# Patient Record
Sex: Female | Born: 1945 | Race: Black or African American | Hispanic: No | Marital: Single | Smoking: Never smoker
Health system: Southern US, Community
[De-identification: ages and names within clinical notes are randomized; demographics above are authoritative.]

## PROBLEM LIST (undated history)

## (undated) DIAGNOSIS — M1990 Unspecified osteoarthritis, unspecified site: Secondary | ICD-10-CM

## (undated) DIAGNOSIS — Z803 Family history of malignant neoplasm of breast: Secondary | ICD-10-CM

## (undated) DIAGNOSIS — D649 Anemia, unspecified: Secondary | ICD-10-CM

## (undated) DIAGNOSIS — E785 Hyperlipidemia, unspecified: Secondary | ICD-10-CM

## (undated) DIAGNOSIS — C801 Malignant (primary) neoplasm, unspecified: Secondary | ICD-10-CM

## (undated) DIAGNOSIS — Z853 Personal history of malignant neoplasm of breast: Secondary | ICD-10-CM

## (undated) DIAGNOSIS — C50919 Malignant neoplasm of unspecified site of unspecified female breast: Secondary | ICD-10-CM

## (undated) DIAGNOSIS — I10 Essential (primary) hypertension: Secondary | ICD-10-CM

## (undated) HISTORY — DX: Malignant (primary) neoplasm, unspecified: C80.1

## (undated) HISTORY — DX: Personal history of malignant neoplasm of breast: Z85.3

## (undated) HISTORY — DX: Hyperlipidemia, unspecified: E78.5

## (undated) HISTORY — PX: MASTECTOMY: SHX3

## (undated) HISTORY — DX: Essential (primary) hypertension: I10

## (undated) HISTORY — DX: Anemia, unspecified: D64.9

## (undated) HISTORY — PX: CYSTECTOMY: SUR359

## (undated) HISTORY — DX: Family history of malignant neoplasm of breast: Z80.3

## (undated) HISTORY — DX: Unspecified osteoarthritis, unspecified site: M19.90

---

## 1997-01-17 DIAGNOSIS — C801 Malignant (primary) neoplasm, unspecified: Secondary | ICD-10-CM

## 1997-01-17 HISTORY — DX: Malignant (primary) neoplasm, unspecified: C80.1

## 1997-01-17 HISTORY — PX: BREAST SURGERY: SHX581

## 2016-01-04 ENCOUNTER — Other Ambulatory Visit: Payer: Self-pay | Admitting: Physician Assistant

## 2016-03-29 ENCOUNTER — Ambulatory Visit: Payer: Medicare HMO | Attending: Orthopedic Surgery | Admitting: Physical Therapy

## 2016-03-29 DIAGNOSIS — M25632 Stiffness of left wrist, not elsewhere classified: Secondary | ICD-10-CM | POA: Insufficient documentation

## 2016-03-29 DIAGNOSIS — M25532 Pain in left wrist: Secondary | ICD-10-CM | POA: Diagnosis not present

## 2016-03-29 NOTE — Therapy (Signed)
[facility] [place], [place], [address] Phone: [phone]   Fax:  [phone]  Physical Therapy Evaluation  Patient Details  Name: Jennifer Cowan MRN: [id number] Date of Birth: 1945/12/15 Referring Provider: Gildardo Cranker MD.  Encounter Date: 03/29/2016      PT End of Session - 03/29/16 1243    Visit Number 1   Number of Visits 16   Date for PT Re-Evaluation 05/28/16   PT Start Time 2505   PT Stop Time 1123   PT Time Calculation (min) 47 min   Activity Tolerance Patient tolerated treatment well   Behavior During Therapy [facility] for tasks assessed/performed      No past medical history on file.  No past surgical history on file.  There were no vitals filed for this visit.       Subjective Assessment - 03/29/16 1231    Patient Stated Goals Use my left wrist without pain.            [facility] PT Assessment - 03/29/16 0001      Assessment   Medical Diagnosis Left wrist fracture.   Referring Provider Gildardo Cranker MD.   Onset Date/Surgical Date --  December 2017.     Precautions   Precautions None     Restrictions   Weight Bearing Restrictions No     Balance Screen   Has the patient fallen in the past 6 months Yes   How many times? --  1.   Has the patient had a decrease in activity level because of a fear of falling?  No   Is the patient reluctant to leave their home because of a fear of falling?  No     Home Environment   Living Environment Private residence     Prior Function   Level of Independence Independent     Observation/Other Assessments-Edema    Edema --  Circumferential palm measurement 1 cm > on rt than lt.     ROM / Strength   AROM / PROM / Strength AROM;Strength     AROM   Overall AROM Comments Left active wrist flexion and extension= 5 degrees and left forearm supination= 60 degrees.     Strength   Overall Strength Comments left grip strength= 2.5# and right= 40#.      Palpation   Palpation comment Tender to palpation left distal wrist dorsal and volar surface.     Ambulation/Gait   Gait Comments WNL.                   [facility] Adult PT Treatment/Exercise - 03/29/16 0001      Modalities   Modalities Electrical Stimulation     Electrical Stimulation   Electrical Stimulation Location lft distal wrist volar/dorsal surface.   Electrical Stimulation Action IFC at 80-150 Hz x 15 minutes.   Electrical Stimulation Goals Pain                  PT Short Term Goals - 03/29/16 1247      PT SHORT TERM GOAL #1   Title STG's=LTG's.           PT Long Term Goals - 03/29/16 1247      PT LONG TERM GOAL #1   Title Independent with a HEP.   Time 8   Period Weeks   Status New     PT LONG TERM GOAL #2   Title Active left wrist flexion and extension= 45 degrees.  Time 8   Period Weeks   Status New     PT LONG TERM GOAL #3   Title Left forearm supination actively to 70 degrees.   Time 8   Period Weeks   Status New     PT LONG TERM GOAL #4   Title Patient carry 5# x 500 feet with her left hand with pain not > 3/10.   Time 8   Period Weeks   Status New     PT LONG TERM GOAL #5   Title Patient perform ADL's with pain not > 3/10.   Time 8   Period Weeks   Status New               Plan - 03/29/16 1241    Clinical Impression Statement The patient presents with a high left wrist pain-level.  She demonstrates significant losses of left wrist ROM and strength.  Limitations impair functional use of her left UE.  Patient will benefit from skilled physical therapy.   Rehab Potential Good   PT Frequency 2x / week   PT Duration 8 weeks      Patient will benefit from skilled therapeutic intervention in order to improve the following deficits and impairments:  Pain, Decreased activity tolerance, Decreased range of motion, Decreased strength  Visit Diagnosis: Pain in left wrist - Plan: PT plan of care  cert/re-cert  Stiffness of left wrist, not elsewhere classified - Plan: PT plan of care cert/re-cert      G-Codes - 07/12/92 1246    Functional Assessment Tool Used (Outpatient Only) FOTO...49% limitation.   Functional Limitation Carrying, moving and handling objects   Carrying, Moving and Handling Objects Current Status [phone]) At least 40 percent but less than 60 percent impaired, limited or restricted   Carrying, Moving and Handling Objects Goal Status ([id number]) At least 20 percent but less than 40 percent impaired, limited or restricted       Problem List There are no active problems to display for this patient.   Saoirse Legere, [place] MPT 03/29/2016, 12:51 PM  [facility] [address] [place], [place], [address] Phone: [phone]   Fax:  [phone]  Name: Jennifer Cowan MRN: [id number] Date of Birth: 1945-03-09

## 2016-03-31 ENCOUNTER — Ambulatory Visit: Payer: Medicare HMO | Admitting: Physical Therapy

## 2016-03-31 DIAGNOSIS — M25532 Pain in left wrist: Secondary | ICD-10-CM | POA: Diagnosis not present

## 2016-03-31 DIAGNOSIS — M25632 Stiffness of left wrist, not elsewhere classified: Secondary | ICD-10-CM

## 2016-03-31 NOTE — Therapy (Signed)
[facility] [place], [place], [address] Phone: [phone]   Fax:  [phone]  Physical Therapy Treatment  Patient Details  Name: Jennifer Cowan MRN: [id number] Date of Birth: [birth year] Referring Provider: Gildardo Cranker MD.  Encounter Date: 03/31/2016      PT End of Session - 03/31/16 1117    Visit Number 2   Number of Visits 16   Date for PT Re-Evaluation 05/28/16   PT Start Time 7096   PT Stop Time 1200   PT Time Calculation (min) 44 min   Activity Tolerance Patient tolerated treatment well   Behavior During Therapy [facility] for tasks assessed/performed      No past medical history on file.  No past surgical history on file.  There were no vitals filed for this visit.      Subjective Assessment - 03/31/16 1113    Subjective Reports stiffness especially in the morning from wrist into palm region of hand.   Patient Stated Goals Use my left wrist without pain.   Currently in Pain? Yes   Pain Score 8    Pain Location Wrist   Pain Orientation Left   Pain Type Acute pain   Pain Onset More than a month ago            [facility] PT Assessment - 03/31/16 0001      Assessment   Medical Diagnosis Left wrist fracture.   Next MD Visit 04/2016     Precautions   Precautions None     Restrictions   Weight Bearing Restrictions No     Observation/Other Assessments-Edema    Edema Circumferential     Circumferential Edema   Circumferential - Right 17 cm   Circumferential - Left  21 cm                     [facility] Adult PT Treatment/Exercise - 03/31/16 0001      Exercises   Exercises Hand;Wrist     Hand Exercises   Thumb Opposition x30 reps   Digit Abduction/Adduction x30 reps   Other Hand Exercises L red web grip x20 reps, L clothespin pinch x30 reps, gray ball L hand squeeze x30 reps   Other Hand Exercises Yellow digitizer x30 reps, L claw grip x20 reps, towel pick up x30 reps, towel pinch x20  reps each     Wrist Exercises   Forearm Supination AROM;Left;20 reps;Seated;Bar weights/barbell   Forearm Pronation AROM;Left;20 reps;Seated;Bar weights/barbell   Wrist Flexion AROM;Left;20 reps;Seated   Wrist Extension AROM;Left;20 reps;Seated   Wrist Radial Deviation AROM;Left;20 reps;Seated   Wrist Ulnar Deviation AROM;Left;20 reps;Seated   Other wrist exercises L wrist extensors, flexors stretch in sitting 3x30 sec each     Modalities   Modalities Cryotherapy;Electrical Stimulation     Cryotherapy   Number Minutes Cryotherapy 15 Minutes   Cryotherapy Location Hand;Wrist   Type of Cryotherapy Ice pack     Electrical Stimulation   Electrical Stimulation Location L dorsal wrist and hand surface   Electrical Stimulation Action Pre-Mod   Electrical Stimulation Parameters 80-150 hz x15 min   Electrical Stimulation Goals Pain;Edema                  PT Short Term Goals - 03/29/16 1247      PT SHORT TERM GOAL #1   Title STG's=LTG's.           PT Long Term Goals - 03/29/16 1247  PT LONG TERM GOAL #1   Title Independent with a HEP.   Time 8   Period Weeks   Status New     PT LONG TERM GOAL #2   Title Active left wrist flexion and extension= 45 degrees.   Time 8   Period Weeks   Status New     PT LONG TERM GOAL #3   Title Left forearm supination actively to 70 degrees.   Time 8   Period Weeks   Status New     PT LONG TERM GOAL #4   Title Patient carry 5# x 500 feet with her left hand with pain not > 3/10.   Time 8   Period Weeks   Status New     PT LONG TERM GOAL #5   Title Patient perform ADL's with pain not > 3/10.   Time 8   Period Weeks   Status New               Plan - 03/31/16 1147    Clinical Impression Statement Patient presented in clinic with continued L wrist and hand discomfort that was reported as stiffness that is worse in the morning. Patient experienced difficulty with L wrist extension, pronation, demonstrated great  grip deficits as well as inability to fully flex all phalange joints during grip. Patient unable to pickup or carry objects for a long period secondary to increased pain. Very visible LUE edema observed as well as edema present from L palm region as well as into proximal forearm region. Patient completed exercises although she intermittantly reported L wrist pain along posteriodistal region of ulna where she states fracture was. Patient able to recite exercises that were mentioned by PT during evaluation and states that she was given theraputty for griipping exercises. 4 cm difference in circumferential edema of the B wrist with L being greater than R. Normal modalities response noted following removal of the modalities for pain/edema. No goals were met in today's treatment as this was only patient's second therapy visit. Encouraged patient to use ice pack at home for 10-15 minutes 2-3 times per day to reduce inflammation and to bring in brace or sleeve that she was given.   Rehab Potential Good   PT Frequency 2x / week   PT Duration 8 weeks   Consulted and Agree with Plan of Care Patient      Patient will benefit from skilled therapeutic intervention in order to improve the following deficits and impairments:  Pain, Decreased activity tolerance, Decreased range of motion, Decreased strength  Visit Diagnosis: Pain in left wrist  Stiffness of left wrist, not elsewhere classified     Problem List There are no active problems to display for this patient.   Wynelle Fanny, PTA 03/31/2016, 12:04 PM  [facility] [address] [place], [place], [address] Phone: [phone]   Fax:  [phone]  Name: Jennifer Cowan MRN: [id number] Date of Birth: December 10, 1945

## 2016-04-05 ENCOUNTER — Ambulatory Visit: Payer: Medicare HMO | Admitting: Physical Therapy

## 2016-04-05 ENCOUNTER — Encounter: Payer: Self-pay | Admitting: Physical Therapy

## 2016-04-05 DIAGNOSIS — M25532 Pain in left wrist: Secondary | ICD-10-CM

## 2016-04-05 DIAGNOSIS — M25632 Stiffness of left wrist, not elsewhere classified: Secondary | ICD-10-CM

## 2016-04-05 NOTE — Therapy (Signed)
[facility] [place], [place], [address] Phone: [phone]   Fax:  [phone]  Physical Therapy Treatment  Patient Details  Name: Jennifer Cowan MRN: [id number] Date of Birth: 10/19/1945 Referring Provider: Gildardo Cranker MD.  Encounter Date: 04/05/2016      PT End of Session - 04/05/16 1251    Visit Number 3   Number of Visits 16   Date for PT Re-Evaluation 05/28/16   PT Start Time 1225   PT Stop Time 1310   PT Time Calculation (min) 45 min   Activity Tolerance Patient tolerated treatment well   Behavior During Therapy [facility] for tasks assessed/performed      Past Medical History:  Diagnosis Date  . Arthritis   . Cancer ([place])   . Hypertension     Past Surgical History:  Procedure Laterality Date  . BREAST SURGERY  1999   Masectomy- left    There were no vitals filed for this visit.      Subjective Assessment - 04/05/16 1244    Subjective Patient arriving to therapy complaining of 8/10 pain in left wrist in the palm and lateral wrist.    Limitations House hold activities   Patient Stated Goals Use my left wrist without pain.   Currently in Pain? Yes   Pain Score 8    Pain Location Wrist   Pain Orientation Left   Pain Descriptors / Indicators Aching;Throbbing   Pain Type Acute pain   Pain Onset More than a month ago   Pain Frequency Constant   Aggravating Factors  moving the wrist   Pain Relieving Factors resting, ice pack            [facility] PT Assessment - 04/05/16 0001      Assessment   Medical Diagnosis Left wrist fracture.   Next MD Visit 04/2016     Precautions   Precautions None     Restrictions   Weight Bearing Restrictions No     Circumferential Edema   Circumferential - Left  23 cm                     [facility] Adult PT Treatment/Exercise - 04/05/16 0001      Exercises   Exercises Hand;Wrist     Hand Exercises   Other Hand Exercises using yellow theraputty to  make "biscuits" to elicit all wrist AROM.    Other Hand Exercises velcro rollers, pronation/supination with hammer     Wrist Exercises   Forearm Supination AROM;Left;20 reps;Seated;Bar weights/barbell   Forearm Pronation AROM;Left;20 reps;Seated;Bar weights/barbell   Wrist Flexion AROM;Left;20 reps;Seated   Wrist Extension AROM;Left;20 reps;Seated   Wrist Radial Deviation AROM;Left;20 reps;Seated   Wrist Ulnar Deviation AROM;Left;20 reps;Seated     Modalities   Modalities Cryotherapy;Electrical Stimulation     Cryotherapy   Number Minutes Cryotherapy 15 Minutes   Cryotherapy Location Hand;Wrist   Type of Cryotherapy Ice pack     Electrical Stimulation   Electrical Stimulation Location L dorsal wrist and hand surface   Electrical Stimulation Action IFC   Electrical Stimulation Parameters 80-150 Hz x 15 minutes   Electrical Stimulation Goals Edema;Pain     Manual Therapy   Manual Therapy Soft tissue mobilization   Manual therapy comments Retrograde massage for edema                 PT Education - 04/05/16 1250    Education provided Yes   Education Details using putty to simulate  making biscuits/cookies for exercise   Person(s) Educated Patient   Methods Explanation;Demonstration;Verbal cues   Comprehension Verbalized understanding;Returned demonstration          PT Short Term Goals - 03/29/16 1247      PT SHORT TERM GOAL #1   Title STG's=LTG's.           PT Long Term Goals - 04/05/16 1252      PT LONG TERM GOAL #1   Title Independent with a HEP.   Time 8   Period Weeks   Status New     PT LONG TERM GOAL #2   Title Active left wrist flexion and extension= 45 degrees.   Time 8   Period Weeks   Status New     PT LONG TERM GOAL #3   Title Left forearm supination actively to 70 degrees.   Period Weeks   Status New     PT LONG TERM GOAL #4   Time 8   Status New     PT LONG TERM GOAL #5   Title Patient perform ADL's with pain not > 3/10.    Status New               Plan - 04/05/16 1424    Clinical Impression Statement Patient arriving to therapy complaining of 8/10 left wrist pain with increased swelling from left elbow to fingers. Pt arriving with her left arm brace. Pt with limited lateral grip in 4th and 5th digits. Pt tolerating therapy well, Retrograde massage for edema performed. IFC tolerated well.  Medium Edema Glove ordered for pt. Pt with circumferential edema at 23 cm today in left wrist. At end of session pt reporting 6/10 pain in left wrist. Continue skilled PT to progress pt's functinoal mobility.    Rehab Potential Good   PT Frequency 2x / week   PT Duration 8 weeks   PT Treatment/Interventions ADLs/Self Care Home Management;Cryotherapy;Electrical Stimulation;Moist Heat;Ultrasound;Patient/family education;Therapeutic exercise;Therapeutic activities;Manual techniques;Passive range of motion   PT Next Visit Plan Left wrist PROM and passive forearm supination; gripping; AAROM; AROM.   Progress to strengthening.  Modalites as needed.   PT Home Exercise Plan Instructd pt to use her yellow thera-putty to minic making biscuits for wrist and finger AROM   Consulted and Agree with Plan of Care Patient      Patient will benefit from skilled therapeutic intervention in order to improve the following deficits and impairments:  Pain, Decreased activity tolerance, Decreased range of motion, Decreased strength  Visit Diagnosis: Pain in left wrist  Stiffness of left wrist, not elsewhere classified     Problem List There are no active problems to display for this patient.   Oretha Caprice, MPT 04/05/2016, 2:30 PM  [facility] [place], [place], [address] Phone: [phone]   Fax:  [phone]  Name: Jennifer Cowan MRN: [id number] Date of Birth: 25-Mar-1945

## 2016-04-11 ENCOUNTER — Encounter: Payer: Commercial Managed Care - HMO | Admitting: Physical Therapy

## 2016-04-12 ENCOUNTER — Ambulatory Visit: Payer: Medicare HMO | Admitting: *Deleted

## 2016-04-12 DIAGNOSIS — M25632 Stiffness of left wrist, not elsewhere classified: Secondary | ICD-10-CM

## 2016-04-12 DIAGNOSIS — M25532 Pain in left wrist: Secondary | ICD-10-CM

## 2016-04-12 NOTE — Therapy (Signed)
[facility] [place], [place], [address] Phone: [phone]   Fax:  [phone]  Physical Therapy Treatment  Patient Details  Name: Jennifer Cowan MRN: [id number] Date of Birth: 1945-04-06 Referring Provider: Gildardo Cranker MD.  Encounter Date: 04/12/2016      PT End of Session - 04/12/16 1544    Visit Number 4   Number of Visits 16   Date for PT Re-Evaluation 05/28/16   PT Start Time 3016   PT Stop Time 1435   PT Time Calculation (min) 50 min   Activity Tolerance Patient tolerated treatment well   Behavior During Therapy [facility] for tasks assessed/performed      Past Medical History:  Diagnosis Date  . Arthritis   . Cancer ([place])   . Hypertension     Past Surgical History:  Procedure Laterality Date  . BREAST SURGERY  1999   Masectomy- left    There were no vitals filed for this visit.      Subjective Assessment - 04/12/16 1351    Subjective Patient arriving to therapy complaining of 6/10 pain in left wrist in the palm and lateral wrist.    Limitations House hold activities   Patient Stated Goals Use my left wrist without pain.   Currently in Pain? Yes   Pain Score 6    Pain Location Wrist   Pain Orientation Left   Pain Descriptors / Indicators Aching   Pain Onset More than a month ago   Pain Frequency Constant                         [facility] Adult PT Treatment/Exercise - 04/12/16 0001      Exercises   Exercises Hand;Wrist     Hand Exercises   Thumb Opposition x10 reps   clothes pin   Digit Abduction/Adduction x30 reps   Other Hand Exercises using yellow theraputty to make "biscuits" to elicit all wrist AROM.    Other Hand Exercises --     Wrist Exercises   Forearm Supination AROM;Left;20 reps;Seated;Bar weights/barbell  red bar   Forearm Pronation AROM;Left;20 reps;Seated;Bar weights/barbell  red bar x 20   Wrist Flexion AROM;Left;20 reps;Seated  20 red bar   Wrist Extension  AROM;Left;20 reps;Seated  red bar    Wrist Radial Deviation AROM;Left;20 reps;Seated   Wrist Ulnar Deviation AROM;Left;20 reps;Seated     Modalities   Modalities Cryotherapy;Electrical Stimulation     Cryotherapy   Number Minutes Cryotherapy 15 Minutes   Cryotherapy Location Hand;Wrist   Type of Cryotherapy Ice pack     Electrical Stimulation   Electrical Stimulation Location L dorsal wrist and hand surface IFC 80-'150hz'$  x 15 mins   Electrical Stimulation Goals Edema;Pain     Manual Therapy   Manual Therapy Soft tissue mobilization;Passive ROM   Manual therapy comments Retrograde massage for edema    Passive ROM AAROM for wrist flexion and extension                  PT Short Term Goals - 03/29/16 1247      PT SHORT TERM GOAL #1   Title STG's=LTG's.           PT Long Term Goals - 04/05/16 1252      PT LONG TERM GOAL #1   Title Independent with a HEP.   Time 8   Period Weeks   Status New     PT LONG TERM GOAL #2  Title Active left wrist flexion and extension= 45 degrees.   Time 8   Period Weeks   Status New     PT LONG TERM GOAL #3   Title Left forearm supination actively to 70 degrees.   Period Weeks   Status New     PT LONG TERM GOAL #4   Time 8   Status New     PT LONG TERM GOAL #5   Title Patient perform ADL's with pain not > 3/10.   Status New               Plan - 04/12/16 1548    Clinical Impression Statement Pt arrived to clinic with LT wrist still painful and swollen. She continues to perform her HEP, but wrist remains stiff.  She was able to tolerate therex fairly well again, but ROM is still fairly limited especially Wrist flexion and extensioin. Normal modality response   PT Treatment/Interventions ADLs/Self Care Home Management;Cryotherapy;Electrical Stimulation;Moist Heat;Ultrasound;Patient/family education;Therapeutic exercise;Therapeutic activities;Manual techniques;Passive range of motion   PT Next Visit Plan Left wrist  PROM and passive forearm supination; gripping; AAROM; AROM.   Progress to strengthening.  Modalites as needed.   PT Home Exercise Plan Instructd pt to use her yellow thera-putty to minic making biscuits for wrist and finger AROM   Consulted and Agree with Plan of Care Patient      Patient will benefit from skilled therapeutic intervention in order to improve the following deficits and impairments:  Pain, Decreased activity tolerance, Decreased range of motion, Decreased strength  Visit Diagnosis: Pain in left wrist  Stiffness of left wrist, not elsewhere classified     Problem List There are no active problems to display for this patient.   Ethelreda Sukhu,CHRIS, PTA 04/12/2016, 3:57 PM  [facility] [place], [place], [address] Phone: [phone]   Fax:  [phone]  Name: Jennifer Cowan MRN: [id number] Date of Birth: 03-17-1945

## 2016-04-21 ENCOUNTER — Ambulatory Visit: Payer: Medicare HMO | Attending: Orthopedic Surgery | Admitting: Physical Therapy

## 2016-04-21 ENCOUNTER — Encounter: Payer: Self-pay | Admitting: Physical Therapy

## 2016-04-21 DIAGNOSIS — M25632 Stiffness of left wrist, not elsewhere classified: Secondary | ICD-10-CM | POA: Diagnosis present

## 2016-04-21 DIAGNOSIS — M25532 Pain in left wrist: Secondary | ICD-10-CM | POA: Diagnosis not present

## 2016-04-21 DIAGNOSIS — R600 Localized edema: Secondary | ICD-10-CM | POA: Diagnosis present

## 2016-04-21 NOTE — Therapy (Signed)
[facility] [place], [place], [address] Phone: [phone]   Fax:  [phone]  Physical Therapy Treatment  Patient Details  Name: Jennifer Cowan MRN: [id number] Date of Birth: 06-01-1945 Referring Provider: Gildardo Cranker MD.  Encounter Date: 04/21/2016      PT End of Session - 04/21/16 1133    Visit Number 5   Number of Visits 16   Date for PT Re-Evaluation 05/28/16   PT Start Time 1115   PT Stop Time 1200   PT Time Calculation (min) 45 min   Activity Tolerance Patient tolerated treatment well   Behavior During Therapy [facility] for tasks assessed/performed      Past Medical History:  Diagnosis Date  . Arthritis   . Cancer ([employer])   . Hypertension     Past Surgical History:  Procedure Laterality Date  . BREAST SURGERY  1999   Masectomy- left    There were no vitals filed for this visit.      Subjective Assessment - 04/21/16 1131    Subjective Patient arriving to therapy complaining of 6/10 pain in left wrist and hand.    Limitations House hold activities   Patient Stated Goals Use my left wrist without pain.   Currently in Pain? Yes   Pain Score 6    Pain Location Hand   Pain Orientation Left   Pain Descriptors / Indicators Aching   Pain Type Acute pain   Pain Onset More than a month ago   Pain Frequency Constant   Aggravating Factors  moving with hand/wrist   Pain Relieving Factors resting, ice pack            [facility] PT Assessment - 04/21/16 0001      Assessment   Medical Diagnosis Left wrist fracture.   Next MD Visit 05/03/16     Precautions   Precautions None     Restrictions   Weight Bearing Restrictions No     Circumferential Edema   Circumferential - Left  24 cm  22cm after elevation/ice, 29cm 8 inches above wrist joint.                     [facility] Adult PT Treatment/Exercise - 04/21/16 0001      Exercises   Exercises Hand;Wrist     Hand Exercises   Thumb Opposition  x10 reps   clothes pin   Digit Abduction/Adduction x30 reps   Other Hand Exercises using yellow theraputty to make "biscuits" to elicit all wrist AROM.    Other Hand Exercises velcro rollers used for pronation and supination     Wrist Exercises   Forearm Supination AROM;Left;20 reps;Seated;Bar weights/barbell  red bar   Forearm Pronation AROM;Left;20 reps;Seated;Bar weights/barbell  red bar x 20   Wrist Flexion AROM;Left;20 reps;Seated  20 red bar   Wrist Extension AROM;Left;20 reps;Seated  red bar    Wrist Radial Deviation AROM;Left;20 reps;Seated   Wrist Ulnar Deviation AROM;Left;20 reps;Seated     Modalities   Modalities Cryotherapy;Electrical Stimulation     Cryotherapy   Number Minutes Cryotherapy 15 Minutes   Cryotherapy Location Hand;Wrist   Type of Cryotherapy Ice pack     Electrical Stimulation   Electrical Stimulation Location L dorsal wrist and hand surface IFC 80-'150hz'  x 15 mins  supine with left hand above the heart    Electrical Stimulation Goals Edema;Pain     Manual Therapy   Manual Therapy Soft tissue mobilization;Passive ROM   Manual therapy comments  Retrograde massage for edema   using Biofreeze   Passive ROM AAROM for wrist flexion and extension                PT Education - 04/21/16 1132    Education Details reviewed HEP with putty   Person(s) Educated Patient   Methods Explanation;Demonstration   Comprehension Verbalized understanding;Returned demonstration          PT Short Term Goals - 03/29/16 1247      PT SHORT TERM GOAL #1   Title STG's=LTG's.           PT Long Term Goals - 04/21/16 1138      PT LONG TERM GOAL #1   Title Independent with a HEP.   Period Weeks   Status On-going     PT LONG TERM GOAL #2   Title Active left wrist flexion and extension= 45 degrees.   Time 8   Period Weeks   Status New     PT LONG TERM GOAL #3   Title Left forearm supination actively to 70 degrees.     PT LONG TERM GOAL #4    Title Patient carry 5# x 500 feet with her left hand with pain not > 3/10.   Time 8   Period Weeks   Status New     PT LONG TERM GOAL #5   Title Patient perform ADL's with pain not > 3/10.   Baseline Patient currently reporting 5-6/10 pain (04/21/16)               Plan - 04/21/16 1133    Clinical Impression Statement Patient arriving to clinic today complaining of 6/10 left wrist/hand pain. Pt with left hand/wrist swelling. Pt was measured at end of session after ice and elevation with a 2 cm decrease in swelling since last session in wrist/hand. However pt showing up today with increased forarm swelling. Pt was measured 8 inches above left wrist joint at 29cm. Swelling also noted into distal biceps.  Pt tolerated exercises well and reported less pain at end of session. No increased pain during session. Continue with skilled PT. no new goals met this session.    Rehab Potential Good   PT Frequency 2x / week   PT Duration 8 weeks   PT Treatment/Interventions ADLs/Self Care Home Management;Cryotherapy;Electrical Stimulation;Moist Heat;Ultrasound;Patient/family education;Therapeutic exercise;Therapeutic activities;Manual techniques;Passive range of motion   PT Next Visit Plan Left wrist PROM and passive forearm supination; gripping; AAROM; AROM.   Progress to strengthening.  Modalites as needed.   PT Home Exercise Plan Instructd pt to use her yellow thera-putty to minic making biscuits for wrist and finger AROM   Consulted and Agree with Plan of Care Patient      Patient will benefit from skilled therapeutic intervention in order to improve the following deficits and impairments:  Pain, Decreased activity tolerance, Decreased range of motion, Decreased strength  Visit Diagnosis: Pain in left wrist  Stiffness of left wrist, not elsewhere classified     Problem List There are no active problems to display for this patient.   Oretha Caprice, MPT  04/21/2016, 12:21 PM  [facility] [address] [place], [place], [address] Phone: [phone]   Fax:  [phone]  Name: ANINA SCHNAKE MRN: [id number] Date of Birth: 03/23/1945

## 2016-04-26 ENCOUNTER — Encounter: Payer: Self-pay | Admitting: Physical Therapy

## 2016-04-26 ENCOUNTER — Ambulatory Visit: Payer: Medicare HMO | Admitting: Physical Therapy

## 2016-04-26 DIAGNOSIS — M25632 Stiffness of left wrist, not elsewhere classified: Secondary | ICD-10-CM

## 2016-04-26 DIAGNOSIS — M25532 Pain in left wrist: Secondary | ICD-10-CM

## 2016-04-26 NOTE — Therapy (Signed)
[facility] [place], [place], [address] Phone: [phone]   Fax:  [phone]  Physical Therapy Treatment  Patient Details  Name: Jennifer Cowan MRN: [id number] Date of Birth: 01-May-1945 Referring Provider: Gildardo Cranker MD.  Encounter Date: 04/26/2016      PT End of Session - 04/26/16 1202    Visit Number 6   Number of Visits 16   Date for PT Re-Evaluation 05/28/16   PT Start Time 1115   PT Stop Time 1200   PT Time Calculation (min) 45 min   Activity Tolerance Patient tolerated treatment well   Behavior During Therapy [facility] for tasks assessed/performed      Past Medical History:  Diagnosis Date  . Arthritis   . Cancer ([place])   . Hypertension     Past Surgical History:  Procedure Laterality Date  . BREAST SURGERY  1999   Masectomy- left    There were no vitals filed for this visit.      Subjective Assessment - 04/26/16 1200    Subjective Patient arriving to therapy complaining of 7/10 pain in left wrist and hand.   Limitations House hold activities   Patient Stated Goals Use my left wrist without pain.   Currently in Pain? Yes   Pain Score 7    Pain Location Hand   Pain Descriptors / Indicators Aching   Pain Type Acute pain   Pain Onset More than a month ago   Aggravating Factors  moving hand and wrist   Pain Relieving Factors resting, ice pack            [facility] PT Assessment - 04/26/16 0001      Assessment   Medical Diagnosis Left wrist fracture.   Next MD Visit 05/03/16     Precautions   Precautions None     Restrictions   Weight Bearing Restrictions No     Circumferential Edema   Circumferential - Left  22cm  8 inches above wrist joint: 29 cm     AROM   Overall AROM Comments left wrist supination 64 degrees                     [facility] Adult PT Treatment/Exercise - 04/26/16 0001      Exercises   Exercises Hand;Wrist     Hand Exercises   Other Hand Exercises using  yellow theraputty to make "biscuits" to elicit all wrist AROM.  clothes pin pinches with each finger x 10.    Other Hand Exercises velcro rollers used for pronation and supination     Wrist Exercises   Forearm Supination AROM;Left;20 reps;Seated;Bar weights/barbell  red bar   Forearm Pronation AROM;Left;20 reps;Seated;Bar weights/barbell  red bar x 20   Wrist Flexion AROM;Left;20 reps;Seated  20 red bar   Wrist Extension AROM;Left;20 reps;Seated  red bar    Wrist Radial Deviation AROM;Left;20 reps;Seated   Wrist Ulnar Deviation AROM;Left;20 reps;Seated     Modalities   Modalities Cryotherapy;Electrical Stimulation     Cryotherapy   Number Minutes Cryotherapy 15 Minutes   Cryotherapy Location Hand;Wrist     Electrical Stimulation   Electrical Stimulation Location L dorsal wrist and hand surface IFC 80-'150hz'  x 15 mins  supine with left hand above the heart    Electrical Stimulation Goals Edema;Pain     Manual Therapy   Manual Therapy Soft tissue mobilization;Passive ROM   Manual therapy comments Retrograde massage for edema   using Biofreeze   Passive ROM  AAROM for wrist flexion and extension                PT Education - 04/26/16 1201    Education provided Yes   Education Details RICE    Person(s) Educated Patient   Methods Explanation   Comprehension Verbalized understanding          PT Short Term Goals - 03/29/16 1247      PT SHORT TERM GOAL #1   Title STG's=LTG's.           PT Long Term Goals - 04/26/16 1204      PT LONG TERM GOAL #1   Title Independent with a HEP.   Time 8   Period Weeks   Status On-going     PT LONG TERM GOAL #2   Title Active left wrist flexion and extension= 45 degrees.   Time 8   Period Weeks   Status New     PT LONG TERM GOAL #3   Title Left forearm supination actively to 70 degrees.   Time 8   Period Weeks   Status New     PT LONG TERM GOAL #4   Title Patient carry 5# x 500 feet with her left hand with pain  not > 3/10.   Status New     PT LONG TERM GOAL #5   Title Patient perform ADL's with pain not > 3/10.   Baseline Patient currently reporting 5-6/10 pain (04/21/16)   Time 8   Period Weeks   Status New               Plan - 04/26/16 1202    Clinical Impression Statement Patient arriving today complaining of 7/10 left wrist/hand pain. Swelling still noted in left wrist/hand and forarm. Pt with a slight decline in circumference in wrist measurement today. Pt's forarm measurement was the same as last session. Pt tolerated exercises well. Continue with skilled PT to progress toward goals met.    Rehab Potential Good   PT Frequency 2x / week   PT Duration 8 weeks   PT Treatment/Interventions ADLs/Self Care Home Management;Cryotherapy;Electrical Stimulation;Moist Heat;Ultrasound;Patient/family education;Therapeutic exercise;Therapeutic activities;Manual techniques;Passive range of motion   PT Next Visit Plan Left wrist PROM and passive forearm supination; gripping; AAROM; AROM.   Progress to strengthening.  Modalites as needed.   PT Home Exercise Plan Instructd pt to use her yellow thera-putty to minic making biscuits for wrist and finger AROM   Consulted and Agree with Plan of Care Patient      Patient will benefit from skilled therapeutic intervention in order to improve the following deficits and impairments:  Pain, Decreased activity tolerance, Decreased range of motion, Decreased strength  Visit Diagnosis: Stiffness of left wrist, not elsewhere classified  Pain in left wrist     Problem List There are no active problems to display for this patient.   Oretha Caprice, MPT 04/26/2016, 12:07 PM  [facility] [place], [place], [address] Phone: [phone]   Fax:  [phone]  Name: Jennifer Cowan MRN: [id number] Date of Birth: [birth year]

## 2016-04-29 ENCOUNTER — Ambulatory Visit: Payer: Medicare HMO | Admitting: Physical Therapy

## 2016-04-29 DIAGNOSIS — R600 Localized edema: Secondary | ICD-10-CM

## 2016-04-29 DIAGNOSIS — M25532 Pain in left wrist: Secondary | ICD-10-CM

## 2016-04-29 DIAGNOSIS — M25632 Stiffness of left wrist, not elsewhere classified: Secondary | ICD-10-CM

## 2016-04-29 NOTE — Therapy (Signed)
[facility] [place], [place], [address] Phone: [phone]   Fax:  [phone]  Physical Therapy Treatment  Patient Details  Name: Jennifer Cowan MRN: [id number] Date of Birth: 28-Mar-1945 Referring Provider: Gildardo Cranker MD.  Encounter Date: 04/29/2016      PT End of Session - 04/29/16 1025    Visit Number 8   Number of Visits 16   Date for PT Re-Evaluation 05/28/16   PT Start Time 0945   Activity Tolerance Patient tolerated treatment well   Behavior During Therapy [facility] for tasks assessed/performed      Past Medical History:  Diagnosis Date  . Arthritis   . Cancer ([place])   . Hypertension     Past Surgical History:  Procedure Laterality Date  . BREAST SURGERY  1999   Masectomy- left    There were no vitals filed for this visit.      Subjective Assessment - 04/29/16 0950    Subjective My wrist feels stiff.   Pain Score 6    Pain Location Hand   Pain Orientation Left   Pain Descriptors / Indicators Aching   Pain Onset More than a month ago                         [facility] Adult PT Treatment/Exercise - 04/29/16 0001      Exercises   Exercises Shoulder     Shoulder Exercises: ROM/Strengthening   UBE (Upper Arm Bike) 8 minutes at 90 RPM's.     Hand Exercises   Other Hand Exercises Red Power web x 2 minutes f.b 1# hammer for supination x 2 minutes.     Modalities   Modalities Cryotherapy;Electrical Stimulation     Cryotherapy   Number Minutes Cryotherapy 15 Minutes   Cryotherapy Location --  Left wrist.     Electrical Stimulation   Electrical Stimulation Location Left wrist.   Electrical Stimulation Action Constant IFC   Electrical Stimulation Parameters 80-150 Hz x 15 minutes.   Electrical Stimulation Goals Edema;Pain     Manual Therapy   Manual Therapy Passive ROM   Passive ROM PROM into left wrist flexion and extension x 12 minutes.                  PT Short  Term Goals - 03/29/16 1247      PT SHORT TERM GOAL #1   Title STG's=LTG's.           PT Long Term Goals - 04/26/16 1204      PT LONG TERM GOAL #1   Title Independent with a HEP.   Time 8   Period Weeks   Status On-going     PT LONG TERM GOAL #2   Title Active left wrist flexion and extension= 45 degrees.   Time 8   Period Weeks   Status New     PT LONG TERM GOAL #3   Title Left forearm supination actively to 70 degrees.   Time 8   Period Weeks   Status New     PT LONG TERM GOAL #4   Title Patient carry 5# x 500 feet with her left hand with pain not > 3/10.   Status New     PT LONG TERM GOAL #5   Title Patient perform ADL's with pain not > 3/10.   Baseline Patient currently reporting 5-6/10 pain (04/21/16)   Time 8   Period Weeks   Status  New               Plan - 04/29/16 1025    Clinical Impression Statement Patient did well with treatment today.  She continues to reports pain and stiff and persistent swelling.  She states she uses her edema glove inthe evening.   PT Next Visit Plan PROM.      Patient will benefit from skilled therapeutic intervention in order to improve the following deficits and impairments:     Visit Diagnosis: Localized edema  Stiffness of left wrist, not elsewhere classified  Pain in left wrist     Problem List There are no active problems to display for this patient.   Mirna Sutcliffe, [place] MPT 04/29/2016, 10:41 AM  [facility] [address] [place], [place], [address] Phone: [phone]   Fax:  [phone]  Name: DANIESHA DRIVER MRN: [id number] Date of Birth: 17-Dec-1945

## 2016-05-04 ENCOUNTER — Encounter: Payer: Commercial Managed Care - HMO | Admitting: Physical Therapy

## 2016-05-05 ENCOUNTER — Encounter: Payer: Self-pay | Admitting: Physical Therapy

## 2016-05-05 ENCOUNTER — Ambulatory Visit: Payer: Medicare HMO | Admitting: Physical Therapy

## 2016-05-05 DIAGNOSIS — M25632 Stiffness of left wrist, not elsewhere classified: Secondary | ICD-10-CM

## 2016-05-05 DIAGNOSIS — R600 Localized edema: Secondary | ICD-10-CM

## 2016-05-05 DIAGNOSIS — M25532 Pain in left wrist: Secondary | ICD-10-CM | POA: Diagnosis not present

## 2016-05-05 NOTE — Therapy (Signed)
[facility] [place], [place], [address] Phone: [phone]   Fax:  [phone]  Physical Therapy Treatment  Patient Details  Name: Jennifer Cowan MRN: [id number] Date of Birth: Aug 29, 1945 Referring Provider: Gildardo Cranker MD.  Encounter Date: 05/05/2016      PT End of Session - 05/05/16 0955    Visit Number 9   Number of Visits 16   Date for PT Re-Evaluation 05/28/16   PT Start Time 0945   PT Stop Time 1036   PT Time Calculation (min) 51 min   Activity Tolerance Patient tolerated treatment well   Behavior During Therapy [facility] for tasks assessed/performed      Past Medical History:  Diagnosis Date  . Arthritis   . Cancer ([place])   . Hypertension     Past Surgical History:  Procedure Laterality Date  . BREAST SURGERY  1999   Masectomy- left    There were no vitals filed for this visit.      Subjective Assessment - 05/05/16 0946    Subjective Reports that her wrist is still stiff but went to hand therapist recently.   Limitations House hold activities   Patient Stated Goals Use my left wrist without pain.   Currently in Pain? Yes   Pain Score 6    Pain Location Hand   Pain Orientation Left   Pain Descriptors / Indicators Other (Comment)  Stiffness   Pain Type Acute pain   Pain Onset More than a month ago            [facility] PT Assessment - 05/05/16 0001      Assessment   Medical Diagnosis Left wrist fracture.   Next MD Visit 05/2016     Precautions   Precautions None     Restrictions   Weight Bearing Restrictions No                     [facility] Adult PT Treatment/Exercise - 05/05/16 0001      Shoulder Exercises: ROM/Strengthening   UBE (Upper Arm Bike) 90 RPM x5 min     Hand Exercises   Theraputty - Flatten Orange x3 min   Theraputty - Roll Yellow x3 min   Theraputty - Grip Orange x3 min   Other Hand Exercises Red Power web x 2 minutes f.b 1# hammer for supination x 2 minutes.    Other Hand Exercises velcro rollers used for pronation and supination; making biscuits with yellow theraband      Wrist Exercises   Other wrist exercises AROM L wrist extension and flexion in arm wrestle position to assist with edema drainage x20 reps     Modalities   Modalities Cryotherapy;Electrical Stimulation     Cryotherapy   Number Minutes Cryotherapy 15 Minutes   Cryotherapy Location Hand;Wrist   Type of Cryotherapy Ice pack     Electrical Stimulation   Electrical Stimulation Location Left wrist.   Electrical Stimulation Action Pre-Mod   Electrical Stimulation Parameters 80-150 hz x15 min   Electrical Stimulation Goals Edema;Pain                  PT Short Term Goals - 03/29/16 1247      PT SHORT TERM GOAL #1   Title STG's=LTG's.           PT Long Term Goals - 05/05/16 1028      PT LONG TERM GOAL #1   Title Independent with a HEP.   Time  8   Period Weeks   Status Achieved     PT LONG TERM GOAL #2   Title Active left wrist flexion and extension= 45 degrees.   Time 8   Period Weeks   Status On-going     PT LONG TERM GOAL #3   Title Left forearm supination actively to 70 degrees.   Time 8   Period Weeks   Status On-going     PT LONG TERM GOAL #4   Title Patient carry 5# x 500 feet with her left hand with pain not > 3/10.   Status On-going     PT LONG TERM GOAL #5   Title Patient perform ADL's with pain not > 3/10.   Baseline Patient currently reporting 5-6/10 pain (04/21/16)   Time 8   Period Weeks   Status On-going               Plan - 05/05/16 1022    Clinical Impression Statement Patient arrived to treatment with continued L wrist and hand stiffness and continued edema throughout L hand/wrist/ and lower arm. L hand and wrist ROM and strengthening exercises completed with deficits in ROM noted with exercises. Due to lack of proper ROM L shoulder ROM used intermittantly especially noted with velcro board. AROM L wrist flexion and  extension completed in arm wrestling position to assist with edema drainage along with promoting ROM. Normal modalities response noted following removal of the modalities.   Rehab Potential Good   PT Frequency 2x / week   PT Duration 8 weeks   PT Treatment/Interventions ADLs/Self Care Home Management;Cryotherapy;Electrical Stimulation;Moist Heat;Ultrasound;Patient/family education;Therapeutic exercise;Therapeutic activities;Manual techniques;Passive range of motion   PT Next Visit Plan Continue with L wrist/hand ROM and strengthening with edema reduction techniques as well per MPT POC.   PT Home Exercise Plan Instructd pt to use her yellow thera-putty to minic making biscuits for wrist and finger AROM   Consulted and Agree with Plan of Care Patient      Patient will benefit from skilled therapeutic intervention in order to improve the following deficits and impairments:  Pain, Decreased activity tolerance, Decreased range of motion, Decreased strength  Visit Diagnosis: Localized edema  Stiffness of left wrist, not elsewhere classified  Pain in left wrist     Problem List There are no active problems to display for this patient.   Wynelle Fanny, PTA 05/05/2016, 10:42 AM  [facility] [address] [place], [place], [address] Phone: [phone]   Fax:  [phone]  Name: Jennifer Cowan MRN: [id number] Date of Birth: 1945/12/28

## 2016-05-09 ENCOUNTER — Encounter: Payer: Self-pay | Admitting: Physical Therapy

## 2016-05-09 ENCOUNTER — Ambulatory Visit: Payer: Medicare HMO | Admitting: Physical Therapy

## 2016-05-09 DIAGNOSIS — M25532 Pain in left wrist: Secondary | ICD-10-CM | POA: Diagnosis not present

## 2016-05-09 DIAGNOSIS — R600 Localized edema: Secondary | ICD-10-CM

## 2016-05-09 DIAGNOSIS — M25632 Stiffness of left wrist, not elsewhere classified: Secondary | ICD-10-CM

## 2016-05-09 NOTE — Therapy (Signed)
[facility] [place], [place], [address] Phone: [phone]   Fax:  [phone]  Physical Therapy Treatment  Patient Details  Name: Jennifer Cowan MRN: [id number] Date of Birth: May 23, 1945 Referring Provider: Gildardo Cranker MD.  Encounter Date: 05/09/2016      PT End of Session - 05/09/16 1037    Visit Number 10   Number of Visits 16   Date for PT Re-Evaluation 05/28/16   PT Start Time 4431   PT Stop Time 1118   PT Time Calculation (min) 46 min   Activity Tolerance Patient tolerated treatment well   Behavior During Therapy [facility] for tasks assessed/performed      Past Medical History:  Diagnosis Date  . Arthritis   . Cancer ([place])   . Hypertension     Past Surgical History:  Procedure Laterality Date  . BREAST SURGERY  1999   Masectomy- left    There were no vitals filed for this visit.      Subjective Assessment - 05/09/16 1035    Subjective Reports that she still has stiffness in RUE.   Limitations House hold activities   Patient Stated Goals Use my left wrist without pain.   Currently in Pain? Yes   Pain Score 6    Pain Location Hand   Pain Orientation Left   Pain Descriptors / Indicators Discomfort;Other (Comment)  Stiffness   Pain Type Acute pain   Pain Onset More than a month ago            [facility] PT Assessment - 05/09/16 0001      Assessment   Medical Diagnosis Left wrist fracture.   Next MD Visit 05/2016     Precautions   Precautions None     Restrictions   Weight Bearing Restrictions No                     [facility] Adult PT Treatment/Exercise - 05/09/16 0001      Shoulder Exercises: ROM/Strengthening   UBE (Upper Arm Bike) 90 RPM x6 min     Hand Exercises   Theraputty - Flatten Yellow included with pinching   Theraputty - Pinch Yellow x3 min   Digit Abduction/Adduction x30 reps   Digiticizer Red LUE x2 min   Other Hand Exercises Biscuits with yellow theraputty x5  min   Other Hand Exercises Grip gray ball x2 min     Wrist Exercises   Wrist Radial Deviation AROM;Left;20 reps;Seated   Wrist Ulnar Deviation AROM;Left;20 reps;Seated   Other wrist exercises L wrist extensors, flexors strengthening with 1# handweight x30 reps each     Modalities   Modalities Cryotherapy;Electrical Stimulation     Cryotherapy   Number Minutes Cryotherapy 15 Minutes   Cryotherapy Location Hand;Wrist   Type of Cryotherapy Ice pack     Electrical Stimulation   Electrical Stimulation Location Left wrist.   Electrical Stimulation Action Pre-Mod   Electrical Stimulation Parameters 80-150 hz x15 min   Electrical Stimulation Goals Edema;Pain                  PT Short Term Goals - 03/29/16 1247      PT SHORT TERM GOAL #1   Title STG's=LTG's.           PT Long Term Goals - 05/05/16 1028      PT LONG TERM GOAL #1   Title Independent with a HEP.   Time 8   Period Weeks  Status Achieved     PT LONG TERM GOAL #2   Title Active left wrist flexion and extension= 45 degrees.   Time 8   Period Weeks   Status On-going     PT LONG TERM GOAL #3   Title Left forearm supination actively to 70 degrees.   Time 8   Period Weeks   Status On-going     PT LONG TERM GOAL #4   Title Patient carry 5# x 500 feet with her left hand with pain not > 3/10.   Status On-going     PT LONG TERM GOAL #5   Title Patient perform ADL's with pain not > 3/10.   Baseline Patient currently reporting 5-6/10 pain (04/21/16)   Time 8   Period Weeks   Status On-going               Plan - 05/09/16 1152    Clinical Impression Statement Patient arrived to treatment with continued reports of L wrist stiffness. Patient experienced L palm discomfort with rolling for biscuit exercise today thus more interossi strengthening exercises completed. Patient demonstrated L wrist limitation and weakness with 1# strengthening in antigravity position. Edema still present throughout  patient's L wrist and lower arm region although some definition visible along L distal metacarpels. Normal modalities response noted following removal of the modalities with L arm placed in relaxed arm wrestling against bolster to promote edema reduction.   Rehab Potential Good   PT Frequency 2x / week   PT Duration 8 weeks   PT Treatment/Interventions ADLs/Self Care Home Management;Cryotherapy;Electrical Stimulation;Moist Heat;Ultrasound;Patient/family education;Therapeutic exercise;Therapeutic activities;Manual techniques;Passive range of motion   PT Next Visit Plan Continue with L wrist/hand ROM and strengthening with edema reduction techniques as well per MPT POC.   PT Home Exercise Plan Instructd pt to use her yellow thera-putty to minic making biscuits for wrist and finger AROM   Consulted and Agree with Plan of Care Patient      Patient will benefit from skilled therapeutic intervention in order to improve the following deficits and impairments:  Pain, Decreased activity tolerance, Decreased range of motion, Decreased strength  Visit Diagnosis: Localized edema  Stiffness of left wrist, not elsewhere classified  Pain in left wrist     Problem List There are no active problems to display for this patient.   Ahmed Prima, PTA 05/09/16 11:57 AM  [facility] [place], [place], [address] Phone: [phone]   Fax:  [phone]  Name: Jennifer Cowan MRN: [id number] Date of Birth: 02-Nov-1945

## 2016-05-11 ENCOUNTER — Ambulatory Visit: Payer: Medicare HMO | Admitting: Physical Therapy

## 2016-05-11 DIAGNOSIS — M25532 Pain in left wrist: Secondary | ICD-10-CM

## 2016-05-11 DIAGNOSIS — M25632 Stiffness of left wrist, not elsewhere classified: Secondary | ICD-10-CM

## 2016-05-11 DIAGNOSIS — R600 Localized edema: Secondary | ICD-10-CM

## 2016-05-11 NOTE — Therapy (Signed)
[facility] [place], [place], [address] Phone: [phone]   Fax:  [phone]  Physical Therapy Treatment  Patient Details  Name: STARLETTA HOUCHIN MRN: [id number] Date of Birth: 1945/11/17 Referring Provider: Gildardo Cranker MD.  Encounter Date: 05/11/2016      PT End of Session - 05/11/16 1107    Visit Number 11   Number of Visits 16   Date for PT Re-Evaluation 05/28/16   PT Start Time 0945   PT Stop Time 1036   PT Time Calculation (min) 51 min   Activity Tolerance Patient tolerated treatment well   Behavior During Therapy [facility] for tasks assessed/performed      Past Medical History:  Diagnosis Date  . Arthritis   . Cancer ([place])   . Hypertension     Past Surgical History:  Procedure Laterality Date  . BREAST SURGERY  1999   Masectomy- left    There were no vitals filed for this visit.      Subjective Assessment - 05/11/16 1107    Subjective I want to get rid of this pain.   Pain Score 6    Pain Location Hand   Pain Orientation Left   Pain Descriptors / Indicators Discomfort   Pain Onset More than a month ago   Pain Frequency Constant      Treatment:  UBE x 6 minutes, pulleys x 6 mins f/b PROM to left wrist and forearm x 12 minutes f/b pre-mod e'stim at 80-150 HZ (constant) x 20 minutes.  KT applied by PTA to conclude treatment.                             PT Short Term Goals - 03/29/16 1247      PT SHORT TERM GOAL #1   Title STG's=LTG's.           PT Long Term Goals - 05/05/16 1028      PT LONG TERM GOAL #1   Title Independent with a HEP.   Time 8   Period Weeks   Status Achieved     PT LONG TERM GOAL #2   Title Active left wrist flexion and extension= 45 degrees.   Time 8   Period Weeks   Status On-going     PT LONG TERM GOAL #3   Title Left forearm supination actively to 70 degrees.   Time 8   Period Weeks   Status On-going     PT LONG TERM GOAL #4    Title Patient carry 5# x 500 feet with her left hand with pain not > 3/10.   Status On-going     PT LONG TERM GOAL #5   Title Patient perform ADL's with pain not > 3/10.   Baseline Patient currently reporting 5-6/10 pain (04/21/16)   Time 8   Period Weeks   Status On-going               Plan - 05/11/16 1108    Clinical Impression Statement Great job today.  KT applied at end of treatment by PTA to see if this may decrease swelling.      Patient will benefit from skilled therapeutic intervention in order to improve the following deficits and impairments:  Pain, Decreased activity tolerance, Decreased range of motion, Decreased strength  Visit Diagnosis: Localized edema  Stiffness of left wrist, not elsewhere classified  Pain in left wrist     Problem  List There are no active problems to display for this patient.   Lidie Glade, [place] MPT 05/11/2016, 11:11 AM  [facility] [address] [place], [place], [address] Phone: [phone]   Fax:  [phone]  Name: ALONA DANFORD MRN: [id number] Date of Birth: 06/08/1945

## 2016-05-16 ENCOUNTER — Ambulatory Visit: Payer: Medicare HMO | Admitting: Physical Therapy

## 2016-05-16 DIAGNOSIS — M25532 Pain in left wrist: Secondary | ICD-10-CM | POA: Diagnosis not present

## 2016-05-16 DIAGNOSIS — R600 Localized edema: Secondary | ICD-10-CM

## 2016-05-16 DIAGNOSIS — M25632 Stiffness of left wrist, not elsewhere classified: Secondary | ICD-10-CM

## 2016-05-16 NOTE — Therapy (Signed)
[facility] [place], [place], [address] Phone: [phone]   Fax:  [phone]  Physical Therapy Treatment  Patient Details  Name: Jennifer Cowan MRN: [id number] Date of Birth: Dec 23, 1945 Referring Provider: Gildardo Cranker MD.  Encounter Date: 05/16/2016      PT End of Session - 05/16/16 1103    Visit Number 12   Number of Visits 16   Date for PT Re-Evaluation 05/28/16   PT Start Time 1030      Past Medical History:  Diagnosis Date  . Arthritis   . Cancer ([place])   . Hypertension     Past Surgical History:  Procedure Laterality Date  . BREAST SURGERY  1999   Masectomy- left    There were no vitals filed for this visit.      Subjective Assessment - 05/16/16 1108    Subjective I want to get rid of this swelling.  I need to get off the sodas and I only drink one bottle of water a day.                         [facility] Adult PT Treatment/Exercise - 05/16/16 0001      Shoulder Exercises: ROM/Strengthening   UBE (Upper Arm Bike) 60 RPM's x 8 minutes (4 minutes forward and 4 minutes backward).     Hand Exercises   Other Hand Exercises Red Power web x 4 minutes.   Other Hand Exercises 2# wrist extension slow reps 2 sets x 20 reps.     Modalities   Modalities Cryotherapy;Electrical Stimulation     Cryotherapy   Number Minutes Cryotherapy 15 Minutes   Cryotherapy Location --  Left wrist hand.   Type of Cryotherapy Ice massage     Electrical Stimulation   Electrical Stimulation Location Left wrist/hand with left UE elevated on angular wedge.   Electrical Stimulation Action IFC at 1-10 Hz x 15 minutes.   Electrical Stimulation Goals Edema;Pain     Manual Therapy   Manual Therapy Soft tissue mobilization   Passive ROM STW/M and retrograde technique to decrease swelling with left UE elevated overhead x 9 minutes.                  PT Short Term Goals - 03/29/16 1247      PT SHORT  TERM GOAL #1   Title STG's=LTG's.           PT Long Term Goals - 05/05/16 1028      PT LONG TERM GOAL #1   Title Independent with a HEP.   Time 8   Period Weeks   Status Achieved     PT LONG TERM GOAL #2   Title Active left wrist flexion and extension= 45 degrees.   Time 8   Period Weeks   Status On-going     PT LONG TERM GOAL #3   Title Left forearm supination actively to 70 degrees.   Time 8   Period Weeks   Status On-going     PT LONG TERM GOAL #4   Title Patient carry 5# x 500 feet with her left hand with pain not > 3/10.   Status On-going     PT LONG TERM GOAL #5   Title Patient perform ADL's with pain not > 3/10.   Baseline Patient currently reporting 5-6/10 pain (04/21/16)   Time 8   Period Weeks   Status On-going  Plan - 05/16/16 1108    Clinical Impression Statement Circumferential palm measure initially 2 cms > right and left and post treatment .5 cm difference (left > right).      Patient will benefit from skilled therapeutic intervention in order to improve the following deficits and impairments:  Pain, Decreased activity tolerance, Decreased range of motion, Decreased strength  Visit Diagnosis: Localized edema  Stiffness of left wrist, not elsewhere classified  Pain in left wrist     Problem List There are no active problems to display for this patient.   Dionysios Massman, [place] MPT 05/16/2016, 11:28 AM  [facility] [address] [place], [place], [address] Phone: [phone]   Fax:  [phone]  Name: Jennifer Cowan MRN: [id number] Date of Birth: Dec 07, 1945

## 2016-05-18 ENCOUNTER — Encounter: Payer: Self-pay | Admitting: Physical Therapy

## 2016-05-18 ENCOUNTER — Ambulatory Visit: Payer: Medicare HMO | Attending: Orthopedic Surgery | Admitting: Physical Therapy

## 2016-05-18 DIAGNOSIS — M25532 Pain in left wrist: Secondary | ICD-10-CM | POA: Diagnosis present

## 2016-05-18 DIAGNOSIS — M25632 Stiffness of left wrist, not elsewhere classified: Secondary | ICD-10-CM | POA: Diagnosis present

## 2016-05-18 DIAGNOSIS — R600 Localized edema: Secondary | ICD-10-CM | POA: Insufficient documentation

## 2016-05-18 NOTE — Therapy (Signed)
[facility] [place], [place], [address] Phone: [phone]   Fax:  [phone]  Physical Therapy Treatment  Patient Details  Name: Jennifer Cowan MRN: [id number] Date of Birth: [birth year] Referring Provider: Gildardo Cranker MD.  Encounter Date: 05/18/2016      PT End of Session - 05/18/16 1056    Visit Number 13   Number of Visits 16   Date for PT Re-Evaluation 05/28/16   PT Start Time 1024   PT Stop Time 1110   PT Time Calculation (min) 46 min   Activity Tolerance Patient tolerated treatment well   Behavior During Therapy [facility] for tasks assessed/performed      Past Medical History:  Diagnosis Date  . Arthritis   . Cancer ([place])   . Hypertension     Past Surgical History:  Procedure Laterality Date  . BREAST SURGERY  1999   Masectomy- left    There were no vitals filed for this visit.      Subjective Assessment - 05/18/16 1026    Subjective It still hurts a little sometimes, I'm just ready to get rid of it   Limitations House hold activities   Patient Stated Goals Use my left wrist without pain.   Currently in Pain? Yes   Pain Score 2    Pain Location Hand   Pain Orientation Left   Pain Descriptors / Indicators Discomfort   Pain Onset More than a month ago                         [facility] Adult PT Treatment/Exercise - 05/18/16 0001      Shoulder Exercises: ROM/Strengthening   UBE (Upper Arm Bike) 90 RPMs x 8 mins alternating fwd/bckwd     Hand Exercises   Other Hand Exercises red power web x 2 mins     Wrist Exercises   Other wrist exercises wrist flex/ext, radial/ulnar deviation and forearm pronate/supinate all 2# x 30     Cryotherapy   Number Minutes Cryotherapy 15 Minutes   Cryotherapy Location Hand;Wrist   Type of Cryotherapy Ice pack     Electrical Stimulation   Electrical Stimulation Location Left wrist/hand   Electrical Stimulation Action IFC 1-'10Hz'$    Electrical  Stimulation Parameters to tolerance   Electrical Stimulation Goals Edema     Manual Therapy   Manual Therapy Soft tissue mobilization   Passive ROM STW and retrograde massage to improve mobility and reduce edema                  PT Short Term Goals - 03/29/16 1247      PT SHORT TERM GOAL #1   Title STG's=LTG's.           PT Long Term Goals - 05/18/16 1057      PT LONG TERM GOAL #1   Title Independent with a HEP.   Status Achieved     PT LONG TERM GOAL #2   Title Active left wrist flexion and extension= 45 degrees.   Status On-going     PT LONG TERM GOAL #3   Title Left forearm supination actively to 70 degrees.   Status On-going     PT LONG TERM GOAL #4   Title Patient carry 5# x 500 feet with her left hand with pain not > 3/10.   Status On-going     PT LONG TERM GOAL #5   Title Patient perform ADL's with pain not >  3/10.   Status Achieved  pain 2/10 5/2               Plan - 05/18/16 1057    Clinical Impression Statement Pt with decreased pain today, still with significant swelling which limits functional mobility   Rehab Potential Good   PT Frequency 2x / week   PT Duration 8 weeks   PT Treatment/Interventions ADLs/Self Care Home Management;Cryotherapy;Electrical Stimulation;Moist Heat;Ultrasound;Patient/family education;Therapeutic exercise;Therapeutic activities;Manual techniques;Passive range of motion   PT Next Visit Plan continue manual and modAlities to reduce swelling, progress strengtheing of Lt wrist and elbow      Patient will benefit from skilled therapeutic intervention in order to improve the following deficits and impairments:  Pain, Decreased activity tolerance, Decreased range of motion, Decreased strength  Visit Diagnosis: Localized edema  Stiffness of left wrist, not elsewhere classified  Pain in left wrist     Problem List There are no active problems to display for this patient.   [name], PT,  DPT 05/18/2016, 10:59 AM  [facility] [address] [place], [place], [address] Phone: [phone]   Fax:  [phone]  Name: JOELEE SNOKE MRN: [id number] Date of Birth: 1945-11-03

## 2016-05-23 ENCOUNTER — Encounter: Payer: Self-pay | Admitting: Physical Therapy

## 2016-05-23 ENCOUNTER — Ambulatory Visit: Payer: Medicare HMO | Admitting: Physical Therapy

## 2016-05-23 DIAGNOSIS — R600 Localized edema: Secondary | ICD-10-CM

## 2016-05-23 DIAGNOSIS — M25532 Pain in left wrist: Secondary | ICD-10-CM

## 2016-05-23 DIAGNOSIS — M25632 Stiffness of left wrist, not elsewhere classified: Secondary | ICD-10-CM

## 2016-05-23 NOTE — Therapy (Signed)
[facility] [place], [place], [address] Phone: [phone]   Fax:  [phone]  Physical Therapy Treatment  Patient Details  Name: Jennifer Cowan MRN: [id number] Date of Birth: [birth year] Referring Provider: Gildardo Cranker MD.  Encounter Date: 05/23/2016      PT End of Session - 05/23/16 1216    Visit Number 14   Number of Visits 16   Date for PT Re-Evaluation 05/28/16   PT Start Time 1115   PT Stop Time 1205   PT Time Calculation (min) 50 min   Activity Tolerance Patient tolerated treatment well   Behavior During Therapy [facility] for tasks assessed/performed      Past Medical History:  Diagnosis Date  . Arthritis   . Cancer ([place])   . Hypertension     Past Surgical History:  Procedure Laterality Date  . BREAST SURGERY  1999   Masectomy- left    There were no vitals filed for this visit.      Subjective Assessment - 05/23/16 1214    Subjective Pt reporting 6/10 pain today. Pt still reporting pain in her palm and wrist.   Limitations House hold activities   Patient Stated Goals Use my left wrist without pain.   Currently in Pain? Yes   Pain Score 6    Pain Location Hand   Pain Orientation Left   Pain Descriptors / Indicators Sore;Discomfort   Pain Type Acute pain   Pain Onset More than a month ago   Pain Frequency Constant   Aggravating Factors  moving hand/wrist   Pain Relieving Factors resting, ice                         [facility] Adult PT Treatment/Exercise - 05/23/16 0001      Hand Exercises   Other Hand Exercises red power web x 2 mins     Wrist Exercises   Other wrist exercises wrist flex/ext, radial/ulnar deviation and forearm pronate/supinate all 2# x 30   Other wrist exercises ball rolls up and down the wall working on wrist extension and flexion.      Cryotherapy   Number Minutes Cryotherapy 15 Minutes   Cryotherapy Location Hand;Wrist;Forearm   Type of Cryotherapy Ice  pack     Electrical Stimulation   Electrical Stimulation Location Left wrist/hand   Electrical Stimulation Action IFC 1-'10Hz'$ , x 15 minutes, intensity to pt's tolerance   Electrical Stimulation Goals Edema     Manual Therapy   Manual Therapy Soft tissue mobilization   Passive ROM STW and retrograde massage to improve mobility and reduce edema                PT Education - 05/23/16 1215    Education provided Yes   Education Details Discussed possible taping again   Person(s) Educated Patient   Methods Explanation   Comprehension Verbalized understanding          PT Short Term Goals - 03/29/16 1247      PT SHORT TERM GOAL #1   Title STG's=LTG's.           PT Long Term Goals - 05/23/16 1219      PT LONG TERM GOAL #1   Title Independent with a HEP.   Time 8   Period Weeks   Status Achieved     PT LONG TERM GOAL #2   Title Active left wrist flexion and extension= 45 degrees.   Time 8  Period Weeks   Status On-going     PT LONG TERM GOAL #3   Title Left forearm supination actively to 70 degrees.   Time 8   Period Weeks   Status On-going     PT LONG TERM GOAL #4   Title Patient carry 5# x 500 feet with her left hand with pain not > 3/10.   Time 8   Period Weeks   Status On-going     PT LONG TERM GOAL #5   Title Patient perform ADL's with pain not > 3/10.   Baseline Patient currently reporting 5-6/10 pain (04/21/16)   Time 8   Period Weeks   Status Achieved               Plan - 05/23/16 1216    Clinical Impression Statement Patient with pain today of 6/10. Pt reporting pain in her palm. Pt still with limited extension.    Rehab Potential Good   PT Frequency 2x / week   PT Duration 8 weeks   PT Treatment/Interventions ADLs/Self Care Home Management;Cryotherapy;Electrical Stimulation;Moist Heat;Ultrasound;Patient/family education;Therapeutic exercise;Therapeutic activities;Manual techniques;Passive range of motion   PT Next Visit Plan  continue manual and modAlities to reduce swelling, progress strengtheing of Lt wrist and elbow   PT Home Exercise Plan extension stretching passively   Consulted and Agree with Plan of Care Patient      Patient will benefit from skilled therapeutic intervention in order to improve the following deficits and impairments:  Pain, Decreased activity tolerance, Decreased range of motion, Decreased strength  Visit Diagnosis: Localized edema  Pain in left wrist  Stiffness of left wrist, not elsewhere classified     Problem List There are no active problems to display for this patient.   Oretha Caprice, MPT 05/23/2016, 12:21 PM  [facility] [address] [place], [place], [address] Phone: [phone]   Fax:  [phone]  Name: SURIAH PERAGINE MRN: [id number] Date of Birth: Jan 18, 1945

## 2016-05-24 ENCOUNTER — Other Ambulatory Visit: Payer: Self-pay | Admitting: Physician Assistant

## 2016-05-25 ENCOUNTER — Ambulatory Visit: Payer: Medicare HMO | Admitting: Physical Therapy

## 2016-05-25 ENCOUNTER — Encounter: Payer: Self-pay | Admitting: Physical Therapy

## 2016-05-25 DIAGNOSIS — R600 Localized edema: Secondary | ICD-10-CM

## 2016-05-25 DIAGNOSIS — M25532 Pain in left wrist: Secondary | ICD-10-CM

## 2016-05-25 DIAGNOSIS — M25632 Stiffness of left wrist, not elsewhere classified: Secondary | ICD-10-CM

## 2016-05-25 NOTE — Therapy (Signed)
[facility] [place], [place], [address] Phone: [phone]   Fax:  [phone]  Physical Therapy Treatment  Patient Details  Name: Jennifer Cowan MRN: [id number] Date of Birth: Mar 26, 1945 Referring Provider: Gildardo Cranker MD.  Encounter Date: 05/25/2016      PT End of Session - 05/25/16 1047    Visit Number 15   Number of Visits 16   Date for PT Re-Evaluation 05/28/16   PT Start Time 6734   PT Stop Time 1121   PT Time Calculation (min) 46 min   Activity Tolerance Patient tolerated treatment well   Behavior During Therapy [facility] for tasks assessed/performed      Past Medical History:  Diagnosis Date  . Arthritis   . Cancer ([place])   . Hypertension     Past Surgical History:  Procedure Laterality Date  . BREAST SURGERY  1999   Masectomy- left    There were no vitals filed for this visit.      Subjective Assessment - 05/25/16 1046    Subjective Reports that the tape helped some.   Limitations House hold activities   Patient Stated Goals Use my left wrist without pain.   Currently in Pain? --  No complete pain rating provided            [facility] PT Assessment - 05/25/16 0001      Assessment   Medical Diagnosis Left wrist fracture.   Next MD Visit 05/2016     Precautions   Precautions None     Restrictions   Weight Bearing Restrictions No                     [facility] Adult PT Treatment/Exercise - 05/25/16 0001      Shoulder Exercises: Pulleys   Flexion Other (comment)  x5 min     Shoulder Exercises: ROM/Strengthening   UBE (Upper Arm Bike) 90 RPMs x 5 min     Wrist Exercises   Bar Weights/Barbell (Forearm Supination) 2 lbs  x30 reps   Bar Weights/Barbell (Forearm Pronation) 2 lbs  x30 reps   Wrist Radial Deviation Strengthening;Left;Other reps (comment);Seated;Bar weights/barbell   Bar Weights/Barbell (Radial Deviation) 2 lbs   Wrist Radial Deviation Limitations x30 reps   Wrist  Ulnar Deviation Strengthening;Left;Other reps (comment);Seated;Bar weights/barbell   Bar Weights/Barbell (Ulnar Deviation) 2 lbs   Wrist Ulnar Deviation Limitations x30 rps   Other wrist exercises L wrist flex/ext 2# x30 reps each   Other wrist exercises L wrist extension stretch with 2# 1x60 sec hold     Manual Therapy   Manual Therapy Taping;Edema management   Passive ROM STW and retrograde massage to improve mobility and reduce edema   Kinesiotex Edema     Kinesiotix   Edema Applied to L forearm with distal attachment over dorsal hand to reduce edema present following L wrist fracture                  PT Short Term Goals - 03/29/16 1247      PT SHORT TERM GOAL #1   Title STG's=LTG's.           PT Long Term Goals - 05/23/16 1219      PT LONG TERM GOAL #1   Title Independent with a HEP.   Time 8   Period Weeks   Status Achieved     PT LONG TERM GOAL #2   Title Active left wrist flexion and extension= 45 degrees.  Time 8   Period Weeks   Status On-going     PT LONG TERM GOAL #3   Title Left forearm supination actively to 70 degrees.   Time 8   Period Weeks   Status On-going     PT LONG TERM GOAL #4   Title Patient carry 5# x 500 feet with her left hand with pain not > 3/10.   Time 8   Period Weeks   Status On-going     PT LONG TERM GOAL #5   Title Patient perform ADL's with pain not > 3/10.   Baseline Patient currently reporting 5-6/10 pain (04/21/16)   Time 8   Period Weeks   Status Achieved               Plan - 05/25/16 1123    Clinical Impression Statement Patient remains greatly limited with L wrist ROM and continues to present with increased edema from L hand to elbow region. A band of tone noted horizontally along posterior forearm during manual therapy in mid to proximal R forearm. Patient demonstrated continued lack of L wrist ROM even following weighted wrist extension stretch off arm of chair. Patient still tender to palpation  over L distal ulna where she reports where the fracture occured. Educated to keep tape on for 3-4 days but if it needs to be removed due to increased irritation or due to allergic reaction that did not present in previous taping session than to remove with vegetable oil or after shower.   Rehab Potential Good   PT Frequency 2x / week   PT Duration 8 weeks   PT Next Visit Plan continue manual and modAlities to reduce swelling, progress strengtheing of Lt wrist and elbow   PT Home Exercise Plan extension stretching passively   Consulted and Agree with Plan of Care Patient      Patient will benefit from skilled therapeutic intervention in order to improve the following deficits and impairments:  Pain, Decreased activity tolerance, Decreased range of motion, Decreased strength  Visit Diagnosis: Pain in left wrist  Stiffness of left wrist, not elsewhere classified  Localized edema     Problem List There are no active problems to display for this patient.   Wynelle Fanny, PTA 05/25/2016, 12:08 PM  [facility] [address] [place], [place], [address] Phone: [phone]   Fax:  [phone]  Name: Jennifer Cowan MRN: [id number] Date of Birth: June 21, 1945

## 2016-05-26 NOTE — Telephone Encounter (Signed)
I do not think she is a patient here.

## 2016-05-27 NOTE — Telephone Encounter (Signed)
Attempted to contact patient no answer °

## 2016-05-31 ENCOUNTER — Encounter: Payer: Self-pay | Admitting: Physical Therapy

## 2016-05-31 ENCOUNTER — Ambulatory Visit: Payer: Medicare HMO | Admitting: Physical Therapy

## 2016-05-31 DIAGNOSIS — M25532 Pain in left wrist: Secondary | ICD-10-CM

## 2016-05-31 DIAGNOSIS — R600 Localized edema: Secondary | ICD-10-CM | POA: Diagnosis not present

## 2016-05-31 DIAGNOSIS — M25632 Stiffness of left wrist, not elsewhere classified: Secondary | ICD-10-CM

## 2016-05-31 NOTE — Therapy (Signed)
[facility] [place], [place], [address] Phone: [phone]   Fax:  [phone]  Physical Therapy Treatment  Patient Details  Name: Jennifer Cowan MRN: [id number] Date of Birth: [birth year] Referring Provider: Gildardo Cranker MD.  Encounter Date: 05/31/2016      PT End of Session - 05/31/16 1033    Visit Number 16   Number of Visits 16   Date for PT Re-Evaluation 05/28/16   PT Start Time 1031   PT Stop Time 1113   PT Time Calculation (min) 42 min   Activity Tolerance Patient tolerated treatment well   Behavior During Therapy [facility] for tasks assessed/performed      Past Medical History:  Diagnosis Date  . Arthritis   . Cancer ([place])   . Hypertension     Past Surgical History:  Procedure Laterality Date  . BREAST SURGERY  1999   Masectomy- left    There were no vitals filed for this visit.      Subjective Assessment - 05/31/16 1033    Subjective Reports that she still is having swelling and stiffness in L wrist. Walking with her hand down is definately causing swelling. Reports that taping does help some.   Limitations House hold activities   Patient Stated Goals Use my left wrist without pain.   Currently in Pain? Yes   Pain Score 6    Pain Location Hand   Pain Orientation Left   Pain Descriptors / Indicators Discomfort;Other (Comment)  Stiffness   Pain Type Acute pain   Pain Onset More than a month ago   Pain Frequency Intermittent            [facility] PT Assessment - 05/31/16 0001      Assessment   Medical Diagnosis Left wrist fracture.   Next MD Visit 06/02/2016     Precautions   Precautions None     Restrictions   Weight Bearing Restrictions No     Circumferential Edema   Circumferential - Right 17 cm  6 in from ulnar styloid process 24.4 cm   Circumferential - Left  20.4 cm   6 in from ulnar styloid process 27.9 cm     ROM / Strength   AROM / PROM / Strength Strength     Strength   Overall Strength Deficits   Strength Assessment Site Hand   Right/Left hand Right;Left   Right Hand Gross Grasp Functional   Right Hand Grip (lbs) 40   Left Hand Gross Grasp Impaired   Left Hand Grip (lbs) 20                     [facility] Adult PT Treatment/Exercise - 05/31/16 0001      Shoulder Exercises: Pulleys   Flexion Other (comment)  x5 min     Shoulder Exercises: ROM/Strengthening   UBE (Upper Arm Bike) 90 RPMs x 5 min     Hand Exercises   Other Hand Exercises red power web x 2 mins   Other Hand Exercises B wrist towel wringing for ROM and grip      Manual Therapy   Manual Therapy Taping;Edema management   Edema Management Retrograde massage against gravity in sitting from L wrist to L elbow to reduce excessive edema   Kinesiotex Edema     Kinesiotix   Edema Applied to L forearm with distal attachment over dorsal hand to reduce edema present following L wrist fracture  PT Short Term Goals - 03/29/16 1247      PT SHORT TERM GOAL #1   Title STG's=LTG's.           PT Long Term Goals - 05/23/16 1219      PT LONG TERM GOAL #1   Title Independent with a HEP.   Time 8   Period Weeks   Status Achieved     PT LONG TERM GOAL #2   Title Active left wrist flexion and extension= 45 degrees.   Time 8   Period Weeks   Status On-going     PT LONG TERM GOAL #3   Title Left forearm supination actively to 70 degrees.   Time 8   Period Weeks   Status On-going     PT LONG TERM GOAL #4   Title Patient carry 5# x 500 feet with her left hand with pain not > 3/10.   Time 8   Period Weeks   Status On-going     PT LONG TERM GOAL #5   Title Patient perform ADL's with pain not > 3/10.   Baseline Patient currently reporting 5-6/10 pain (04/21/16)   Time 8   Period Weeks   Status Achieved               Plan - 05/31/16 1217    Clinical Impression Statement Patient continues to be greatly limited by the excessive edema noted  throughout patient's L lower arm and into her hand. Patient's L wrist grip is greatly weakened due to edema upon comparison of B hands ( L hand 20#, R hand 40#). Swelling noted from dorsal L wrist to elbow region with centimeters of difference as noted in measurements provided in today's note. Retrograde massage completed today in antigravity position and taping completed again today as patient reports some relief with taping.   Rehab Potential Good   PT Frequency 2x / week   PT Duration 8 weeks   PT Treatment/Interventions ADLs/Self Care Home Management;Cryotherapy;Electrical Stimulation;Moist Heat;Ultrasound;Patient/family education;Therapeutic exercise;Therapeutic activities;Manual techniques;Passive range of motion   PT Next Visit Plan Continue per MD discretion.   PT Home Exercise Plan extension stretching passively   Consulted and Agree with Plan of Care Patient      Patient will benefit from skilled therapeutic intervention in order to improve the following deficits and impairments:  Pain, Decreased activity tolerance, Decreased range of motion, Decreased strength  Visit Diagnosis: Pain in left wrist  Stiffness of left wrist, not elsewhere classified  Localized edema     Problem List There are no active problems to display for this patient.   Ahmed Prima, PTA 05/31/16 12:26 PM  [facility] [place], [place], [address] Phone: [phone]   Fax:  [phone]  Name: Jennifer Cowan MRN: [id number] Date of Birth: 11/05/1945

## 2016-06-06 ENCOUNTER — Encounter: Payer: Self-pay | Admitting: Physical Therapy

## 2016-06-06 ENCOUNTER — Ambulatory Visit: Payer: Medicare HMO | Admitting: Physical Therapy

## 2016-06-06 DIAGNOSIS — R600 Localized edema: Secondary | ICD-10-CM | POA: Diagnosis not present

## 2016-06-06 DIAGNOSIS — M25532 Pain in left wrist: Secondary | ICD-10-CM

## 2016-06-06 DIAGNOSIS — M25632 Stiffness of left wrist, not elsewhere classified: Secondary | ICD-10-CM

## 2016-06-06 NOTE — Therapy (Signed)
[facility] [place], [place], [address] Phone: [phone]   Fax:  [phone]  Physical Therapy Treatment  Patient Details  Name: Jennifer Cowan MRN: [id number] Date of Birth: 06-10-1945 Referring Provider: Gildardo Cranker MD.  Encounter Date: 06/06/2016      PT End of Session - 06/06/16 1036    Visit Number 17   Number of Visits 16   Date for PT Re-Evaluation 05/28/16   PT Start Time 1031   PT Stop Time 1110   PT Time Calculation (min) 39 min   Activity Tolerance Patient tolerated treatment well   Behavior During Therapy [facility] for tasks assessed/performed      Past Medical History:  Diagnosis Date  . Arthritis   . Cancer ([place])   . Hypertension     Past Surgical History:  Procedure Laterality Date  . BREAST SURGERY  1999   Masectomy- left    There were no vitals filed for this visit.      Subjective Assessment - 06/06/16 1021    Subjective Reports she doesn't go to MD until 06/14/2016.   Limitations House hold activities   Patient Stated Goals Use my left wrist without pain.   Currently in Pain? Yes   Pain Score 6    Pain Location Hand   Pain Orientation Left   Pain Descriptors / Indicators Discomfort   Pain Type Acute pain   Pain Onset More than a month ago            [facility] PT Assessment - 06/06/16 0001      Assessment   Medical Diagnosis Left wrist fracture.   Next MD Visit 06/14/2016     Precautions   Precautions None     Restrictions   Weight Bearing Restrictions No     Circumferential Edema   Circumferential - Right 17 cm   6 in from R ulnar styloid process 24 cm   Circumferential - Left  21 cm  6 in from L ulnar styloid process 28 cm                     [facility] Adult PT Treatment/Exercise - 06/06/16 0001      Shoulder Exercises: Standing   Other Standing Exercises L wall ladder for finger mobility x8 reps to level 33     Shoulder Exercises: ROM/Strengthening   UBE (Upper Arm Bike) 90 RPM x6 min     Hand Exercises   Other Hand Exercises L tennis grip x3 min   Other Hand Exercises B wrist towel wringing for ROM and grip x3 min     Wrist Exercises   Bar Weights/Barbell (Forearm Supination) 2 lbs  x30 reps   Bar Weights/Barbell (Forearm Pronation) 2 lbs  x30 rpes   Wrist Radial Deviation Strengthening;Left;Other reps (comment);Seated;Bar weights/barbell   Bar Weights/Barbell (Radial Deviation) 2 lbs   Wrist Radial Deviation Limitations x30 reps   Wrist Ulnar Deviation Strengthening;Left;Other reps (comment);Seated;Bar weights/barbell   Bar Weights/Barbell (Ulnar Deviation) 2 lbs   Wrist Ulnar Deviation Limitations x30 reps   Other wrist exercises L wrist flex/ext 2# x30 reps each   Other wrist exercises L wrist red web grip x3 min     Manual Therapy   Manual Therapy Taping   Kinesiotex Edema     Kinesiotix   Edema Applied to L wrist extensors and flexors to reduce edema present throughout L forearm  PT Short Term Goals - 03/29/16 1247      PT SHORT TERM GOAL #1   Title STG's=LTG's.           PT Long Term Goals - 05/23/16 1219      PT LONG TERM GOAL #1   Title Independent with a HEP.   Time 8   Period Weeks   Status Achieved     PT LONG TERM GOAL #2   Title Active left wrist flexion and extension= 45 degrees.   Time 8   Period Weeks   Status On-going     PT LONG TERM GOAL #3   Title Left forearm supination actively to 70 degrees.   Time 8   Period Weeks   Status On-going     PT LONG TERM GOAL #4   Title Patient carry 5# x 500 feet with her left hand with pain not > 3/10.   Time 8   Period Weeks   Status On-going     PT LONG TERM GOAL #5   Title Patient perform ADL's with pain not > 3/10.   Baseline Patient currently reporting 5-6/10 pain (04/21/16)   Time 8   Period Weeks   Status Achieved               Plan - 06/06/16 1111    Clinical Impression Statement Patient  continues to present with limitations in ROM and finger mobility secondary to edema present throughout L wrist and forearm. Patient's L wrist and forearm measured as 4 cm greater than L wrist and forearm as seen in edema measurements. Taping completed to L wrist flexors and extensors to assist in edema reduction throughout L forearm.   Rehab Potential Good   PT Frequency 2x / week   PT Duration 8 weeks   PT Treatment/Interventions ADLs/Self Care Home Management;Cryotherapy;Electrical Stimulation;Moist Heat;Ultrasound;Patient/family education;Therapeutic exercise;Therapeutic activities;Manual techniques;Passive range of motion   PT Next Visit Plan Continue per MD discretion.   PT Home Exercise Plan extension stretching passively   Consulted and Agree with Plan of Care Patient      Patient will benefit from skilled therapeutic intervention in order to improve the following deficits and impairments:  Pain, Decreased activity tolerance, Decreased range of motion, Decreased strength  Visit Diagnosis: Pain in left wrist  Stiffness of left wrist, not elsewhere classified  Localized edema     Problem List There are no active problems to display for this patient.   Ahmed Prima, PTA 06/06/16 11:14 AM  [facility] [place], [place], [address] Phone: [phone]   Fax:  [phone]  Name: Jennifer Cowan MRN: [id number] Date of Birth: 1945-05-22

## 2016-06-08 ENCOUNTER — Ambulatory Visit: Payer: Medicare HMO | Admitting: Physical Therapy

## 2016-06-08 ENCOUNTER — Encounter: Payer: Self-pay | Admitting: Physical Therapy

## 2016-06-08 DIAGNOSIS — M25632 Stiffness of left wrist, not elsewhere classified: Secondary | ICD-10-CM

## 2016-06-08 DIAGNOSIS — R600 Localized edema: Secondary | ICD-10-CM

## 2016-06-08 DIAGNOSIS — M25532 Pain in left wrist: Secondary | ICD-10-CM

## 2016-06-08 NOTE — Therapy (Addendum)
[facility] [place], [place], [address] Phone: [phone]   Fax:  [phone]  Physical Therapy Treatment  Patient Details  Name: Jennifer Cowan MRN: [id number] Date of Birth: December 31, 1945 Referring Provider: Gildardo Cranker MD.  Encounter Date: 06/08/2016      PT End of Session - 06/08/16 1033    Visit Number 18   Number of Visits 24   Date for PT Re-Evaluation 07/09/16   PT Start Time 3818   PT Stop Time 1118   PT Time Calculation (min) 43 min   Activity Tolerance Patient tolerated treatment well   Behavior During Therapy [facility] for tasks assessed/performed      Past Medical History:  Diagnosis Date  . Arthritis   . Cancer ([place])   . Hypertension     Past Surgical History:  Procedure Laterality Date  . BREAST SURGERY  1999   Masectomy- left    There were no vitals filed for this visit.      Subjective Assessment - 06/08/16 1033    Subjective Reports that the tape didn't come off well on the flexors side but says the swelling went down some on the back of her hand. Reports that L middle finger is stiff.   Limitations House hold activities   Patient Stated Goals Use my left wrist without pain.   Currently in Pain? Yes   Pain Score 6    Pain Location Hand   Pain Orientation Left   Pain Descriptors / Indicators Discomfort   Pain Type Acute pain   Pain Onset More than a month ago            [facility] PT Assessment - 06/08/16 0001      Assessment   Medical Diagnosis Left wrist fracture.   Next MD Visit 06/14/2016     Precautions   Precautions None     Restrictions   Weight Bearing Restrictions No     Circumferential Edema   Circumferential - Right 16.5 cm  24.6 cm 6 in from R ulnar styloid process   Circumferential - Left  20.1 cm  6 in from ulnar styloid process 27.3 cm     ROM / Strength   AROM / PROM / Strength Strength     Strength   Overall Strength Deficits   Strength Assessment Site  Hand   Right/Left hand Right;Left   Right Hand Gross Grasp Functional   Right Hand Grip (lbs) 40   Left Hand Gross Grasp Impaired   Left Hand Grip (lbs) 15                     [facility] Adult PT Treatment/Exercise - 06/08/16 0001      Shoulder Exercises: ROM/Strengthening   UBE (Upper Arm Bike) 90 RPM x6 min     Hand Exercises   Other Hand Exercises L wrist flex and ext in neutral position x30 reps   Other Hand Exercises B red flexbar x3 min     Wrist Exercises   Bar Weights/Barbell (Forearm Supination) 3 lbs  x30 reps   Bar Weights/Barbell (Forearm Pronation) 3 lbs  x30 reps   Wrist Radial Deviation Strengthening;Left;Other reps (comment);Seated;Bar weights/barbell   Bar Weights/Barbell (Radial Deviation) 3 lbs   Wrist Radial Deviation Limitations x30 reps   Bar Weights/Barbell (Ulnar Deviation) 3 lbs   Wrist Ulnar Deviation Limitations x30 reps   Other wrist exercises L wrist flex 3# x30 reps each, ext 3# limited to x15 reps  due to ulnar styloid process pain   Other wrist exercises L wrist red web grip x30 reps     Manual Therapy   Manual Therapy Edema management;Taping   Edema Management Retrograde massage against gravity in sitting from L wrist to L elbow to reduce excessive edema   Kinesiotex Edema     Kinesiotix   Edema Applied to L wrist extensors and flexors to reduce edema present throughout L forearm                  PT Short Term Goals - 03/29/16 1247      PT SHORT TERM GOAL #1   Title STG's=LTG's.           PT Long Term Goals - 05/23/16 1219      PT LONG TERM GOAL #1   Title Independent with a HEP.   Time 8   Period Weeks   Status Achieved     PT LONG TERM GOAL #2   Title Active left wrist flexion and extension= 45 degrees.   Time 8   Period Weeks   Status On-going     PT LONG TERM GOAL #3   Title Left forearm supination actively to 70 degrees.   Time 8   Period Weeks   Status On-going     PT LONG TERM GOAL #4    Title Patient carry 5# x 500 feet with her left hand with pain not > 3/10.   Time 8   Period Weeks   Status On-going     PT LONG TERM GOAL #5   Title Patient perform ADL's with pain not > 3/10.   Baseline Patient currently reporting 5-6/10 pain (04/21/16)   Time 8   Period Weeks   Status Achieved               Plan - 06/08/16 1130    Clinical Impression Statement Patient continues to present with limitations in ROM and strength secondary to continued pain in ulnar styloid process region and also increased edema. Minimal definition of L dorsal hand observed today with finger extensor tendons noted distally. Edema measurements comparing both wrist and forearms are present in today's note and also included are grip measurements which are shown with defcits in L hand. Patient limited with resisted and antigravity wrist extension today due to ulnar styloid process but able to complete improved wrist extension actively and in neutral arm position. Patient still limited with wrist supination as well even with 3# resistance. Retrograde massage and taping have been utilized to promote edema reduction which patient experiences reduction of edema although slightly with taping.    Rehab Potential Good   PT Frequency 2x / week   PT Duration 8 weeks   PT Treatment/Interventions ADLs/Self Care Home Management;Cryotherapy;Electrical Stimulation;Moist Heat;Ultrasound;Patient/family education;Therapeutic exercise;Therapeutic activities;Manual techniques;Passive range of motion   PT Next Visit Plan Continue per MD discretion.   PT Home Exercise Plan extension stretching passively   Consulted and Agree with Plan of Care Patient      Patient will benefit from skilled therapeutic intervention in order to improve the following deficits and impairments:  Pain, Decreased activity tolerance, Decreased range of motion, Decreased strength  Visit Diagnosis: Pain in left wrist  Stiffness of left wrist, not  elsewhere classified  Localized edema     Problem List There are no active problems to display for this patient.   Ahmed Prima, PTA 06/08/2016, 12:51 PM Mali Applegate MPT [facility] [address]  [place], [place], [address] Phone: [phone]   Fax:  [phone]  Name: Jennifer Cowan MRN: [id number] Date of Birth: 12-Jun-1945  PHYSICAL THERAPY DISCHARGE SUMMARY  Visits from Start of Care: 18.  Current functional level related to goals / functional outcomes: Se above.   Remaining deficits: Goals #1 and #5 met.   Education / Equipment: HEP. Plan: Patient agrees to discharge.  Patient goals were partially met. Patient is being discharged due to not returning since the last visit.  ?????         Mali Applegate MPT

## 2016-10-12 ENCOUNTER — Other Ambulatory Visit: Payer: Self-pay | Admitting: Physician Assistant

## 2016-10-19 ENCOUNTER — Other Ambulatory Visit: Payer: Self-pay | Admitting: Physician Assistant

## 2017-01-18 DIAGNOSIS — E785 Hyperlipidemia, unspecified: Secondary | ICD-10-CM | POA: Diagnosis not present

## 2017-01-18 DIAGNOSIS — I10 Essential (primary) hypertension: Secondary | ICD-10-CM | POA: Diagnosis not present

## 2017-01-18 DIAGNOSIS — M1990 Unspecified osteoarthritis, unspecified site: Secondary | ICD-10-CM | POA: Diagnosis not present

## 2017-01-18 DIAGNOSIS — J209 Acute bronchitis, unspecified: Secondary | ICD-10-CM | POA: Diagnosis not present

## 2017-01-31 DIAGNOSIS — R05 Cough: Secondary | ICD-10-CM | POA: Diagnosis not present

## 2017-01-31 DIAGNOSIS — J40 Bronchitis, not specified as acute or chronic: Secondary | ICD-10-CM | POA: Diagnosis not present

## 2017-02-21 DIAGNOSIS — R05 Cough: Secondary | ICD-10-CM | POA: Diagnosis not present

## 2017-02-21 DIAGNOSIS — J40 Bronchitis, not specified as acute or chronic: Secondary | ICD-10-CM | POA: Diagnosis not present

## 2017-04-03 DIAGNOSIS — R000 Tachycardia, unspecified: Secondary | ICD-10-CM | POA: Diagnosis not present

## 2018-06-26 DIAGNOSIS — M159 Polyosteoarthritis, unspecified: Secondary | ICD-10-CM | POA: Insufficient documentation

## 2018-06-26 DIAGNOSIS — E1159 Type 2 diabetes mellitus with other circulatory complications: Secondary | ICD-10-CM | POA: Insufficient documentation

## 2018-06-26 DIAGNOSIS — E1169 Type 2 diabetes mellitus with other specified complication: Secondary | ICD-10-CM | POA: Insufficient documentation

## 2018-06-26 DIAGNOSIS — M8949 Other hypertrophic osteoarthropathy, multiple sites: Secondary | ICD-10-CM | POA: Insufficient documentation

## 2018-06-26 DIAGNOSIS — E7849 Other hyperlipidemia: Secondary | ICD-10-CM | POA: Insufficient documentation

## 2018-06-26 DIAGNOSIS — D509 Iron deficiency anemia, unspecified: Secondary | ICD-10-CM | POA: Insufficient documentation

## 2018-06-26 DIAGNOSIS — E782 Mixed hyperlipidemia: Secondary | ICD-10-CM | POA: Insufficient documentation

## 2018-06-26 DIAGNOSIS — I10 Essential (primary) hypertension: Secondary | ICD-10-CM | POA: Insufficient documentation

## 2020-03-05 ENCOUNTER — Encounter: Payer: Self-pay | Admitting: Family Medicine

## 2020-03-05 ENCOUNTER — Ambulatory Visit: Payer: Medicare PPO | Admitting: Family Medicine

## 2020-03-05 ENCOUNTER — Other Ambulatory Visit: Payer: Self-pay

## 2020-03-05 VITALS — BP 123/66 | HR 86 | Temp 98.3°F | Ht 68.0 in | Wt 208.2 lb

## 2020-03-05 DIAGNOSIS — Z853 Personal history of malignant neoplasm of breast: Secondary | ICD-10-CM | POA: Insufficient documentation

## 2020-03-05 DIAGNOSIS — M159 Polyosteoarthritis, unspecified: Secondary | ICD-10-CM

## 2020-03-05 DIAGNOSIS — Z7689 Persons encountering health services in other specified circumstances: Secondary | ICD-10-CM

## 2020-03-05 DIAGNOSIS — Z1211 Encounter for screening for malignant neoplasm of colon: Secondary | ICD-10-CM

## 2020-03-05 DIAGNOSIS — M8949 Other hypertrophic osteoarthropathy, multiple sites: Secondary | ICD-10-CM | POA: Diagnosis not present

## 2020-03-05 DIAGNOSIS — Z1231 Encounter for screening mammogram for malignant neoplasm of breast: Secondary | ICD-10-CM

## 2020-03-05 DIAGNOSIS — Z6831 Body mass index (BMI) 31.0-31.9, adult: Secondary | ICD-10-CM | POA: Diagnosis not present

## 2020-03-05 DIAGNOSIS — I10 Essential (primary) hypertension: Secondary | ICD-10-CM

## 2020-03-05 DIAGNOSIS — D509 Iron deficiency anemia, unspecified: Secondary | ICD-10-CM

## 2020-03-05 DIAGNOSIS — E7849 Other hyperlipidemia: Secondary | ICD-10-CM

## 2020-03-05 DIAGNOSIS — Z13820 Encounter for screening for osteoporosis: Secondary | ICD-10-CM

## 2020-03-05 NOTE — Patient Instructions (Signed)

## 2020-03-05 NOTE — Progress Notes (Signed)
New Patient Office Visit  Subjective:  Patient ID: Jennifer Cowan, female    DOB: 03/05/1945  Age: 75 y.o. MRN: [id number]  CC:  Chief Complaint  Patient presents with  . New Patient (Initial Visit)    HPI Jennifer Cowan presents to establish care. She has not been seen by a PCP in a few years. She had breast cancer in 1999 and had a left mastectomy. She has not had a mammogram in 5 years. She has never had a Dexa scan or a colonoscopy.   1. HTN Complaint with meds - Yes Current Medications - norvasc 10 mg, benazepril 10 mg Exercising Regularly - walks daily Watching Salt intake - Yes Pertinent ROS:  Headache - No Fatigue - No Visual Disturbances - No Chest pain - No Dyspnea - No Palpitations - No LE edema - No  She takes simvastatin for cholesterol. Mobic as needed for OA.  They report good compliance with medications and can restate their regimen by memory. No medication side effects.  Family, social, and smoking history reviewed.   BP Readings from Last 3 Encounters:  03/05/20 123/66   No flowsheet data found.    Past Medical History:  Diagnosis Date  . Anemia   . Arthritis   . Cancer [facility]) 1999   Breast Cancer  . Hyperlipidemia   . Hypertension     Past Surgical History:  Procedure Laterality Date  . BREAST SURGERY  1999   Masectomy- left  . CYSTECTOMY     cyst removed off top of head    Family History  Problem Relation Age of Onset  . Arthritis Mother   . Cancer Sister        Breast Cancer  . Diabetes Sister   . Hyperlipidemia Sister   . Hypertension Sister   . Stroke Sister   . Diabetes Brother   . Hypertension Brother     Social History   Socioeconomic History  . Marital status: Single    Spouse name: Not on file  . Number of children: 0  . Years of education: 52  . Highest education level: High school graduate  Occupational History  . Occupation: retired  Tobacco Use  . Smoking status: Never Smoker  . Smokeless tobacco: Never  Used  Vaping Use  . Vaping Use: Never used  Substance and Sexual Activity  . Alcohol use: No  . Drug use: Never  . Sexual activity: Yes    Birth control/protection: Post-menopausal  Other Topics Concern  . Not on file  Social History Narrative  . Not on file   Social Determinants of Health   Financial Resource Strain: Not on file  Food Insecurity: Not on file  Transportation Needs: Not on file  Physical Activity: Not on file  Stress: Not on file  Social Connections: Not on file  Intimate Partner Violence: Not on file    ROS Review of Systems Negative unless specially indicated above in HPI.  Objective:   Today's Vitals: BP 123/66   Pulse 86   Temp 98.3 F (36.8 C) (Temporal)   Ht '5\' 8"'  (1.727 m)   Wt 208 lb 4 oz (94.5 kg)   BMI 31.66 kg/m   Physical Exam Vitals and nursing note reviewed.  Constitutional:      General: She is not in acute distress.    Appearance: Normal appearance. She is not ill-appearing.  Eyes:     Extraocular Movements: Extraocular movements intact.     Pupils: Pupils  are equal, round, and reactive to light.  Neck:     Vascular: No carotid bruit.  Cardiovascular:     Rate and Rhythm: Normal rate and regular rhythm.     Pulses: Normal pulses.     Heart sounds: Normal heart sounds. No murmur heard.   Pulmonary:     Effort: Pulmonary effort is normal. No respiratory distress.     Breath sounds: Normal breath sounds.  Abdominal:     General: Bowel sounds are normal. There is no distension.     Palpations: Abdomen is soft.     Tenderness: There is no abdominal tenderness.  Musculoskeletal:     Cervical back: Neck supple. No tenderness.     Right lower leg: No edema.     Left lower leg: No edema.  Lymphadenopathy:     Cervical: No cervical adenopathy.  Skin:    General: Skin is warm and dry.  Neurological:     General: No focal deficit present.     Mental Status: She is alert and oriented to person, place, and time.     Motor: No  weakness.     Gait: Gait normal.  Psychiatric:        Mood and Affect: Mood normal.        Behavior: Behavior normal.        Thought Content: Thought content normal.        Judgment: Judgment normal.     Assessment & Plan:   Tonnette was seen today for new patient (initial visit).  Diagnoses and all orders for this visit:  Primary hypertension Well controlled on current regimen. Labs pending as below.  -     CBC with Differential/Platelet -     CMP14+EGFR -     Lipid panel  BMI 31.0-31.9,adult Diet and exercise. Labs pending as below.  -     CBC with Differential/Platelet -     CMP14+EGFR -     Lipid panel -     TSH  Primary osteoarthritis involving multiple joints Takes mobic as needed.  Iron deficiency anemia, unspecified iron deficiency anemia type On daily iron supplement. Labs pending as below.  -     CBC with Differential/Platelet  Other hyperlipidemia On statin therapy. Labs pending as below.  -     Lipid panel  History of breast cancer 1999. Left Mastectomy -     MM Digital Screening; Future  Encounter for screening mammogram for malignant neoplasm of breast -     MM Digital Screening; Future  Screening for colon cancer Declined today.   Screening for osteoporosis -     DG [facility] DEXA; Future  Encounter to establish care Reviewed available records.   Follow-up: Return in about 3 months (around 06/02/2020) for chronic follow up.   The patient indicates understanding of these issues and agrees with the plan.   [name], FNP

## 2020-03-06 LAB — CMP14+EGFR
ALT: 16 IU/L (ref 0–32)
AST: 18 IU/L (ref 0–40)
Albumin/Globulin Ratio: 1.6 (ref 1.2–2.2)
Albumin: 4.5 g/dL (ref 3.7–4.7)
Alkaline Phosphatase: 112 IU/L (ref 44–121)
BUN/Creatinine Ratio: 18 (ref 12–28)
BUN: 11 mg/dL (ref 8–27)
Bilirubin Total: 0.4 mg/dL (ref 0.0–1.2)
CO2: 20 mmol/L (ref 20–29)
Calcium: 9.8 mg/dL (ref 8.7–10.3)
Chloride: 103 mmol/L (ref 96–106)
Creatinine, Ser: 0.61 mg/dL (ref 0.57–1.00)
GFR calc Af Amer: 103 mL/min/{1.73_m2} (ref >59)
GFR calc non Af Amer: 90 mL/min/{1.73_m2} (ref >59)
Globulin, Total: 2.8 g/dL (ref 1.5–4.5)
Glucose: 104 mg/dL — ABNORMAL HIGH (ref 65–99)
Potassium: 4.6 mmol/L (ref 3.5–5.2)
Sodium: 145 mmol/L — ABNORMAL HIGH (ref 134–144)
Total Protein: 7.3 g/dL (ref 6.0–8.5)

## 2020-03-06 LAB — LIPID PANEL
Chol/HDL Ratio: 3.2 ratio (ref 0.0–4.4)
Cholesterol, Total: 220 mg/dL — ABNORMAL HIGH (ref 100–199)
HDL: 69 mg/dL (ref >39)
LDL Chol Calc (NIH): 139 mg/dL — ABNORMAL HIGH (ref 0–99)
Triglycerides: 70 mg/dL (ref 0–149)
VLDL Cholesterol Cal: 12 mg/dL (ref 5–40)

## 2020-03-06 LAB — CBC WITH DIFFERENTIAL/PLATELET
Basophils Absolute: 0 10*3/uL (ref 0.0–0.2)
Basos: 1 %
EOS (ABSOLUTE): 0.1 10*3/uL (ref 0.0–0.4)
Eos: 2 %
Hematocrit: 46.1 % (ref 34.0–46.6)
Hemoglobin: 15.6 g/dL (ref 11.1–15.9)
Immature Grans (Abs): 0 10*3/uL (ref 0.0–0.1)
Immature Granulocytes: 0 %
Lymphocytes Absolute: 1.6 10*3/uL (ref 0.7–3.1)
Lymphs: 28 %
MCH: 30.2 pg (ref 26.6–33.0)
MCHC: 33.8 g/dL (ref 31.5–35.7)
MCV: 89 fL (ref 79–97)
Monocytes Absolute: 0.5 10*3/uL (ref 0.1–0.9)
Monocytes: 10 %
Neutrophils Absolute: 3.3 10*3/uL (ref 1.4–7.0)
Neutrophils: 59 %
Platelets: 405 10*3/uL (ref 150–450)
RBC: 5.17 x10E6/uL (ref 3.77–5.28)
RDW: 13.3 % (ref 11.7–15.4)
WBC: 5.6 10*3/uL (ref 3.4–10.8)

## 2020-03-06 LAB — TSH: TSH: 1.88 u[IU]/mL (ref 0.450–4.500)

## 2020-05-13 ENCOUNTER — Other Ambulatory Visit: Payer: Medicare PPO

## 2020-07-22 ENCOUNTER — Ambulatory Visit (INDEPENDENT_AMBULATORY_CARE_PROVIDER_SITE_OTHER): Payer: Medicare HMO

## 2020-07-22 DIAGNOSIS — Z0000 Encounter for general adult medical examination without abnormal findings: Secondary | ICD-10-CM

## 2020-07-22 NOTE — Progress Notes (Signed)
Jennifer Cowan VISIT  07/22/2020  Telephone Visit Disclaimer This Medicare AWV was conducted by telephone due to national recommendations for restrictions regarding the COVID-19 Pandemic (e.g. social distancing).  I verified, using two identifiers, that I am speaking with Jennifer Cowan or their authorized healthcare agent. I discussed the limitations, risks, security, and privacy concerns of performing an evaluation and management service by telephone and the potential availability of an in-person appointment in the future. The patient expressed understanding and agreed to proceed.  Location of Patient: Home Location of Provider (nurse):  [facility]  Subjective:    Jennifer Cowan is a 75 y.o. female patient of [name], FNP who had a Medicare Annual Wellness Visit today via telephone. Jennifer Cowan is a very pleasant lady that resides in nearby [place]. 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 She has never been married or had any children and lives alone. She enjoys traveling and walks regularly for exercise.   Patient Care Team: [name], FNP as PCP - General (Family Medicine)  Advanced Directives 07/22/2020 05/23/2016 04/21/2016 04/05/2016 03/29/2016  Does Patient Have a Medical Advance Directive? Yes No No No No  Type of Advance Directive [place]  Does patient want to make changes to medical advance directive? No - Patient declined - - - -  Copy of [place] in Chart? No - copy requested - - - -  Would patient like information on creating a medical advance directive? No - Patient declined No - Patient declined No - Patient declined - [facility] Utilization Over the Past 12 Months: # of hospitalizations or ER visits: 0 # of surgeries: 0  Review of Systems    Patient reports that her overall health is unchanged compared to last year.  History obtained from chart  review  Patient Reported Readings (BP, Pulse, CBG, Weight, etc) none  Pain Assessment Pain : No/denies pain     Current Medications & Allergies (verified) Allergies as of 07/22/2020   No Known Allergies      Medication List        Accurate as of July 22, 2020  2:31 PM. If you have any questions, ask your nurse or doctor.          amLODipine 10 MG tablet Commonly known as: NORVASC TAKE 1 TABLET EVERY DAY   benazepril 10 MG tablet Commonly known as: LOTENSIN TAKE 1 TABLET EVERY DAY   ferrous sulfate 325 (65 FE) MG EC tablet Take 325 mg by mouth daily.   meloxicam 7.5 MG tablet Commonly known as: MOBIC TAKE 1 TABLET EVERY DAY   simvastatin 20 MG tablet Commonly known as: ZOCOR TAKE 1 TABLET EVERY DAY   Vitamin D3 125 MCG (5000 UT) Caps Take by mouth.        History (reviewed): Past Medical History:  Diagnosis Date   Anemia    Arthritis    Cancer ([place]) 1999   Breast Cancer   Hyperlipidemia    Hypertension    Past Surgical History:  Procedure Laterality Date   BREAST SURGERY  1999   Masectomy- left   CYSTECTOMY     cyst removed off top of head   Family History  Problem Relation Age of Onset   Arthritis Mother    Cancer Sister        Breast Cancer   Diabetes Sister  Hyperlipidemia Sister    Hypertension Sister    Stroke Sister    Diabetes Brother    Hypertension Brother    Social History   Socioeconomic History   Marital status: Single    Spouse name: Not on file   Number of children: 0   Years of education: 12   Highest education level: High school graduate  Occupational History   Occupation: retired  Tobacco Use   Smoking status: Never   Smokeless tobacco: Never  Scientific laboratory technician Use: Never used  Substance and Sexual Activity   Alcohol use: No   Drug use: Never   Sexual activity: Yes    Birth control/protection: Post-menopausal  Other Topics Concern   Not on file  Social History Narrative   Not on file   Social  Determinants of Health   Financial Resource Strain: Not on file  Food Insecurity: Not on file  Transportation Needs: Not on file  Physical Activity: Not on file  Stress: Not on file  Social Connections: Not on file    Activities of Daily Living In your present state of health, do you have any difficulty performing the following activities: 07/22/2020  Hearing? N  Vision? N  Difficulty concentrating or making decisions? N  Walking or climbing stairs? N  Dressing or bathing? N  Doing errands, shopping? N  Preparing Food and eating ? N  Using the Toilet? N  In the past six months, have you accidently leaked urine? N  Do you have problems with loss of bowel control? N  Managing your Medications? N  Managing your Finances? N  Housekeeping or managing your Housekeeping? N  Some recent data might be hidden    Patient Education/ Literacy How often do you need to have someone help you when you read instructions, pamphlets, or other written materials from your doctor or pharmacy?: 1 - Never What is the last grade level you completed in school?: 12th grade  Exercise Current Exercise Habits: The patient does not participate in regular exercise at present  Diet Patient reports consuming 3 meals a day and 2 snack(s) a day Patient reports that her primary diet is: Regular Patient reports that she does have regular access to food.   Depression Screen PHQ 2/9 Scores 07/22/2020 03/05/2020  PHQ - 2 Score 0 0     Fall Risk Fall Risk  07/22/2020 03/05/2020  Falls in the past year? 1 0  Number falls in past yr: 0 -  Injury with Fall? 0 -  Risk for fall due to : History of fall(s) -  Follow up Education provided -     Objective:  Jennifer Cowan seemed alert and oriented and she participated appropriately during our telephone visit.  Blood Pressure Weight BMI  BP Readings from Last 3 Encounters:  03/05/20 123/66   Wt Readings from Last 3 Encounters:  03/05/20 208 lb 4 oz (94.5 kg)   BMI  Readings from Last 1 Encounters:  03/05/20 31.66 kg/m    *Unable to obtain current vital signs, weight, and BMI due to telephone visit type  Hearing/Vision  Stanton Kidney did not seem to have difficulty with hearing/understanding during the telephone conversation Reports that she has had a formal eye exam by an eye care professional within the past year Reports that she has not had a formal hearing evaluation within the past year *Unable to fully assess hearing and vision during telephone visit type  Cognitive Function: 6CIT Screen 07/22/2020  What Year?  0 points  What month? 0 points  What time? 0 points  Count back from 20 0 points  Months in reverse 0 points  Repeat phrase 0 points  Total Score 0   (Normal:0-7, Significant for Dysfunction: >8)  Normal Cognitive Function Screening: Yes   Immunization & Health Maintenance Record Immunization History  Administered Date(s) Administered   Moderna Sars-Covid-2 Vaccination 10/29/2019, 11/26/2019    Health Maintenance  Topic Date Due   MAMMOGRAM  Never done   Zoster Vaccines- Shingrix (1 of 2) Never done   DEXA SCAN  Never done   COVID-19 Vaccine (3 - Booster for Moderna series) 04/25/2020   COLONOSCOPY (Pts 45-80yrs Insurance coverage will need to be confirmed)  03/05/2021 (Originally 10/11/1990)   TETANUS/TDAP  03/05/2021 (Originally 10/10/1964)   Hepatitis C Screening  03/05/2021 (Originally 10/11/1963)   PNA vac Low Risk Adult (1 of 2 - PCV13) 03/05/2021 (Originally 10/11/2010)   INFLUENZA VACCINE  08/17/2020   HPV VACCINES  Aged Out       Assessment  This is a routine wellness examination for [employer].  Health Maintenance: Due or Overdue Health Maintenance Due  Topic Date Due   MAMMOGRAM  Never done   Zoster Vaccines- Shingrix (1 of 2) Never done   DEXA SCAN  Never done   COVID-19 Vaccine (3 - Booster for Moderna series) 04/25/2020    Jennifer Cowan does not need a referral for Community Assistance: Care  Management:   no Social Work:    no Prescription Assistance:  no Nutrition/Diabetes Education:  no   Plan:  Personalized Goals  Goals Addressed             This Visit's Progress    DIET - EAT MORE FRUITS AND VEGETABLES       Exercise 150 min/wk Moderate Activity         Personalized Health Maintenance & Screening Recommendations  Shingrix vaccines  Lung Cancer Screening Recommended: no (Low Dose CT Chest recommended if Age 30-80 years, 30 pack-year currently smoking OR have quit w/in past 15 years) Hepatitis C Screening recommended: yes HIV Screening recommended: no  Advanced Directives: Written information was not prepared per patient's request.  Referrals & Orders No orders of the defined types were placed in this encounter.   Follow-up Plan Follow-up with [name], FNP as planned Schedule for regular follow up and Shingrix vaccines    I have personally reviewed and noted the following in the patient's chart:   Medical and social history Use of alcohol, tobacco or illicit drugs  Current medications and supplements Functional ability and status Nutritional status Physical activity Advanced directives List of other physicians Hospitalizations, surgeries, and ER visits in previous 12 months Vitals Screenings to include cognitive, depression, and falls Referrals and appointments  In addition, I have reviewed and discussed with Jennifer Cowan certain preventive protocols, quality metrics, and best practice recommendations. A written personalized care plan for preventive services as well as general preventive health recommendations is available and can be mailed to the patient at her request.      Rolena Infante LPN 07/23/8240

## 2020-07-22 NOTE — Patient Instructions (Signed)
  [name] , Thank you for taking time to come for your Medicare Wellness Visit. I appreciate your ongoing commitment to your health goals. Please review the following plan we discussed and let me know if I can assist you in the future.   These are the goals we discussed:  Goals      DIET - EAT MORE FRUITS AND VEGETABLES     Exercise 150 min/wk Moderate Activity        This is a list of the screening recommended for you and due dates:  Health Maintenance  Topic Date Due   Mammogram  Never done   Zoster (Shingles) Vaccine (1 of 2) Never done   DEXA scan (bone density measurement)  Never done   COVID-19 Vaccine (3 - Booster for Moderna series) 04/25/2020   Colon Cancer Screening  03/05/2021*   Tetanus Vaccine  03/05/2021*   Hepatitis C Screening: USPSTF Recommendation to screen - Ages 18-79 yo.  03/05/2021*   Pneumonia vaccines (1 of 2 - PCV13) 03/05/2021*   Flu Shot  08/17/2020   HPV Vaccine  Aged Out  *Topic was postponed. The date shown is not the original due date.

## 2020-08-12 ENCOUNTER — Other Ambulatory Visit: Payer: Medicare HMO

## 2020-09-22 ENCOUNTER — Other Ambulatory Visit: Payer: Self-pay | Admitting: Family Medicine

## 2020-09-22 DIAGNOSIS — Z853 Personal history of malignant neoplasm of breast: Secondary | ICD-10-CM

## 2020-09-22 DIAGNOSIS — Z1231 Encounter for screening mammogram for malignant neoplasm of breast: Secondary | ICD-10-CM

## 2020-09-28 ENCOUNTER — Inpatient Hospital Stay: Admission: RE | Admit: 2020-09-28 | Payer: Self-pay | Source: Ambulatory Visit

## 2020-09-28 ENCOUNTER — Other Ambulatory Visit: Payer: Medicare HMO

## 2020-10-09 ENCOUNTER — Telehealth: Payer: Self-pay

## 2020-10-09 NOTE — Telephone Encounter (Signed)
Called patient to make appointment for dxa and mammogram - no answer and no voicemail.

## 2021-01-04 ENCOUNTER — Telehealth: Payer: Self-pay | Admitting: Family Medicine

## 2021-01-04 NOTE — Telephone Encounter (Signed)
°  Prescription Request  01/04/2021  Is this a "Controlled Substance" medicine? no  Have you seen your PCP in the last 2 weeks? 07/22/20  If YES, route message to pool  -  If NO, patient needs to be scheduled for appointment.  What is the name of the medication or equipment? Simvastatin, amlodipine, meloxiacam, and benzaepril  Have you contacted your pharmacy to request a refill? no   Which [facility] you [facility]   Patient notified that their request is being sent to the clinical staff for review and that they should receive a response within 2 business days.

## 2021-01-04 NOTE — Telephone Encounter (Signed)
TC to pt needing refill on meds, she has not been seen since Feb, rtc in [place] notes was 3 mos, made pt appt for this Wed at 1 pm

## 2021-01-06 ENCOUNTER — Encounter: Payer: Self-pay | Admitting: Family Medicine

## 2021-01-06 ENCOUNTER — Ambulatory Visit (INDEPENDENT_AMBULATORY_CARE_PROVIDER_SITE_OTHER): Payer: Medicare HMO | Admitting: Family Medicine

## 2021-01-06 ENCOUNTER — Ambulatory Visit (INDEPENDENT_AMBULATORY_CARE_PROVIDER_SITE_OTHER): Payer: Medicare HMO

## 2021-01-06 VITALS — BP 127/64 | HR 93 | Temp 98.2°F | Ht 68.0 in | Wt 208.5 lb

## 2021-01-06 DIAGNOSIS — E6609 Other obesity due to excess calories: Secondary | ICD-10-CM | POA: Diagnosis not present

## 2021-01-06 DIAGNOSIS — E782 Mixed hyperlipidemia: Secondary | ICD-10-CM

## 2021-01-06 DIAGNOSIS — D509 Iron deficiency anemia, unspecified: Secondary | ICD-10-CM | POA: Diagnosis not present

## 2021-01-06 DIAGNOSIS — Z6831 Body mass index (BMI) 31.0-31.9, adult: Secondary | ICD-10-CM | POA: Diagnosis not present

## 2021-01-06 DIAGNOSIS — M159 Polyosteoarthritis, unspecified: Secondary | ICD-10-CM | POA: Diagnosis not present

## 2021-01-06 DIAGNOSIS — I10 Essential (primary) hypertension: Secondary | ICD-10-CM

## 2021-01-06 DIAGNOSIS — Z780 Asymptomatic menopausal state: Secondary | ICD-10-CM

## 2021-01-06 MED ORDER — BENAZEPRIL HCL 10 MG PO TABS: 10.0000 mg | ORAL_TABLET | Freq: Every day | ORAL | 1 refills | Status: DC

## 2021-01-06 MED ORDER — SIMVASTATIN 20 MG PO TABS: 20.0000 mg | ORAL_TABLET | Freq: Every day | ORAL | 1 refills | Status: DC

## 2021-01-06 MED ORDER — AMLODIPINE BESYLATE 10 MG PO TABS: 10.0000 mg | ORAL_TABLET | Freq: Every day | ORAL | 1 refills | Status: DC

## 2021-01-06 MED ORDER — MELOXICAM 7.5 MG PO TABS: 7.5000 mg | ORAL_TABLET | Freq: Every day | ORAL | 1 refills | Status: DC

## 2021-01-06 NOTE — Patient Instructions (Signed)

## 2021-01-06 NOTE — Progress Notes (Signed)
Established Patient Office Visit  Subjective:  Patient ID: Jennifer Cowan, female    DOB: 1945/03/15  Age: 75 y.o. MRN: [id number]  CC:  Chief Complaint  Patient presents with   Medical Management of Chronic Issues   Hypertension    HPI Jennifer Cowan presents for chronic follow up.   HTN Complaint with meds - Yes Current Medications - amlodipine 10 mg, benazepril 10 mg Pertinent ROS:  Headache - No Fatigue - No Visual Disturbances - No Chest pain - No Dyspnea - No Palpitations - No LE edema - No  She continues to take zocor for her cholesterol and mobic as needed for arthritis. She is also on a daily iron supplement for anemia.   BP Readings from Last 3 Encounters:  01/06/21 127/64  03/05/20 123/66   CMP Latest Ref Rng & Units 03/05/2020  Glucose 65 - 99 mg/dL 104(H)  BUN 8 - 27 mg/dL 11  Creatinine 0.57 - 1.00 mg/dL 0.61  Sodium 134 - 144 mmol/L 145(H)  Potassium 3.5 - 5.2 mmol/L 4.6  Chloride 96 - 106 mmol/L 103  CO2 20 - 29 mmol/L 20  Calcium 8.7 - 10.3 mg/dL 9.8  Total Protein 6.0 - 8.5 g/dL 7.3  Total Bilirubin 0.0 - 1.2 mg/dL 0.4  Alkaline Phos 44 - 121 IU/L 112  AST 0 - 40 IU/L 18  ALT 0 - 32 IU/L 16      Past Medical History:  Diagnosis Date   Anemia    Arthritis    Cancer ([place]) 1999   Breast Cancer   Hyperlipidemia    Hypertension     Past Surgical History:  Procedure Laterality Date   BREAST SURGERY  1999   Masectomy- left   CYSTECTOMY     cyst removed off top of head    Family History  Problem Relation Age of Onset   Arthritis Mother    Cancer Sister        Breast Cancer   Diabetes Sister    Hyperlipidemia Sister    Hypertension Sister    Stroke Sister    Diabetes Brother    Hypertension Brother     Social History   Socioeconomic History   Marital status: Single    Spouse name: Not on file   Number of children: 0   Years of education: 12   Highest education level: High school graduate  Occupational History    Occupation: retired  Tobacco Use   Smoking status: Never   Smokeless tobacco: Never  Scientific laboratory technician Use: Never used  Substance and Sexual Activity   Alcohol use: No   Drug use: Never   Sexual activity: Yes    Birth control/protection: Post-menopausal  Other Topics Concern   Not on file  Social History Narrative   Not on file   Social Determinants of Health   Financial Resource Strain: Not on file  Food Insecurity: Not on file  Transportation Needs: Not on file  Physical Activity: Not on file  Stress: Not on file  Social Connections: Not on file  Intimate Partner Violence: Not on file    Outpatient Medications Prior to Visit  Medication Sig Dispense Refill   amLODipine (NORVASC) 10 MG tablet TAKE 1 TABLET EVERY DAY 90 tablet 1   benazepril (LOTENSIN) 10 MG tablet TAKE 1 TABLET EVERY DAY 90 tablet 1   Cholecalciferol (VITAMIN D3) 125 MCG (5000 UT) CAPS Take by mouth.     ferrous sulfate 325 (65  FE) MG EC tablet Take 325 mg by mouth daily.     meloxicam (MOBIC) 7.5 MG tablet TAKE 1 TABLET EVERY DAY 90 tablet 1   simvastatin (ZOCOR) 20 MG tablet TAKE 1 TABLET EVERY DAY 90 tablet 1   No facility-administered medications prior to visit.    No Known Allergies  ROS Review of Systems Negative unless specially indicated above in HPI.   Objective:    Physical Exam Vitals and nursing note reviewed.  Constitutional:      General: She is not in acute distress.    Appearance: She is not ill-appearing, toxic-appearing or diaphoretic.  HENT:     Head: Normocephalic and atraumatic.  Neck:     Thyroid: No thyroid mass, thyromegaly or thyroid tenderness.     Vascular: No carotid bruit or JVD.  Cardiovascular:     Rate and Rhythm: Normal rate and regular rhythm.     Heart sounds: Normal heart sounds. No murmur heard. Pulmonary:     Effort: Pulmonary effort is normal. No respiratory distress.     Breath sounds: Normal breath sounds.  Abdominal:     General: Bowel sounds  are normal. There is no distension.     Palpations: Abdomen is soft.     Tenderness: There is no abdominal tenderness. There is no guarding or rebound.  Musculoskeletal:     Right lower leg: No edema.     Left lower leg: No edema.  Skin:    General: Skin is warm and dry.  Neurological:     General: No focal deficit present.     Mental Status: She is alert and oriented to person, place, and time.     Motor: No weakness.     Gait: Gait normal.  Psychiatric:        Mood and Affect: Mood normal.        Behavior: Behavior normal.    BP 127/64    Pulse 93    Temp 98.2 F (36.8 C) (Temporal)    Ht '5\' 8"'  (1.727 m)    Wt 208 lb 8 oz (94.6 kg)    BMI 31.70 kg/m  Wt Readings from Last 3 Encounters:  01/06/21 208 lb 8 oz (94.6 kg)  03/05/20 208 lb 4 oz (94.5 kg)     Health Maintenance Due  Topic Date Due   DEXA SCAN  Never done    There are no preventive care reminders to display for this patient.  Lab Results  Component Value Date   TSH 1.880 03/05/2020   Lab Results  Component Value Date   WBC 5.6 03/05/2020   HGB 15.6 03/05/2020   HCT 46.1 03/05/2020   MCV 89 03/05/2020   PLT 405 03/05/2020   Lab Results  Component Value Date   NA 145 (H) 03/05/2020   K 4.6 03/05/2020   CO2 20 03/05/2020   GLUCOSE 104 (H) 03/05/2020   BUN 11 03/05/2020   CREATININE 0.61 03/05/2020   BILITOT 0.4 03/05/2020   ALKPHOS 112 03/05/2020   AST 18 03/05/2020   ALT 16 03/05/2020   PROT 7.3 03/05/2020   ALBUMIN 4.5 03/05/2020   CALCIUM 9.8 03/05/2020   Lab Results  Component Value Date   CHOL 220 (H) 03/05/2020   Lab Results  Component Value Date   HDL 69 03/05/2020   Lab Results  Component Value Date   LDLCALC 139 (H) 03/05/2020   Lab Results  Component Value Date   TRIG 70 03/05/2020   Lab Results  Component Value Date   CHOLHDL 3.2 03/05/2020   No results found for: HGBA1C    Assessment & Plan:   Jennifer Cowan was seen today for medical management of chronic issues,  hypertension and hyperlipidemia.  Diagnoses and all orders for this visit:  Primary hypertension Well controlled on current regimen. Labs pending.  -     amLODipine (NORVASC) 10 MG tablet; Take 1 tablet (10 mg total) by mouth daily. -     benazepril (LOTENSIN) 10 MG tablet; Take 1 tablet (10 mg total) by mouth daily. -     CBC with Differential/Platelet -     CMP14+EGFR -     Lipid panel  Class 1 obesity due to excess calories with serious comorbidity and body mass index (BMI) of 31.0 to 31.9 in adult Diet and exercise. Labs pending.  -     CBC with Differential/Platelet -     CMP14+EGFR -     Lipid panel  Mixed hyperlipidemia On statin. Diet and exercise. Labs pending.  -     simvastatin (ZOCOR) 20 MG tablet; Take 1 tablet (20 mg total) by mouth daily. -     CMP14+EGFR -     Lipid panel  Iron deficiency anemia, unspecified iron deficiency anemia type On iron supplement.  -     CBC with Differential/Platelet  Primary osteoarthritis involving multiple joints Takes mobic prn.       -     meloxicam (MOBIC) 7.5 MG tablet; Take 1 tablet (7.5 mg total) by mouth daily.  Post-menopausal -     DG [facility] DEXA; Future   Follow-up: Return in about 6 months (around 07/07/2021) for chronic follow up.  The patient indicates understanding of these issues and agrees with the plan.    [name], FNP

## 2021-01-07 ENCOUNTER — Other Ambulatory Visit: Payer: Self-pay | Admitting: Family Medicine

## 2021-01-07 DIAGNOSIS — E782 Mixed hyperlipidemia: Secondary | ICD-10-CM

## 2021-01-07 DIAGNOSIS — M85852 Other specified disorders of bone density and structure, left thigh: Secondary | ICD-10-CM | POA: Diagnosis not present

## 2021-01-07 LAB — LIPID PANEL
Chol/HDL Ratio: 3.5 ratio (ref 0.0–4.4)
Cholesterol, Total: 253 mg/dL — ABNORMAL HIGH (ref 100–199)
HDL: 73 mg/dL (ref >39)
LDL Chol Calc (NIH): 164 mg/dL — ABNORMAL HIGH (ref 0–99)
Triglycerides: 91 mg/dL (ref 0–149)
VLDL Cholesterol Cal: 16 mg/dL (ref 5–40)

## 2021-01-07 LAB — CBC WITH DIFFERENTIAL/PLATELET
Basophils Absolute: 0 10*3/uL (ref 0.0–0.2)
Basos: 1 %
EOS (ABSOLUTE): 0.1 10*3/uL (ref 0.0–0.4)
Eos: 1 %
Hematocrit: 47.1 % — ABNORMAL HIGH (ref 34.0–46.6)
Hemoglobin: 15.7 g/dL (ref 11.1–15.9)
Immature Grans (Abs): 0 10*3/uL (ref 0.0–0.1)
Immature Granulocytes: 0 %
Lymphocytes Absolute: 1.5 10*3/uL (ref 0.7–3.1)
Lymphs: 24 %
MCH: 29.7 pg (ref 26.6–33.0)
MCHC: 33.3 g/dL (ref 31.5–35.7)
MCV: 89 fL (ref 79–97)
Monocytes Absolute: 0.5 10*3/uL (ref 0.1–0.9)
Monocytes: 8 %
Neutrophils Absolute: 4.2 10*3/uL (ref 1.4–7.0)
Neutrophils: 66 %
Platelets: 399 10*3/uL (ref 150–450)
RBC: 5.28 x10E6/uL (ref 3.77–5.28)
RDW: 13.1 % (ref 11.7–15.4)
WBC: 6.2 10*3/uL (ref 3.4–10.8)

## 2021-01-07 LAB — CMP14+EGFR
ALT: 20 IU/L (ref 0–32)
AST: 20 IU/L (ref 0–40)
Albumin/Globulin Ratio: 1.3 (ref 1.2–2.2)
Albumin: 4.5 g/dL (ref 3.7–4.7)
Alkaline Phosphatase: 123 IU/L — ABNORMAL HIGH (ref 44–121)
BUN/Creatinine Ratio: 16 (ref 12–28)
BUN: 11 mg/dL (ref 8–27)
Bilirubin Total: 0.4 mg/dL (ref 0.0–1.2)
CO2: 23 mmol/L (ref 20–29)
Calcium: 10.3 mg/dL (ref 8.7–10.3)
Chloride: 104 mmol/L (ref 96–106)
Creatinine, Ser: 0.7 mg/dL (ref 0.57–1.00)
Globulin, Total: 3.5 g/dL (ref 1.5–4.5)
Glucose: 114 mg/dL — ABNORMAL HIGH (ref 70–99)
Potassium: 4.7 mmol/L (ref 3.5–5.2)
Sodium: 141 mmol/L (ref 134–144)
Total Protein: 8 g/dL (ref 6.0–8.5)
eGFR: 90 mL/min/{1.73_m2} (ref >59)

## 2021-01-07 MED ORDER — SIMVASTATIN 40 MG PO TABS
40.0000 mg | ORAL_TABLET | Freq: Every day | ORAL | 1 refills | Status: DC
Start: 2021-01-07 — End: 2021-04-16

## 2021-01-07 NOTE — Progress Notes (Signed)
Pt calling about labs

## 2021-01-12 ENCOUNTER — Encounter: Payer: Self-pay | Admitting: Family Medicine

## 2021-01-12 DIAGNOSIS — M8580 Other specified disorders of bone density and structure, unspecified site: Secondary | ICD-10-CM | POA: Insufficient documentation

## 2021-01-12 HISTORY — DX: Other specified disorders of bone density and structure, unspecified site: M85.80

## 2021-02-08 DIAGNOSIS — Z0100 Encounter for examination of eyes and vision without abnormal findings: Secondary | ICD-10-CM | POA: Diagnosis not present

## 2021-02-08 DIAGNOSIS — H5200 Hypermetropia, unspecified eye: Secondary | ICD-10-CM | POA: Diagnosis not present

## 2021-02-08 DIAGNOSIS — E7800 Pure hypercholesterolemia, unspecified: Secondary | ICD-10-CM | POA: Diagnosis not present

## 2021-04-16 ENCOUNTER — Other Ambulatory Visit: Payer: Self-pay | Admitting: *Deleted

## 2021-04-16 DIAGNOSIS — M159 Polyosteoarthritis, unspecified: Secondary | ICD-10-CM

## 2021-04-16 DIAGNOSIS — I10 Essential (primary) hypertension: Secondary | ICD-10-CM

## 2021-04-16 DIAGNOSIS — E782 Mixed hyperlipidemia: Secondary | ICD-10-CM

## 2021-04-16 MED ORDER — SIMVASTATIN 40 MG PO TABS: 40.0000 mg | ORAL_TABLET | Freq: Every day | ORAL | 0 refills | Status: DC

## 2021-04-16 MED ORDER — BENAZEPRIL HCL 10 MG PO TABS: 10.0000 mg | ORAL_TABLET | Freq: Every day | ORAL | 0 refills | Status: DC

## 2021-04-16 MED ORDER — MELOXICAM 7.5 MG PO TABS: 7.5000 mg | ORAL_TABLET | Freq: Every day | ORAL | 0 refills | Status: DC

## 2021-04-16 MED ORDER — AMLODIPINE BESYLATE 10 MG PO TABS: 10.0000 mg | ORAL_TABLET | Freq: Every day | ORAL | 0 refills | Status: DC

## 2021-04-19 ENCOUNTER — Other Ambulatory Visit: Payer: Self-pay | Admitting: Family Medicine

## 2021-04-19 DIAGNOSIS — E782 Mixed hyperlipidemia: Secondary | ICD-10-CM

## 2021-04-19 DIAGNOSIS — I10 Essential (primary) hypertension: Secondary | ICD-10-CM

## 2021-04-19 DIAGNOSIS — M159 Polyosteoarthritis, unspecified: Secondary | ICD-10-CM

## 2021-04-19 DIAGNOSIS — M150 Primary generalized (osteo)arthritis: Secondary | ICD-10-CM

## 2021-06-09 ENCOUNTER — Other Ambulatory Visit: Payer: Self-pay | Admitting: Family Medicine

## 2021-06-09 DIAGNOSIS — Z1231 Encounter for screening mammogram for malignant neoplasm of breast: Secondary | ICD-10-CM

## 2021-06-15 ENCOUNTER — Ambulatory Visit
Admission: RE | Admit: 2021-06-15 | Discharge: 2021-06-15 | Disposition: A | Discharge status: Home / Self Care | Payer: Medicare Other | Source: Ambulatory Visit | Attending: Family Medicine | Admitting: Family Medicine

## 2021-06-15 ENCOUNTER — Other Ambulatory Visit: Payer: Self-pay | Admitting: Family Medicine

## 2021-06-15 DIAGNOSIS — Z1231 Encounter for screening mammogram for malignant neoplasm of breast: Secondary | ICD-10-CM | POA: Diagnosis not present

## 2021-06-15 HISTORY — DX: Malignant neoplasm of unspecified site of unspecified female breast: C50.919

## 2021-06-18 ENCOUNTER — Other Ambulatory Visit: Payer: Self-pay | Admitting: Family Medicine

## 2021-06-18 DIAGNOSIS — R928 Other abnormal and inconclusive findings on diagnostic imaging of breast: Secondary | ICD-10-CM

## 2021-06-24 ENCOUNTER — Telehealth: Payer: Self-pay | Admitting: Family Medicine

## 2021-06-24 NOTE — Telephone Encounter (Signed)
Spoke to patient - she would like to have done at the [facility] in [place] orders they will contact patient to set up appt.

## 2021-06-24 NOTE — Telephone Encounter (Signed)
Patient calling to discuss diagnostic mammogram. Please call back.

## 2021-07-07 ENCOUNTER — Ambulatory Visit (INDEPENDENT_AMBULATORY_CARE_PROVIDER_SITE_OTHER): Payer: Medicare Other | Admitting: Family Medicine

## 2021-07-07 ENCOUNTER — Encounter: Payer: Self-pay | Admitting: Family Medicine

## 2021-07-07 VITALS — BP 131/67 | HR 97 | Temp 98.0°F | Ht 68.0 in | Wt 196.2 lb

## 2021-07-07 DIAGNOSIS — M8580 Other specified disorders of bone density and structure, unspecified site: Secondary | ICD-10-CM | POA: Diagnosis not present

## 2021-07-07 DIAGNOSIS — E663 Overweight: Secondary | ICD-10-CM

## 2021-07-07 DIAGNOSIS — E782 Mixed hyperlipidemia: Secondary | ICD-10-CM | POA: Diagnosis not present

## 2021-07-07 DIAGNOSIS — D509 Iron deficiency anemia, unspecified: Secondary | ICD-10-CM

## 2021-07-07 DIAGNOSIS — E559 Vitamin D deficiency, unspecified: Secondary | ICD-10-CM | POA: Diagnosis not present

## 2021-07-07 DIAGNOSIS — R739 Hyperglycemia, unspecified: Secondary | ICD-10-CM | POA: Diagnosis not present

## 2021-07-07 DIAGNOSIS — I10 Essential (primary) hypertension: Secondary | ICD-10-CM | POA: Diagnosis not present

## 2021-07-07 NOTE — Patient Instructions (Signed)
Hypertension, Adult High blood pressure (hypertension) is when the force of blood pumping through the arteries is too strong. The arteries are the blood vessels that carry blood from the heart throughout the body. Hypertension forces the heart to work harder to pump blood and may cause arteries to become narrow or stiff. Untreated or uncontrolled hypertension can lead to a heart attack, heart failure, a stroke, kidney disease, and other problems. A blood pressure reading consists of a higher number over a lower number. Ideally, your blood pressure should be below 120/80. The first ("top") number is called the systolic pressure. It is a measure of the pressure in your arteries as your heart beats. The second ("bottom") number is called the diastolic pressure. It is a measure of the pressure in your arteries as the heart relaxes. What are the causes? The exact cause of this condition is not known. There are some conditions that result in high blood pressure. What increases the risk? Certain factors may make you more likely to develop high blood pressure. Some of these risk factors are under your control, including: Smoking. Not getting enough exercise or physical activity. Being overweight. Having too much fat, sugar, calories, or salt (sodium) in your diet. Drinking too much alcohol. Other risk factors include: Having a personal history of heart disease, diabetes, high cholesterol, or kidney disease. Stress. Having a family history of high blood pressure and high cholesterol. Having obstructive sleep apnea. Age. The risk increases with age. What are the signs or symptoms? High blood pressure may not cause symptoms. Very high blood pressure (hypertensive crisis) may cause: Headache. Fast or irregular heartbeats (palpitations). Shortness of breath. Nosebleed. Nausea and vomiting. Vision changes. Severe chest pain, dizziness, and seizures. How is this diagnosed? This condition is diagnosed by  measuring your blood pressure while you are seated, with your arm resting on a flat surface, your legs uncrossed, and your feet flat on the floor. The cuff of the blood pressure monitor will be placed directly against the skin of your upper arm at the level of your heart. Blood pressure should be measured at least twice using the same arm. Certain conditions can cause a difference in blood pressure between your right and left arms. If you have a high blood pressure reading during one visit or you have normal blood pressure with other risk factors, you may be asked to: Return on a different day to have your blood pressure checked again. Monitor your blood pressure at home for 1 week or longer. If you are diagnosed with hypertension, you may have other blood or imaging tests to help your health care provider understand your overall risk for other conditions. How is this treated? This condition is treated by making healthy lifestyle changes, such as eating healthy foods, exercising more, and reducing your alcohol intake. You may be referred for counseling on a healthy diet and physical activity. Your health care provider may prescribe medicine if lifestyle changes are not enough to get your blood pressure under control and if: Your systolic blood pressure is above 130. Your diastolic blood pressure is above 80. Your personal target blood pressure may vary depending on your medical conditions, your age, and other factors. Follow these instructions at home: Eating and drinking  Eat a diet that is high in fiber and potassium, and low in sodium, added sugar, and fat. An example of this eating plan is called the DASH diet. DASH stands for Dietary Approaches to Stop Hypertension. To eat this way: Eat   plenty of fresh fruits and vegetables. Try to fill one half of your plate at each meal with fruits and vegetables. Eat whole grains, such as whole-wheat pasta, brown rice, or whole-grain bread. Fill about one  fourth of your plate with whole grains. Eat or drink low-fat dairy products, such as skim milk or low-fat yogurt. Avoid fatty cuts of meat, processed or cured meats, and poultry with skin. Fill about one fourth of your plate with lean proteins, such as fish, chicken without skin, beans, eggs, or tofu. Avoid pre-made and processed foods. These tend to be higher in sodium, added sugar, and fat. Reduce your daily sodium intake. Many people with hypertension should eat less than 1,500 mg of sodium a day. Do not drink alcohol if: Your health care provider tells you not to drink. You are pregnant, may be pregnant, or are planning to become pregnant. If you drink alcohol: Limit how much you have to: 0-1 drink a day for women. 0-2 drinks a day for men. Know how much alcohol is in your drink. In the [place] one drink equals one 12 oz bottle of beer (355 mL), one 5 oz glass of wine (148 mL), or one 1 oz glass of hard liquor (44 mL). Lifestyle  Work with your health care provider to maintain a healthy body weight or to lose weight. Ask what an ideal weight is for you. Get at least 30 minutes of exercise that causes your heart to beat faster (aerobic exercise) most days of the week. Activities may include walking, swimming, or biking. Include exercise to strengthen your muscles (resistance exercise), such as Pilates or lifting weights, as part of your weekly exercise routine. Try to do these types of exercises for 30 minutes at least 3 days a week. Do not use any products that contain nicotine or tobacco. These products include cigarettes, chewing tobacco, and vaping devices, such as e-cigarettes. If you need help quitting, ask your health care provider. Monitor your blood pressure at home as told by your health care provider. Keep all follow-up visits. This is important. Medicines Take over-the-counter and prescription medicines only as told by your health care provider. Follow directions carefully. Blood  pressure medicines must be taken as prescribed. Do not skip doses of blood pressure medicine. Doing this puts you at risk for problems and can make the medicine less effective. Ask your health care provider about side effects or reactions to medicines that you should watch for. Contact a health care provider if you: Think you are having a reaction to a medicine you are taking. Have headaches that keep coming back (recurring). Feel dizzy. Have swelling in your ankles. Have trouble with your vision. Get help right away if you: Develop a severe headache or confusion. Have unusual weakness or numbness. Feel faint. Have severe pain in your chest or abdomen. Vomit repeatedly. Have trouble breathing. These symptoms may be an emergency. Get help right away. Call 911. Do not wait to see if the symptoms will go away. Do not drive yourself to the hospital. Summary Hypertension is when the force of blood pumping through your arteries is too strong. If this condition is not controlled, it may put you at risk for serious complications. Your personal target blood pressure may vary depending on your medical conditions, your age, and other factors. For most people, a normal blood pressure is less than 120/80. Hypertension is treated with lifestyle changes, medicines, or a combination of both. Lifestyle changes include losing weight, eating a healthy,   low-sodium diet, exercising more, and limiting alcohol. This information is not intended to replace advice given to you by your health care provider. Make sure you discuss any questions you have with your health care provider. Document Revised: 11/10/2020 Document Reviewed: 11/10/2020 Elsevier Patient Education  2023 [employer] Inc.  

## 2021-07-07 NOTE — Progress Notes (Signed)
Established Patient Office Visit  Subjective   Patient ID: Jennifer Cowan, female    DOB: 02-Jul-1945  Age: 76 y.o. MRN: [id number]  Chief Complaint  Patient presents with   Medical Management of Chronic Issues   Hyperlipidemia   Hypertension    Patient seen in clinic today for chronic follow up. Denies any changes in health since last visit Denies any anxiety or depression.  Hyperlipidemia This is a chronic problem. The current episode started more than 1 year ago. The problem is uncontrolled. Exacerbating diseases include hypothyroidism. Pertinent negatives include no chest pain, focal weakness, leg pain, myalgias or shortness of breath. Current antihyperlipidemic treatment includes statins. Compliance problems include adherence to exercise and adherence to diet.   Hypertension This is a chronic problem. The current episode started more than 1 year ago. The problem is controlled. Pertinent negatives include no blurred vision, chest pain, headaches, malaise/fatigue, neck pain, orthopnea, palpitations, peripheral edema or shortness of breath.    Past Medical History:  Diagnosis Date   Anemia    Arthritis    Breast cancer ([place])    Cancer ([place]) 1999   Breast Cancer   Hyperlipidemia    Hypertension    Osteopenia 01/12/2021      Review of Systems  Constitutional:  Positive for weight loss. Negative for chills, fever and malaise/fatigue.  HENT:  Negative for congestion, ear discharge, hearing loss and tinnitus.   Eyes:  Negative for blurred vision, double vision and photophobia.  Respiratory:  Negative for cough, sputum production and shortness of breath.   Cardiovascular:  Negative for chest pain, palpitations and orthopnea.  Gastrointestinal:  Negative for abdominal pain, constipation, diarrhea and heartburn.  Genitourinary:  Negative for dysuria, frequency and urgency.  Musculoskeletal:  Negative for back pain, joint pain, myalgias and neck pain.  Skin:  Negative for itching  and rash.  Neurological:  Negative for sensory change, focal weakness, weakness and headaches.  Endo/Heme/Allergies:  Negative for environmental allergies. Does not bruise/bleed easily.  Psychiatric/Behavioral:  Negative for depression and suicidal ideas. The patient is not nervous/anxious.       Objective:     BP 131/67   Pulse 97   Temp 98 F (36.7 C) (Temporal)   Ht _0  (1.727 m)   Wt 89 kg   SpO2 95%   BMI 29.84 kg/m  BP Readings from Last 3 Encounters:  07/07/21 131/67  01/06/21 127/64  03/05/20 123/66      Physical Exam Constitutional:      Appearance: Normal appearance.  HENT:     Head: Normocephalic and atraumatic.     Right Ear: Tympanic membrane, ear canal and external ear normal.     Left Ear: Tympanic membrane, ear canal and external ear normal.     Nose: Nose normal.     Mouth/Throat:     Mouth: Mucous membranes are moist.  Eyes:     Extraocular Movements: Extraocular movements intact.  Cardiovascular:     Rate and Rhythm: Normal rate and regular rhythm.     Pulses: Normal pulses.     Heart sounds: Normal heart sounds.  Pulmonary:     Effort: Pulmonary effort is normal.     Breath sounds: Normal breath sounds.  Abdominal:     General: Bowel sounds are normal.     Palpations: Abdomen is soft.  Musculoskeletal:        General: Normal range of motion.     Cervical back: Normal range of motion and neck  supple.  Skin:    General: Skin is warm and dry.     Capillary Refill: Capillary refill takes less than 2 seconds.  Neurological:     Mental Status: She is alert and oriented to person, place, and time.  Psychiatric:        Mood and Affect: Mood normal.        Behavior: Behavior normal.        Thought Content: Thought content normal.        Judgment: Judgment normal.      No results found for any visits on 07/07/21.    The 10-year ASCVD risk score (Arnett DK, et al., 2019) is: 21.4%    Assessment & Plan:   Jennifer Cowan was seen today for  medical management of chronic issues, hyperlipidemia and hypertension.  Diagnoses and all orders for this visit:  Primary hypertension Well controlled on current regimen.  -     CBC with Differential/Platelet -     CMP14+EGFR -     Lipid panel -     Thyroid Panel With TSH  Mixed hyperlipidemia Labs pending.  -     Lipid panel  Iron deficiency anemia, unspecified iron deficiency anemia type On iron supplement. Labs pending.  -     CBC with Differential/Platelet -     CMP14+EGFR  Overweight Diet and exercise. Labs pending.  -     CBC with Differential/Platelet -     CMP14+EGFR -     Lipid panel -     Thyroid Panel With TSH  Osteopenia, unspecified location -     VITAMIN D 25 Hydroxy (Vit-D Deficiency, Fractures)  Vitamin D deficiency Currently taking an 5000 iu OTC supplement.  -     VITAMIN D 25 Hydroxy (Vit-D Deficiency, Fractures)  Follow up in 6 months.   The patient indicates understanding of these issues and agrees with the plan.  Kathlen Mody, RN  I personally was present during the history, physical exam, and medical decision-making activities of this service and have verified that the service and findings are accurately documented in the nurse practitioner student's note.  Marjorie Smolder, FNP

## 2021-07-08 ENCOUNTER — Other Ambulatory Visit: Payer: Self-pay | Admitting: Family Medicine

## 2021-07-08 DIAGNOSIS — E782 Mixed hyperlipidemia: Secondary | ICD-10-CM

## 2021-07-08 LAB — CBC WITH DIFFERENTIAL/PLATELET
Basophils Absolute: 0 10*3/uL (ref 0.0–0.2)
Basos: 1 %
EOS (ABSOLUTE): 0.1 10*3/uL (ref 0.0–0.4)
Eos: 2 %
Hematocrit: 46.6 % (ref 34.0–46.6)
Hemoglobin: 15.7 g/dL (ref 11.1–15.9)
Immature Grans (Abs): 0 10*3/uL (ref 0.0–0.1)
Immature Granulocytes: 0 %
Lymphocytes Absolute: 1.6 10*3/uL (ref 0.7–3.1)
Lymphs: 26 %
MCH: 29.6 pg (ref 26.6–33.0)
MCHC: 33.7 g/dL (ref 31.5–35.7)
MCV: 88 fL (ref 79–97)
Monocytes Absolute: 0.6 10*3/uL (ref 0.1–0.9)
Monocytes: 10 %
Neutrophils Absolute: 3.7 10*3/uL (ref 1.4–7.0)
Neutrophils: 61 %
Platelets: 450 10*3/uL (ref 150–450)
RBC: 5.3 x10E6/uL — ABNORMAL HIGH (ref 3.77–5.28)
RDW: 12.9 % (ref 11.7–15.4)
WBC: 6.1 10*3/uL (ref 3.4–10.8)

## 2021-07-08 LAB — CMP14+EGFR
ALT: 12 IU/L (ref 0–32)
AST: 17 IU/L (ref 0–40)
Albumin/Globulin Ratio: 1.4 (ref 1.2–2.2)
Albumin: 4.6 g/dL (ref 3.7–4.7)
Alkaline Phosphatase: 102 IU/L (ref 44–121)
BUN/Creatinine Ratio: 13 (ref 12–28)
BUN: 9 mg/dL (ref 8–27)
Bilirubin Total: 0.4 mg/dL (ref 0.0–1.2)
CO2: 22 mmol/L (ref 20–29)
Calcium: 10.5 mg/dL — ABNORMAL HIGH (ref 8.7–10.3)
Chloride: 102 mmol/L (ref 96–106)
Creatinine, Ser: 0.67 mg/dL (ref 0.57–1.00)
Globulin, Total: 3.3 g/dL (ref 1.5–4.5)
Glucose: 100 mg/dL — ABNORMAL HIGH (ref 70–99)
Potassium: 4.4 mmol/L (ref 3.5–5.2)
Sodium: 141 mmol/L (ref 134–144)
Total Protein: 7.9 g/dL (ref 6.0–8.5)
eGFR: 91 mL/min/{1.73_m2} (ref >59)

## 2021-07-08 LAB — THYROID PANEL WITH TSH
Free Thyroxine Index: 3 (ref 1.2–4.9)
T3 Uptake Ratio: 32 % (ref 24–39)
T4, Total: 9.3 ug/dL (ref 4.5–12.0)
TSH: 1.8 u[IU]/mL (ref 0.450–4.500)

## 2021-07-08 LAB — LIPID PANEL
Chol/HDL Ratio: 3.1 ratio (ref 0.0–4.4)
Cholesterol, Total: 187 mg/dL (ref 100–199)
HDL: 60 mg/dL (ref >39)
LDL Chol Calc (NIH): 112 mg/dL — ABNORMAL HIGH (ref 0–99)
Triglycerides: 83 mg/dL (ref 0–149)
VLDL Cholesterol Cal: 15 mg/dL (ref 5–40)

## 2021-07-08 LAB — VITAMIN D 25 HYDROXY (VIT D DEFICIENCY, FRACTURES): Vit D, 25-Hydroxy: 64.6 ng/mL (ref 30.0–100.0)

## 2021-07-08 MED ORDER — EZETIMIBE 10 MG PO TABS: 10.0000 mg | ORAL_TABLET | Freq: Every day | ORAL | 3 refills | Status: DC

## 2021-07-09 ENCOUNTER — Telehealth: Payer: Self-pay | Admitting: Family Medicine

## 2021-07-12 LAB — HGB A1C W/O EAG: Hgb A1c MFr Bld: 5.9 % — ABNORMAL HIGH (ref 4.8–5.6)

## 2021-07-12 LAB — SPECIMEN STATUS REPORT

## 2021-07-24 ENCOUNTER — Inpatient Hospital Stay (HOSPITAL_COMMUNITY): Payer: Medicare Other

## 2021-07-24 ENCOUNTER — Emergency Department (HOSPITAL_COMMUNITY): Payer: Medicare Other

## 2021-07-24 ENCOUNTER — Encounter (HOSPITAL_COMMUNITY): Payer: Self-pay

## 2021-07-24 ENCOUNTER — Ambulatory Visit
Admission: EM | Admit: 2021-07-24 | Discharge: 2021-07-24 | Disposition: A | Discharge status: Other Acute Inpatient Hospital | Payer: Medicare Other | Source: Home / Self Care

## 2021-07-24 ENCOUNTER — Other Ambulatory Visit: Payer: Self-pay

## 2021-07-24 ENCOUNTER — Inpatient Hospital Stay (HOSPITAL_COMMUNITY)
Admission: EM | Admit: 2021-07-24 | Discharge: 2021-07-30 | DRG: 166 | Disposition: A | Discharge status: Home / Self Care | Payer: Medicare Other | Attending: Internal Medicine | Admitting: Internal Medicine

## 2021-07-24 DIAGNOSIS — R4182 Altered mental status, unspecified: Principal | ICD-10-CM

## 2021-07-24 DIAGNOSIS — C50911 Malignant neoplasm of unspecified site of right female breast: Secondary | ICD-10-CM | POA: Diagnosis not present

## 2021-07-24 DIAGNOSIS — N6312 Unspecified lump in the right breast, upper inner quadrant: Secondary | ICD-10-CM | POA: Diagnosis not present

## 2021-07-24 DIAGNOSIS — Z823 Family history of stroke: Secondary | ICD-10-CM

## 2021-07-24 DIAGNOSIS — E785 Hyperlipidemia, unspecified: Secondary | ICD-10-CM | POA: Diagnosis not present

## 2021-07-24 DIAGNOSIS — G9389 Other specified disorders of brain: Secondary | ICD-10-CM

## 2021-07-24 DIAGNOSIS — N6310 Unspecified lump in the right breast, unspecified quadrant: Secondary | ICD-10-CM | POA: Diagnosis not present

## 2021-07-24 DIAGNOSIS — C3412 Malignant neoplasm of upper lobe, left bronchus or lung: Secondary | ICD-10-CM | POA: Diagnosis not present

## 2021-07-24 DIAGNOSIS — R410 Disorientation, unspecified: Secondary | ICD-10-CM | POA: Insufficient documentation

## 2021-07-24 DIAGNOSIS — Z853 Personal history of malignant neoplasm of breast: Secondary | ICD-10-CM | POA: Diagnosis not present

## 2021-07-24 DIAGNOSIS — R918 Other nonspecific abnormal finding of lung field: Secondary | ICD-10-CM | POA: Diagnosis not present

## 2021-07-24 DIAGNOSIS — Z803 Family history of malignant neoplasm of breast: Secondary | ICD-10-CM | POA: Diagnosis not present

## 2021-07-24 DIAGNOSIS — G936 Cerebral edema: Secondary | ICD-10-CM | POA: Diagnosis present

## 2021-07-24 DIAGNOSIS — E782 Mixed hyperlipidemia: Secondary | ICD-10-CM | POA: Diagnosis present

## 2021-07-24 DIAGNOSIS — E1169 Type 2 diabetes mellitus with other specified complication: Secondary | ICD-10-CM | POA: Diagnosis present

## 2021-07-24 DIAGNOSIS — R5381 Other malaise: Secondary | ICD-10-CM

## 2021-07-24 DIAGNOSIS — Z79899 Other long term (current) drug therapy: Secondary | ICD-10-CM

## 2021-07-24 DIAGNOSIS — C801 Malignant (primary) neoplasm, unspecified: Secondary | ICD-10-CM | POA: Diagnosis not present

## 2021-07-24 DIAGNOSIS — I10 Essential (primary) hypertension: Secondary | ICD-10-CM | POA: Diagnosis not present

## 2021-07-24 DIAGNOSIS — Z9012 Acquired absence of left breast and nipple: Secondary | ICD-10-CM

## 2021-07-24 DIAGNOSIS — I700 Atherosclerosis of aorta: Secondary | ICD-10-CM | POA: Diagnosis not present

## 2021-07-24 DIAGNOSIS — Z83438 Family history of other disorder of lipoprotein metabolism and other lipidemia: Secondary | ICD-10-CM

## 2021-07-24 DIAGNOSIS — R4701 Aphasia: Secondary | ICD-10-CM | POA: Diagnosis not present

## 2021-07-24 DIAGNOSIS — K7689 Other specified diseases of liver: Secondary | ICD-10-CM | POA: Diagnosis not present

## 2021-07-24 DIAGNOSIS — D649 Anemia, unspecified: Secondary | ICD-10-CM | POA: Diagnosis not present

## 2021-07-24 DIAGNOSIS — Z7189 Other specified counseling: Secondary | ICD-10-CM | POA: Diagnosis not present

## 2021-07-24 DIAGNOSIS — M1990 Unspecified osteoarthritis, unspecified site: Secondary | ICD-10-CM | POA: Diagnosis not present

## 2021-07-24 DIAGNOSIS — E1159 Type 2 diabetes mellitus with other circulatory complications: Secondary | ICD-10-CM | POA: Diagnosis present

## 2021-07-24 DIAGNOSIS — C50919 Malignant neoplasm of unspecified site of unspecified female breast: Secondary | ICD-10-CM | POA: Diagnosis not present

## 2021-07-24 DIAGNOSIS — Z8249 Family history of ischemic heart disease and other diseases of the circulatory system: Secondary | ICD-10-CM

## 2021-07-24 DIAGNOSIS — Z515 Encounter for palliative care: Secondary | ICD-10-CM | POA: Diagnosis not present

## 2021-07-24 DIAGNOSIS — Z833 Family history of diabetes mellitus: Secondary | ICD-10-CM | POA: Diagnosis not present

## 2021-07-24 DIAGNOSIS — R590 Localized enlarged lymph nodes: Secondary | ICD-10-CM | POA: Diagnosis not present

## 2021-07-24 DIAGNOSIS — C7931 Secondary malignant neoplasm of brain: Secondary | ICD-10-CM | POA: Diagnosis present

## 2021-07-24 DIAGNOSIS — G939 Disorder of brain, unspecified: Secondary | ICD-10-CM | POA: Diagnosis not present

## 2021-07-24 LAB — COMPREHENSIVE METABOLIC PANEL
ALT: 18 U/L (ref 0–44)
AST: 19 U/L (ref 15–41)
Albumin: 3.6 g/dL (ref 3.5–5.0)
Alkaline Phosphatase: 71 U/L (ref 38–126)
Anion gap: 7 (ref 5–15)
BUN: 9 mg/dL (ref 8–23)
CO2: 24 mmol/L (ref 22–32)
Calcium: 9.4 mg/dL (ref 8.9–10.3)
Chloride: 108 mmol/L (ref 98–111)
Creatinine, Ser: 0.78 mg/dL (ref 0.44–1.00)
GFR, Estimated: >60 mL/min (ref >60)
Glucose, Bld: 115 mg/dL — ABNORMAL HIGH (ref 70–99)
Potassium: 3.6 mmol/L (ref 3.5–5.1)
Sodium: 139 mmol/L (ref 135–145)
Total Bilirubin: 0.6 mg/dL (ref 0.3–1.2)
Total Protein: 7 g/dL (ref 6.5–8.1)

## 2021-07-24 LAB — URINALYSIS, ROUTINE W REFLEX MICROSCOPIC
Bacteria, UA: NONE SEEN
Bilirubin Urine: NEGATIVE
Glucose, UA: NEGATIVE mg/dL
Hgb urine dipstick: NEGATIVE
Ketones, ur: 20 mg/dL — AB
Nitrite: NEGATIVE
Protein, ur: NEGATIVE mg/dL
Specific Gravity, Urine: 1.014 (ref 1.005–1.030)
pH: 5 (ref 5.0–8.0)

## 2021-07-24 LAB — CBC WITH DIFFERENTIAL/PLATELET
Abs Immature Granulocytes: 0.02 10*3/uL (ref 0.00–0.07)
Basophils Absolute: 0 10*3/uL (ref 0.0–0.1)
Basophils Relative: 1 %
Eosinophils Absolute: 0.1 10*3/uL (ref 0.0–0.5)
Eosinophils Relative: 1 %
HCT: 45 % (ref 36.0–46.0)
Hemoglobin: 14.5 g/dL (ref 12.0–15.0)
Immature Granulocytes: 0 %
Lymphocytes Relative: 22 %
Lymphs Abs: 1.2 10*3/uL (ref 0.7–4.0)
MCH: 29 pg (ref 26.0–34.0)
MCHC: 32.2 g/dL (ref 30.0–36.0)
MCV: 90 fL (ref 80.0–100.0)
Monocytes Absolute: 0.5 10*3/uL (ref 0.1–1.0)
Monocytes Relative: 9 %
Neutro Abs: 3.6 10*3/uL (ref 1.7–7.7)
Neutrophils Relative %: 67 %
Platelets: 450 10*3/uL — ABNORMAL HIGH (ref 150–400)
RBC: 5 MIL/uL (ref 3.87–5.11)
RDW: 13.3 % (ref 11.5–15.5)
WBC: 5.3 10*3/uL (ref 4.0–10.5)
nRBC: 0 % (ref 0.0–0.2)

## 2021-07-24 LAB — RAPID URINE DRUG SCREEN, HOSP PERFORMED
Amphetamines: NOT DETECTED
Barbiturates: NOT DETECTED
Benzodiazepines: NOT DETECTED
Cocaine: NOT DETECTED
Opiates: NOT DETECTED
Tetrahydrocannabinol: NOT DETECTED

## 2021-07-24 LAB — POCT URINALYSIS DIP (MANUAL ENTRY)
Glucose, UA: NEGATIVE mg/dL
Nitrite, UA: NEGATIVE
Spec Grav, UA: >=1.03 — AB (ref 1.010–1.025)
Urobilinogen, UA: 1 E.U./dL
pH, UA: 5.5 (ref 5.0–8.0)

## 2021-07-24 LAB — ETHANOL: Alcohol, Ethyl (B): <10 mg/dL (ref <10)

## 2021-07-24 LAB — LACTIC ACID, PLASMA: Lactic Acid, Venous: 1.5 mmol/L (ref 0.5–1.9)

## 2021-07-24 LAB — AMMONIA: Ammonia: 36 umol/L — ABNORMAL HIGH (ref 9–35)

## 2021-07-24 MED ORDER — ACETAMINOPHEN 325 MG PO TABS
650.0000 mg | ORAL_TABLET | Freq: Four times a day (QID) | ORAL | Status: DC | PRN
  Administered 2021-07-25 – 2021-07-27 (×3): 650 mg via ORAL
  Filled 2021-07-24 (×3): qty 2

## 2021-07-24 MED ORDER — AMLODIPINE BESYLATE 10 MG PO TABS
10.0000 mg | ORAL_TABLET | Freq: Every day | ORAL | Status: DC
  Administered 2021-07-25 – 2021-07-30 (×6): 10 mg via ORAL
  Filled 2021-07-24 (×6): qty 1

## 2021-07-24 MED ORDER — IOHEXOL 300 MG/ML  SOLN
80.0000 mL | Freq: Once | INTRAMUSCULAR | Status: AC | PRN
  Administered 2021-07-24: 80 mL via INTRAVENOUS

## 2021-07-24 MED ORDER — GADOBUTROL 1 MMOL/ML IV SOLN
8.5000 mL | Freq: Once | INTRAVENOUS | Status: AC | PRN
  Administered 2021-07-24: 8.5 mL via INTRAVENOUS

## 2021-07-24 MED ORDER — ACETAMINOPHEN 650 MG RE SUPP: 650.0000 mg | Freq: Four times a day (QID) | RECTAL | Status: DC | PRN

## 2021-07-24 MED ORDER — ENOXAPARIN SODIUM 40 MG/0.4ML IJ SOSY
40.0000 mg | PREFILLED_SYRINGE | INTRAMUSCULAR | Status: DC
  Administered 2021-07-24 – 2021-07-29 (×6): 40 mg via SUBCUTANEOUS
  Filled 2021-07-24 (×6): qty 0.4

## 2021-07-24 MED ORDER — DEXAMETHASONE SODIUM PHOSPHATE 4 MG/ML IJ SOLN
4.0000 mg | Freq: Four times a day (QID) | INTRAMUSCULAR | Status: AC
  Administered 2021-07-24 – 2021-07-27 (×12): 4 mg via INTRAVENOUS
  Filled 2021-07-24 (×12): qty 1

## 2021-07-24 MED ORDER — SIMVASTATIN 20 MG PO TABS
40.0000 mg | ORAL_TABLET | Freq: Every day | ORAL | Status: DC
Start: 2021-07-25 — End: 2021-07-25

## 2021-07-24 MED ORDER — SODIUM CHLORIDE 0.9% FLUSH
3.0000 mL | Freq: Two times a day (BID) | INTRAVENOUS | Status: DC
Start: 2021-07-24 — End: 2021-07-30
  Administered 2021-07-24 – 2021-07-30 (×12): 3 mL via INTRAVENOUS

## 2021-07-24 MED ORDER — ONDANSETRON HCL 4 MG PO TABS: 4.0000 mg | ORAL_TABLET | Freq: Four times a day (QID) | ORAL | Status: DC | PRN

## 2021-07-24 MED ORDER — SODIUM CHLORIDE 0.9 % IV SOLN
10.0000 mg | Freq: Once | INTRAVENOUS | Status: AC
  Administered 2021-07-24: 10 mg via INTRAVENOUS
  Filled 2021-07-24: qty 1

## 2021-07-24 MED ORDER — ONDANSETRON HCL 4 MG/2ML IJ SOLN: 4.0000 mg | Freq: Four times a day (QID) | INTRAMUSCULAR | Status: DC | PRN

## 2021-07-24 MED ORDER — BENAZEPRIL HCL 5 MG PO TABS
10.0000 mg | ORAL_TABLET | Freq: Every day | ORAL | Status: DC
  Administered 2021-07-25 – 2021-07-30 (×6): 10 mg via ORAL
  Filled 2021-07-24 (×6): qty 2

## 2021-07-24 MED ORDER — SENNOSIDES-DOCUSATE SODIUM 8.6-50 MG PO TABS
1.0000 | ORAL_TABLET | Freq: Every evening | ORAL | Status: DC | PRN
Start: 2021-07-24 — End: 2021-07-30

## 2021-07-24 NOTE — ED Notes (Signed)
Patient transported to MRI 

## 2021-07-24 NOTE — Discharge Instructions (Addendum)
Go to ER for further evaluation .  

## 2021-07-24 NOTE — H&P (Signed)
History and Physical    Jennifer Cowan JTT:[id number] DOB: 03-Apr-1945 DOA: 07/24/2021  PCP: [name], FNP  Patient coming from: Home  I have personally briefly reviewed patient's old medical records in [place]  Chief Complaint: Altered mental status  HPI: Jennifer Cowan is a 76 y.o. female with medical history significant for HTN, HLD, left breast cancer s/p mastectomy in 1999 who presented to the ED for evaluation of altered mental status.  History is limited from patient and is otherwise supplemented by EDP, chart review, and patient's sister and niece at bedside.  Family states that patient has had progressive functional decline since the end of May.  She has had decreased level of conversation, appetite, and has been ambulating slowly.  Symptoms have significantly worsened over the last week.  She has not had any falls or head trauma.  Family notes that she seems to be confused when they speak to her and does not answer questions appropriately at times.  Of note patient had a screening mammogram on 06/15/2021 which showed a mass with calcifications in the right breast.  Diagnostic mammogram and breast ultrasound were ordered but not yet completed.  ED Course  Labs/Imaging on admission: I have personally reviewed following labs and imaging studies.  Initial vitals showed BP 143/74, pulse 85, RR 16, temp 98.5 F, SPO2 99% on room air.  Labs show sodium 139, potassium 3.6, bicarb 24, BUN 9, creatinine 0.70, serum glucose 115, LFTs within normal limits, WBC 5.3, hemoglobin 14.5, platelets 450,000, ammonia 36, lactic acid 1.5, serum ethanol <10.  CT head without contrast shows at least 2 large mass lesions in the left frontal and right parietal temporal regions concerning for metastatic disease.  Extensive surrounding edema with regional mass effect noted.  Follow-up MRI brain, CT chest/abdomen/pelvis ordered and pending.  EDP discussed with on-call neurosurgery who  recommended IV Decadron 10 mg x 1 followed by 4 mg every 6 hours and they will see in consultation.  The hospitalist service was consulted to admit for further evaluation and management.  Review of Systems: All systems reviewed and are negative except as documented in history of present illness above.   Past Medical History:  Diagnosis Date   Anemia    Arthritis    Breast cancer ([place])    Cancer ([place]) 1999   Breast Cancer   Hyperlipidemia    Hypertension    Osteopenia 01/12/2021    Past Surgical History:  Procedure Laterality Date   BREAST SURGERY  1999   Masectomy- left   CYSTECTOMY     cyst removed off top of head   MASTECTOMY      Social History:  reports that she has never smoked. She has never used smokeless tobacco. She reports that she does not drink alcohol and does not use drugs.  No Known Allergies  Family History  Problem Relation Age of Onset   Arthritis Mother    Breast cancer Sister    Cancer Sister        Breast Cancer   Diabetes Sister    Hyperlipidemia Sister    Hypertension Sister    Stroke Sister    Diabetes Brother    Hypertension Brother      Prior to Admission medications   Medication Sig Start Date End Date Taking? Authorizing Provider  amLODipine (NORVASC) 10 MG tablet TAKE 1 TABLET BY MOUTH DAILY 04/19/21   [name], FNP  benazepril (LOTENSIN) 10 MG tablet TAKE 1 TABLET  BY MOUTH DAILY 04/19/21   [name], FNP  Cholecalciferol (VITAMIN D3) 125 MCG (5000 UT) CAPS Take by mouth.    [provider]  ezetimibe (ZETIA) 10 MG tablet Take 1 tablet (10 mg total) by mouth daily. 07/08/21   [name], FNP  ferrous sulfate 325 (65 FE) MG EC tablet Take 325 mg by mouth daily. 02/06/20 07/07/21  [provider]  meloxicam (MOBIC) 7.5 MG tablet TAKE 1 TABLET BY MOUTH DAILY 04/19/21   [name], FNP  simvastatin (ZOCOR) 40 MG tablet TAKE 1 TABLET BY MOUTH DAILY 04/19/21   [name], FNP    Physical  Exam: Vitals:   07/24/21 1227 07/24/21 1630 07/24/21 1633 07/24/21 1845  BP: (!) 143/74 (!) 146/70  139/71  Pulse: 85 78  76  Resp: 16 (!) 27  (!) 24  Temp: 98.5 F (36.9 C)     TempSrc: Oral     SpO2: 99% 95%  99%  Weight:   89 kg   Height:   5\' 8"  (1.727 m)    Constitutional: NAD, calm, comfortable Eyes: PERRL, lids and conjunctivae normal ENMT: Mucous membranes are moist. Posterior pharynx clear of any exudate or lesions.Normal dentition.  Neck: normal, supple, no masses. Respiratory: clear to auscultation bilaterally, no wheezing, no crackles. Normal respiratory effort. No accessory muscle use.  Cardiovascular: Regular rate and rhythm, no murmurs / rubs / gallops. No extremity edema. 2+ pedal pulses. Abdomen: no tenderness, no masses palpated. Musculoskeletal: no clubbing / cyanosis. No joint deformity upper and lower extremities. Good ROM, no contractures. Normal muscle tone.  Skin: no rashes, lesions, ulcers. No induration Neurologic: CN 2-12 grossly intact. Sensation intact. Strength 5/5 in all 4.  Psychiatric: Awake, alert, oriented to self and place but not year or situation.  EKG: Personally reviewed. Normal sinus rhythm without acute ischemic changes, QTc 460.  No prior for comparison.  Assessment/Plan Principal Problem:   Brain masses Active Problems:   Primary hypertension   Mixed hyperlipidemia   Jennifer Cowan is a 76 y.o. female with medical history significant for HTN, HLD, left breast cancer s/p mastectomy in 1999 who presented with progressive functional decline (confusion, diminished ambulation) found to have brain masses with surrounding edema and regional mass effect.  Started on IV Decadron per neurosurgery recommendations who will see in consultation.  Assessment and Plan: * Brain masses CT head showing at least 2 large mass lesions in the left frontal and right parietal temporal regions with extensive surrounding edema with regional mass effect likely  attributing to patient's presentation.  Concern is for metastatic disease.  Has history of left breast cancer s/p mastectomy 1999 with recent screening mammogram showing right breast mass with calcifications. -Follow-up MRI brain and CT chest/abdomen/pelvis -Neurosurgery to see in consultation -Started on IV Decadron 10 mg x 1 followed by 4 mg q6h  Primary hypertension Continue home amlodipine and benazepril.  Mixed hyperlipidemia Continue simvastatin.  DVT prophylaxis: enoxaparin (LOVENOX) injection 40 mg Start: 07/24/21 2000 Code Status: Full code, confirmed with patient's sister and niece at bedside Family Communication: Sister and niece at bedside Disposition Plan: From home, dispo pending clinical progress Consults called: Neurosurgery Severity of Illness: The appropriate patient status for this patient is INPATIENT. Inpatient status is judged to be reasonable and necessary in order to provide the required intensity of service to ensure the patient's safety. The patient's presenting symptoms, physical exam findings, and initial radiographic and laboratory data in the context of their  chronic comorbidities is felt to place them at high risk for further clinical deterioration. Furthermore, it is not anticipated that the patient will be medically stable for discharge from the hospital within 2 midnights of admission.   * I certify that at the point of admission it is my clinical judgment that the patient will require inpatient hospital care spanning beyond 2 midnights from the point of admission due to high intensity of service, high risk for further deterioration and high frequency of surveillance required.Zada Finders MD [facility]  If 7PM-7AM, please contact night-coverage [email or web address]  07/24/2021, 8:32 PM

## 2021-07-24 NOTE — Assessment & Plan Note (Signed)
Continue home amlodipine and benazepril.

## 2021-07-24 NOTE — ED Notes (Signed)
Patient is being discharged from the Urgent Care and sent to the Emergency Department via POV . Per KA, patient is in need of higher level of care due to AMS. Patient is aware and verbalizes understanding of plan of care.  Vitals:   07/24/21 1015  BP: (!) 144/80  Pulse: 83  Resp: 16  Temp: 97.7 F (36.5 C)  SpO2: 96%

## 2021-07-24 NOTE — Hospital Course (Signed)
Jennifer Cowan is a 76 y.o. female with medical history significant for HTN, HLD, left breast cancer s/p mastectomy in 1999 who presented with progressive functional decline (confusion, diminished ambulation). She underwent mammogram in May which was concerning for a right breast mass with calcifications.  She was recommended for biopsy but has not yet undergone prior to admission. She underwent work-up regarding her confusion.  Initial CT head showed 2 large brain lesions/masses with surrounding edema and regional mass effect.  She then underwent MRI brain for further evaluation.  This confirmed multiple intracranial masses, including 2 large lesions involving the left frontal and right temporal lobes with severe surrounding vasogenic edema.  Bilateral mass effect noted without herniation.  She was started on Decadron and neurosurgery was consulted. Extensive goals of care discussions were held the following morning with her family.  They are still discussing amongst themselves but they are amenable for at least consideration of brain radiation at this time. Palliative care also consulted along with medical oncology and radiation oncology.  IR consulted for discussion regarding right breast mass biopsy.

## 2021-07-24 NOTE — ED Provider Notes (Signed)
[place] EMERGENCY DEPARTMENT Provider Note   CSN: [id number] Arrival date & time: 07/24/21  1217     History {Add pertinent medical, surgical, social history, OB history to HPI:1} Chief Complaint  Patient presents with   Altered Mental Status    Jennifer Cowan is a 76 y.o. female.  HPI  Patient is a 76 year old female with a history of HTN, HLD, breast cancer (approximately 20 years ago, s/p Left mastectomy 1999), anemia who presents emergency department for evaluation of altered mental status.  Patient was initially evaluated in urgent care in the setting of altered mental status with decreased conversation and nutritional intake over the past 1 week.  No precipitating events have been identified and no similar symptoms in the past.  Patient is not currently on any blood thinning medications.  At bedside with both the patient's sister who lives with her, as well as the patient's niece.  According to the patient's sister since this past May the patient has been having increased confusion with conversation and decreased nutritional intake.  According to the niece these symptoms have been noticed over the past 2 weeks.  According to both the sister and the niece the patient has been slowed in her movements significantly over the past 1 week.  There have been no falls or direct head trauma.  Patient has not been complaining of any chest pain, shortness of breath, nausea, vomiting, dizziness.  Prior to this past week the patient was able to ambulate around on her own without issue.  Of note per the patient's niece she does have an upcoming mammogram this coming Tuesday.     Home Medications Prior to Admission medications   Medication Sig Start Date End Date Taking? Authorizing Provider  amLODipine (NORVASC) 10 MG tablet TAKE 1 TABLET BY MOUTH DAILY 04/19/21   [name], FNP  benazepril (LOTENSIN) 10 MG tablet TAKE 1 TABLET BY MOUTH DAILY 04/19/21   [name], FNP  Cholecalciferol (VITAMIN D3) 125 MCG (5000 UT) CAPS Take by mouth.    [provider]  ezetimibe (ZETIA) 10 MG tablet Take 1 tablet (10 mg total) by mouth daily. 07/08/21   [name], FNP  ferrous sulfate 325 (65 FE) MG EC tablet Take 325 mg by mouth daily. 02/06/20 07/07/21  [provider]  meloxicam (MOBIC) 7.5 MG tablet TAKE 1 TABLET BY MOUTH DAILY 04/19/21   [name], FNP  simvastatin (ZOCOR) 40 MG tablet TAKE 1 TABLET BY MOUTH DAILY 04/19/21   [name], FNP      Allergies    Patient has no known allergies.    Review of Systems   Review of Systems  Physical Exam Updated Vital Signs BP (!) 146/70   Pulse 78   Temp 98.5 F (36.9 C) (Oral)   Resp (!) 27   Ht 5\' 8"  (1.727 m)   Wt 89 kg   SpO2 95%   BMI 29.84 kg/m  Physical Exam Vitals and nursing note reviewed.  Constitutional:      General: She is not in acute distress.    Appearance: She is well-developed.  HENT:     Head: Normocephalic and atraumatic.  Eyes:     Conjunctiva/sclera: Conjunctivae normal.  Cardiovascular:     Rate and Rhythm: Normal rate and regular rhythm.     Heart sounds: No murmur heard. Pulmonary:     Effort: Pulmonary effort is normal. No respiratory distress.     Breath  sounds: Normal breath sounds.  Abdominal:     Palpations: Abdomen is soft.     Tenderness: There is no abdominal tenderness.  Musculoskeletal:        General: No swelling.     Cervical back: Neck supple.  Skin:    General: Skin is warm and dry.     Capillary Refill: Capillary refill takes less than 2 seconds.  Neurological:     Mental Status: She is alert and oriented to person, place, and time.     GCS: GCS eye subscore is 4. GCS verbal subscore is 4. GCS motor subscore is 6.     Cranial Nerves: Cranial nerves 2-12 are intact.     Sensory: Sensation is intact.     Motor: Weakness (4/5 strength in right lower extremity, 5/5 strength in left lower extremity.  Reflexes diminished  in right leg.) present.     Coordination: Finger-Nose-Finger Test normal.  Psychiatric:        Mood and Affect: Mood normal.     ED Results / Procedures / Treatments   Labs (all labs ordered are listed, but only abnormal results are displayed) Labs Reviewed  COMPREHENSIVE METABOLIC PANEL - Abnormal; Notable for the following components:      Result Value   Glucose, Bld 115 (*)    All other components within normal limits  CBC WITH DIFFERENTIAL/PLATELET - Abnormal; Notable for the following components:   Platelets 450 (*)    All other components within normal limits  AMMONIA - Abnormal; Notable for the following components:   Ammonia 36 (*)    All other components within normal limits  LACTIC ACID, PLASMA  ETHANOL  URINALYSIS, ROUTINE W REFLEX MICROSCOPIC  RAPID URINE DRUG SCREEN, HOSP PERFORMED  CBG MONITORING, ED    EKG EKG Interpretation  Date/Time:  Saturday July 24 2021 12:29:41 EDT Ventricular Rate:  83 PR Interval:  124 QRS Duration: 72 QT Interval:  392 QTC Calculation: 460 R Axis:   22 Text Interpretation: Normal sinus rhythm Abnormal ECG No previous ECGs available Confirmed by Kommor, Madison ([id number]) on 07/24/2021 4:52:59 PM  Radiology CT HEAD WO CONTRAST  Result Date: 07/24/2021 CLINICAL DATA:  Delirium EXAM: CT HEAD WITHOUT CONTRAST TECHNIQUE: Contiguous axial images were obtained from the base of the skull through the vertex without intravenous contrast. RADIATION DOSE REDUCTION: This exam was performed according to the departmental dose-optimization program which includes automated exposure control, adjustment of the mA and/or kV according to patient size and/or use of iterative reconstruction technique. COMPARISON:  None Available. FINDINGS: Brain: Approximately 2.5 cm mass lesion in the anterior left frontal lobe (series 3, image 17). Additional probable cystic/necrotic large mass lesion centered in the right parietal and temporal lobes. Extensive edema  surrounding both lesions with associated regional mass effect. No definite evidence of acute hemorrhage. There is effacement of the ventricles due to mass effect from the above lesions. No hydrocephalus. No evidence of acute large vascular territory infarct. Vascular: Calcific intracranial atherosclerosis. Skull: No acute fracture. Sinuses/Orbits: Clear visualized sinuses. Other: No mastoid effusions. IMPRESSION: At least 2 large mass lesions in the left frontal and right parietotemporal regions, concerning for metastatic disease but incompletely assessed on this study. Extensive surrounding edema with regional mass effect. Recommend MRI with contrast for confirmation and further evaluation. Electronically Signed   By: Margaretha Sheffield M.D.   On: 07/24/2021 13:58    Procedures Procedures  {Document cardiac monitor, telemetry assessment procedure when appropriate:1}  Medications Ordered in ED Medications  dexamethasone (DECADRON) 10 mg in sodium chloride 0.9 % 50 mL IVPB (has no administration in time range)  dexamethasone (DECADRON) injection 4 mg (has no administration in time range)    ED Course/ Medical Decision Making/ A&P                           Medical Decision Making Problems Addressed: Altered mental status, unspecified altered mental status type: complicated acute illness or injury with systemic symptoms that poses a threat to life or bodily functions  Amount and/or Complexity of Data Reviewed Independent Historian: guardian External Data Reviewed: labs and notes. Labs: ordered. Radiology: ordered. ECG/medicine tests: ordered.    Details: No acute ST or T wave abnormalities to suggest underlying ACS.  QTc 460.  Rate 83.  No evidence of heart block.  Risk Prescription drug management.   Patient is a 76 year old female presents emergency department as above.  On initial presentation patient's vital signs are stable and she is afebrile.  On physical exam patient was having  some weakness on the right leg with 4/5 strength as well as decreased reflexes.  Cranial nerve exam was intact.  Patient did had a slowed verbal response when prompted with questions.  Based on laboratory work did not show any evidence of leukocytosis or anemia.  CMP grossly unremarkable.  Ethanol within normal limits.  Lactate 1.5.  Ammonia slightly elevated at 36.    CT head showed 2 large mass lesions in the left frontal and right parietal temporal regions concerning for metastatic disease but incompletely assessed on this study.  Extensive surrounding edema with regional mass effect.  MRI was recommended for further confirmation and evaluation.  The initial findings of this CT were communicated with the patient, her sister, and her niece who were at bedside.  They were informed that an MRI would be needed to further detail the intracranial findings on CT.  {Document critical care time when appropriate:1} {Document review of labs and clinical decision tools ie heart score, Chads2Vasc2 etc:1}  {Document your independent review of radiology images, and any outside records:1} {Document your discussion with family members, caretakers, and with consultants:1} {Document social determinants of health affecting pt's care:1} {Document your decision making why or why not admission, treatments were needed:1} Final Clinical Impression(s) / ED Diagnoses Final diagnoses:  Altered mental status, unspecified altered mental status type    Rx / DC Orders ED Discharge Orders     None

## 2021-07-24 NOTE — ED Triage Notes (Signed)
Patient here with family member who reports increased confusion and increasing required assistance with ADLs. Patient alert to person only. Denies pain

## 2021-07-24 NOTE — ED Notes (Signed)
ED TO INPATIENT HANDOFF REPORT  ED Nurse Name and Phone #: Caryl Pina RN [phone]  S Name/Age/Gender Jennifer Cowan 76 y.o. female Room/Bed: 020C/020C  Code Status   Code Status: Full Code  Home/SNF/Other Home Patient oriented to: self and place Is this baseline? No   Triage Complete: Triage complete  Chief Complaint Brain mass [G93.89]  Triage Note Patient here with family member who reports increased confusion and increasing required assistance with ADLs. Patient alert to person only. Denies pain   Allergies No Known Allergies  Level of Care/Admitting Diagnosis ED Disposition     ED Disposition  Admit   Condition  --   Comment  Hospital Area: [facility] [[id number]]  Level of Care: Telemetry Medical [[id number]]  May admit patient to [facility] or [facility] if equivalent level of care is available:: No  Covid Evaluation: Asymptomatic - no recent exposure (last 10 days) testing not required  Diagnosis: Brain mass [327746]  Admitting Physician: Lenore Cordia [[id number]]  Attending Physician: Lenore Cordia [[id number]]  Certification:: I certify this patient will need inpatient services for at least 2 midnights  Estimated Length of Stay: 2          B Medical/Surgery History Past Medical History:  Diagnosis Date   Anemia    Arthritis    Breast cancer ([facility])    Cancer ([place]) 1999   Breast Cancer   Hyperlipidemia    Hypertension    Osteopenia 01/12/2021   Past Surgical History:  Procedure Laterality Date   BREAST SURGERY  1999   Masectomy- left   CYSTECTOMY     cyst removed off top of head   MASTECTOMY       A IV Location/Drains/Wounds Patient Lines/Drains/Airways Status     Active Line/Drains/Airways     Name Placement date Placement time Site Days   Peripheral IV 07/24/21 20 G 1.88" Right Antecubital 07/24/21  1931  Antecubital  less than 1            Intake/Output Last 24 hours No intake or output data in the 24 hours ending  07/24/21 1943  Labs/Imaging Results for orders placed or performed during the hospital encounter of 07/24/21 (from the past 48 hour(s))  Comprehensive metabolic panel     Status: Abnormal   Collection Time: 07/24/21 12:42 PM  Result Value Ref Range   Sodium 139 135 - 145 mmol/L   Potassium 3.6 3.5 - 5.1 mmol/L   Chloride 108 98 - 111 mmol/L   CO2 24 22 - 32 mmol/L   Glucose, Bld 115 (H) 70 - 99 mg/dL    Comment: Glucose reference range applies only to samples taken after fasting for at least 8 hours.   BUN 9 8 - 23 mg/dL   Creatinine, Ser 0.78 0.44 - 1.00 mg/dL   Calcium 9.4 8.9 - 10.3 mg/dL   Total Protein 7.0 6.5 - 8.1 g/dL   Albumin 3.6 3.5 - 5.0 g/dL   AST 19 15 - 41 U/L   ALT 18 0 - 44 U/L   Alkaline Phosphatase 71 38 - 126 U/L   Total Bilirubin 0.6 0.3 - 1.2 mg/dL   GFR, Estimated >60 >60 mL/min    Comment: (NOTE) Calculated using the CKD-EPI Creatinine Equation (2021)    Anion gap 7 5 - 15    Comment: Performed at [facility] [address] [place], [place] [address]  CBC with Differential/Platelet     Status: Abnormal  Collection Time: 07/24/21 12:42 PM  Result Value Ref Range   WBC 5.3 4.0 - 10.5 K/uL   RBC 5.00 3.87 - 5.11 MIL/uL   Hemoglobin 14.5 12.0 - 15.0 g/dL   HCT 45.0 36.0 - 46.0 %   MCV 90.0 80.0 - 100.0 fL   MCH 29.0 26.0 - 34.0 pg   MCHC 32.2 30.0 - 36.0 g/dL   RDW 13.3 11.5 - 15.5 %   Platelets 450 (H) 150 - 400 K/uL   nRBC 0.0 0.0 - 0.2 %   Neutrophils Relative % 67 %   Neutro Abs 3.6 1.7 - 7.7 K/uL   Lymphocytes Relative 22 %   Lymphs Abs 1.2 0.7 - 4.0 K/uL   Monocytes Relative 9 %   Monocytes Absolute 0.5 0.1 - 1.0 K/uL   Eosinophils Relative 1 %   Eosinophils Absolute 0.1 0.0 - 0.5 K/uL   Basophils Relative 1 %   Basophils Absolute 0.0 0.0 - 0.1 K/uL   Immature Granulocytes 0 %   Abs Immature Granulocytes 0.02 0.00 - 0.07 K/uL    Comment: Performed at [facility] [address] [place], [place] [address]  Ammonia      Status: Abnormal   Collection Time: 07/24/21 12:43 PM  Result Value Ref Range   Ammonia 36 (H) 9 - 35 umol/L    Comment: Performed at [facility], [place] [address], [place], [place] [address]  Lactic acid, plasma     Status: None   Collection Time: 07/24/21 12:43 PM  Result Value Ref Range   Lactic Acid, Venous 1.5 0.5 - 1.9 mmol/L    Comment: Performed at [facility] [address] [place], [place] [address]  Ethanol     Status: None   Collection Time: 07/24/21 12:43 PM  Result Value Ref Range   Alcohol, Ethyl (B) <10 <10 mg/dL    Comment: (NOTE) Lowest detectable limit for serum alcohol is 10 mg/dL.  For medical purposes only. Performed at [facility], [place] [address], [place], [place] [address]   Urinalysis, Routine w reflex microscopic Urine, Clean Catch     Status: Abnormal   Collection Time: 07/24/21  7:06 PM  Result Value Ref Range   Color, Urine YELLOW YELLOW   APPearance CLEAR CLEAR   Specific Gravity, Urine 1.014 1.005 - 1.030   pH 5.0 5.0 - 8.0   Glucose, UA NEGATIVE NEGATIVE mg/dL   Hgb urine dipstick NEGATIVE NEGATIVE   Bilirubin Urine NEGATIVE NEGATIVE   Ketones, ur 20 (A) NEGATIVE mg/dL   Protein, ur NEGATIVE NEGATIVE mg/dL   Nitrite NEGATIVE NEGATIVE   Leukocytes,Ua TRACE (A) NEGATIVE   RBC / HPF 0-5 0 - 5 RBC/hpf   WBC, UA 6-10 0 - 5 WBC/hpf   Bacteria, UA NONE SEEN NONE SEEN   Squamous Epithelial / LPF 0-5 0 - 5   Mucus PRESENT     Comment: Performed at [facility], [place] [address], [place],  [address]  Rapid urine drug screen (hospital performed)     Status: None   Collection Time: 07/24/21  7:06 PM  Result Value Ref Range   Opiates NONE DETECTED NONE DETECTED   Cocaine NONE DETECTED NONE DETECTED   Benzodiazepines NONE DETECTED NONE DETECTED   Amphetamines NONE DETECTED NONE DETECTED   Tetrahydrocannabinol NONE DETECTED NONE DETECTED   Barbiturates NONE DETECTED NONE DETECTED    Comment: (NOTE) DRUG SCREEN FOR  MEDICAL PURPOSES ONLY.  IF CONFIRMATION  IS NEEDED FOR ANY PURPOSE, NOTIFY LAB WITHIN 5 DAYS.  LOWEST DETECTABLE LIMITS FOR URINE DRUG SCREEN Drug Class                     Cutoff (ng/mL) Amphetamine and metabolites    1000 Barbiturate and metabolites    200 Benzodiazepine                 809 Tricyclics and metabolites     300 Opiates and metabolites        300 Cocaine and metabolites        300 THC                            50 Performed at [facility], [place] [address], [place], [place] [address]    CT HEAD WO CONTRAST  Result Date: 07/24/2021 CLINICAL DATA:  Delirium EXAM: CT HEAD WITHOUT CONTRAST TECHNIQUE: Contiguous axial images were obtained from the base of the skull through the vertex without intravenous contrast. RADIATION DOSE REDUCTION: This exam was performed according to the departmental dose-optimization program which includes automated exposure control, adjustment of the mA and/or kV according to patient size and/or use of iterative reconstruction technique. COMPARISON:  None Available. FINDINGS: Brain: Approximately 2.5 cm mass lesion in the anterior left frontal lobe (series 3, image 17). Additional probable cystic/necrotic large mass lesion centered in the right parietal and temporal lobes. Extensive edema surrounding both lesions with associated regional mass effect. No definite evidence of acute hemorrhage. There is effacement of the ventricles due to mass effect from the above lesions. No hydrocephalus. No evidence of acute large vascular territory infarct. Vascular: Calcific intracranial atherosclerosis. Skull: No acute fracture. Sinuses/Orbits: Clear visualized sinuses. Other: No mastoid effusions. IMPRESSION: At least 2 large mass lesions in the left frontal and right parietotemporal regions, concerning for metastatic disease but incompletely assessed on this study. Extensive surrounding edema with regional mass effect. Recommend MRI with contrast for confirmation and  further evaluation. Electronically Signed   By: Margaretha Sheffield M.D.   On: 07/24/2021 13:58    Pending Labs Unresulted Labs (From admission, onward)     Start     Ordered   07/25/21 0500  CBC  Tomorrow morning,   R        07/24/21 1911   07/25/21 2505  Basic metabolic panel  Tomorrow morning,   R        07/24/21 1911            Vitals/Pain Today's Vitals   07/24/21 1632 07/24/21 1632 07/24/21 1633 07/24/21 1845  BP:    139/71  Pulse:    76  Resp:    (!) 24  Temp:      TempSrc:      SpO2:    99%  Weight:   89 kg   Height:   5\' 8"  (1.727 m)   PainSc: 0-No pain 0-No pain      Isolation Precautions No active isolations  Medications Medications  dexamethasone (DECADRON) 10 mg in sodium chloride 0.9 % 50 mL IVPB (has no administration in time range)  dexamethasone (DECADRON) injection 4 mg (has no administration in time range)  enoxaparin (LOVENOX) injection 40 mg (has no administration in time range)  sodium chloride flush (NS) 0.9 % injection 3 mL (has no administration in time range)  acetaminophen (TYLENOL) tablet 650 mg (has no administration in time range)    Or  acetaminophen (TYLENOL) suppository 650 mg (has no administration in time range)  ondansetron (ZOFRAN) tablet 4 mg (has no administration in time range)    Or  ondansetron (ZOFRAN) injection 4 mg (has no administration in time range)  senna-docusate (Senokot-S) tablet 1 tablet (has no administration in time range)    Mobility walks with person assist Low fall risk   Focused Assessments Neuro Assessment    NIH Stroke Scale ( + Modified Stroke Scale Criteria)  Interval: Initial Level of Consciousness (1a.)   : Alert, keenly responsive LOC Questions (1b. )   +: Answers one question correctly (states month is september) LOC Commands (1c. )   + : Performs both tasks correctly Best Gaze (2. )  +: Normal Visual (3. )  +: No visual loss Facial Palsy (4. )    : Normal symmetrical movements Motor Arm,  Left (5a. )   +: No drift Motor Arm, Right (5b. )   +: No drift Motor Leg, Left (6a. )   +: No drift Motor Leg, Right (6b. )   +: Drift Limb Ataxia (7. ): Absent Sensory (8. )   +: Normal, no sensory loss Best Language (9. )   +: No aphasia (pt is slow to answer questions) Dysarthria (10. ): Normal Extinction/Inattention (11.)   +: No Abnormality Modified SS Total  +: 2 Complete NIHSS TOTAL: 2     Neuro Assessment: Exceptions to WDL Neuro Checks:   Initial (07/24/21 1633)  Last Documented NIHSS Modified Score: 2 (07/24/21 1633) Has TPA been given? No If patient is a Neuro Trauma and patient is going to OR before floor call report to [facility] nurse: [phone] or [phone]   R Recommendations: See Admitting Provider Note  Report given to:   Additional Notes:

## 2021-07-24 NOTE — ED Provider Triage Note (Signed)
Emergency Medicine Provider Triage Evaluation Note  Jennifer Cowan , a 76 y.o. female  was evaluated in triage.  Pt complains of altered mental status.  History is given by the patient's daughter at bedside.  She has 3 months of worsening mental status.  Daughter states that she needs help with basic functions of life including dressing and bathing.  This is new.  She has a history of breast cancer.  She was diagnosed with a recurrent mass and her daughter states that she thinks she has not getting me follow-up about this.  Patient thinks that she is currently in [place] and that it is October.  She is taciturn and does not answer questions otherwise.  Review of Systems  Positive: Change in mental status Negative: Fever  Physical Exam  BP (!) 143/74 (BP Location: Right Arm)   Pulse 85   Temp 98.5 F (36.9 C) (Oral)   Resp 16   SpO2 99%  Gen:   Awake, no distress   Resp:  Normal effort  MSK:   Moves extremities without difficulty  Other:  Disoriented  Medical Decision Making  Medically screening exam initiated at 12:41 PM.  Appropriate orders placed.  [name] was informed that the remainder of the evaluation will be completed by another provider, this initial triage assessment does not replace that evaluation, and the importance of remaining in the ED until their evaluation is complete.  Patient with 3 months of worsening decline, work-up initiated, seen at [facility] urgent care earlier and sent in for further evaluation.  Suspect that this is most likely related to dementia.  I discussed that with the patient's daughter and told her that we will work-up any medical cause at this time.   Margarita Mail, PA-C 07/24/21 1248

## 2021-07-24 NOTE — ED Triage Notes (Signed)
Pt presents with altered mental status X 1 week with a decrease in conversation, appetite and drinking.  One family member says it has progressing since march and that there is a family Hx of dementia & alzheimers.

## 2021-07-24 NOTE — ED Notes (Signed)
IV attempted x2, unsuccessful.

## 2021-07-24 NOTE — Assessment & Plan Note (Addendum)
Continue simvastatin.

## 2021-07-24 NOTE — ED Notes (Signed)
CT and MRI made aware of IV placement.

## 2021-07-24 NOTE — ED Provider Notes (Addendum)
[name]   Chief Complaint  Patient presents with   Altered Mental Status    SUBJECTIVE:  Jennifer Cowan is a 76 y.o. female w who presented to the urgent care for complaint of altered mental status with decreased conversation and nutritional intake for the past week.  Denies any precipitating event.  Denies abdominal/flank pain.    Denies any alleviating or aggravating factors.  Denies similar symptoms in the past.  Denies fever, chills, nausea, vomiting, abdominal pain, flank pain, abnormal vaginal discharge or bleeding, hematuria.    LMP: No LMP recorded. Patient is postmenopausal.  ROS: As in HPI.  All other pertinent ROS negative.     Past Medical History:  Diagnosis Date   Anemia    Arthritis    Breast cancer ([place])    Cancer ([place]) 1999   Breast Cancer   Hyperlipidemia    Hypertension    Osteopenia 01/12/2021   Past Surgical History:  Procedure Laterality Date   BREAST SURGERY  1999   Masectomy- left   CYSTECTOMY     cyst removed off top of head   MASTECTOMY     No Known Allergies No current facility-administered medications on file prior to encounter.   Current Outpatient Medications on File Prior to Encounter  Medication Sig Dispense Refill   amLODipine (NORVASC) 10 MG tablet TAKE 1 TABLET BY MOUTH DAILY 90 tablet 0   benazepril (LOTENSIN) 10 MG tablet TAKE 1 TABLET BY MOUTH DAILY 90 tablet 0   Cholecalciferol (VITAMIN D3) 125 MCG (5000 UT) CAPS Take by mouth.     ezetimibe (ZETIA) 10 MG tablet Take 1 tablet (10 mg total) by mouth daily. 90 tablet 3   ferrous sulfate 325 (65 FE) MG EC tablet Take 325 mg by mouth daily.     meloxicam (MOBIC) 7.5 MG tablet TAKE 1 TABLET BY MOUTH DAILY 90 tablet 0   simvastatin (ZOCOR) 40 MG tablet TAKE 1 TABLET BY MOUTH DAILY 90 tablet 0   Social History   Socioeconomic History   Marital status: Single    Spouse name: Not on file   Number of children: 0   Years of education: 12   Highest education level: High  school graduate  Occupational History   Occupation: retired  Tobacco Use   Smoking status: Never   Smokeless tobacco: Never  Vaping Use   Vaping Use: Never used  Substance and Sexual Activity   Alcohol use: No   Drug use: Never   Sexual activity: Yes    Birth control/protection: Post-menopausal  Other Topics Concern   Not on file  Social History Narrative   Not on file   Social Determinants of Health   Financial Resource Strain: Not on file  Food Insecurity: Not on file  Transportation Needs: Not on file  Physical Activity: Not on file  Stress: Not on file  Social Connections: Not on file  Intimate Partner Violence: Not on file   Family History  Problem Relation Age of Onset   Arthritis Mother    Breast cancer Sister    Cancer Sister        Breast Cancer   Diabetes Sister    Hyperlipidemia Sister    Hypertension Sister    Stroke Sister    Diabetes Brother    Hypertension Brother     OBJECTIVE:  Vitals:   07/24/21 1015  BP: (!) 144/80  Pulse: 83  Resp: 16  Temp: 97.7 F (36.5 C)  TempSrc: Oral  SpO2: 96%   Physical Exam Vitals and nursing note reviewed.  Constitutional:      General: She is not in acute distress.    Appearance: Normal appearance. She is normal weight. She is ill-appearing. She is not toxic-appearing or diaphoretic.  HENT:     Head: Normocephalic.  Eyes:     General: No visual field deficit. Cardiovascular:     Rate and Rhythm: Normal rate and regular rhythm.     Pulses: Normal pulses.     Heart sounds: Normal heart sounds. No murmur heard.    No friction rub. No gallop.  Pulmonary:     Effort: Pulmonary effort is normal. No respiratory distress.     Breath sounds: Normal breath sounds. No stridor. No wheezing, rhonchi or rales.  Chest:     Chest wall: No tenderness.  Neurological:     Mental Status: She is alert.     GCS: GCS eye subscore is 4. GCS verbal subscore is 5. GCS motor subscore is 6.     Cranial Nerves: No cranial  nerve deficit or facial asymmetry.     Sensory: Sensation is intact.     Motor: Weakness present.     Coordination: Finger-Nose-Finger Test abnormal.      Labs Reviewed  POCT URINALYSIS DIP (MANUAL ENTRY) - Abnormal; Notable for the following components:      Result Value   Color, UA other (*)    Clarity, UA cloudy (*)    Bilirubin, UA small (*)    Ketones, POC UA moderate (40) (*)    Spec Grav, UA >=1.030 (*)    Blood, UA trace-lysed (*)    Protein Ur, POC trace (*)    Leukocytes, UA Small (1+) (*)    All other components within normal limits  URINE CULTURE   Due to patient age she was to go to ER for further evaluation.Daughter will take her.  Differential diagnosis included UTI, dehydration, and other neurological disorder.  Urinalysis is positive for UTI  ASSESSMENT & PLAN:  1. Disorientation     No orders of the defined types were placed in this encounter.   Discharge Instructions  Go to ER for Further evaluations   Outlined signs and symptoms indicating need for more acute intervention. Patient verbalized understanding. After Visit Summary given.      Emerson Monte, FNP 07/24/21 1117    Emerson Monte, FNP 07/24/21 1118

## 2021-07-24 NOTE — Assessment & Plan Note (Signed)
CT head showing at least 2 large mass lesions in the left frontal and right parietal temporal regions with extensive surrounding edema with regional mass effect likely attributing to patient's presentation.  Concern is for metastatic disease.  Has history of left breast cancer s/p mastectomy 1999 with recent screening mammogram showing right breast mass with calcifications. -Follow-up MRI brain and CT chest/abdomen/pelvis -Neurosurgery to see in consultation -Started on IV Decadron 10 mg x 1 followed by 4 mg q6h

## 2021-07-25 DIAGNOSIS — N6310 Unspecified lump in the right breast, unspecified quadrant: Secondary | ICD-10-CM | POA: Diagnosis not present

## 2021-07-25 DIAGNOSIS — R4182 Altered mental status, unspecified: Secondary | ICD-10-CM

## 2021-07-25 DIAGNOSIS — C50919 Malignant neoplasm of unspecified site of unspecified female breast: Secondary | ICD-10-CM | POA: Insufficient documentation

## 2021-07-25 DIAGNOSIS — R918 Other nonspecific abnormal finding of lung field: Secondary | ICD-10-CM | POA: Diagnosis present

## 2021-07-25 DIAGNOSIS — Z515 Encounter for palliative care: Secondary | ICD-10-CM

## 2021-07-25 DIAGNOSIS — G9389 Other specified disorders of brain: Secondary | ICD-10-CM | POA: Diagnosis not present

## 2021-07-25 DIAGNOSIS — Z7189 Other specified counseling: Secondary | ICD-10-CM

## 2021-07-25 LAB — BASIC METABOLIC PANEL
Anion gap: 14 (ref 5–15)
BUN: 9 mg/dL (ref 8–23)
CO2: 22 mmol/L (ref 22–32)
Calcium: 9.7 mg/dL (ref 8.9–10.3)
Chloride: 104 mmol/L (ref 98–111)
Creatinine, Ser: 0.66 mg/dL (ref 0.44–1.00)
GFR, Estimated: >60 mL/min (ref >60)
Glucose, Bld: 154 mg/dL — ABNORMAL HIGH (ref 70–99)
Potassium: 3.5 mmol/L (ref 3.5–5.1)
Sodium: 140 mmol/L (ref 135–145)

## 2021-07-25 LAB — CBC
HCT: 44.6 % (ref 36.0–46.0)
Hemoglobin: 14.6 g/dL (ref 12.0–15.0)
MCH: 29.1 pg (ref 26.0–34.0)
MCHC: 32.7 g/dL (ref 30.0–36.0)
MCV: 89 fL (ref 80.0–100.0)
Platelets: 400 10*3/uL (ref 150–400)
RBC: 5.01 MIL/uL (ref 3.87–5.11)
RDW: 13.5 % (ref 11.5–15.5)
WBC: 6.7 10*3/uL (ref 4.0–10.5)
nRBC: 0 % (ref 0.0–0.2)

## 2021-07-25 LAB — URINE CULTURE: Culture: 30000 — AB

## 2021-07-25 NOTE — Assessment & Plan Note (Addendum)
-   CT shows 3.6 cm necrotic mass involving left upper lobe.  Concern is for metastasis from primary breast cancer - see discussions above - asymptomatic in terms of SOB/cough; on RA

## 2021-07-25 NOTE — Progress Notes (Signed)
Progress Note    Jennifer Cowan   SKA:[id number]  DOB: May 15, 1945  DOA: 07/24/2021     1 PCP: [name], FNP  Initial CC: AMS, weakness  Hospital Course: Jennifer Cowan is a 76 y.o. female with medical history significant for HTN, HLD, left breast cancer s/p mastectomy in 1999 who presented with progressive functional decline (confusion, diminished ambulation). She underwent mammogram in May which was concerning for a right breast mass with calcifications.  She was recommended for biopsy but has not yet undergone prior to admission. She underwent work-up regarding her confusion.  Initial CT head showed 2 large brain lesions/masses with surrounding edema and regional mass effect.  She then underwent MRI brain for further evaluation.  This confirmed multiple intracranial masses, including 2 large lesions involving the left frontal and right temporal lobes with severe surrounding vasogenic edema.  Bilateral mass effect noted without herniation.  She was started on Decadron and neurosurgery was consulted. Extensive goals of care discussions were held the following morning with her family.  They are still discussing amongst themselves but they are amenable for at least consideration of brain radiation at this time. Palliative care also consulted along with medical oncology and radiation oncology.  IR consulted for discussion regarding right breast mass biopsy.  Interval History:  Seen this morning multiple times. Her niece is bedside Programme researcher, broadcasting/film/video) and spoke with her sister, Jennifer Cowan on the phone as well.  I also reviewed all imaging studies with them via video chat to show size of the masses. They seem to be realistic in terms of not pursuing very aggressive care but they are leaning towards wanting at least a possible tissue diagnosis and/or some brain radiation to see if it helps any of her altered mentation. They will continue discussing amongst family regarding their final decisions. In the  meantime, I have reached out to the appropriate specialists and radiology to get things lined up and planned on.  Assessment and Plan: * Brain masses - MRI brain confirms multiple intracranial masses.  The largest to involve the left frontal and right temporal lobes (right temporal largest).  There is surrounding vasogenic edema and bilateral mass effect, no herniation - Main differential at this time includes primary right breast cancer with metastasis to the left upper lung and brain mets - Evaluated by neurosurgery, greatly appreciate assistance.  Surgical intervention would be high risk and is not recommended nor lead to good quality outcomes given clinical presentation - Family discussing in depth amongst themselves as patient is not able to largely interact or show ability to make informed decisions; currently they are considering brain radiation but still also possibility of pure comfort care/hospice -I have consulted palliative care for further assistance with Jennifer Cowan discussions - I have also consulted medical oncology, radiation oncology (Dr. Lisbeth Renshaw), and IR to discuss if right breast biopsy is possible which would aid radiation decisions -Continue Decadron for now and focusing mostly on patient comfort if she does become more symptomatic; family seems to understand poor prognosis overall and that aggressive care not likely to make a big difference  Breast mass, right - Noted initially on outpatient mammogram in May 2023.  Patient has not yet undergone biopsy prior to this time - Assumption is her clinical presentation most consistent with primary breast cancer now with mets to the lung and brain - Oncology aware - Discussed with IR.  We are trying to see if able to get mammography to assist with biopsy of right breast  on Monday.  Family is amenable with obtaining a tissue diagnosis as well to help guide their decisions and also to possibly guide radiation oncology for brain radiation  Mass of  upper lobe of left lung - CT shows 3.6 cm necrotic mass involving left upper lobe.  Concern is for metastasis from primary breast cancer - see discussions above - asymptomatic in terms of SOB/cough; on RA  Mixed hyperlipidemia - Discontinue statin given no further mortality benefit in setting of presumed stage IV cancer   Primary hypertension Continue home amlodipine and benazepril.    Old records reviewed in assessment of this patient  Antimicrobials:   DVT prophylaxis:  enoxaparin (LOVENOX) injection 40 mg Start: 07/24/21 2000   Code Status:   Code Status: Full Code  Mobility Assessment (last 72 hours)     Mobility Assessment     Row Name 07/25/21 1023 07/24/21 2236         Does patient have an order for bedrest or is patient medically unstable No - Continue assessment No - Continue assessment      What is the highest level of mobility based on the progressive mobility assessment? Level 4 (Walks with assist in room) - Balance while marching in place and cannot step forward and back - Complete Level 4 (Walks with assist in room) - Balance while marching in place and cannot step forward and back - Complete               Disposition Plan: Pending further work-up and family decisions Status is: Inpatient  Objective: Blood pressure 134/73, pulse 73, temperature 98 F (36.7 C), temperature source Oral, resp. rate 17, height 5' 7.99" (1.727 m), weight 88.7 kg, SpO2 99 %.  Examination:  Physical Exam Exam conducted with a chaperone present.  Constitutional:      Comments: Chronically ill-appearing elderly woman lying in bed in no distress but is grossly confused.  Oriented only to name  HENT:     Head: Normocephalic and atraumatic.     Mouth/Throat:     Mouth: Mucous membranes are moist.  Eyes:     Extraocular Movements: Extraocular movements intact.  Cardiovascular:     Rate and Rhythm: Normal rate and regular rhythm.  Pulmonary:     Effort: Pulmonary effort is  normal.     Breath sounds: Normal breath sounds.  Chest:     Comments: Right breast examined with nurse chaperone in the room.  Palpable hard mass noted in right lateral breast.  Nontender on exam Abdominal:     General: Bowel sounds are normal. There is no distension.     Palpations: Abdomen is soft.     Tenderness: There is no abdominal tenderness.  Musculoskeletal:        General: No swelling.     Cervical back: Normal range of motion and neck supple.  Skin:    General: Skin is warm and dry.  Neurological:     Mental Status: She is disoriented.     Comments: Follows commands and moves all 4 extremities but oriented only to name      Consultants:  Neurosurgery Palliative care medicine Medical oncology Radiation oncology Interventional radiology  Procedures:    Data Reviewed: Results for orders placed or performed during the hospital encounter of 07/24/21 (from the past 24 hour(s))  Urinalysis, Routine w reflex microscopic Urine, Clean Catch     Status: Abnormal   Collection Time: 07/24/21  7:06 PM  Result Value Ref Range  Color, Urine YELLOW YELLOW   APPearance CLEAR CLEAR   Specific Gravity, Urine 1.014 1.005 - 1.030   pH 5.0 5.0 - 8.0   Glucose, UA NEGATIVE NEGATIVE mg/dL   Hgb urine dipstick NEGATIVE NEGATIVE   Bilirubin Urine NEGATIVE NEGATIVE   Ketones, ur 20 (A) NEGATIVE mg/dL   Protein, ur NEGATIVE NEGATIVE mg/dL   Nitrite NEGATIVE NEGATIVE   Leukocytes,Ua TRACE (A) NEGATIVE   RBC / HPF 0-5 0 - 5 RBC/hpf   WBC, UA 6-10 0 - 5 WBC/hpf   Bacteria, UA NONE SEEN NONE SEEN   Squamous Epithelial / LPF 0-5 0 - 5   Mucus PRESENT   Rapid urine drug screen (hospital performed)     Status: None   Collection Time: 07/24/21  7:06 PM  Result Value Ref Range   Opiates NONE DETECTED NONE DETECTED   Cocaine NONE DETECTED NONE DETECTED   Benzodiazepines NONE DETECTED NONE DETECTED   Amphetamines NONE DETECTED NONE DETECTED   Tetrahydrocannabinol NONE DETECTED NONE  DETECTED   Barbiturates NONE DETECTED NONE DETECTED  CBC     Status: None   Collection Time: 07/25/21  1:11 AM  Result Value Ref Range   WBC 6.7 4.0 - 10.5 K/uL   RBC 5.01 3.87 - 5.11 MIL/uL   Hemoglobin 14.6 12.0 - 15.0 g/dL   HCT 44.6 36.0 - 46.0 %   MCV 89.0 80.0 - 100.0 fL   MCH 29.1 26.0 - 34.0 pg   MCHC 32.7 30.0 - 36.0 g/dL   RDW 13.5 11.5 - 15.5 %   Platelets 400 150 - 400 K/uL   nRBC 0.0 0.0 - 0.2 %  Basic metabolic panel     Status: Abnormal   Collection Time: 07/25/21  1:11 AM  Result Value Ref Range   Sodium 140 135 - 145 mmol/L   Potassium 3.5 3.5 - 5.1 mmol/L   Chloride 104 98 - 111 mmol/L   CO2 22 22 - 32 mmol/L   Glucose, Bld 154 (H) 70 - 99 mg/dL   BUN 9 8 - 23 mg/dL   Creatinine, Ser 0.66 0.44 - 1.00 mg/dL   Calcium 9.7 8.9 - 10.3 mg/dL   GFR, Estimated >60 >60 mL/min   Anion gap 14 5 - 15    I have Reviewed nursing notes, Vitals, and Lab results since pt's last encounter. Pertinent lab results : see above I have ordered test including BMP, CBC, Mg I have reviewed the last note from staff over past 24 hours I have discussed pt's care plan and test results with nursing staff, case manager   LOS: 1 day   Dwyane Dee, MD [facility] 07/25/2021, 1:35 PM

## 2021-07-25 NOTE — Consult Note (Signed)
Palliative Care Consult Note                                  Date: 07/25/2021   Patient Name: Jennifer Cowan  DOB: [birth year]  MRN: [id number]  Age / Sex: 76 y.o., female  PCP: [name], FNP Referring Physician: Dwyane Dee, MD  Reason for Consultation: Establishing goals of care  HPI/Patient Profile: 76 y.o. female  with past medical history of left breast cancer s/p mastectomy in 1999, hypertension, and hyperlipidemia who presented to [facility] ED on 07/24/2021 with progressive functional decline (confusion, diminished ambulation).  She underwent a screening mammogram in May which was concerning for a right breast mass with calcifications and was recommended for biopsy but has not yet undergone this. In the ED, head CT showed 2 large brain masses with surrounding edema and regional mass effect.  MRI confirmed multiple intracranial masses, including 2 large lesions involving left frontal and right temporal lobes with severe surrounding vasogenic edema.  She was started on Decadron and neurosurgery was consulted  Past Medical History:  Diagnosis Date   Anemia    Arthritis    Breast cancer ([place])    Cancer ([place]) 1999   Breast Cancer   Hyperlipidemia    Hypertension    Osteopenia 01/12/2021    Subjective:   I have reviewed medical records including EPIC notes, labs and imaging, discussed case with Dr. Sabino Gasser, and assessed the patient at bedside.  She is alert and oriented to person and place.  During my visit, patient needs to toilet.  She is able to get to bedside commode with assist x2.  She is quite weak and needs much cueing for mobility.  I met today with her family members in the room to discuss diagnosis, prognosis, GOC, EOL wishes, disposition, and options.  Present are her sisters Estill Batten and Benjamine Mola, as well as her nephew Gwyndolyn Saxon and his wife Everlene Farrier.  I introduced Palliative Medicine as specialized medical care for people  living with serious illness. It focuses on providing relief from the symptoms and stress of a serious illness.   Created space and opportunity for family to explore thoughts and feelings regarding current medical situation. Values and goals of care important to patient and family were attempted to be elicited.  Questions and concerns addressed. Family encouraged to call with questions or concerns.     Life Review: Letecia has lived in [place] her entire life.  Prior to retirement, she worked in the [employer] for many years.  She has never been married and does not have children.  She is 1 of 10 siblings (7 are living) who mostly live nearby.  She enjoys shopping and playing slot machines.  She is a woman of faith and has been involved in the [employer] denomination.  Functional Status: Marthann lives at the family "[facility]" (the home where she and her siblings all grew up).  Her Sister Estill Batten has also lived there for the past several years.  Since May, Journii has experienced progressive functional decline.  Patient/Family Understanding of Illness: Family verbalizes understanding that patient has metastatic cancer, which is a noncurable condition.  They understand the intent of treatment would be palliative in nature.  Goals: To be determined.  Additional Discussion: We discussed patient's current illness and what it means in the larger context of her ongoing co-morbidities. I provided education on the natural disease trajectory of advanced  cancer, emphasizing that functional status is generally preserved until late in the disease course, followed by a precipitous decline over weeks to months.   The difference between full scope medical intervention and comfort care was considered.  I provided education on the concept of a comfort path to family, emphasizing that this path involves de-escalating and stopping full scope medical interventions, allowing a natural course to occur. Discussed  that the goal is comfort and dignity rather than cure/prolonging life. Introduced hospice philosophy and provided information on home versus residential hospice services.   Reviewed recommendations by neurosurgery.  Discussed that surgical intervention would be high risk and is not recommended.  At this time, family is continuing to discuss amongst themselves.  They are considering comfort care/hospice versus proceeding with right breast biopsy and possibly palliative radiation.  Family requests that Kaizlee herself be asked "what she wants".  I explained to [facility] her diagnosis and prognosis as well as options for care (palliative radiation versus comfort care).  She is not able to verbalize this back to me, however she appears to nod and understanding and becomes tearful.  She states "go on and let it do what is going to do".   I explained that Drina has altered mental status due to the brain lesions, and family would need to make the final decision.  They request time to process overnight and we plan for a follow-up meeting tomorrow morning.  They indicate they will make a final decision by then.  Discussion on code status will be deferred until tomorrow.   Review of Systems  Unable to perform ROS   Objective:   Primary Diagnoses: Present on Admission:  Mixed hyperlipidemia  Primary hypertension   Physical Exam Vitals reviewed.  Constitutional:      General: She is not in acute distress.    Appearance: She is ill-appearing.  Pulmonary:     Effort: Pulmonary effort is normal.  Neurological:     Mental Status: She is alert.     Motor: Weakness present.     Comments: Oriented to person and place  Psychiatric:        Mood and Affect: Affect is flat.     Vital Signs:  BP 134/73 (BP Location: Left Arm)   Pulse 73   Temp 98 F (36.7 C) (Oral)   Resp 17   Ht 5' 7.99" (1.727 m)   Wt 88.7 kg   SpO2 99%   BMI 29.74 kg/m   Palliative Assessment/Data: PPS 40%     Assessment &  Plan:   SUMMARY OF RECOMMENDATIONS   Continue current interventions Family considering comfort care versus proceeding with biopsy and possible palliative radiation Follow-up meeting planned for tomorrow 7/10  Primary Decision Maker: Family makes decisions together as a group  Code Status/Advance Care Planning: Full code for now; will discuss tomorrow  Prognosis:  Poor overall  Discharge Planning:  To Be Determined    Thank you for allowing [name] to participate in the care of TAMALYN WADSWORTH   Time Total: 90 minutes  Greater than 50%  of this time was spent counseling and coordinating care related to the above assessment and plan.  Signed by: Elie Confer, NP Palliative Medicine Team  Team Phone # [phone]  For individual providers, please see AMION

## 2021-07-25 NOTE — Evaluation (Signed)
Physical Therapy Evaluation Patient Details Name: Jennifer Cowan MRN: [id number] DOB: 1945-05-28 Today's Date: 07/25/2021  History of Present Illness  Jennifer Cowan is a 76 y.o. female  who presented with progressive functional decline (confusion, diminished ambulation); Imaging showing brain masses and a breast mass and lung mass; with medical history significant for HTN, HLD, left breast cancer s/p mastectomy in 1999  Clinical Impression   Pt admitted with above diagnosis. Lives at home with her sister, in a single-level home with one step to enter; Prior to admission, pt was independent until recent weeks when difficulty walking, and AMS lead to this admission; ; Presents to PT with mildly unsteady gait, incr fall risk, slow processing;  Light mod assist and mutlimodal cueing to initiate getting up; min assist to stand, and min handheld assist to walk short distance; Pt currently with functional limitations due to the deficits listed below (see PT Problem List). Pt will benefit from skilled PT to increase their independence and safety with mobility to allow discharge to the venue listed below.      Will benefit from HHPT/OT course for maximizing independence and safety with mobility and ADLs, as well as education for pt and sister; Also will defer to Palliative and Hospice services; will pt's sister be able to get respite care services?     Recommendations for follow up therapy are one component of a multi-disciplinary discharge planning process, led by the attending physician.  Recommendations may be updated based on patient status, additional functional criteria and insurance authorization.  Follow Up Recommendations Home health PT      Assistance Recommended at Discharge Set up Supervision/Assistance  Patient can return home with the following  Assistance with cooking/housework;Help with stairs or ramp for entrance;Assist for transportation    Equipment Recommendations Rolling walker (2  wheels);Wheelchair (measurements PT);Wheelchair cushion (measurements PT);BSC/3in1;Other (comment) (Can wait on wheelchair, but anticipate she will eventually need one)  Recommendations for Other Services       Functional Status Assessment Patient has had a recent decline in their functional status and demonstrates the ability to make significant improvements in function in a reasonable and predictable amount of time.     Precautions / Restrictions Precautions Precautions: Fall      Mobility  Bed Mobility Overal bed mobility: Needs Assistance Bed Mobility: Supine to Sit     Supine to sit: Mod assist     General bed mobility comments: light mod assist and multimodal cues to initiate and follow through; light mod assist and use of bed pad to square off hips at EOB    Transfers Overall transfer level: Needs assistance Equipment used: 1 person hand held assist Transfers: Sit to/from Stand Sit to Stand: Min assist           General transfer comment: min assist to steady at initial stand    Ambulation/Gait Ambulation/Gait assistance: Min assist Gait Distance (Feet): 20 Feet Assistive device: 1 person hand held assist Gait Pattern/deviations: Step-through pattern Gait velocity: slow     General Gait Details: Mildly unsteady, slow cadence  Stairs            Wheelchair Mobility    Modified Rankin (Stroke Patients Only)       Balance Overall balance assessment: Needs assistance Sitting-balance support: Feet supported Sitting balance-Leahy Scale: Fair       Standing balance-Leahy Scale: Poor  Pertinent Vitals/Pain Pain Assessment Pain Assessment: No/denies pain    Home Living Family/patient expects to be discharged to:: Private residence Living Arrangements: Other relatives ([name]) Available Help at Discharge: Family;Available 24 hours/day (if not 24 hours, very nearly 24 hours) Type of Home:  House Home Access: Stairs to enter   [employer] of Steps: 1   Home Layout: One level Home Equipment: None      Prior Function Prior Level of Function : Independent/Modified Independent             Mobility Comments: Functional decline recently leading to this admission; pt and sister deny any falls ADLs Comments: Needing sister's help more recently     Hand Dominance        Extremity/Trunk Assessment   Upper Extremity Assessment Upper Extremity Assessment: Generalized weakness    Lower Extremity Assessment Lower Extremity Assessment: Generalized weakness (Difficulty with MMT due to decr cognition/decr motor planning)       Communication   Communication: Other (comment) (Slow to answer questions at times)  Cognition Arousal/Alertness: Awake/alert Behavior During Therapy: Flat affect Overall Cognitive Status: Impaired/Different from baseline Area of Impairment: Problem solving                             Problem Solving: Slow processing, Decreased initiation, Difficulty sequencing, Requires tactile cues General Comments: Slow to anser questions        General Comments General comments (skin integrity, edema, etc.): Sister, Clara, present and helpful    Exercises     Assessment/Plan    PT Assessment Patient needs continued PT services  PT Problem List Decreased strength;Decreased activity tolerance;Decreased balance;Decreased mobility;Decreased coordination;Decreased cognition;Decreased knowledge of use of DME;Decreased safety awareness;Decreased knowledge of precautions       PT Treatment Interventions DME instruction;Gait training;Stair training;Functional mobility training;Therapeutic activities;Therapeutic exercise;Balance training;Neuromuscular re-education;Cognitive remediation;Patient/family education    PT Goals (Current goals can be found in the Care Plan section)  Acute Rehab PT Goals Patient Stated Goal: did not  sepcifically state PT Goal Formulation: With patient/family Time For Goal Achievement: 08/08/21 Potential to Achieve Goals: Good    Frequency Min 3X/week     Co-evaluation               AM-PAC PT "6 Clicks" Mobility  Outcome Measure Help needed turning from your back to your side while in a flat bed without using bedrails?: None Help needed moving from lying on your back to sitting on the side of a flat bed without using bedrails?: A Lot Help needed moving to and from a bed to a chair (including a wheelchair)?: A Little Help needed standing up from a chair using your arms (e.g., wheelchair or bedside chair)?: A Little Help needed to walk in hospital room?: A Little Help needed climbing 3-5 steps with a railing? : A Lot 6 Click Score: 17    End of Session Equipment Utilized During Treatment: Gait belt Activity Tolerance: Patient tolerated treatment well Patient left: in chair;with call bell/phone within reach;with chair alarm set Nurse Communication: Mobility status (and requsted nursing staff check chair alarm function) PT Visit Diagnosis: Unsteadiness on feet (R26.81)    Time: 1536-1601 PT Time Calculation (min) (ACUTE ONLY): 25 min   Charges:   PT Evaluation $PT Eval Moderate Complexity: 1 Mod PT Treatments $Gait Training: 8-22 mins        Roney Marion, PT  Acute Rehabilitation Services Office [place]  07/25/2021, 5:50 PM

## 2021-07-25 NOTE — Plan of Care (Signed)
  Problem: Nutrition: Goal: Adequate nutrition will be maintained Outcome: Progressing   Problem: Pain Managment: Goal: General experience of comfort will improve Outcome: Progressing   Problem: Safety: Goal: Ability to remain free from injury will improve Outcome: Progressing   

## 2021-07-25 NOTE — Assessment & Plan Note (Addendum)
-   Noted initially on outpatient mammogram in May 2023.  Patient has not yet undergone biopsy prior to this time - [place] is her clinical presentation most consistent with primary breast cancer now with mets to the lung and brain (versus a primary lung lesion too) - Oncology aware - successful R breast biopsy on 7/10 with Dr. Pete Glatter; follow up biopsy results which should take ~ 24 hours

## 2021-07-25 NOTE — Consult Note (Signed)
Reason for Consult: Brain tumors Referring Physician: Medicine  Jennifer Cowan is an 76 y.o. female.  HPI: 76 year old woman with remote history of breast carcinoma presents with declining mental status and aphasia.  Work-up is demonstrated evidence of a large posterior frontal medial dural based mass and a large right temporal mass consistent with metastatic disease and most consistent with breast carcinoma which has metastasized to the brain.  CT scan of her abdomen chest and pelvis demonstrate an necrotic breast lesion.  Past Medical History:  Diagnosis Date   Anemia    Arthritis    Breast cancer ([place])    Cancer ([place]) 1999   Breast Cancer   Hyperlipidemia    Hypertension    Osteopenia 01/12/2021    Past Surgical History:  Procedure Laterality Date   BREAST SURGERY  1999   Masectomy- left   CYSTECTOMY     cyst removed off top of head   MASTECTOMY      Family History  Problem Relation Age of Onset   Arthritis Mother    Breast cancer Sister    Cancer Sister        Breast Cancer   Diabetes Sister    Hyperlipidemia Sister    Hypertension Sister    Stroke Sister    Diabetes Brother    Hypertension Brother     Social History:  reports that she has never smoked. She has never used smokeless tobacco. She reports that she does not drink alcohol and does not use drugs.  Allergies: No Known Allergies  Medications: I have reviewed the patient's current medications.  Results for orders placed or performed during the hospital encounter of 07/24/21 (from the past 48 hour(s))  Comprehensive metabolic panel     Status: Abnormal   Collection Time: 07/24/21 12:42 PM  Result Value Ref Range   Sodium 139 135 - 145 mmol/L   Potassium 3.6 3.5 - 5.1 mmol/L   Chloride 108 98 - 111 mmol/L   CO2 24 22 - 32 mmol/L   Glucose, Bld 115 (H) 70 - 99 mg/dL    Comment: Glucose reference range applies only to samples taken after fasting for at least 8 hours.   BUN 9 8 - 23 mg/dL   Creatinine,  Ser 0.78 0.44 - 1.00 mg/dL   Calcium 9.4 8.9 - 10.3 mg/dL   Total Protein 7.0 6.5 - 8.1 g/dL   Albumin 3.6 3.5 - 5.0 g/dL   AST 19 15 - 41 U/L   ALT 18 0 - 44 U/L   Alkaline Phosphatase 71 38 - 126 U/L   Total Bilirubin 0.6 0.3 - 1.2 mg/dL   GFR, Estimated >60 >60 mL/min    Comment: (NOTE) Calculated using the CKD-EPI Creatinine Equation (2021)    Anion gap 7 5 - 15    Comment: Performed at [facility] [address] [place], [place] [address]  CBC with Differential/Platelet     Status: Abnormal   Collection Time: 07/24/21 12:42 PM  Result Value Ref Range   WBC 5.3 4.0 - 10.5 K/uL   RBC 5.00 3.87 - 5.11 MIL/uL   Hemoglobin 14.5 12.0 - 15.0 g/dL   HCT 45.0 36.0 - 46.0 %   MCV 90.0 80.0 - 100.0 fL   MCH 29.0 26.0 - 34.0 pg   MCHC 32.2 30.0 - 36.0 g/dL   RDW 13.3 11.5 - 15.5 %   Platelets 450 (H) 150 - 400 K/uL   nRBC 0.0 0.0 - 0.2 %  Neutrophils Relative % 67 %   Neutro Abs 3.6 1.7 - 7.7 K/uL   Lymphocytes Relative 22 %   Lymphs Abs 1.2 0.7 - 4.0 K/uL   Monocytes Relative 9 %   Monocytes Absolute 0.5 0.1 - 1.0 K/uL   Eosinophils Relative 1 %   Eosinophils Absolute 0.1 0.0 - 0.5 K/uL   Basophils Relative 1 %   Basophils Absolute 0.0 0.0 - 0.1 K/uL   Immature Granulocytes 0 %   Abs Immature Granulocytes 0.02 0.00 - 0.07 K/uL    Comment: Performed at [facility] [address], [place], [place] [address]  Ammonia     Status: Abnormal   Collection Time: 07/24/21 12:43 PM  Result Value Ref Range   Ammonia 36 (H) 9 - 35 umol/L    Comment: Performed at [facility], [place] [address], [place], [place] [address]  Lactic acid, plasma     Status: None   Collection Time: 07/24/21 12:43 PM  Result Value Ref Range   Lactic Acid, Venous 1.5 0.5 - 1.9 mmol/L    Comment: Performed at [facility] [address], [place], [place] [address]  Ethanol     Status: None   Collection Time: 07/24/21 12:43 PM  Result Value Ref Range   Alcohol, Ethyl (B) <10 <10  mg/dL    Comment: (NOTE) Lowest detectable limit for serum alcohol is 10 mg/dL.  For medical purposes only. Performed at [facility], [place] [address], [place], [place] [address]   Urinalysis, Routine w reflex microscopic Urine, Clean Catch     Status: Abnormal   Collection Time: 07/24/21  7:06 PM  Result Value Ref Range   Color, Urine YELLOW YELLOW   APPearance CLEAR CLEAR   Specific Gravity, Urine 1.014 1.005 - 1.030   pH 5.0 5.0 - 8.0   Glucose, UA NEGATIVE NEGATIVE mg/dL   Hgb urine dipstick NEGATIVE NEGATIVE   Bilirubin Urine NEGATIVE NEGATIVE   Ketones, ur 20 (A) NEGATIVE mg/dL   Protein, ur NEGATIVE NEGATIVE mg/dL   Nitrite NEGATIVE NEGATIVE   Leukocytes,Ua TRACE (A) NEGATIVE   RBC / HPF 0-5 0 - 5 RBC/hpf   WBC, UA 6-10 0 - 5 WBC/hpf   Bacteria, UA NONE SEEN NONE SEEN   Squamous Epithelial / LPF 0-5 0 - 5   Mucus PRESENT     Comment: Performed at [facility], [place] [address], [place], [place] [address]  Rapid urine drug screen (hospital performed)     Status: None   Collection Time: 07/24/21  7:06 PM  Result Value Ref Range   Opiates NONE DETECTED NONE DETECTED   Cocaine NONE DETECTED NONE DETECTED   Benzodiazepines NONE DETECTED NONE DETECTED   Amphetamines NONE DETECTED NONE DETECTED   Tetrahydrocannabinol NONE DETECTED NONE DETECTED   Barbiturates NONE DETECTED NONE DETECTED    Comment: (NOTE) DRUG SCREEN FOR MEDICAL PURPOSES ONLY.  IF CONFIRMATION IS NEEDED FOR ANY PURPOSE, NOTIFY LAB WITHIN 5 DAYS.  LOWEST DETECTABLE LIMITS FOR URINE DRUG SCREEN Drug Class                     Cutoff (ng/mL) Amphetamine and metabolites    1000 Barbiturate and metabolites    200 Benzodiazepine                 737 Tricyclics and metabolites     300 Opiates and metabolites        300 Cocaine  and metabolites        300 THC                            50 Performed at [facility], [place] [address] [place], [place] [address]   CBC     Status: None    Collection Time: 07/25/21  1:11 AM  Result Value Ref Range   WBC 6.7 4.0 - 10.5 K/uL   RBC 5.01 3.87 - 5.11 MIL/uL   Hemoglobin 14.6 12.0 - 15.0 g/dL   HCT 44.6 36.0 - 46.0 %   MCV 89.0 80.0 - 100.0 fL   MCH 29.1 26.0 - 34.0 pg   MCHC 32.7 30.0 - 36.0 g/dL   RDW 13.5 11.5 - 15.5 %   Platelets 400 150 - 400 K/uL   nRBC 0.0 0.0 - 0.2 %    Comment: Performed at [facility], [place] [address], [place], [place] [address]  Basic metabolic panel     Status: Abnormal   Collection Time: 07/25/21  1:11 AM  Result Value Ref Range   Sodium 140 135 - 145 mmol/L   Potassium 3.5 3.5 - 5.1 mmol/L   Chloride 104 98 - 111 mmol/L   CO2 22 22 - 32 mmol/L   Glucose, Bld 154 (H) 70 - 99 mg/dL    Comment: Glucose reference range applies only to samples taken after fasting for at least 8 hours.   BUN 9 8 - 23 mg/dL   Creatinine, Ser 0.66 0.44 - 1.00 mg/dL   Calcium 9.7 8.9 - 10.3 mg/dL   GFR, Estimated >60 >60 mL/min    Comment: (NOTE) Calculated using the CKD-EPI Creatinine Equation (2021)    Anion gap 14 5 - 15    Comment: Performed at [facility] [address], [place], [place] [address]    MR BRAIN W WO CONTRAST  Result Date: 07/24/2021 CLINICAL DATA:  Brain metastases EXAM: MRI HEAD WITHOUT AND WITH CONTRAST TECHNIQUE: Multiplanar, multiecho pulse sequences of the brain and surrounding structures were obtained without and with intravenous contrast. CONTRAST:  8.6mL GADAVIST GADOBUTROL 1 MMOL/ML IV SOLN COMPARISON:  None Available. FINDINGS: Brain: There is a large, partially necrotic intraparenchymal mass in the right temporal lobe measuring 4.4 x 3.7 cm. There is severe surrounding hyperintense T2-weighted signal. There is a heterogeneous intraparenchymal mass within the paramedian left frontal lobe measuring 3.0 x 2.6 cm, also a large amount of surrounding edema. There are 5 mm enhancing nodules in the anterior right temporal lobe and in the left temporal lobe. There is bilateral mass  effect without herniation. No hydrocephalus. There is petechial hemorrhage within both masses. Vascular: Major flow voids are preserved. Skull and upper cervical spine: Normal calvarium and skull base. Visualized upper cervical spine and soft tissues are normal. Sinuses/Orbits:No paranasal sinus fluid levels or advanced mucosal thickening. No mastoid or middle ear effusion. Normal orbits. IMPRESSION: 1. Multiple intracranial metastases, including 2 large lesions of the left frontal and right temporal lobes with severe surrounding edema. 2. Bilateral mass effect without herniation. Electronically Signed   By: Ulyses Jarred M.D.   On: 07/24/2021 21:19   CT CHEST ABDOMEN PELVIS W CONTRAST  Result Date: 07/24/2021 CLINICAL DATA:  Brain metastases, unknown primary EXAM: CT CHEST, ABDOMEN, AND PELVIS WITH CONTRAST TECHNIQUE: Multidetector CT imaging of the chest, abdomen and pelvis was performed following the standard protocol during bolus administration of intravenous contrast. RADIATION  DOSE REDUCTION: This exam was performed according to the departmental dose-optimization program which includes automated exposure control, adjustment of the mA and/or kV according to patient size and/or use of iterative reconstruction technique. CONTRAST:  24mL OMNIPAQUE IOHEXOL 300 MG/ML  SOLN COMPARISON:  None Available. FINDINGS: CT CHEST FINDINGS Cardiovascular: Heart is normal size. Scattered coronary artery and aortic calcifications. Aorta normal caliber. Mediastinum/Nodes: Pre-vascular and left AP window adenopathy noted which appears low-density/necrotic. These nodes measure up to 1.8 cm in short axis diameter. Left hilar adenopathy with a short axis diameter of 11 mm. No axillary adenopathy. Lungs/Pleura: Left upper lobe mass noted measuring up to 3.6 cm on image 50 of series 4. This is concerning for lung primary. No additional lung nodules or masses. No effusions. Musculoskeletal: Necrotic appearing lobulated mass in the  right breast measures 6 x 5 cm concerning for breast cancer. Prior left mastectomy. No acute bony abnormality. CT ABDOMEN PELVIS FINDINGS Hepatobiliary: Low-density lesions within the liver, favor cysts. The largest measures up to 4.9 cm. Gallbladder unremarkable. Pancreas: No focal abnormality or ductal dilatation. Spleen: No focal abnormality.  Normal size. Adrenals/Urinary Tract: Contrast material seen within the renal collecting systems bilaterally from earlier imaging. Probable nonobstructing stones in the upper pole of the left kidney although this could represent contrast material rather than stones. Overlying scarring and cortical thinning. No hydronephrosis. Adrenal glands and urinary bladder unremarkable. Stomach/Bowel: Normal appendix. Stomach, large and small bowel grossly unremarkable. Vascular/Lymphatic: Aortic atherosclerosis. No evidence of aneurysm or adenopathy. Reproductive: Multiple lesions within the uterus most compatible with fibroids measuring up to 5.4 cm. No adnexal masses. Other: No free fluid or free air. Musculoskeletal: No acute bony abnormality. IMPRESSION: 6 x 5 cm necrotic appearing mass within the right breast concerning for breast cancer. 3.6 cm necrotic appearing mass in the left upper lobe. This could reflect primary lung cancer or metastasis. Prevascular, AP window and left hilar adenopathy. Low-density lesions in the liver appear to be simple cysts. Aortic atherosclerosis. Fibroid uterus. No acute findings in the abdomen or pelvis. Electronically Signed   By: Rolm Baptise M.D.   On: 07/24/2021 21:12   CT HEAD WO CONTRAST  Result Date: 07/24/2021 CLINICAL DATA:  Delirium EXAM: CT HEAD WITHOUT CONTRAST TECHNIQUE: Contiguous axial images were obtained from the base of the skull through the vertex without intravenous contrast. RADIATION DOSE REDUCTION: This exam was performed according to the departmental dose-optimization program which includes automated exposure control,  adjustment of the mA and/or kV according to patient size and/or use of iterative reconstruction technique. COMPARISON:  None Available. FINDINGS: Brain: Approximately 2.5 cm mass lesion in the anterior left frontal lobe (series 3, image 17). Additional probable cystic/necrotic large mass lesion centered in the right parietal and temporal lobes. Extensive edema surrounding both lesions with associated regional mass effect. No definite evidence of acute hemorrhage. There is effacement of the ventricles due to mass effect from the above lesions. No hydrocephalus. No evidence of acute large vascular territory infarct. Vascular: Calcific intracranial atherosclerosis. Skull: No acute fracture. Sinuses/Orbits: Clear visualized sinuses. Other: No mastoid effusions. IMPRESSION: At least 2 large mass lesions in the left frontal and right parietotemporal regions, concerning for metastatic disease but incompletely assessed on this study. Extensive surrounding edema with regional mass effect. Recommend MRI with contrast for confirmation and further evaluation. Electronically Signed   By: Margaretha Sheffield M.D.   On: 07/24/2021 13:58    Review of systems not obtained due to patient factors. Blood pressure 134/73, pulse 73,  temperature 98 F (36.7 C), temperature source Oral, resp. rate 17, height 5' 7.99" (1.727 m), weight 88.7 kg, SpO2 99 %. Patient is awake.  She is essentially nonverbal.  She follows commands bilaterally.  Her strength is equal.  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  Given the appearance of the brain lesions both in size and location overall I think her likelihood of significantly benefiting from surgical resection of these lesions is quite low.  I would recommend palliative radiation treatment and hospice care.  Jennifer Cowan 07/25/2021, 12:24 PM

## 2021-07-25 NOTE — Plan of Care (Signed)
Vitals stable, bed alarm on. Confused and need a lot of cues but follow commands.  Problem: Health Behavior/Discharge Planning: Goal: Ability to manage health-related needs will improve Outcome: Progressing   Problem: Safety: Goal: Ability to remain free from injury will improve Outcome: Progressing

## 2021-07-26 ENCOUNTER — Encounter (HOSPITAL_COMMUNITY): Payer: Self-pay | Admitting: Internal Medicine

## 2021-07-26 ENCOUNTER — Ambulatory Visit
Admit: 2021-07-26 | Discharge: 2021-07-26 | Disposition: A | Discharge status: Home / Self Care | Payer: Medicare Other | Source: Ambulatory Visit | Attending: Radiation Oncology | Admitting: Radiation Oncology

## 2021-07-26 ENCOUNTER — Inpatient Hospital Stay (HOSPITAL_COMMUNITY): Payer: Medicare Other

## 2021-07-26 DIAGNOSIS — G9389 Other specified disorders of brain: Secondary | ICD-10-CM

## 2021-07-26 DIAGNOSIS — R918 Other nonspecific abnormal finding of lung field: Secondary | ICD-10-CM

## 2021-07-26 DIAGNOSIS — Z515 Encounter for palliative care: Secondary | ICD-10-CM | POA: Diagnosis not present

## 2021-07-26 DIAGNOSIS — R4182 Altered mental status, unspecified: Secondary | ICD-10-CM | POA: Diagnosis not present

## 2021-07-26 DIAGNOSIS — N6310 Unspecified lump in the right breast, unspecified quadrant: Secondary | ICD-10-CM | POA: Diagnosis not present

## 2021-07-26 MED ORDER — LIDOCAINE HCL (PF) 1 % IJ SOLN
INTRAMUSCULAR | Status: AC
  Filled 2021-07-26: qty 30

## 2021-07-26 NOTE — Progress Notes (Signed)
Progress Note    Jennifer Cowan   IRS:[id number]  DOB: 04-Apr-1945  DOA: 07/24/2021     2 PCP: [name], FNP  Initial CC: AMS, weakness  Hospital Course: Jennifer Cowan is a 76 y.o. female with medical history significant for HTN, HLD, left breast cancer s/p mastectomy in 1999 who presented with progressive functional decline (confusion, diminished ambulation). She underwent mammogram in May which was concerning for a right breast mass with calcifications.  She was recommended for biopsy but has not yet undergone prior to admission. She underwent work-up regarding her confusion.  Initial CT head showed 2 large brain lesions/masses with surrounding edema and regional mass effect.  She then underwent MRI brain for further evaluation.  This confirmed multiple intracranial masses, including 2 large lesions involving the left frontal and right temporal lobes with severe surrounding vasogenic edema.  Bilateral mass effect noted without herniation.  She was started on Decadron and neurosurgery was consulted. Extensive goals of care discussions were held the following morning with her family.  They are still discussing amongst themselves but they are amenable for at least consideration of brain radiation at this time. Palliative care also consulted along with medical oncology and radiation oncology.  IR consulted for discussion regarding right breast mass biopsy. She was able to undergo R breast biopsy on 07/26/21.   Interval History:  No events overnight.  One of her sisters is present this morning and we also called Mardene Celeste on the phone and spoke with her while I was in the room. She was able to undergo right breast biopsy today and hopefully we should have results back by tomorrow. Her mentation is slightly improved which is presumed due to steroid use. Family still discussing decisions regarding pursuing radiation or any other treatment at this time.  Palliative care also continuing to follow  and discuss with family.  Assessment and Plan: * Brain masses - MRI brain confirms multiple intracranial masses.  The largest to involve the left frontal and right temporal lobes (right temporal largest).  There is surrounding vasogenic edema and bilateral mass effect, no herniation - Main differential at this time includes primary right breast cancer with metastasis to the left upper lung and brain mets (possible for still 2 primaries notably the breast and LUL lung mass) - Evaluated by neurosurgery, greatly appreciate assistance.  Surgical intervention would be high risk and is not recommended nor lead to good quality outcomes given clinical presentation - Family discussing in depth amongst themselves as patient is not able to largely interact or show ability to make informed decisions; currently they are considering brain radiation but still also possibility of pure comfort care/hospice -Palliative care following for [place] discussions, greatly appreciate assistance -Dr. Lisbeth Renshaw with radiation oncology aware of patient.  Family still discussing decision regarding pursuing radiation versus possibly just hospice -Continue Decadron for now and focusing mostly on patient comfort if she does become more symptomatic; family seems to understand poor prognosis overall and that aggressive care not likely to make a big difference  Breast mass, right - Noted initially on outpatient mammogram in May 2023.  Patient has not yet undergone biopsy prior to this time - [place] is her clinical presentation most consistent with primary breast cancer now with mets to the lung and brain (versus a primary lung lesion too) - Oncology aware - successful R breast biopsy on 7/10 with Dr. Dimas Aguas; follow up biopsy results which should take ~ 24 hours  Mass of upper lobe  of left lung - CT shows 3.6 cm necrotic mass involving left upper lobe.  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    Old records reviewed in assessment of this patient  Antimicrobials:   DVT prophylaxis:  enoxaparin (LOVENOX) injection 40 mg Start: 07/24/21 2000   Code Status:   Code Status: Full Code  Mobility Assessment (last 72 hours)     Mobility Assessment     Row Name 07/26/21 1200 07/26/21 0752 07/25/21 1928 07/25/21 1700 07/25/21 1023   Does patient have an order for bedrest or is patient medically unstable -- No - Continue assessment No - Continue assessment -- No - Continue assessment   What is the highest level of mobility based on the progressive mobility assessment? Level 4 (Walks with assist in room) - Balance while marching in place and cannot step forward and back - Complete Level 4 (Walks with assist in room) - Balance while marching in place and cannot step forward and back - Complete Level 4 (Walks with assist in room) - Balance while marching in place and cannot step forward and back - Complete Level 4 (Walks with assist in room) - Balance while marching in place and cannot step forward and back - Complete Level 4 (Walks with assist in room) - Balance while marching in place and cannot step forward and back - Complete    Row Name 07/24/21 2236           Does patient have an order for bedrest or is patient medically unstable No - Continue assessment       What is the highest level of mobility based on the progressive mobility assessment? Level 4 (Walks with assist in room) - Balance while marching in place and cannot step forward and back - Complete                Disposition Plan: Pending further work-up and family decisions Status is: Inpatient  Objective: Blood pressure 131/74, pulse 88, temperature  98.2 F (36.8 C), temperature source Oral, resp. rate 17, height 5' 7.99" (1.727 m), weight 88.7 kg, SpO2 97 %.  Examination:  Physical Exam Constitutional:      Comments: Chronically ill-appearing elderly woman lying in bed in no distress. Slightly less confused and can answer more questions with short answers  HENT:     Head: Normocephalic and atraumatic.     Mouth/Throat:     Mouth: Mucous membranes are moist.  Eyes:     Extraocular Movements: Extraocular movements intact.  Cardiovascular:     Rate and Rhythm: Normal rate and regular rhythm.  Pulmonary:     Effort: Pulmonary effort is normal.     Breath sounds: Normal breath sounds.  Abdominal:     General: Bowel sounds are normal. There is no distension.     Palpations: Abdomen is soft.     Tenderness: There is no abdominal tenderness.  Musculoskeletal:        General: No swelling.     Cervical back: Normal range of motion and neck supple.  Skin:    General: Skin is warm and dry.  Neurological:     Mental Status: She is disoriented.     Comments: Follows commands and  moves all 4 extremities      Consultants:  Neurosurgery Palliative care medicine Medical oncology Radiation oncology Interventional radiology  Procedures:  Right breast biopsy, 07/26/21  Data Reviewed: No results found for this or any previous visit (from the past 24 hour(s)).   I have Reviewed nursing notes, Vitals, and Lab results since pt's last encounter. Pertinent lab results : see above I have ordered test including BMP, CBC, Mg I have reviewed the last note from staff over past 24 hours I have discussed pt's care plan and test results with nursing staff, case manager   LOS: 2 days   Dwyane Dee, MD [facility] 07/26/2021, 5:31 PM

## 2021-07-26 NOTE — Evaluation (Signed)
Occupational Therapy Evaluation Patient Details Name: Jennifer Cowan MRN: [id number] DOB: Apr 03, 1945 Today's Date: 07/26/2021   History of Present Illness Jennifer Cowan is a 76 y.o. female  who presented with progressive functional decline (confusion, diminished ambulation); Imaging showing brain masses and a breast mass and lung mass; with medical history significant for HTN, HLD, left breast cancer s/p mastectomy in 1999   Clinical Impression   PTA, pt lives with sister, typically Independent with ADLs/mobility without AD and shares IADLs with sister. Pt presents now with deficits in cognition, strength and standing balance. Pt benefits from simple, one step directions and when allowed time to respond, does fairly well. Pt requires Supervision for UB ADL and up to Mod A for LB ADLs. Trialed with handheld assist to bathroom at [place] with improvements to min guard when RW used. Educated pt/sister re: slow direction during tasks, allowing pt to participate in all ADLs to maintain independence and assist for IADL tasks such as med mgmt, cooking d/t cognitive deficits. Noted continued [place] discussions - would currently recommend HHOT follow-up to maximize safety/independence in the home.       Recommendations for follow up therapy are one component of a multi-disciplinary discharge planning process, led by the attending physician.  Recommendations may be updated based on patient status, additional functional criteria and insurance authorization.   Follow Up Recommendations  Home health OT    Assistance Recommended at Discharge Frequent or constant Supervision/Assistance  Patient can return home with the following A little help with walking and/or transfers;A little help with bathing/dressing/bathroom;Assistance with cooking/housework;Direct supervision/assist for medications management;Direct supervision/assist for financial management;Assist for transportation;Help with stairs or ramp for entrance     Functional Status Assessment  Patient has had a recent decline in their functional status and demonstrates the ability to make significant improvements in function in a reasonable and predictable amount of time.  Equipment Recommendations  BSC/3in1;Other (comment) (RW)    Recommendations for Other Services       Precautions / Restrictions Precautions Precautions: Fall Restrictions Weight Bearing Restrictions: No      Mobility Bed Mobility Overal bed mobility: Needs Assistance Bed Mobility: Supine to Sit     Supine to sit: Min guard, HOB elevated          Transfers Overall transfer level: Needs assistance Equipment used: 1 person hand held assist, Rolling walker (2 wheels) Transfers: Sit to/from Stand Sit to Stand: Min assist           General transfer comment: Min guard from bed with handheld assist. Min A from toilet with grab bars d/t lower surface height      Balance Overall balance assessment: Needs assistance Sitting-balance support: Feet supported Sitting balance-Leahy Scale: Good     Standing balance support: No upper extremity supported, During functional activity Standing balance-Leahy Scale: Poor Standing balance comment: reliant on UE support in standing                           ADL either performed or assessed with clinical judgement   ADL Overall ADL's : Needs assistance/impaired Eating/Feeding: Independent   Grooming: Supervision/safety;Standing   Upper Body Bathing: Supervision/ safety;Sitting   Lower Body Bathing: Moderate assistance;Sitting/lateral leans;Sit to/from stand   Upper Body Dressing : Supervision/safety;Sitting   Lower Body Dressing: Moderate assistance;Sit to/from stand;Sitting/lateral leans   Toilet Transfer: Min guard;Ambulation;Minimal assistance Toilet Transfer Details (indicate cue type and reason): Min A and handheld assist initially d/t  no AD typicallly used. trialed RW with bathroom exit and improved  stability noted. min A to stand from reg toilet with use of grab bars Toileting- Clothing Manipulation and Hygiene: Moderate assistance;Sitting/lateral lean;Sit to/from stand Toileting - Clothing Manipulation Details (indicate cue type and reason): sister assisting with clothing mgmt, OT assisted with toileting hygiene as pt typically does this in standing and did not feel confident attempting today     Functional mobility during ADLs: Min guard General ADL Comments: Limited by cognitive deficits 2/2 brain mets. educated sister/pt on use of simple, one step commands and allowing time for pt to participate to maximize independence with both agreeable. eductaed pt/sister on assist for meds, cooking at home d/t cognitive impairments     Vision Baseline Vision/History: 1 Wears glasses Ability to See in Adequate Light: 0 Adequate Patient Visual Report: No change from baseline Vision Assessment?: No apparent visual deficits     Perception     Praxis      Pertinent Vitals/Pain Pain Assessment Pain Assessment: Faces Faces Pain Scale: Hurts a little bit Pain Location: head Pain Descriptors / Indicators: Headache Pain Intervention(s): Monitored during session, Patient requesting pain meds-RN notified     Hand Dominance Right   Extremity/Trunk Assessment Upper Extremity Assessment Upper Extremity Assessment: Generalized weakness   Lower Extremity Assessment Lower Extremity Assessment: Defer to PT evaluation   Cervical / Trunk Assessment Cervical / Trunk Assessment: Normal   Communication Communication Communication: No difficulties;Other (comment) (slow to answer at times)   Cognition Arousal/Alertness: Awake/alert Behavior During Therapy: Flat affect Overall Cognitive Status: Impaired/Different from baseline Area of Impairment: Problem solving, Memory, Following commands, Awareness                     Memory: Decreased short-term memory Following Commands: Follows one  step commands with increased time   Awareness: Emergent Problem Solving: Slow processing, Decreased initiation, Difficulty sequencing, Requires tactile cues General Comments: slow to answer questions, implement cues but consistent. pleasant, answers PLOF questions relatively accurately     General Comments  Sister, Clara, present, hands on to assist pt    Exercises     Shoulder Instructions      Home Living Family/patient expects to be discharged to:: Private residence Living Arrangements: Other relatives ([name]) Available Help at Discharge: Family;Available 24 hours/day Type of Home: House Home Access: Stairs to enter [employer] of Steps: 1   Home Layout: One level     Bathroom Shower/Tub: Tub/shower unit;Sponge bathes at baseline   [employer]: Standard Bathroom Accessibility: Yes How Accessible: Accessible via walker Home Equipment: None          Prior Functioning/Environment Prior Level of Function : Independent/Modified Independent;Needs assist       Physical Assist : ADLs (physical)   ADLs (physical): IADLs Mobility Comments: Functional decline recently leading to this admission; pt and sister deny any falls. typically does not need AD for mobility ADLs Comments: Typically independent with ADLs, cooks some (though sister does most of stove cooking), does not drive - assist for transportation but enjoys getting out shopping and "sightseeing"        OT Problem List: Decreased strength;Decreased activity tolerance;Impaired balance (sitting and/or standing);Decreased cognition;Decreased safety awareness;Decreased knowledge of use of DME or AE      OT Treatment/Interventions: Self-care/ADL training;Therapeutic exercise;DME and/or AE instruction;Energy conservation;Therapeutic activities;Patient/family education    OT Goals(Current goals can be found in the care plan section) Acute Rehab OT Goals Patient Stated Goal: for headache  to go  away OT Goal Formulation: With patient/family Time For Goal Achievement: 08/09/21 Potential to Achieve Goals: Good  OT Frequency: Min 2X/week    Co-evaluation              AM-PAC OT "6 Clicks" Daily Activity     Outcome Measure Help from another person eating meals?: None Help from another person taking care of personal grooming?: A Little Help from another person toileting, which includes using toliet, bedpan, or urinal?: A Lot Help from another person bathing (including washing, rinsing, drying)?: A Lot Help from another person to put on and taking off regular upper body clothing?: A Little Help from another person to put on and taking off regular lower body clothing?: A Lot 6 Click Score: 16   End of Session Equipment Utilized During Treatment: Gait belt;Rolling walker (2 wheels) Nurse Communication: Patient requests pain meds  Activity Tolerance: Patient tolerated treatment well Patient left: in bed;with call bell/phone within reach;with bed alarm set;with family/visitor present  OT Visit Diagnosis: Unsteadiness on feet (R26.81);Other symptoms and signs involving cognitive function                Time: 1115-1141 OT Time Calculation (min): 26 min Charges:  OT General Charges $OT Visit: 1 Visit OT Evaluation $OT Eval Moderate Complexity: 1 Mod OT Treatments $Self Care/Home Management : 8-22 mins  Malachy Chamber, OTR/L Acute Rehab Services Office: [phone]   Layla Maw 07/26/2021, 12:56 PM

## 2021-07-26 NOTE — Consult Note (Addendum)
Jennifer Cowan  Telephone:([phone] Fax:([phone]) [phone]   MEDICAL ONCOLOGY - INITIAL CONSULTATION  Referral MD: Dr. Dwyane Dee  Reason for Referral: Brain lesions, right breast mass, left upper lobe lung mass  HPI: [name] is a 76 year old female with a past medical history significant for hypertension, hyperlipidemia, history of left breast cancer status postmastectomy in 1999.  She presented to the emergency department with altered mental status.  Limited history available from the patient and was supplemented by the ED provider, chart review, and the patient's sister and niece.  The patient reportedly had a progressive functional decline since the end of May.  She has decreased level of conversation, loss of appetite, and gait difficulty.  Symptoms have progressively worsened over the past week.  Family noted that she seemed to be confused and does not answer questions appropriately at times.  A recent screening mammogram was performed on 06/15/2021 which was concerning for a right breast mass.  Further evaluation was recommended with a diagnostic mammogram and possible ultrasound of the right breast.  These have been ordered but I do not see that they were ever scheduled.  She has been started on dexamethasone.  She was seen by neurosurgery who did not recommend surgical resection and instead recommended palliative radiation and hospice.  She has also been seen by palliative care for goals of care discussion.  The patient was seen in her hospital room.  No family at the bedside at the time my visit.  The patient is able to provide some history -gives brief 1-2 word answers.  Today, she reports a headache.  Thinks that her mentation may be a little clearer today.  She currently denies dizziness.  Denies chest pain and shortness of breath.  Denies abdominal pain, nausea, vomiting.  She states that she came to the hospital because of "brain cancer."  Additionally, she states that she  was aware of the right breast mass and need for further work-up.  The patient lives with her sister.  Her niece helps transport her to appointments.  She has no children.  Denies history of alcohol and tobacco use.  Family history significant for 2 sisters with breast cancer.  Medical oncology was asked to see the patient to make recommendations regarding her brain lesions and right breast mass.  Past Medical History:  Diagnosis Date   Anemia    Arthritis    Breast cancer ([place])    Cancer ([place]) 1999   Breast Cancer   Hyperlipidemia    Hypertension    Osteopenia 01/12/2021  :   Past Surgical History:  Procedure Laterality Date   BREAST SURGERY  1999   Masectomy- left   CYSTECTOMY     cyst removed off top of head   MASTECTOMY    :   Current Facility-Administered Medications  Medication Dose Route Frequency Provider Last Rate Last Admin   acetaminophen (TYLENOL) tablet 650 mg  650 mg Oral Q6H PRN Lenore Cordia, MD   650 mg at 07/25/21 2154   Or   acetaminophen (TYLENOL) suppository 650 mg  650 mg Rectal Q6H PRN Lenore Cordia, MD       amLODipine (NORVASC) tablet 10 mg  10 mg Oral Daily Zada Finders R, MD   10 mg at 07/25/21 1020   benazepril (LOTENSIN) tablet 10 mg  10 mg Oral Daily Zada Finders R, MD   10 mg at 07/25/21 1020   dexamethasone (DECADRON) injection 4 mg  4 mg Intravenous Q6H  Nelta Numbers, MD   4 mg at 07/26/21 0534   enoxaparin (LOVENOX) injection 40 mg  40 mg Subcutaneous Q24H Zada Finders R, MD   40 mg at 07/25/21 2154   ondansetron (ZOFRAN) tablet 4 mg  4 mg Oral Q6H PRN Lenore Cordia, MD       Or   ondansetron (ZOFRAN) injection 4 mg  4 mg Intravenous Q6H PRN Lenore Cordia, MD       senna-docusate (Senokot-S) tablet 1 tablet  1 tablet Oral QHS PRN Zada Finders R, MD       sodium chloride flush (NS) 0.9 % injection 3 mL  3 mL Intravenous Q12H Lenore Cordia, MD   3 mL at 07/25/21 2154   No Known Allergies:   Family History  Problem Relation  Age of Onset   Arthritis Mother    Breast cancer Sister    Cancer Sister        Breast Cancer   Diabetes Sister    Hyperlipidemia Sister    Hypertension Sister    Stroke Sister    Diabetes Brother    Hypertension Brother   :  Social History   Socioeconomic History   Marital status: Single    Spouse name: Not on file   Number of children: 0   Years of education: 12   Highest education level: High school graduate  Occupational History   Occupation: retired  Tobacco Use   Smoking status: Never   Smokeless tobacco: Never  Scientific laboratory technician Use: Never used  Substance and Sexual Activity   Alcohol use: No   Drug use: Never   Sexual activity: Yes    Birth control/protection: Post-menopausal  Other Topics Concern   Not on file  Social History Narrative   Not on file   Social Determinants of Health   Financial Resource Strain: Not on file  Food Insecurity: Not on file  Transportation Needs: Not on file  Physical Activity: Not on file  Stress: Not on file  Social Connections: Not on file  Intimate Partner Violence: Not on file  :  Review of Systems: A comprehensive 14 point review of systems was negative except as noted in the HPI.  Exam: Patient Vitals for the past 24 hrs:  BP Temp Temp src Pulse Resp SpO2  07/26/21 0810 128/73 98.1 F (36.7 C) Oral 84 17 97 %  07/26/21 0502 (!) 142/86 98.2 F (36.8 C) Oral 98 16 95 %  07/25/21 2032 -- -- -- -- 18 --  07/25/21 2024 131/72 98.5 F (36.9 C) Oral 75 (!) 22 97 %  07/25/21 1535 133/73 98.2 F (36.8 C) Oral 84 16 96 %    General: Laying in bed, no distress. Eyes:  no scleral icterus.   ENT:  There were no oropharyngeal lesions.   Lymphatics: Fullness in the left supraclavicular area Breasts: Left chest wall without overlying nodularity or palpable masses, right breast with a palpable mass Respiratory: lungs were clear bilaterally without wheezing or crackles.   Cardiovascular:  Regular rate and rhythm, S1/S2,  without murmur, rub or gallop.  There was no pedal edema.   GI:  abdomen was soft, flat, nontender, nondistended, without organomegaly.   Skin exam was without echymosis, petichae.   Neuro exam was nonfocal.      Lab Results  Component Value Date   WBC 6.7 07/25/2021   HGB 14.6 07/25/2021   HCT 44.6 07/25/2021   PLT 400 07/25/2021   GLUCOSE  154 (H) 07/25/2021   CHOL 187 07/07/2021   TRIG 83 07/07/2021   HDL 60 07/07/2021   LDLCALC 112 (H) 07/07/2021   ALT 18 07/24/2021   AST 19 07/24/2021   NA 140 07/25/2021   K 3.5 07/25/2021   CL 104 07/25/2021   CREATININE 0.66 07/25/2021   BUN 9 07/25/2021   CO2 22 07/25/2021    MR BRAIN W WO CONTRAST  Result Date: 07/24/2021 CLINICAL DATA:  Brain metastases EXAM: MRI HEAD WITHOUT AND WITH CONTRAST TECHNIQUE: Multiplanar, multiecho pulse sequences of the brain and surrounding structures were obtained without and with intravenous contrast. CONTRAST:  8.32mL GADAVIST GADOBUTROL 1 MMOL/ML IV SOLN COMPARISON:  None Available. FINDINGS: Brain: There is a large, partially necrotic intraparenchymal mass in the right temporal lobe measuring 4.4 x 3.7 cm. There is severe surrounding hyperintense T2-weighted signal. There is a heterogeneous intraparenchymal mass within the paramedian left frontal lobe measuring 3.0 x 2.6 cm, also a large amount of surrounding edema. There are 5 mm enhancing nodules in the anterior right temporal lobe and in the left temporal lobe. There is bilateral mass effect without herniation. No hydrocephalus. There is petechial hemorrhage within both masses. Vascular: Major flow voids are preserved. Skull and upper cervical spine: Normal calvarium and skull base. Visualized upper cervical spine and soft tissues are normal. Sinuses/Orbits:No paranasal sinus fluid levels or advanced mucosal thickening. No mastoid or middle ear effusion. Normal orbits. IMPRESSION: 1. Multiple intracranial metastases, including 2 large lesions of the left  frontal and right temporal lobes with severe surrounding edema. 2. Bilateral mass effect without herniation. Electronically Signed   By: Ulyses Jarred M.D.   On: 07/24/2021 21:19   CT CHEST ABDOMEN PELVIS W CONTRAST  Result Date: 07/24/2021 CLINICAL DATA:  Brain metastases, unknown primary EXAM: CT CHEST, ABDOMEN, AND PELVIS WITH CONTRAST TECHNIQUE: Multidetector CT imaging of the chest, abdomen and pelvis was performed following the standard protocol during bolus administration of intravenous contrast. RADIATION DOSE REDUCTION: This exam was performed according to the departmental dose-optimization program which includes automated exposure control, adjustment of the mA and/or kV according to patient size and/or use of iterative reconstruction technique. CONTRAST:  5mL OMNIPAQUE IOHEXOL 300 MG/ML  SOLN COMPARISON:  None Available. FINDINGS: CT CHEST FINDINGS Cardiovascular: Heart is normal size. Scattered coronary artery and aortic calcifications. Aorta normal caliber. Mediastinum/Nodes: Pre-vascular and left AP window adenopathy noted which appears low-density/necrotic. These nodes measure up to 1.8 cm in short axis diameter. Left hilar adenopathy with a short axis diameter of 11 mm. No axillary adenopathy. Lungs/Pleura: Left upper lobe mass noted measuring up to 3.6 cm on image 50 of series 4. This is concerning for lung primary. No additional lung nodules or masses. No effusions. Musculoskeletal: Necrotic appearing lobulated mass in the right breast measures 6 x 5 cm concerning for breast cancer. Prior left mastectomy. No acute bony abnormality. CT ABDOMEN PELVIS FINDINGS Hepatobiliary: Low-density lesions within the liver, favor cysts. The largest measures up to 4.9 cm. Gallbladder unremarkable. Pancreas: No focal abnormality or ductal dilatation. Spleen: No focal abnormality.  Normal size. Adrenals/Urinary Tract: Contrast material seen within the renal collecting systems bilaterally from earlier imaging.  Probable nonobstructing stones in the upper pole of the left kidney although this could represent contrast material rather than stones. Overlying scarring and cortical thinning. No hydronephrosis. Adrenal glands and urinary bladder unremarkable. Stomach/Bowel: Normal appendix. Stomach, large and small bowel grossly unremarkable. Vascular/Lymphatic: Aortic atherosclerosis. No evidence of aneurysm or adenopathy. Reproductive: Multiple lesions within  the uterus most compatible with fibroids measuring up to 5.4 cm. No adnexal masses. Other: No free fluid or free air. Musculoskeletal: No acute bony abnormality. IMPRESSION: 6 x 5 cm necrotic appearing mass within the right breast concerning for breast cancer. 3.6 cm necrotic appearing mass in the left upper lobe. This could reflect primary lung cancer or metastasis. Prevascular, AP window and left hilar adenopathy. Low-density lesions in the liver appear to be simple cysts. Aortic atherosclerosis. Fibroid uterus. No acute findings in the abdomen or pelvis. Electronically Signed   By: Rolm Baptise M.D.   On: 07/24/2021 21:12   CT HEAD WO CONTRAST  Result Date: 07/24/2021 CLINICAL DATA:  Delirium EXAM: CT HEAD WITHOUT CONTRAST TECHNIQUE: Contiguous axial images were obtained from the base of the skull through the vertex without intravenous contrast. RADIATION DOSE REDUCTION: This exam was performed according to the departmental dose-optimization program which includes automated exposure control, adjustment of the mA and/or kV according to patient size and/or use of iterative reconstruction technique. COMPARISON:  None Available. FINDINGS: Brain: Approximately 2.5 cm mass lesion in the anterior left frontal lobe (series 3, image 17). Additional probable cystic/necrotic large mass lesion centered in the right parietal and temporal lobes. Extensive edema surrounding both lesions with associated regional mass effect. No definite evidence of acute hemorrhage. There is  effacement of the ventricles due to mass effect from the above lesions. No hydrocephalus. No evidence of acute large vascular territory infarct. Vascular: Calcific intracranial atherosclerosis. Skull: No acute fracture. Sinuses/Orbits: Clear visualized sinuses. Other: No mastoid effusions. IMPRESSION: At least 2 large mass lesions in the left frontal and right parietotemporal regions, concerning for metastatic disease but incompletely assessed on this study. Extensive surrounding edema with regional mass effect. Recommend MRI with contrast for confirmation and further evaluation. Electronically Signed   By: Margaretha Sheffield M.D.   On: 07/24/2021 13:58     MR BRAIN W WO CONTRAST  Result Date: 07/24/2021 CLINICAL DATA:  Brain metastases EXAM: MRI HEAD WITHOUT AND WITH CONTRAST TECHNIQUE: Multiplanar, multiecho pulse sequences of the brain and surrounding structures were obtained without and with intravenous contrast. CONTRAST:  8.49mL GADAVIST GADOBUTROL 1 MMOL/ML IV SOLN COMPARISON:  None Available. FINDINGS: Brain: There is a large, partially necrotic intraparenchymal mass in the right temporal lobe measuring 4.4 x 3.7 cm. There is severe surrounding hyperintense T2-weighted signal. There is a heterogeneous intraparenchymal mass within the paramedian left frontal lobe measuring 3.0 x 2.6 cm, also a large amount of surrounding edema. There are 5 mm enhancing nodules in the anterior right temporal lobe and in the left temporal lobe. There is bilateral mass effect without herniation. No hydrocephalus. There is petechial hemorrhage within both masses. Vascular: Major flow voids are preserved. Skull and upper cervical spine: Normal calvarium and skull base. Visualized upper cervical spine and soft tissues are normal. Sinuses/Orbits:No paranasal sinus fluid levels or advanced mucosal thickening. No mastoid or middle ear effusion. Normal orbits. IMPRESSION: 1. Multiple intracranial metastases, including 2 large lesions  of the left frontal and right temporal lobes with severe surrounding edema. 2. Bilateral mass effect without herniation. Electronically Signed   By: Ulyses Jarred M.D.   On: 07/24/2021 21:19   CT CHEST ABDOMEN PELVIS W CONTRAST  Result Date: 07/24/2021 CLINICAL DATA:  Brain metastases, unknown primary EXAM: CT CHEST, ABDOMEN, AND PELVIS WITH CONTRAST TECHNIQUE: Multidetector CT imaging of the chest, abdomen and pelvis was performed following the standard protocol during bolus administration of intravenous contrast. RADIATION DOSE REDUCTION: This exam  was performed according to the departmental dose-optimization program which includes automated exposure control, adjustment of the mA and/or kV according to patient size and/or use of iterative reconstruction technique. CONTRAST:  58mL OMNIPAQUE IOHEXOL 300 MG/ML  SOLN COMPARISON:  None Available. FINDINGS: CT CHEST FINDINGS Cardiovascular: Heart is normal size. Scattered coronary artery and aortic calcifications. Aorta normal caliber. Mediastinum/Nodes: Pre-vascular and left AP window adenopathy noted which appears low-density/necrotic. These nodes measure up to 1.8 cm in short axis diameter. Left hilar adenopathy with a short axis diameter of 11 mm. No axillary adenopathy. Lungs/Pleura: Left upper lobe mass noted measuring up to 3.6 cm on image 50 of series 4. This is concerning for lung primary. No additional lung nodules or masses. No effusions. Musculoskeletal: Necrotic appearing lobulated mass in the right breast measures 6 x 5 cm concerning for breast cancer. Prior left mastectomy. No acute bony abnormality. CT ABDOMEN PELVIS FINDINGS Hepatobiliary: Low-density lesions within the liver, favor cysts. The largest measures up to 4.9 cm. Gallbladder unremarkable. Pancreas: No focal abnormality or ductal dilatation. Spleen: No focal abnormality.  Normal size. Adrenals/Urinary Tract: Contrast material seen within the renal collecting systems bilaterally from earlier  imaging. Probable nonobstructing stones in the upper pole of the left kidney although this could represent contrast material rather than stones. Overlying scarring and cortical thinning. No hydronephrosis. Adrenal glands and urinary bladder unremarkable. Stomach/Bowel: Normal appendix. Stomach, large and small bowel grossly unremarkable. Vascular/Lymphatic: Aortic atherosclerosis. No evidence of aneurysm or adenopathy. Reproductive: Multiple lesions within the uterus most compatible with fibroids measuring up to 5.4 cm. No adnexal masses. Other: No free fluid or free air. Musculoskeletal: No acute bony abnormality. IMPRESSION: 6 x 5 cm necrotic appearing mass within the right breast concerning for breast cancer. 3.6 cm necrotic appearing mass in the left upper lobe. This could reflect primary lung cancer or metastasis. Prevascular, AP window and left hilar adenopathy. Low-density lesions in the liver appear to be simple cysts. Aortic atherosclerosis. Fibroid uterus. No acute findings in the abdomen or pelvis. Electronically Signed   By: Rolm Baptise M.D.   On: 07/24/2021 21:12   CT HEAD WO CONTRAST  Result Date: 07/24/2021 CLINICAL DATA:  Delirium EXAM: CT HEAD WITHOUT CONTRAST TECHNIQUE: Contiguous axial images were obtained from the base of the skull through the vertex without intravenous contrast. RADIATION DOSE REDUCTION: This exam was performed according to the departmental dose-optimization program which includes automated exposure control, adjustment of the mA and/or kV according to patient size and/or use of iterative reconstruction technique. COMPARISON:  None Available. FINDINGS: Brain: Approximately 2.5 cm mass lesion in the anterior left frontal lobe (series 3, image 17). Additional probable cystic/necrotic large mass lesion centered in the right parietal and temporal lobes. Extensive edema surrounding both lesions with associated regional mass effect. No definite evidence of acute hemorrhage. There  is effacement of the ventricles due to mass effect from the above lesions. No hydrocephalus. No evidence of acute large vascular territory infarct. Vascular: Calcific intracranial atherosclerosis. Skull: No acute fracture. Sinuses/Orbits: Clear visualized sinuses. Other: No mastoid effusions. IMPRESSION: At least 2 large mass lesions in the left frontal and right parietotemporal regions, concerning for metastatic disease but incompletely assessed on this study. Extensive surrounding edema with regional mass effect. Recommend MRI with contrast for confirmation and further evaluation. Electronically Signed   By: Margaretha Sheffield M.D.   On: 07/24/2021 13:58    Assessment and Plan:   This is a 77 year old female who presented to the hospital with altered mental  status.  She was found to have brain lesions concerning for metastatic disease.  She was also noted to have a right breast mass and left lung mass on CT imaging.  She has a history of left breast cancer and is status postmastectomy in 1999.  She has been seen by neurosurgery and surgery is not recommended.  Radiation oncology consult is currently pending.  I discussed the imaging findings with the patient today.  Findings likely represent metastatic breast cancer but she is also noted to have a left lung mass which could represent underlying lung cancer versus metastatic disease.  Recommend continuation of dexamethasone.  Agree with radiation oncology consult for consideration of whole brain radiation.  Dexamethasone to be tapered per radiation oncology.  Recommend proceeding with a biopsy of her right breast mass to confirm the diagnosis and provide guidance for systemic treatment.  A right breast biopsy has been ordered.  Not sure if this will be done inpatient as breast biopsies are typically done at the breast center.  However, would be best to go ahead and proceed with a biopsy while the patient is in the hospital to expedite discussion of  systemic treatment options.  Thank you for this referral.   Mikey Bussing, DNP, AGPCNP-BC, AOCNP   Attending Note  I personally saw the patient, reviewed the chart and examined the patient. The plan of care was discussed with the patient and the admitting team. I agree with the assessment and plan as documented above. Thank you very much for the consultation.  I saw this patient this morning.  We have discussed about the possibility of metastatic breast cancer.  On physical exam she has a large mass in the right breast occupying pretty much the whole central portion of the right breast also associated with some nipple discharge. She also has a left upper lobe necrotic mass.  I wonder if she has 2 primaries going on, the pattern of spread is a bit atypical for breast given the large necrotic mass in the left upper lobe and associated left hilar adenopathy. If she wishes to pursue treatment, I would recommend that she consider both breast biopsy as well as lung biopsy since brain biopsy could be morbid.  Once again breast biopsies are typically not done in patients not quite sure if this can be facilitated while she is in the hospital.    At this time for the brain metastasis, agree with radiation oncology consultation and recommendations.  We will need a biopsy before we can discuss any medical oncology recommendations.  We will plan to follow-up with her outpatient in the breast clinic.

## 2021-07-26 NOTE — Progress Notes (Signed)
Steri strips applied to right breast biopsy site and ice pack applied to site, per MD order.  Tolerated well by patient, no complaints offered.

## 2021-07-26 NOTE — Progress Notes (Signed)
                                                                                                                                                                                                          Palliative Medicine Progress Note   Patient Name: Jennifer Cowan       Date: 07/26/2021 DOB: Mar 15, 1945  Age: 76 y.o. MRN#: [id number] Attending Physician: Dwyane Dee, MD Primary Care Physician: [name], FNP Admit Date: 07/24/2021  Reason for Consultation/Follow-up: {Reason for Consult:23484}  HPI/Patient Profile: ***  Subjective: ***  Objective:  Physical Exam          Vital Signs: BP 128/73 (BP Location: Left Arm)   Pulse 84   Temp 98.1 F (36.7 C) (Oral)   Resp 17   Ht 5' 7.99" (1.727 m)   Wt 88.7 kg   SpO2 97%   BMI 29.74 kg/m  SpO2: SpO2: 97 % O2 Device: O2 Device: Room Air O2 Flow Rate:    Intake/output summary:  Intake/Output Summary (Last 24 hours) at 07/26/2021 1418 Last data filed at 07/26/2021 1000 Gross per 24 hour  Intake 600 ml  Output 200 ml  Net 400 ml    LBM: Last BM Date : 07/26/21     Palliative Assessment/Data: ***     Palliative Medicine Assessment & Plan   Assessment: Principal Problem:   Brain masses Active Problems:   Primary hypertension   Mixed hyperlipidemia   Breast mass, right   Mass of upper lobe of left lung    Recommendations/Plan: ***  Goals of Care and Additional Recommendations: Limitations on Scope of Treatment: {Recommended Scope and Preferences:21019}  Code Status:   Prognosis:  {Palliative Care Prognosis:23504}  Discharge Planning: {Palliative dispostion:23505}  Care plan was discussed with ***  Thank you for allowing the Palliative Medicine Team to assist in the care of this patient.   ***   Lavena Bullion, NP   Please contact Palliative Medicine Team phone at [phone] for questions and concerns.  For individual providers, please see [facility].

## 2021-07-26 NOTE — Consult Note (Signed)
Radiation Oncology         [phone] ________________________________  Name: Jennifer Cowan        MRN: [id number]  Date of Service: 07/26/21  DOB: Apr 09, 1945  UX:LKGMWN, Arman Bogus, FNP  No ref. provider found     REFERRING PHYSICIAN: No ref. provider found   DIAGNOSIS: The encounter diagnosis was Altered mental status, unspecified altered mental status type.   HISTORY OF PRESENT ILLNESS: Jennifer Cowan is a 76 y.o. female seen at the request of Dr. Marland Kitchen    PREVIOUS RADIATION THERAPY: {EXAM; YES/NO:19492::"No"}   PAST MEDICAL HISTORY:  Past Medical History:  Diagnosis Date   Anemia    Arthritis    Breast cancer ([place])    Cancer ([place]) 1999   Breast Cancer   Hyperlipidemia    Hypertension    Osteopenia 01/12/2021       PAST SURGICAL HISTORY: Past Surgical History:  Procedure Laterality Date   BREAST SURGERY  1999   Masectomy- left   CYSTECTOMY     cyst removed off top of head   MASTECTOMY       FAMILY HISTORY:  Family History  Problem Relation Age of Onset   Arthritis Mother    Breast cancer Sister    Cancer Sister        Breast Cancer   Diabetes Sister    Hyperlipidemia Sister    Hypertension Sister    Stroke Sister    Diabetes Brother    Hypertension Brother      SOCIAL HISTORY:  reports that she has never smoked. She has never used smokeless tobacco. She reports that she does not drink alcohol and does not use drugs.   ALLERGIES: Patient has no known allergies.   MEDICATIONS:  Current Facility-Administered Medications  Medication Dose Route Frequency Provider Last Rate Last Admin   acetaminophen (TYLENOL) tablet 650 mg  650 mg Oral Q6H PRN Lenore Cordia, MD   650 mg at 07/25/21 2154   Or   acetaminophen (TYLENOL) suppository 650 mg  650 mg Rectal Q6H PRN Lenore Cordia, MD       amLODipine (NORVASC) tablet 10 mg  10 mg Oral Daily Zada Finders R, MD   10 mg at 07/25/21 1020   benazepril (LOTENSIN) tablet 10 mg  10 mg Oral Daily Zada Finders R, MD   10 mg at 07/25/21 1020   dexamethasone (DECADRON) injection 4 mg  4 mg Intravenous Q6H Paternite, Shanon Brow, MD   4 mg at 07/26/21 0534   enoxaparin (LOVENOX) injection 40 mg  40 mg Subcutaneous Q24H Zada Finders R, MD   40 mg at 07/25/21 2154   ondansetron (ZOFRAN) tablet 4 mg  4 mg Oral Q6H PRN Lenore Cordia, MD       Or   ondansetron (ZOFRAN) injection 4 mg  4 mg Intravenous Q6H PRN Lenore Cordia, MD       senna-docusate (Senokot-S) tablet 1 tablet  1 tablet Oral QHS PRN Zada Finders R, MD       sodium chloride flush (NS) 0.9 % injection 3 mL  3 mL Intravenous Q12H Lenore Cordia, MD   3 mL at 07/25/21 2154     REVIEW OF SYSTEMS: On review of systems, the patient reports that *** is doing well overall. *** denies any chest pain, shortness of breath, cough, fevers, chills, night sweats, unintended weight changes. *** denies any bowel or bladder disturbances, and denies abdominal pain, nausea or vomiting. ***  denies any new musculoskeletal or joint aches or pains. A complete review of systems is obtained and is otherwise negative.     PHYSICAL EXAM:  Wt Readings from Last 3 Encounters:  07/25/21 195 lb 8.8 oz (88.7 kg)  07/07/21 196 lb 4 oz (89 kg)  01/06/21 208 lb 8 oz (94.6 kg)   Temp Readings from Last 3 Encounters:  07/26/21 98.1 F (36.7 C) (Oral)  07/24/21 97.7 F (36.5 C) (Oral)  07/07/21 98 F (36.7 C) (Temporal)   BP Readings from Last 3 Encounters:  07/26/21 128/73  07/24/21 (!) 144/80  07/07/21 131/67   Pulse Readings from Last 3 Encounters:  07/26/21 84  07/24/21 83  07/07/21 97   Pain Assessment Pain Score: 0-No pain/10  In general this is a well appearing *** in no acute distress. ***'s alert and oriented x4 and appropriate throughout the examination. Cardiopulmonary assessment is negative for acute distress and *** exhibits normal effort.     ECOG = ***  0 - Asymptomatic (Fully active, able to carry on all predisease activities without  restriction)  1 - Symptomatic but completely ambulatory (Restricted in physically strenuous activity but ambulatory and able to carry out work of a light or sedentary nature. For example, light housework, office work)  2 - Symptomatic, <50% in bed during the day (Ambulatory and capable of all self care but unable to carry out any work activities. Up and about more than 50% of waking hours)  3 - Symptomatic, >50% in bed, but not bedbound (Capable of only limited self-care, confined to bed or chair 50% or more of waking hours)  4 - Bedbound (Completely disabled. Cannot carry on any self-care. Totally confined to bed or chair)  5 - Death   Eustace Pen MM, Creech RH, Tormey DC, et al. [id number]). "Toxicity and response criteria of the [facility]". [place] Oncol. 5 (6): 649-55    LABORATORY DATA:  Lab Results  Component Value Date   WBC 6.7 07/25/2021   HGB 14.6 07/25/2021   HCT 44.6 07/25/2021   MCV 89.0 07/25/2021   PLT 400 07/25/2021   Lab Results  Component Value Date   NA 140 07/25/2021   K 3.5 07/25/2021   CL 104 07/25/2021   CO2 22 07/25/2021   Lab Results  Component Value Date   ALT 18 07/24/2021   AST 19 07/24/2021   ALKPHOS 71 07/24/2021   BILITOT 0.6 07/24/2021      RADIOGRAPHY: MR BRAIN W WO CONTRAST  Result Date: 07/24/2021 CLINICAL DATA:  Brain metastases EXAM: MRI HEAD WITHOUT AND WITH CONTRAST TECHNIQUE: Multiplanar, multiecho pulse sequences of the brain and surrounding structures were obtained without and with intravenous contrast. CONTRAST:  8.51mL GADAVIST GADOBUTROL 1 MMOL/ML IV SOLN COMPARISON:  None Available. FINDINGS: Brain: There is a large, partially necrotic intraparenchymal mass in the right temporal lobe measuring 4.4 x 3.7 cm. There is severe surrounding hyperintense T2-weighted signal. There is a heterogeneous intraparenchymal mass within the paramedian left frontal lobe measuring 3.0 x 2.6 cm, also a large amount of surrounding  edema. There are 5 mm enhancing nodules in the anterior right temporal lobe and in the left temporal lobe. There is bilateral mass effect without herniation. No hydrocephalus. There is petechial hemorrhage within both masses. Vascular: Major flow voids are preserved. Skull and upper cervical spine: Normal calvarium and skull base. Visualized upper cervical spine and soft tissues are normal. Sinuses/Orbits:No paranasal sinus fluid levels or advanced mucosal thickening. No  mastoid or middle ear effusion. Normal orbits. IMPRESSION: 1. Multiple intracranial metastases, including 2 large lesions of the left frontal and right temporal lobes with severe surrounding edema. 2. Bilateral mass effect without herniation. Electronically Signed   By: Ulyses Jarred M.D.   On: 07/24/2021 21:19   CT CHEST ABDOMEN PELVIS W CONTRAST  Result Date: 07/24/2021 CLINICAL DATA:  Brain metastases, unknown primary EXAM: CT CHEST, ABDOMEN, AND PELVIS WITH CONTRAST TECHNIQUE: Multidetector CT imaging of the chest, abdomen and pelvis was performed following the standard protocol during bolus administration of intravenous contrast. RADIATION DOSE REDUCTION: This exam was performed according to the departmental dose-optimization program which includes automated exposure control, adjustment of the mA and/or kV according to patient size and/or use of iterative reconstruction technique. CONTRAST:  3mL OMNIPAQUE IOHEXOL 300 MG/ML  SOLN COMPARISON:  None Available. FINDINGS: CT CHEST FINDINGS Cardiovascular: Heart is normal size. Scattered coronary artery and aortic calcifications. Aorta normal caliber. Mediastinum/Nodes: Pre-vascular and left AP window adenopathy noted which appears low-density/necrotic. These nodes measure up to 1.8 cm in short axis diameter. Left hilar adenopathy with a short axis diameter of 11 mm. No axillary adenopathy. Lungs/Pleura: Left upper lobe mass noted measuring up to 3.6 cm on image 50 of series 4. This is concerning  for lung primary. No additional lung nodules or masses. No effusions. Musculoskeletal: Necrotic appearing lobulated mass in the right breast measures 6 x 5 cm concerning for breast cancer. Prior left mastectomy. No acute bony abnormality. CT ABDOMEN PELVIS FINDINGS Hepatobiliary: Low-density lesions within the liver, favor cysts. The largest measures up to 4.9 cm. Gallbladder unremarkable. Pancreas: No focal abnormality or ductal dilatation. Spleen: No focal abnormality.  Normal size. Adrenals/Urinary Tract: Contrast material seen within the renal collecting systems bilaterally from earlier imaging. Probable nonobstructing stones in the upper pole of the left kidney although this could represent contrast material rather than stones. Overlying scarring and cortical thinning. No hydronephrosis. Adrenal glands and urinary bladder unremarkable. Stomach/Bowel: Normal appendix. Stomach, large and small bowel grossly unremarkable. Vascular/Lymphatic: Aortic atherosclerosis. No evidence of aneurysm or adenopathy. Reproductive: Multiple lesions within the uterus most compatible with fibroids measuring up to 5.4 cm. No adnexal masses. Other: No free fluid or free air. Musculoskeletal: No acute bony abnormality. IMPRESSION: 6 x 5 cm necrotic appearing mass within the right breast concerning for breast cancer. 3.6 cm necrotic appearing mass in the left upper lobe. This could reflect primary lung cancer or metastasis. Prevascular, AP window and left hilar adenopathy. Low-density lesions in the liver appear to be simple cysts. Aortic atherosclerosis. Fibroid uterus. No acute findings in the abdomen or pelvis. Electronically Signed   By: Rolm Baptise M.D.   On: 07/24/2021 21:12   CT HEAD WO CONTRAST  Result Date: 07/24/2021 CLINICAL DATA:  Delirium EXAM: CT HEAD WITHOUT CONTRAST TECHNIQUE: Contiguous axial images were obtained from the base of the skull through the vertex without intravenous contrast. RADIATION DOSE REDUCTION:  This exam was performed according to the departmental dose-optimization program which includes automated exposure control, adjustment of the mA and/or kV according to patient size and/or use of iterative reconstruction technique. COMPARISON:  None Available. FINDINGS: Brain: Approximately 2.5 cm mass lesion in the anterior left frontal lobe (series 3, image 17). Additional probable cystic/necrotic large mass lesion centered in the right parietal and temporal lobes. Extensive edema surrounding both lesions with associated regional mass effect. No definite evidence of acute hemorrhage. There is effacement of the ventricles due to mass effect from the above lesions.  No hydrocephalus. No evidence of acute large vascular territory infarct. Vascular: Calcific intracranial atherosclerosis. Skull: No acute fracture. Sinuses/Orbits: Clear visualized sinuses. Other: No mastoid effusions. IMPRESSION: At least 2 large mass lesions in the left frontal and right parietotemporal regions, concerning for metastatic disease but incompletely assessed on this study. Extensive surrounding edema with regional mass effect. Recommend MRI with contrast for confirmation and further evaluation. Electronically Signed   By: Margaretha Sheffield M.D.   On: 07/24/2021 13:58       IMPRESSION/PLAN: 1.   In a visit lasting *** minutes, greater than 50% of the time was spent face to face discussing the patient's condition, in preparation for the discussion, and coordinating the patient's care.        Carola Rhine, [facility]   **Disclaimer: This note was dictated with voice recognition software. Similar sounding words can inadvertently be transcribed and this note may contain transcription errors which may not have been corrected upon publication of note.**

## 2021-07-27 DIAGNOSIS — R4182 Altered mental status, unspecified: Secondary | ICD-10-CM | POA: Diagnosis not present

## 2021-07-27 DIAGNOSIS — R5381 Other malaise: Secondary | ICD-10-CM | POA: Diagnosis not present

## 2021-07-27 DIAGNOSIS — N6310 Unspecified lump in the right breast, unspecified quadrant: Secondary | ICD-10-CM | POA: Diagnosis not present

## 2021-07-27 DIAGNOSIS — G9389 Other specified disorders of brain: Secondary | ICD-10-CM | POA: Diagnosis not present

## 2021-07-27 DIAGNOSIS — R918 Other nonspecific abnormal finding of lung field: Secondary | ICD-10-CM | POA: Diagnosis not present

## 2021-07-27 NOTE — Progress Notes (Signed)
Progress Note    Jennifer Cowan   NFA:[id number]  DOB: 19-Sep-1945  DOA: 07/24/2021     3 PCP: Jennifer Perking, FNP  Initial CC: AMS, weakness  Hospital Course: Jennifer Cowan is a 76 y.o. female with medical history significant for HTN, HLD, left breast cancer s/p mastectomy in 1999 who presented with progressive functional decline (confusion, diminished ambulation). She underwent mammogram in May which was concerning for a right breast mass with calcifications.  She was recommended for biopsy but has not yet undergone prior to admission. She underwent work-up regarding her confusion.  Initial CT head showed 2 large brain lesions/masses with surrounding edema and regional mass effect.  She then underwent MRI brain for further evaluation.  This confirmed multiple intracranial masses, including 2 large lesions involving the left frontal and right temporal lobes with severe surrounding vasogenic edema.  Bilateral mass effect noted without herniation.  She was started on Decadron and neurosurgery was consulted. Extensive goals of care discussions were held the following morning with her family.  They are still discussing amongst themselves but they are amenable for at least consideration of brain radiation at this time. Palliative care also consulted along with medical oncology and radiation oncology.  IR consulted for discussion regarding right breast mass biopsy. She was able to undergo R breast biopsy on 07/26/21.   Interval History:  Sister present bedside this morning.  No events overnight.  Patient is resting in bed about the same as yesterday.  After discussion, they wish to continue waiting on breast biopsy results before deciding more about lung biopsy or any other treatment.  Assessment and Plan: * Brain masses - MRI brain confirms multiple intracranial masses.  The largest to involve the left frontal and right temporal lobes (right temporal largest).  There is surrounding vasogenic edema  and bilateral mass effect, no herniation - Main differential at this time includes primary right breast cancer with metastasis to the left upper lung and brain mets (possible for still 2 primaries notably the breast and LUL lung mass) - Evaluated by neurosurgery, greatly appreciate assistance.  Surgical intervention would be high risk and is not recommended nor lead to good quality outcomes given clinical presentation - Family discussing in depth amongst themselves as patient is not able to largely interact or show ability to make informed decisions; currently they are considering brain radiation but still also possibility of pure comfort care/hospice -Palliative care following for [place] discussions, greatly appreciate assistance -Dr. Lisbeth Renshaw with radiation oncology aware of patient.  Family still discussing decision regarding pursuing radiation versus possibly just hospice -Continue Decadron for now and focusing mostly on patient comfort if she does become more symptomatic; family seems to understand poor prognosis overall and that aggressive care not likely to make a big difference  Breast mass, right - Noted initially on outpatient mammogram in May 2023.  Patient has not yet undergone biopsy prior to this time - [place] is her clinical presentation most consistent with primary breast cancer now with mets to the lung and brain (versus a primary lung lesion too) - Oncology aware - successful R breast biopsy on 7/10 with Dr. Dimas Aguas; follow up biopsy results which should take ~ 24 hours  Mass of upper lobe of left lung - CT shows 3.6 cm necrotic mass involving left upper lobe.  Concern is for metastasis from primary breast cancer versus also a 2nd primary lesion/mass - see discussions above - asymptomatic in terms of SOB/cough; on RA - oncology  and rad onc now recommending LUL biopsy as well to guide possible treatment; discussed with family; they wish to wait on breast biopsy results then decide  if they wish for further biopsy and/or treatment  Physical deconditioning - Due to underlying metastatic cancer - Evaluated by PT with recommendations for home health.  DME also ordered  Mixed hyperlipidemia - Discontinue statin given no further mortality benefit in setting of presumed stage IV cancer   Primary hypertension Continue home amlodipine and benazepril.    Old records reviewed in assessment of this patient  Antimicrobials:   DVT prophylaxis:  enoxaparin (LOVENOX) injection 40 mg Start: 07/24/21 2000   Code Status:   Code Status: Full Code  Mobility Assessment (last 72 hours)     Mobility Assessment     Row Name 07/27/21 0735 07/26/21 1930 07/26/21 1200 07/26/21 0752 07/25/21 1928   Does patient have an order for bedrest or is patient medically unstable No - Continue assessment No - Continue assessment -- No - Continue assessment No - Continue assessment   What is the highest level of mobility based on the progressive mobility assessment? Level 4 (Walks with assist in room) - Balance while marching in place and cannot step forward and back - Complete Level 4 (Walks with assist in room) - Balance while marching in place and cannot step forward and back - Complete Level 4 (Walks with assist in room) - Balance while marching in place and cannot step forward and back - Complete Level 4 (Walks with assist in room) - Balance while marching in place and cannot step forward and back - Complete Level 4 (Walks with assist in room) - Balance while marching in place and cannot step forward and back - Complete    Row Name 07/25/21 1700 07/25/21 1023 07/24/21 2236       Does patient have an order for bedrest or is patient medically unstable -- No - Continue assessment No - Continue assessment     What is the highest level of mobility based on the progressive mobility assessment? Level 4 (Walks with assist in room) - Balance while marching in place and cannot step forward and back -  Complete Level 4 (Walks with assist in room) - Balance while marching in place and cannot step forward and back - Complete Level 4 (Walks with assist in room) - Balance while marching in place and cannot step forward and back - Complete              Disposition Plan: Pending further work-up and family decisions Status is: Inpatient  Objective: Blood pressure 130/73, pulse 69, temperature 98.1 F (36.7 C), temperature source Oral, resp. rate 18, height 5' 7.99" (1.727 m), weight 86.6 kg, SpO2 97 %.  Examination:  Physical Exam Constitutional:      Comments: Chronically ill-appearing elderly woman lying in bed in no distress. Slightly less confused and can answer more questions with short answers  HENT:     Head: Normocephalic and atraumatic.     Mouth/Throat:     Mouth: Mucous membranes are moist.  Eyes:     Extraocular Movements: Extraocular movements intact.  Cardiovascular:     Rate and Rhythm: Normal rate and regular rhythm.  Pulmonary:     Effort: Pulmonary effort is normal.     Breath sounds: Normal breath sounds.  Abdominal:     General: Bowel sounds are normal. There is no distension.     Palpations: Abdomen is soft.  Tenderness: There is no abdominal tenderness.  Musculoskeletal:        General: No swelling.     Cervical back: Normal range of motion and neck supple.  Skin:    General: Skin is warm and dry.  Neurological:     Mental Status: She is disoriented.     Comments: Follows commands and moves all 4 extremities      Consultants:  Neurosurgery Palliative care medicine Medical oncology Radiation oncology Interventional radiology  Procedures:  Right breast biopsy, 07/26/21  Data Reviewed: No results found for this or any previous visit (from the past 24 hour(s)).   I have Reviewed nursing notes, Vitals, and Lab results since pt's last encounter. Pertinent lab results : see above I have reviewed the last note from staff over past 24 hours I have  discussed pt's care plan and test results with nursing staff, case manager   LOS: 3 days   Dwyane Dee, MD [facility] 07/27/2021, 2:11 PM

## 2021-07-27 NOTE — Progress Notes (Signed)
Mobility Specialist Progress Note:   07/27/21 1530  Mobility  Activity Transferred from chair to bed  Level of Assistance Minimal assist, patient does 75% or more (+2)  Assistive Device Front wheel walker  Distance Ambulated (ft) 5 ft  Activity Response Tolerated well  $Mobility charge 1 Mobility   Pt requesting to get back to bed. Required minA+2 to stand from recliner, minA of one to take steps towards bed + steps toward [facility]. Pt back in bed with all needs met.   Nelta Numbers Acute Rehab Secure Chat or Office Phone: [phone]

## 2021-07-27 NOTE — TOC CM/SW Note (Signed)
    Durable Medical Equipment  (From admission, onward)           Start     Ordered   07/27/21 1127  For home use only DME Walker rolling  Once       Question Answer Comment  Walker: With 5 Inch Wheels   Patient needs a walker to treat with the following condition Weakness      07/27/21 1127   07/27/21 1127  For home use only DME 3 n 1  Once        07/27/21 1127   07/27/21 1127  For home use only DME standard manual wheelchair with seat cushion  Once       Comments: Patient suffers from Brain masses which impairs their ability to perform daily activities like ambulating  in the home.  A cane  will not resolve issue with performing activities of daily living. A wheelchair will allow patient to safely perform daily activities. Patient can safely propel the wheelchair in the home or has a caregiver who can provide assistance. Length of need lifetime . Accessories: elevating leg rests (ELRs), wheel locks, extensions and anti-tippers.  Seat and back cushions   07/27/21 1127

## 2021-07-27 NOTE — Progress Notes (Signed)
Physical Therapy Treatment Patient Details Name: Jennifer Cowan MRN: [id number] DOB: 1945-12-26 Today's Date: 07/27/2021   History of Present Illness Jennifer Cowan is a 76 y.o. female  who presented with progressive functional decline (confusion, diminished ambulation); Imaging showing brain masses and a breast mass and lung mass; with medical history significant for HTN, HLD, left breast cancer s/p mastectomy in 1999    PT Comments    Continuing work on functional mobility and activity tolerance;  session focused on progressive ambulation, and pt, sister, and niece walked the hallway together; Pt used RW for steadiness, needing occasional cues for keeping RW close; continue to recommend going home with HHPT follow up    Recommendations for follow up therapy are one component of a multi-disciplinary discharge planning process, led by the attending physician.  Recommendations may be updated based on patient status, additional functional criteria and insurance authorization.  Follow Up Recommendations  Home health PT     Assistance Recommended at Discharge Set up Supervision/Assistance  Patient can return home with the following Assistance with cooking/housework;Help with stairs or ramp for entrance;Assist for transportation   Equipment Recommendations  Rolling walker (2 wheels);Wheelchair (measurements PT);Wheelchair cushion (measurements PT);BSC/3in1;Other (comment) (Can wait on wheelchair, but anticipate she will eventually need one)    Recommendations for Other Services       Precautions / Restrictions Precautions Precautions: Fall     Mobility  Bed Mobility Overal bed mobility: Needs Assistance Bed Mobility: Supine to Sit     Supine to sit: Min guard, HOB elevated     General bed mobility comments: Min assist to square off hips at EOB    Transfers Overall transfer level: Needs assistance Equipment used: Rolling walker (2 wheels) Transfers: Sit to/from Stand Sit to  Stand: Min assist, Mod assist           General transfer comment: Min guard from bed with handheld assist. Mod A from toilet with grab bars d/t lower surface height    Ambulation/Gait Ambulation/Gait assistance: Min guard Gait Distance (Feet): 130 Feet (x2) Assistive device: Rolling walker (2 wheels) Gait Pattern/deviations: Step-through pattern Gait velocity: slow     General Gait Details: Mildly unsteady, slow cadence; Cues for RW proximity   Stairs             Wheelchair Mobility    Modified Rankin (Stroke Patients Only)       Balance     Sitting balance-Leahy Scale: Good       Standing balance-Leahy Scale: Poor                              Cognition Arousal/Alertness: Awake/alert Behavior During Therapy: Flat affect Overall Cognitive Status: Impaired/Different from baseline Area of Impairment: Problem solving, Memory, Following commands, Awareness                     Memory: Decreased short-term memory Following Commands: Follows one step commands consistently                Exercises      General Comments General comments (skin integrity, edema, etc.): Sister, Jennifer Cowan, present, hands on to assist pt      Pertinent Vitals/Pain Pain Assessment Pain Assessment: No/denies pain    Home Living                          Prior Function  PT Goals (current goals can now be found in the care plan section) Acute Rehab PT Goals Patient Stated Goal: did not sepcifically state PT Goal Formulation: With patient/family Time For Goal Achievement: 08/08/21 Potential to Achieve Goals: Good Progress towards PT goals: Progressing toward goals    Frequency    Min 3X/week      PT Plan Current plan remains appropriate    Co-evaluation              AM-PAC PT "6 Clicks" Mobility   Outcome Measure  Help needed turning from your back to your side while in a flat bed without using bedrails?:  None Help needed moving from lying on your back to sitting on the side of a flat bed without using bedrails?: A Little Help needed moving to and from a bed to a chair (including a wheelchair)?: A Little Help needed standing up from a chair using your arms (e.g., wheelchair or bedside chair)?: A Little Help needed to walk in hospital room?: A Little Help needed climbing 3-5 steps with a railing? : A Lot 6 Click Score: 18    End of Session   Activity Tolerance: Patient tolerated treatment well Patient left: in chair;with call bell/phone within reach;with chair alarm set Nurse Communication: Mobility status PT Visit Diagnosis: Unsteadiness on feet (R26.81)     Time: 1324-1400 PT Time Calculation (min) (ACUTE ONLY): 36 min  Charges:  $Gait Training: 23-37 mins $Therapeutic Activity: 8-22 mins                     Roney Marion, PT  Acute Rehabilitation Services Office [phone]    Colletta Maryland 07/27/2021, 4:26 PM

## 2021-07-27 NOTE — Assessment & Plan Note (Signed)
-   Due to underlying metastatic cancer - Evaluated by PT with recommendations for home health.  DME also ordered

## 2021-07-27 NOTE — Care Management Important Message (Signed)
Important Message  Patient Details  Name: Jennifer Cowan MRN: [id number] Date of Birth: 18-Jan-1945   Medicare Important Message Given:  Yes     Orbie Pyo 07/27/2021, 3:18 PM

## 2021-07-27 NOTE — Progress Notes (Signed)
I discussed her case with Dr. Serafina Royals in IR and he recommends pulmonary consult for bronchoscopy. Orders were placed, and if bronchoscopy cannot be performed, IR would consider CT biopsy.     Carola Rhine, PAC

## 2021-07-27 NOTE — Progress Notes (Signed)
Palliative Medicine Progress Note   Patient Name: Jennifer Cowan       Date: 07/27/2021 DOB: 1945-07-01  Age: 76 y.o. MRN#: [id number] Attending Physician: Dwyane Dee, MD Primary Care Physician: [name], FNP Admit Date: 07/24/2021    HPI/Patient Profile: 76 y.o. female  with past medical history of left breast cancer s/p mastectomy in 1999, hypertension, and hyperlipidemia who presented to [facility] ED on 07/24/2021 with progressive functional decline (confusion, diminished ambulation).  She underwent a screening mammogram in May which was concerning for a right breast mass with calcifications and was recommended for biopsy but has not yet undergone this. In the ED, head CT showed 2 large brain masses with surrounding edema and regional mass effect.  MRI confirmed multiple intracranial masses, including 2 large lesions involving left frontal and right temporal lobes with severe surrounding vasogenic edema.  She was started on Decadron. Neurosurgery was consulted and has recommended palliative radiation.  Subjective: Chart reviewed.  Note that Patient underwent ultrasound guided right breast biopsy yesterday 7/10.  I went to see patient at bedside.  She has no acute complaints.  Her niece Geraldo Pitter is at bedside.  Discussed that results of biopsy will take several days.  I asked [name] if family had made a decision on code status as per our discussion yesterday.  [name] wants this to be explained and decided by [facility] herself if possible.  I encouraged Leean to consider DNR/DNI status, and provided education that resuscitation efforts usually have poor outcomes in the setting of terminal/noncurable disease.  I explained that DNR/DNI does not change the medical plan and only comes in to affect after a  person has arrested (died).  It is a protective measure to keep [name] from harming patient in their last moments of life.  I am not sure if Valor understood this discussion as she is not able to repeat this information back to me.  [name] states that they will continue to discuss as a family, but are not ready to make a decision on code status today.   Objective:  Physical Exam Vitals reviewed.  Constitutional:      General: She is not in acute distress.    Appearance: She is ill-appearing.  Neurological:     Mental Status: She is alert.     Motor: Weakness present.  Comments: Oriented to person and place  Psychiatric:        Mood and Affect: Affect is flat.             Vital Signs: BP 130/73 (BP Location: Left Arm)   Pulse 69   Temp 98.1 F (36.7 C) (Oral)   Resp 18   Ht 5' 7.99" (1.727 m)   Wt 86.6 kg   SpO2 97%   BMI 29.04 kg/m  SpO2: SpO2: 97 % O2 Device: O2 Device: Room Air O2 Flow Rate:      LBM: Last BM Date : 07/26/21     Palliative Assessment/Data: PPS 40%     Palliative Medicine Assessment & Plan   Assessment: Principal Problem:   Brain masses Active Problems:   Primary hypertension   Mixed hyperlipidemia   Breast mass, right   Mass of upper lobe of left lung   Physical deconditioning    Recommendations/Plan: Full code for now with ongoing discussion Continue current interventions Awaiting biopsy results PMT will continue to follow   Prognosis: Poor overall in the setting of what is most likely metastatic cancer  Discharge Planning: To Be Determined    Thank you for allowing the Palliative Medicine Team to assist in the care of this patient.   MDM - High   Lavena Bullion, NP   Please contact Palliative Medicine Team phone at [phone] for questions and concerns.  For individual providers, please see AMION.

## 2021-07-27 NOTE — TOC Initial Note (Signed)
Transition of Care [facility]) - Initial/Assessment Note    Patient Details  Name: Jennifer Cowan MRN: [id number] Date of Birth: Apr 20, 1945  Transition of Care [facility]) CM/SW Contact:    Marilu Favre, RN Phone Number: 07/27/2021, 11:21 AM  Clinical Narrative:                  Spoke to patient and her sister at bedside.   Confirmed face sheet information.   Patient's phone is [phone]  They report cell phone that is listed is patient's niece [name] [phone]   PCP is Marjorie Smolder   Patient has transportation to appointments and can get prescriptions filled.   Currently PT recommending HHPT, walker, 3 in 1 and wheelchair .   Both are open to HHPT .No preference on home health agency.  Discussed NCM will await treatment plan to arrange.   Will place DME orders . NCM will wait to call for DME until anticipated discharge date known .   Both voiced understanding    Expected Discharge Plan:  (waiting on treatment plan) Barriers to Discharge: Continued Medical Work up   Patient Goals and CMS Choice Patient states their goals for this hospitalization and ongoing recovery are:: to return to home CMS Medicare.gov Compare Post Acute Care list provided to:: Patient Choice offered to / list presented to : Patient  Expected Discharge Plan and Services Expected Discharge Plan:  (waiting on treatment plan)   Discharge Planning Services: CM Consult Post Acute Care Choice: [place] arrangements for the past 2 months: Single Family Home                 DME Arranged: 3-N-1, Walker rolling, Wheelchair manual         HH Arranged: PT          Prior Living Arrangements/Services Living arrangements for the past 2 months: Single Family Home Lives with:: Siblings Patient language and need for interpreter reviewed:: Yes Do you feel safe going back to the place where you live?: Yes      Need for Family Participation in Patient Care: Yes (Comment) Care giver support  system in place?: Yes (comment)   Criminal Activity/Legal Involvement Pertinent to Current Situation/Hospitalization: No - Comment as needed  Activities of Daily Living Home Assistive Devices/Equipment: None ADL Screening (condition at time of admission) Patient's cognitive ability adequate to safely complete daily activities?: No Is the patient deaf or have difficulty hearing?: No Does the patient have difficulty seeing, even when wearing glasses/contacts?: No Does the patient have difficulty concentrating, remembering, or making decisions?: Yes Patient able to express need for assistance with ADLs?: No Does the patient have difficulty dressing or bathing?: Yes Independently performs ADLs?: No Communication: Needs assistance Is this a change from baseline?: Change from baseline, expected to last <3 days Dressing (OT): Needs assistance Is this a change from baseline?: Change from baseline, expected to last <3days Grooming: Needs assistance Is this a change from baseline?: Change from baseline, expected to last <3 days Feeding: Needs assistance Is this a change from baseline?: Change from baseline, expected to last <3 days Bathing: Needs assistance Is this a change from baseline?: Change from baseline, expected to last <3 days Toileting: Needs assistance Is this a change from baseline?: Change from baseline, expected to last <3 days In/Out Bed: Needs assistance Is this a change from baseline?: Change from baseline, expected to last <3 days Walks in Home: Needs assistance Is this a change from  baseline?: Change from baseline, expected to last <3 days Does the patient have difficulty walking or climbing stairs?: No Weakness of Legs: Both Weakness of Arms/Hands: Both  Permission Sought/Granted   Permission granted to share information with : Yes, Verbal Permission Granted  Share Information with NAME: sister           Emotional Assessment Appearance:: Appears stated  age Attitude/Demeanor/Rapport: Engaged Affect (typically observed): Accepting Orientation: : Oriented to Self, Oriented to Place, Oriented to  Time, Oriented to Situation Alcohol / Substance Use: Not Applicable Psych Involvement: No (comment)  Admission diagnosis:  Brain mass [G93.89] Altered mental status, unspecified altered mental status type [R41.82] Patient Active Problem List   Diagnosis Date Noted   Breast mass, right 07/25/2021   Mass of upper lobe of left lung 07/25/2021   Brain masses 07/24/2021   Overweight 07/07/2021   Osteopenia 01/12/2021   History of breast cancer 03/05/2020   Primary hypertension 06/26/2018   Iron deficiency anemia 06/26/2018   Mixed hyperlipidemia 06/26/2018   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

## 2021-07-28 ENCOUNTER — Telehealth: Payer: Self-pay | Admitting: Hematology and Oncology

## 2021-07-28 DIAGNOSIS — G9389 Other specified disorders of brain: Secondary | ICD-10-CM | POA: Diagnosis not present

## 2021-07-28 NOTE — H&P (View-Only) (Signed)
NAME:  Jennifer Cowan, MRN:  [id number], DOB:  [birth year], LOS: 4 ADMISSION DATE:  07/24/2021, CONSULTATION DATE:  7/12 REFERRING MD:  Dr. Dara Lords, CHIEF COMPLAINT:  Lung mass   History of Present Illness:  Patient is a 76 yo F w/ pertinent PMH of HTN, HLD, L breast cancer post mastectomy 1999 present to [facility] ED on 7/8 w/ AMS.  Has been having functional decline over the past few months. Mammogram on 05/2021 showing calcifications in R breast. Has not underwent biopsy yet. Patient admitted to [facility] on 7/8. CT head showing 2 large brain lesions/masses w/ surrounding edema w/ regional mass effect. MRI showing intracranial masses w/ 2 large lesions from L frontal and R temporal obes w/ severe vasogenic edema; B/l mass effect noted. Started on decadron and NSG consulted. GOC had w/ family and they are agreeable to radiation to brain. Palliative following. IR performed R breast biopsy 07/26/2021. CT chest showing 3.6 cm necrotic mass located LUL. Concern for metastasis. PCCM consulted for possible bronchoscopy.  Nonsmoker, MMRC 0, no hemoptysis, chest wall pain.  Pertinent  Medical History   Past Medical History:  Diagnosis Date   Anemia    Arthritis    Breast cancer ([place])    Cancer ([place]) 1999   Breast Cancer   Hyperlipidemia    Hypertension    Osteopenia 01/12/2021    Significant Hospital Events: Including procedures, antibiotic start and stop dates in addition to other pertinent events   7/8 admitted AMS 7/10 breast mass biopsy by IR 7/12 pccm consulted for possible bronch for lung mass  Interim History / Subjective:  Sitting in chair, no distress.  Objective   Blood pressure 127/76, pulse 70, temperature 98.1 F (36.7 C), temperature source Oral, resp. rate 17, height 5' 7.99" (1.727 m), weight 84.2 kg, SpO2 97 %.        Intake/Output Summary (Last 24 hours) at 07/28/2021 1229 Last data filed at 07/28/2021 0841 Gross per 24 hour  Intake 490 ml  Output --  Net 490 ml   Filed  Weights   07/25/21 0500 07/27/21 0442 07/28/21 0424  Weight: 88.7 kg 86.6 kg 84.2 kg    Examination: General: pleasant elderly woman sitting in chair HENT: MMM, trachea midline Lungs: lungs clear, no wheezing or accessory muscle use Cardiovascular: RRR, ext warm Abdomen: Soft, +BS Extremities: no edema Neuro: moving all 4 ext to command Psych: showing fair insight, apparently this is improved from admit  Resolved Hospital Problem list     Assessment & Plan:  Mass of Upper lobe of left lung Hilar/mediastinal adenopathy Brain masses- primary unclear at present, possible 2 synchronous processes Breast mass, Right- initial stains c/w IDC Physical deconditioning HTN/HLD  - NPO midnight tomorrow for bronch Friday morning with Dr Lamonte Sakai, EBUS +/- ENB - Should be okay for DC after with close medonc/radonc f/u - Family and patient updated  Best Practice (right click and "Reselect all SmartList Selections" daily)   Per primary  Labs   CBC: Recent Labs  Lab 07/24/21 1242 07/25/21 0111  WBC 5.3 6.7  NEUTROABS 3.6  --   HGB 14.5 14.6  HCT 45.0 44.6  MCV 90.0 89.0  PLT 450* 174    Basic Metabolic Panel: Recent Labs  Lab 07/24/21 1242 07/25/21 0111  NA 139 140  K 3.6 3.5  CL 108 104  CO2 24 22  GLUCOSE 115* 154*  BUN 9 9  CREATININE 0.78 0.66  CALCIUM 9.4 9.7   GFR: Estimated Creatinine Clearance:  69.1 mL/min (by C-G formula based on SCr of 0.66 mg/dL). Recent Labs  Lab 07/24/21 1242 07/24/21 1243 07/25/21 0111  WBC 5.3  --  6.7  LATICACIDVEN  --  1.5  --     Liver Function Tests: Recent Labs  Lab 07/24/21 1242  AST 19  ALT 18  ALKPHOS 71  BILITOT 0.6  PROT 7.0  ALBUMIN 3.6   No results for input(s): "LIPASE", "AMYLASE" in the last 168 hours. Recent Labs  Lab 07/24/21 1243  AMMONIA 36*    ABG No results found for: "PHART", "PCO2ART", "PO2ART", "HCO3", "TCO2", "ACIDBASEDEF", "O2SAT"   Coagulation Profile: No results for input(s): "INR",  "PROTIME" in the last 168 hours.  Cardiac Enzymes: No results for input(s): "CKTOTAL", "CKMB", "CKMBINDEX", "TROPONINI" in the last 168 hours.  HbA1C: Hgb A1c MFr Bld  Date/Time Value Ref Range Status  07/07/2021 03:38 PM 5.9 (H) 4.8 - 5.6 % Final    Comment:             Prediabetes: 5.7 - 6.4          Diabetes: >6.4          Glycemic control for adults with diabetes: <7.0     CBG: No results for input(s): "GLUCAP" in the last 168 hours.  Review of Systems:    Positive Symptoms in bold:  Constitutional fevers, chills, weight loss, fatigue, anorexia, malaise  Eyes decreased vision, double vision, eye irritation  Ears, Nose, Mouth, Throat sore throat, trouble swallowing, sinus congestion  Cardiovascular chest pain, paroxysmal nocturnal dyspnea, lower ext edema, palpitations   Respiratory SOB, cough, DOE, hemoptysis, wheezing  Gastrointestinal nausea, vomiting, diarrhea  Genitourinary burning with urination, trouble urinating  Musculoskeletal joint aches, joint swelling, back pain  Integumentary  rashes, skin lesions  Neurological focal weakness, focal numbness, trouble speaking, headaches  Psychiatric depression, anxiety, confusion  Endocrine polyuria, polydipsia, cold intolerance, heat intolerance  Hematologic abnormal bruising, abnormal bleeding, unexplained nose bleeds  Allergic/Immunologic recurrent infections, hives, swollen lymph nodes     Past Medical History:  She,  has a past medical history of Anemia, Arthritis, Breast cancer ([place]), Cancer ([place]) (1999), Hyperlipidemia, Hypertension, and Osteopenia (01/12/2021).   Surgical History:   Past Surgical History:  Procedure Laterality Date   BREAST SURGERY  1999   Masectomy- left   CYSTECTOMY     cyst removed off top of head   MASTECTOMY       Social History:   reports that she has never smoked. She has never used smokeless tobacco. She reports that she does not drink alcohol and does not use drugs.   Family  History:  Her family history includes Arthritis in her mother; Breast cancer in her sister; Cancer in her sister; Diabetes in her brother and sister; Hyperlipidemia in her sister; Hypertension in her brother and sister; Stroke in her sister.   Allergies No Known Allergies   Home Medications  Prior to Admission medications   Medication Sig Start Date End Date Taking? Authorizing Provider  acetaminophen (TYLENOL) 500 MG tablet Take 1,000 mg by mouth every 6 (six) hours as needed for moderate pain.   Yes [provider]  amLODipine (NORVASC) 10 MG tablet TAKE 1 TABLET BY MOUTH DAILY Patient taking differently: Take 10 mg by mouth daily. 04/19/21  Yes [name], FNP  benazepril (LOTENSIN) 10 MG tablet TAKE 1 TABLET BY MOUTH DAILY Patient taking differently: Take 10 mg by mouth daily. 04/19/21  Yes [name], FNP  Cholecalciferol (  VITAMIN D3) 125 MCG (5000 UT) CAPS Take 5,000 Units by mouth daily.   Yes [provider]  meloxicam (MOBIC) 7.5 MG tablet TAKE 1 TABLET BY MOUTH DAILY Patient taking differently: Take 7.5 mg by mouth daily. 04/19/21  Yes [name], FNP  simvastatin (ZOCOR) 40 MG tablet TAKE 1 TABLET BY MOUTH DAILY Patient taking differently: Take 40 mg by mouth daily at 6 PM. 04/19/21  Yes [name], FNP  trolamine salicylate (ASPERCREME) 10 % cream Apply 1 Application topically daily as needed for muscle pain.   Yes [provider]  ezetimibe (ZETIA) 10 MG tablet Take 1 tablet (10 mg total) by mouth daily. Patient not taking: Reported on 07/24/2021 07/08/21   [name], [place] [facility] 07/28/2021, 12:29 PM  Please see [email or web address] for pager details.  From 7A-7P if no response, please call [phone]. After hours, please call ELink [phone].

## 2021-07-28 NOTE — Plan of Care (Signed)
Pt alert, intermittent confusion/forgetfulness, delayed responses, no c/o pain overnight, no acute overnight events   Problem: Education: Goal: Knowledge of General Education information will improve Description: Including pain rating scale, medication(s)/side effects and non-pharmacologic comfort measures Outcome: Progressing   Problem: Health Behavior/Discharge Planning: Goal: Ability to manage health-related needs will improve Outcome: Progressing   Problem: Clinical Measurements: Goal: Ability to maintain clinical measurements within normal limits will improve Outcome: Progressing Goal: Will remain free from infection Outcome: Progressing Goal: Diagnostic test results will improve Outcome: Progressing Goal: Respiratory complications will improve Outcome: Progressing Goal: Cardiovascular complication will be avoided Outcome: Progressing   Problem: Activity: Goal: Risk for activity intolerance will decrease Outcome: Progressing   Problem: Nutrition: Goal: Adequate nutrition will be maintained Outcome: Progressing   Problem: Coping: Goal: Level of anxiety will decrease Outcome: Progressing   Problem: Elimination: Goal: Will not experience complications related to bowel motility Outcome: Progressing Goal: Will not experience complications related to urinary retention Outcome: Progressing   Problem: Pain Managment: Goal: General experience of comfort will improve Outcome: Progressing   Problem: Safety: Goal: Ability to remain free from injury will improve Outcome: Progressing   Problem: Skin Integrity: Goal: Risk for impaired skin integrity will decrease Outcome: Progressing

## 2021-07-28 NOTE — Telephone Encounter (Signed)
Scheduled appointment per provider. Left message with appointment details. Also patient is in the hospital and will receive appointment details on discharge papers.

## 2021-07-28 NOTE — Consult Note (Addendum)
NAME:  Jennifer Cowan, MRN:  [id number], DOB:  04-Jan-1946, LOS: 4 ADMISSION DATE:  07/24/2021, CONSULTATION DATE:  7/12 REFERRING MD:  Dr. Dara Lords, CHIEF COMPLAINT:  Lung mass   History of Present Illness:  Patient is a 76 yo F w/ pertinent PMH of HTN, HLD, L breast cancer post mastectomy 1999 present to [facility] ED on 7/8 w/ AMS.  Has been having functional decline over the past few months. Mammogram on 05/2021 showing calcifications in R breast. Has not underwent biopsy yet. Patient admitted to [facility] on 7/8. CT head showing 2 large brain lesions/masses w/ surrounding edema w/ regional mass effect. MRI showing intracranial masses w/ 2 large lesions from L frontal and R temporal obes w/ severe vasogenic edema; B/l mass effect noted. Started on decadron and NSG consulted. GOC had w/ family and they are agreeable to radiation to brain. Palliative following. IR performed R breast biopsy 07/26/2021. CT chest showing 3.6 cm necrotic mass located LUL. Concern for metastasis. PCCM consulted for possible bronchoscopy.  Nonsmoker, MMRC 0, no hemoptysis, chest wall pain.  Pertinent  Medical History   Past Medical History:  Diagnosis Date   Anemia    Arthritis    Breast cancer ([place])    Cancer ([place]) 1999   Breast Cancer   Hyperlipidemia    Hypertension    Osteopenia 01/12/2021    Significant Hospital Events: Including procedures, antibiotic start and stop dates in addition to other pertinent events   7/8 admitted AMS 7/10 breast mass biopsy by IR 7/12 pccm consulted for possible bronch for lung mass  Interim History / Subjective:  Sitting in chair, no distress.  Objective   Blood pressure 127/76, pulse 70, temperature 98.1 F (36.7 C), temperature source Oral, resp. rate 17, height 5' 7.99" (1.727 m), weight 84.2 kg, SpO2 97 %.        Intake/Output Summary (Last 24 hours) at 07/28/2021 1229 Last data filed at 07/28/2021 0841 Gross per 24 hour  Intake 490 ml  Output --  Net 490 ml   Filed  Weights   07/25/21 0500 07/27/21 0442 07/28/21 0424  Weight: 88.7 kg 86.6 kg 84.2 kg    Examination: General: pleasant elderly woman sitting in chair HENT: MMM, trachea midline Lungs: lungs clear, no wheezing or accessory muscle use Cardiovascular: RRR, ext warm Abdomen: Soft, +BS Extremities: no edema Neuro: moving all 4 ext to command Psych: showing fair insight, apparently this is improved from admit  Resolved Hospital Problem list     Assessment & Plan:  Mass of Upper lobe of left lung Hilar/mediastinal adenopathy Brain masses- primary unclear at present, possible 2 synchronous processes Breast mass, Right- initial stains c/w IDC Physical deconditioning HTN/HLD  - NPO midnight tomorrow for bronch Friday morning with Dr Lamonte Sakai, EBUS +/- ENB - Should be okay for DC after with close medonc/radonc f/u - Family and patient updated  Best Practice (right click and "Reselect all SmartList Selections" daily)   Per primary  Labs   CBC: Recent Labs  Lab 07/24/21 1242 07/25/21 0111  WBC 5.3 6.7  NEUTROABS 3.6  --   HGB 14.5 14.6  HCT 45.0 44.6  MCV 90.0 89.0  PLT 450* 008    Basic Metabolic Panel: Recent Labs  Lab 07/24/21 1242 07/25/21 0111  NA 139 140  K 3.6 3.5  CL 108 104  CO2 24 22  GLUCOSE 115* 154*  BUN 9 9  CREATININE 0.78 0.66  CALCIUM 9.4 9.7   GFR: Estimated Creatinine Clearance:  69.1 mL/min (by C-G formula based on SCr of 0.66 mg/dL). Recent Labs  Lab 07/24/21 1242 07/24/21 1243 07/25/21 0111  WBC 5.3  --  6.7  LATICACIDVEN  --  1.5  --     Liver Function Tests: Recent Labs  Lab 07/24/21 1242  AST 19  ALT 18  ALKPHOS 71  BILITOT 0.6  PROT 7.0  ALBUMIN 3.6   No results for input(s): "LIPASE", "AMYLASE" in the last 168 hours. Recent Labs  Lab 07/24/21 1243  AMMONIA 36*    ABG No results found for: "PHART", "PCO2ART", "PO2ART", "HCO3", "TCO2", "ACIDBASEDEF", "O2SAT"   Coagulation Profile: No results for input(s): "INR",  "PROTIME" in the last 168 hours.  Cardiac Enzymes: No results for input(s): "CKTOTAL", "CKMB", "CKMBINDEX", "TROPONINI" in the last 168 hours.  HbA1C: Hgb A1c MFr Bld  Date/Time Value Ref Range Status  07/07/2021 03:38 PM 5.9 (H) 4.8 - 5.6 % Final    Comment:             Prediabetes: 5.7 - 6.4          Diabetes: >6.4          Glycemic control for adults with diabetes: <7.0     CBG: No results for input(s): "GLUCAP" in the last 168 hours.  Review of Systems:    Positive Symptoms in bold:  Constitutional fevers, chills, weight loss, fatigue, anorexia, malaise  Eyes decreased vision, double vision, eye irritation  Ears, Nose, Mouth, Throat sore throat, trouble swallowing, sinus congestion  Cardiovascular chest pain, paroxysmal nocturnal dyspnea, lower ext edema, palpitations   Respiratory SOB, cough, DOE, hemoptysis, wheezing  Gastrointestinal nausea, vomiting, diarrhea  Genitourinary burning with urination, trouble urinating  Musculoskeletal joint aches, joint swelling, back pain  Integumentary  rashes, skin lesions  Neurological focal weakness, focal numbness, trouble speaking, headaches  Psychiatric depression, anxiety, confusion  Endocrine polyuria, polydipsia, cold intolerance, heat intolerance  Hematologic abnormal bruising, abnormal bleeding, unexplained nose bleeds  Allergic/Immunologic recurrent infections, hives, swollen lymph nodes     Past Medical History:  She,  has a past medical history of Anemia, Arthritis, Breast cancer ([place]), Cancer ([place]) (1999), Hyperlipidemia, Hypertension, and Osteopenia (01/12/2021).   Surgical History:   Past Surgical History:  Procedure Laterality Date   BREAST SURGERY  1999   Masectomy- left   CYSTECTOMY     cyst removed off top of head   MASTECTOMY       Social History:   reports that she has never smoked. She has never used smokeless tobacco. She reports that she does not drink alcohol and does not use drugs.   Family  History:  Her family history includes Arthritis in her mother; Breast cancer in her sister; Cancer in her sister; Diabetes in her brother and sister; Hyperlipidemia in her sister; Hypertension in her brother and sister; Stroke in her sister.   Allergies No Known Allergies   Home Medications  Prior to Admission medications   Medication Sig Start Date End Date Taking? Authorizing Provider  acetaminophen (TYLENOL) 500 MG tablet Take 1,000 mg by mouth every 6 (six) hours as needed for moderate pain.   Yes [provider]  amLODipine (NORVASC) 10 MG tablet TAKE 1 TABLET BY MOUTH DAILY Patient taking differently: Take 10 mg by mouth daily. 04/19/21  Yes [name], FNP  benazepril (LOTENSIN) 10 MG tablet TAKE 1 TABLET BY MOUTH DAILY Patient taking differently: Take 10 mg by mouth daily. 04/19/21  Yes [name], FNP  Cholecalciferol (  VITAMIN D3) 125 MCG (5000 UT) CAPS Take 5,000 Units by mouth daily.   Yes [provider]  meloxicam (MOBIC) 7.5 MG tablet TAKE 1 TABLET BY MOUTH DAILY Patient taking differently: Take 7.5 mg by mouth daily. 04/19/21  Yes [name], FNP  simvastatin (ZOCOR) 40 MG tablet TAKE 1 TABLET BY MOUTH DAILY Patient taking differently: Take 40 mg by mouth daily at 6 PM. 04/19/21  Yes [name], FNP  trolamine salicylate (ASPERCREME) 10 % cream Apply 1 Application topically daily as needed for muscle pain.   Yes [provider]  ezetimibe (ZETIA) 10 MG tablet Take 1 tablet (10 mg total) by mouth daily. Patient not taking: Reported on 07/24/2021 07/08/21   [name], [facility] 07/28/2021, 12:29 PM  Please see [email or web address] for pager details.  From 7A-7P if no response, please call [phone]. After hours, please call ELink [phone].

## 2021-07-28 NOTE — Progress Notes (Signed)
Mobility Specialist Progress Note:   07/28/21 1600  Mobility  Activity Ambulated with assistance to bathroom;Ambulated with assistance in hallway  Level of Assistance Minimal assist, patient does 75% or more  Assistive Device Front wheel walker  Distance Ambulated (ft) 300 ft  Activity Response Tolerated well  $Mobility charge 1 Mobility   Pt eager for mobility session. Required minA to power up x3 times (chair, sofa, toilet), CGA during ambulation. X1 prolonged seated rest break required, pt asx throughout. Back in chair with chair alarm on.   Nelta Numbers Acute Rehab Secure Chat or Office Phone: [phone]

## 2021-07-28 NOTE — Progress Notes (Signed)
I called and spoke with [name], pt's next of kin and let her know we were reaching out to pulmonary medicine about requesting bronchoscopy. 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 Prognostic panel is pending, and Ms. Gilford Rile states she did not know Ms. Kadar to have had any treatment other than mastectomy for her history of left breast cancer, but she was also a young child when this happened, so it's possible she may have taken AE therapy. She reports Ms. 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 It sounds as though she was able to consent as well for her care yesterday.

## 2021-07-28 NOTE — Progress Notes (Signed)
Progress Note Patient: Jennifer Cowan NLZ:[id number] DOB: [birth year] DOA: 07/24/2021  DOS: the patient was seen and examined on 07/28/2021  Brief hospital course: JALYNE BRODZINSKI is a 76 y.o. female with medical history significant for HTN, HLD, left breast cancer s/p mastectomy in 1999 who presented with progressive functional decline (confusion, diminished ambulation). She underwent mammogram in May which was concerning for a right breast mass with calcifications.  She was recommended for biopsy but has not yet undergone prior to admission. She underwent work-up regarding her confusion.  Initial CT head showed 2 large brain lesions/masses with surrounding edema and regional mass effect.  She then underwent MRI brain for further evaluation.  This confirmed multiple intracranial masses, including 2 large lesions involving the left frontal and right temporal lobes with severe surrounding vasogenic edema.  Bilateral mass effect noted without herniation.  She was started on Decadron and neurosurgery was consulted. Extensive goals of care discussions were held the following morning with her family.  They are still discussing amongst themselves but they are amenable for at least consideration of brain radiation at this time. Palliative care also consulted along with medical oncology and radiation oncology.  IR consulted for discussion regarding right breast mass biopsy. She was able to undergo R breast biopsy on 07/26/21.  Biopsy came back positive for infiltrating ductal carcinoma.  Daughter has been informed about the results by radiation oncology. Assessment and Plan: Infiltrating ductal carcinoma of the right breast. Noted on the mammogram May 2023. Biopsy positive for IDC. Radiation oncology as well as medical oncology both following. We will follow-up on recommendation.  Brain mass.  Mass effect due to vasogenic edema. Acute confusional status. Presents with confusion work-up in the ER shows multiple  brain mass, 2 large. Right breast mass biopsy positive for infiltrative ductal carcinoma. Patient also appears to have a lung mass which could be a separate primary or metastatic lesion. Patient's radiation therapy options for the brain mass changes significantly based on results of the biopsy and therefore pulmonary consulted to pursue lung biopsy.  Mass of the left upper lobe lung. Pulmonary consulted. Bronchoscopy scheduled on this Friday. Will monitor recommendation on results.  Deconditioning. PT OT recommends home with home health.  HLD. Continuing statin.  Subjective: No nausea no vomiting no fever no chills.  No other acute complaint.  Physical Exam: Vitals:   07/28/21 0424 07/28/21 0748 07/28/21 1459 07/28/21 1617  BP:  127/76 131/69 124/64  Pulse:  70 77 85  Resp:  17 17 16   Temp:  98.1 F (36.7 C) 97.8 F (36.6 C) 98.8 F (37.1 C)  TempSrc:  Oral Oral   SpO2:  97% 98% 96%  Weight: 84.2 kg     Height:       General: Appear in mild distress; no visible Abnormal Neck Mass Or lumps, Conjunctiva normal Cardiovascular: S1 and S2 Present, no Murmur, Respiratory: good respiratory effort, Bilateral Air entry present and CTA, no Crackles, no wheezes Abdomen: Bowel Sound present, Non tender  Extremities: no Pedal edema Neurology: alert and oriented to place and person Gait not checked due to patient safety concerns   Data Reviewed: I have Reviewed nursing notes, Vitals, and Lab results since pt's last encounter. Pertinent lab results CBC and BMP I have ordered test including CBC and BMP I have discussed pt's care plan and test results with pulmonary and radiation oncology.   Family Communication: Sister at bedside  Disposition: Status is: Inpatient Remains inpatient appropriate because: Need for bronchoscopy  and biopsy as well as radiation.  Author: Berle Mull, MD 07/28/2021 6:22 PM  Please look on [email or web address] to find out who is on call.

## 2021-07-29 NOTE — TOC Progression Note (Addendum)
Transition of Care [facility]) - Progression Note    Patient Details  Name: Jennifer Cowan MRN: [id number] Date of Birth: 07-20-1945  Transition of Care [place]) CM/SW Contact  Mearle Drew, Edson Snowball, RN Phone Number: 07/29/2021, 10:40 AM  Clinical Narrative:     Spoke to patient and sister at bedside again. Plan is at discharge to go home with home health PT. They are aware HHPT usually twice a week for hour at a time.    Left message with Tommi Rumps with [facility], await return call. Tommi Rumps with [facility] accepted for [place] for walker, 3 in 1 and wheel chair   Expected Discharge Plan:  (waiting on treatment plan) Barriers to Discharge: Continued Medical Work up  Expected Discharge Plan and Services Expected Discharge Plan:  (waiting on treatment plan)   Discharge Planning Services: CM Consult Post Acute Care Choice: [place] arrangements for the past 2 months: Single Family Home                 DME Arranged: 3-N-1, Walker rolling, Wheelchair manual         HH Arranged: PT           Social Determinants of Health (SDOH) Interventions    Readmission Risk Interventions     No data to display

## 2021-07-29 NOTE — Anesthesia Preprocedure Evaluation (Addendum)
Anesthesia Evaluation  Patient identified by MRN, date of birth, ID band Patient confused    Reviewed: NPO status , Patient's Chart, lab work & pertinent test results  Airway Mallampati: II  TM Distance: >3 FB Neck ROM: Full    Dental  (+) Upper Dentures,    Pulmonary  Lung mass   Pulmonary exam normal breath sounds clear to auscultation       Cardiovascular Exercise Tolerance: Poor hypertension, Normal cardiovascular exam Rhythm:Regular Rate:Normal     Neuro/Psych 2 large brain masses with vasogenic edema negative psych ROS   GI/Hepatic negative GI ROS, Neg liver ROS,   Endo/Other  hyperlipidemia  Renal/GU negative Renal ROS  negative genitourinary   Musculoskeletal  (+) Arthritis , Osteoarthritis,    Abdominal   Peds negative pediatric ROS (+)  Hematology  (+) Blood dyscrasia, anemia ,   Anesthesia Other Findings H/o Breast cancer  Reproductive/Obstetrics                           Anesthesia Physical Anesthesia Plan  ASA: 4  Anesthesia Plan: General   Post-op Pain Management: Tylenol PO (pre-op)*   Induction: Intravenous  PONV Risk Score and Plan: 3  Airway Management Planned: Oral ETT  Additional Equipment: None  Intra-op Plan:   Post-operative Plan: Extubation in OR  Informed Consent: I have reviewed the patients History and Physical, chart, labs and discussed the procedure including the risks, benefits and alternatives for the proposed anesthesia with the patient or authorized representative who has indicated his/her understanding and acceptance.     Consent reviewed with POA  Plan Discussed with: Anesthesiologist and CRNA  Anesthesia Plan Comments: (Talked with her niece who is the decision-maker and reviewed the plan. GETA. Existing IV access. Norton Blizzard, MD  )      Anesthesia Quick Evaluation

## 2021-07-29 NOTE — Progress Notes (Signed)
Physical Therapy Treatment Patient Details Name: Jennifer Cowan MRN: [id number] DOB: May 04, 1945 Today's Date: 07/29/2021   History of Present Illness Jennifer Cowan admitted 7/8 is a 76 y.o. female  who presented with progressive functional decline (confusion, diminished ambulation); Imaging showing brain masses and a breast mass and lung mass; with medical history significant for HTN, HLD, left breast cancer s/p mastectomy in 1999    PT Comments    Patient is progressing well towards physical therapy goals, ambulating up to 95 feet twice with RW at min guard level for safety. Tolerated therex for LE strengthening. Patient declines trying stairs today as she is tired from just finishing OT session, but agreeable to attempt tomorrow. Sister present during Tx and supportive. Patient will continue to benefit from skilled physical therapy services to further improve independence with functional mobility.     Recommendations for follow up therapy are one component of a multi-disciplinary discharge planning process, led by the attending physician.  Recommendations may be updated based on patient status, additional functional criteria and insurance authorization.  Follow Up Recommendations  Home health PT     Assistance Recommended at Discharge Set up Supervision/Assistance  Patient can return home with the following Assistance with cooking/housework;Help with stairs or ramp for entrance;Assist for transportation   Equipment Recommendations  Rolling walker (2 wheels);Wheelchair (measurements PT);Wheelchair cushion (measurements PT);BSC/3in1;Other (comment)    Recommendations for Other Services       Precautions / Restrictions Precautions Precautions: Fall Restrictions Weight Bearing Restrictions: No     Mobility  Bed Mobility Overal bed mobility: Needs Assistance             General bed mobility comments: Walking with staff when PT entered room    Transfers Overall transfer  level: Needs assistance Equipment used: Rolling walker (2 wheels) Transfers: Sit to/from Stand Sit to Stand: Supervision           General transfer comment: Cues for controlled descent, square walker to surface, and reach back prior to sitting, performed on 2 different surfaces. VC to rise from seat without arm rests. Good use of momentum to rise.    Ambulation/Gait Ambulation/Gait assistance: Min guard Gait Distance (Feet): 95 Feet (x2) Assistive device: Rolling walker (2 wheels) Gait Pattern/deviations: Step-through pattern, Decreased stride length, Drifts right/left Gait velocity: slow     General Gait Details: Mild instability without overt LOB. Intermittent drifting of RW, able to self correct 75% of the time, and responds to [facility] when getting too close to objects in hallway.   Stairs Stairs:  (declines today.)           Wheelchair Mobility    Modified Rankin (Stroke Patients Only)       Balance Overall balance assessment: Needs assistance Sitting-balance support: Feet supported Sitting balance-Leahy Scale: Good     Standing balance support: No upper extremity supported, During functional activity Standing balance-Leahy Scale: Fair                              Cognition Arousal/Alertness: Awake/alert Behavior During Therapy: Flat affect Overall Cognitive Status: Impaired/Different from baseline Area of Impairment: Problem solving, Memory, Following commands, Awareness, Safety/judgement, Orientation                 Orientation Level: Disoriented to, Place, Time (Thinks it is september, recalls years with cues, thought she was at [employer] long. Reoriented.)   Memory: Decreased short-term memory Following Commands: Follows one step  commands consistently Safety/Judgement: Decreased awareness of safety Awareness: Emergent Problem Solving: Slow processing, Decreased initiation, Difficulty sequencing, Requires tactile cues General Comments:  slow to answer questions but noted memory improvements from prior sessions. slower processing and benefits from sequencing cues for ADLs d/t repetition of some tasks at times. decreased safety awareness with DME use as pt noted to leave RW outside of bathroom during session. Recalled room number.        Exercises General Exercises - Lower Extremity Ankle Circles/Pumps: AROM, Both, 10 reps, Seated Long Arc Quad: Strengthening, Both, 10 reps, Seated Hip ABduction/ADduction: Strengthening, Both, 10 reps, Seated (pillow squeeze)    General Comments General comments (skin integrity, edema, etc.): Sister participated with session.      Pertinent Vitals/Pain Pain Assessment Pain Assessment: No/denies pain    Home Living                          Prior Function            PT Goals (current goals can now be found in the care plan section) Acute Rehab PT Goals Patient Stated Goal: none stated PT Goal Formulation: With patient/family Time For Goal Achievement: 08/08/21 Potential to Achieve Goals: Good Progress towards PT goals: Progressing toward goals    Frequency    Min 3X/week      PT Plan Current plan remains appropriate    Co-evaluation              AM-PAC PT "6 Clicks" Mobility   Outcome Measure  Help needed turning from your back to your side while in a flat bed without using bedrails?: None Help needed moving from lying on your back to sitting on the side of a flat bed without using bedrails?: A Little Help needed moving to and from a bed to a chair (including a wheelchair)?: A Little Help needed standing up from a chair using your arms (e.g., wheelchair or bedside chair)?: A Little Help needed to walk in hospital room?: A Little Help needed climbing 3-5 steps with a railing? : A Lot 6 Click Score: 18    End of Session Equipment Utilized During Treatment: Gait belt Activity Tolerance: Patient tolerated treatment well Patient left: in chair;with  call bell/phone within reach;with chair alarm set;with family/visitor present Nurse Communication: Mobility status PT Visit Diagnosis: Unsteadiness on feet (R26.81)     Time: 3154-0086 PT Time Calculation (min) (ACUTE ONLY): 13 min  Charges:  $Gait Training: 8-22 mins                     Candie Mile, PT    Ellouise Newer 07/29/2021, 2:11 PM

## 2021-07-29 NOTE — Progress Notes (Signed)
Occupational Therapy Treatment Patient Details Name: Jennifer Cowan MRN: [id number] DOB: January 07, 1946 Today's Date: 07/29/2021   History of present illness CORINDA AMMON is a 76 y.o. female  who presented with progressive functional decline (confusion, diminished ambulation); Imaging showing brain masses and a breast mass and lung mass; with medical history significant for HTN, HLD, left breast cancer s/p mastectomy in 1999   OT comments  Pt progressing well towards established OT goals with improving balance during mobility. Pt with continued expected cognitive deficits with intermittent cues needed to initiate/sequence ADL tasks (toileting hygiene) but improvements noted in working memory for some multi-step ADLs (washing hands at sink). Pt able to mobilize short distances without DME but may benefit from RW use for longer distances and energy conservation. Continue to rec HHOT especially with new cognitive impairments.   Recommendations for follow up therapy are one component of a multi-disciplinary discharge planning process, led by the attending physician.  Recommendations may be updated based on patient status, additional functional criteria and insurance authorization.    Follow Up Recommendations  Home health OT    Assistance Recommended at Discharge Frequent or constant Supervision/Assistance  Patient can return home with the following  A little help with bathing/dressing/bathroom;Assistance with cooking/housework;Direct supervision/assist for medications management;Direct supervision/assist for financial management;Assist for transportation;Help with stairs or ramp for entrance   Equipment Recommendations  BSC/3in1;Other (comment) (RW)    Recommendations for Other Services      Precautions / Restrictions Precautions Precautions: Fall Restrictions Weight Bearing Restrictions: No       Mobility Bed Mobility Overal bed mobility: Needs Assistance Bed Mobility: Supine to Sit      Supine to sit: Supervision, HOB elevated          Transfers Overall transfer level: Needs assistance Equipment used: Rolling walker (2 wheels) Transfers: Sit to/from Stand Sit to Stand: Supervision           General transfer comment: increased time/effort and cues for problem solving hand placement but pt able to stand w/o assist using RW from bedside, recliner     Balance Overall balance assessment: Needs assistance Sitting-balance support: Feet supported Sitting balance-Leahy Scale: Good     Standing balance support: No upper extremity supported, During functional activity Standing balance-Leahy Scale: Fair                             ADL either performed or assessed with clinical judgement   ADL Overall ADL's : Needs assistance/impaired     Grooming: Standing;Wash/dry hands;Set up Grooming Details (indicate cue type and reason): able to sequence this multi step task without cues, no LOB without DME or UE support     Lower Body Bathing: Min guard;Sit to/from stand Lower Body Bathing Details (indicate cue type and reason): able to bathe peri region standing at sink with minor cues/assist for clothing mgmt but not overt LOB or safety concerns Upper Body Dressing : Supervision/safety;Sitting Upper Body Dressing Details (indicate cue type and reason): donning gown around back, cues for hand placement Lower Body Dressing: Minimal assistance;Sit to/from stand Lower Body Dressing Details (indicate cue type and reason): able to bend to reach towards socks easily Toilet Transfer: Min guard;Ambulation;Regular Toilet;Grab bars Toilet Transfer Details (indicate cue type and reason): left RW outside of bathroom doorway though no overt unsteadiness and handheld assist not required this time. able to pull to stand with grab bars Toileting- Clothing Manipulation and Hygiene: Minimal assistance;Sit to/from stand;Sitting/lateral  lean Toileting - Clothing Manipulation  Details (indicate cue type and reason): assist for clothing mgmt, some cues to initiate hygiene and sequence this task     Functional mobility during ADLs: Min guard;Rolling walker (2 wheels) General ADL Comments: improving balance functionally and cognition with some impairments though due to brain mets, anticipate cognition will remain an issue    Extremity/Trunk Assessment Upper Extremity Assessment Upper Extremity Assessment: Overall WFL for tasks assessed   Lower Extremity Assessment Lower Extremity Assessment: Defer to PT evaluation        Vision   Vision Assessment?: No apparent visual deficits   Perception     Praxis      Cognition Arousal/Alertness: Awake/alert Behavior During Therapy: Flat affect Overall Cognitive Status: Impaired/Different from baseline Area of Impairment: Problem solving, Memory, Following commands, Awareness, Safety/judgement                     Memory: Decreased short-term memory Following Commands: Follows one step commands consistently Safety/Judgement: Decreased awareness of safety Awareness: Emergent Problem Solving: Slow processing, Decreased initiation, Difficulty sequencing, Requires tactile cues General Comments: slow to answer questions but noted memory improvements from prior sessions. slower processing and benefits from sequencing cues for ADLs d/t repetition of some tasks at times. decreased safety awareness with DME use as pt noted to leave RW outside of bathroom during session        Exercises      Shoulder Instructions       General Comments Sister entering at end of session    Pertinent Vitals/ Pain       Pain Assessment Pain Assessment: No/denies pain  Home Living                                          Prior Functioning/Environment              Frequency  Min 2X/week        Progress Toward Goals  OT Goals(current goals can now be found in the care plan section)  Progress  towards OT goals: Progressing toward goals  Acute Rehab OT Goals Patient Stated Goal: walk in hallway OT Goal Formulation: With patient/family Time For Goal Achievement: 08/09/21 Potential to Achieve Goals: Good ADL Goals Pt Will Perform Lower Body Bathing: with supervision;sit to/from stand;sitting/lateral leans Pt Will Perform Lower Body Dressing: with supervision;sitting/lateral leans;sit to/from stand Pt Will Transfer to Toilet: with set-up;ambulating  Plan Discharge plan remains appropriate    Co-evaluation                 AM-PAC OT "6 Clicks" Daily Activity     Outcome Measure   Help from another person eating meals?: None Help from another person taking care of personal grooming?: A Little Help from another person toileting, which includes using toliet, bedpan, or urinal?: A Little Help from another person bathing (including washing, rinsing, drying)?: A Little Help from another person to put on and taking off regular upper body clothing?: A Little Help from another person to put on and taking off regular lower body clothing?: A Little 6 Click Score: 19    End of Session Equipment Utilized During Treatment: Gait belt;Rolling walker (2 wheels)  OT Visit Diagnosis: Unsteadiness on feet (R26.81);Other symptoms and signs involving cognitive function   Activity Tolerance Patient tolerated treatment well   Patient Left with family/visitor present (to ambulate  in hallway with PT)   Nurse Communication          Time: 9290-9030 OT Time Calculation (min): 21 min  Charges: OT General Charges $OT Visit: 1 Visit OT Treatments $Self Care/Home Management : 8-22 mins  Malachy Chamber, OTR/L Acute Rehab Services Office: [phone]   Layla Maw 07/29/2021, 1:26 PM

## 2021-07-30 ENCOUNTER — Inpatient Hospital Stay (HOSPITAL_COMMUNITY): Payer: Medicare Other

## 2021-07-30 ENCOUNTER — Other Ambulatory Visit: Payer: Self-pay

## 2021-07-30 ENCOUNTER — Inpatient Hospital Stay (HOSPITAL_COMMUNITY): Payer: Medicare Other | Admitting: Anesthesiology

## 2021-07-30 ENCOUNTER — Encounter (HOSPITAL_COMMUNITY)
Admission: EM | Disposition: A | Discharge status: Home / Self Care | Payer: Self-pay | Source: Home / Self Care | Attending: Internal Medicine

## 2021-07-30 ENCOUNTER — Encounter (HOSPITAL_COMMUNITY): Payer: Self-pay | Admitting: Internal Medicine

## 2021-07-30 DIAGNOSIS — R918 Other nonspecific abnormal finding of lung field: Secondary | ICD-10-CM | POA: Diagnosis not present

## 2021-07-30 DIAGNOSIS — R5381 Other malaise: Secondary | ICD-10-CM | POA: Diagnosis not present

## 2021-07-30 DIAGNOSIS — G9389 Other specified disorders of brain: Secondary | ICD-10-CM | POA: Diagnosis not present

## 2021-07-30 DIAGNOSIS — R4182 Altered mental status, unspecified: Secondary | ICD-10-CM | POA: Diagnosis not present

## 2021-07-30 DIAGNOSIS — I10 Essential (primary) hypertension: Secondary | ICD-10-CM

## 2021-07-30 DIAGNOSIS — D649 Anemia, unspecified: Secondary | ICD-10-CM

## 2021-07-30 DIAGNOSIS — E785 Hyperlipidemia, unspecified: Secondary | ICD-10-CM

## 2021-07-30 HISTORY — PX: BRONCHIAL NEEDLE ASPIRATION BIOPSY: SHX5106

## 2021-07-30 HISTORY — PX: BRONCHIAL BIOPSY: SHX5109

## 2021-07-30 HISTORY — PX: BRONCHIAL BRUSHINGS: SHX5108

## 2021-07-30 HISTORY — PX: VIDEO BRONCHOSCOPY WITH RADIAL ENDOBRONCHIAL ULTRASOUND: SHX6849

## 2021-07-30 SURGERY — BRONCHOSCOPY, WITH BIOPSY USING ELECTROMAGNETIC NAVIGATION
Anesthesia: General | Laterality: Left

## 2021-07-30 MED ORDER — ROCURONIUM BROMIDE 10 MG/ML (PF) SYRINGE
PREFILLED_SYRINGE | INTRAVENOUS | Status: DC | PRN
  Administered 2021-07-30: 60 mg via INTRAVENOUS

## 2021-07-30 MED ORDER — ONDANSETRON HCL 4 MG/2ML IJ SOLN: 4.0000 mg | Freq: Once | INTRAMUSCULAR | Status: DC | PRN

## 2021-07-30 MED ORDER — PHENYLEPHRINE 80 MCG/ML (10ML) SYRINGE FOR IV PUSH (FOR BLOOD PRESSURE SUPPORT)
PREFILLED_SYRINGE | INTRAVENOUS | Status: DC | PRN
  Administered 2021-07-30 (×4): 80 ug via INTRAVENOUS

## 2021-07-30 MED ORDER — PROPOFOL 500 MG/50ML IV EMUL
INTRAVENOUS | Status: DC | PRN
  Administered 2021-07-30: 100 ug/kg/min via INTRAVENOUS

## 2021-07-30 MED ORDER — AMISULPRIDE (ANTIEMETIC) 5 MG/2ML IV SOLN
10.0000 mg | Freq: Once | INTRAVENOUS | Status: DC | PRN
Start: 2021-07-30 — End: 2021-07-30

## 2021-07-30 MED ORDER — FENTANYL CITRATE (PF) 100 MCG/2ML IJ SOLN: 25.0000 ug | INTRAMUSCULAR | Status: DC | PRN

## 2021-07-30 MED ORDER — DEXAMETHASONE SODIUM PHOSPHATE 10 MG/ML IJ SOLN
INTRAMUSCULAR | Status: DC | PRN
  Administered 2021-07-30: 10 mg via INTRAVENOUS

## 2021-07-30 MED ORDER — SUGAMMADEX SODIUM 200 MG/2ML IV SOLN
INTRAVENOUS | Status: DC | PRN
  Administered 2021-07-30: 100 mg via INTRAVENOUS
  Administered 2021-07-30: 200 mg via INTRAVENOUS

## 2021-07-30 MED ORDER — ONDANSETRON HCL 4 MG/2ML IJ SOLN
INTRAMUSCULAR | Status: DC | PRN
  Administered 2021-07-30: 4 mg via INTRAVENOUS

## 2021-07-30 MED ORDER — LIDOCAINE 2% (20 MG/ML) 5 ML SYRINGE
INTRAMUSCULAR | Status: DC | PRN
  Administered 2021-07-30: 60 mg via INTRAVENOUS

## 2021-07-30 MED ORDER — FENTANYL CITRATE (PF) 250 MCG/5ML IJ SOLN
INTRAMUSCULAR | Status: AC
  Filled 2021-07-30: qty 5

## 2021-07-30 MED ORDER — FENTANYL CITRATE (PF) 250 MCG/5ML IJ SOLN
INTRAMUSCULAR | Status: DC | PRN
  Administered 2021-07-30: 50 ug via INTRAVENOUS

## 2021-07-30 MED ORDER — LACTATED RINGERS IV SOLN: INTRAVENOUS | Status: DC | PRN

## 2021-07-30 MED ORDER — PROPOFOL 10 MG/ML IV BOLUS
INTRAVENOUS | Status: DC | PRN
  Administered 2021-07-30: 150 mg via INTRAVENOUS

## 2021-07-30 MED ORDER — OXYCODONE HCL 5 MG/5ML PO SOLN: 5.0000 mg | Freq: Once | ORAL | Status: DC | PRN

## 2021-07-30 MED ORDER — OXYCODONE HCL 5 MG PO TABS: 5.0000 mg | ORAL_TABLET | Freq: Once | ORAL | Status: DC | PRN

## 2021-07-30 NOTE — Progress Notes (Incomplete)
Palliative Medicine Progress Note   Patient Name: Jennifer Cowan       Date: 07/30/2021 DOB: 06-12-1945  Age: 76 y.o. MRN#: [id number] Attending Physician: Lavina Hamman, MD Primary Care Physician: [name], FNP Admit Date: 07/24/2021  Reason for Consultation/Follow-up: {Reason for Consult:23484}  HPI/Patient Profile: 76 y.o. female  with past medical history of left breast cancer s/p mastectomy in 1999, hypertension, and hyperlipidemia who presented to [facility] ED on 07/24/2021 with progressive functional decline (confusion, diminished ambulation).  She underwent a screening mammogram in May which was concerning for a right breast mass with calcifications and was recommended for biopsy but has not yet undergone this. In the ED, head CT showed 2 large brain masses with surrounding edema and regional mass effect.  MRI confirmed multiple intracranial masses, including 2 large lesions involving left frontal and right temporal lobes with severe surrounding vasogenic edema.  She was started on Decadron.  Neurosurgery was consulted and recommended palliative radiation.   Patient underwent ultrasound guided right breast biopsy on 7/10.   Subjective: Chart reviewed. Note that patient underwent bronchoscopy with lung biopsies earlier today.   Objective:  Physical Exam          Vital Signs: BP (!) 163/85 (BP Location: Right Leg)   Pulse 74   Temp 97.8 F (36.6 C)   Resp 16   Ht 5' 7.99" (1.727 m)   Wt 81.8 kg   SpO2 97%   BMI 27.44 kg/m  SpO2: SpO2: 97 % O2 Device: O2 Device: Nasal Cannula O2 Flow Rate: O2 Flow Rate (L/min): 4 L/min     LBM: Last BM Date : 07/28/21     Palliative Assessment/Data: ***     Palliative Medicine Assessment & Plan   Assessment: Principal Problem:    Brain masses Active Problems:   Primary hypertension   Mixed hyperlipidemia   Infiltrating duct carcinoma (HCC)   Mass of upper lobe of left lung   Physical deconditioning    Recommendations/Plan: ***  Goals of Care and Additional Recommendations: Limitations on Scope of Treatment: {Recommended Scope and Preferences:21019}  Code Status:   Prognosis:  {Palliative Care Prognosis:23504}  Discharge Planning: {Palliative dispostion:23505}  Care plan was discussed with ***  Thank you for allowing the Palliative Medicine Team to assist in the care of this patient.   ***  Lavena Bullion, NP   Please contact Palliative Medicine Team phone at [phone] for questions and concerns.  For individual providers, please see [facility].

## 2021-07-30 NOTE — Plan of Care (Signed)

## 2021-07-30 NOTE — Anesthesia Postprocedure Evaluation (Signed)
Anesthesia Post Note  Patient: Jennifer Cowan  Procedure(s) Performed: ROBOTIC ASSISTED NAVIGATIONAL BRONCHOSCOPY (Left) BRONCHIAL NEEDLE ASPIRATION BIOPSIES BRONCHIAL BRUSHINGS BRONCHIAL BIOPSIES VIDEO BRONCHOSCOPY WITH RADIAL ENDOBRONCHIAL ULTRASOUND     Patient location during evaluation: PACU Anesthesia Type: General Level of consciousness: awake Pain management: pain level controlled Vital Signs Assessment: post-procedure vital signs reviewed and stable Respiratory status: spontaneous breathing and respiratory function stable Cardiovascular status: stable Postop Assessment: no apparent nausea or vomiting Anesthetic complications: no   No notable events documented.  Last Vitals:  Vitals:   07/30/21 0900 07/30/21 0915  BP: (!) 155/85 (!) 163/85  Pulse: 77 74  Resp: (!) 24 16  Temp:  36.6 C  SpO2: 100% 97%    Last Pain:  Vitals:   07/30/21 0915  TempSrc:   PainSc: 0-No pain                 Merlinda Frederick

## 2021-07-30 NOTE — Progress Notes (Signed)
Physical Therapy Treatment Patient Details Name: FEDORA KNISELY MRN: [id number] DOB: September 01, 1945 Today's Date: 07/30/2021   History of Present Illness JOHANNAH ROZAS admitted 7/8 is a 76 y.o. female  who presented with progressive functional decline (confusion, diminished ambulation); Imaging showing brain masses and a breast mass and lung mass; with medical history significant for HTN, HLD, left breast cancer s/p mastectomy in 1999    PT Comments    Patient seen for further progression with mobility and continues to make headway  towards physical therapy goals, ambulating up to 95 feet x2. Supervision with transfers today with verbal cues to facilitate. Family present and observed. No LOB, some difficulty with processing, slow but steady with RW for support. SpO2 94% on room air throughout therapy including while ambulating. Will continue to follow until d/c.    Recommendations for follow up therapy are one component of a multi-disciplinary discharge planning process, led by the attending physician.  Recommendations may be updated based on patient status, additional functional criteria and insurance authorization.  Follow Up Recommendations  Home health PT     Assistance Recommended at Discharge Set up Supervision/Assistance  Patient can return home with the following Assistance with cooking/housework;Help with stairs or ramp for entrance;Assist for transportation   Equipment Recommendations  Rolling walker (2 wheels);Wheelchair (measurements PT);Wheelchair cushion (measurements PT);BSC/3in1    Recommendations for Other Services       Precautions / Restrictions Precautions Precautions: Fall Restrictions Weight Bearing Restrictions: No     Mobility  Bed Mobility Overal bed mobility: Needs Assistance Bed Mobility: Supine to Sit     Supine to sit: Supervision     General bed mobility comments: Supervision for safety, takes extra time,    Transfers Overall transfer level:  Needs assistance Equipment used: Rolling walker (2 wheels) Transfers: Sit to/from Stand Sit to Stand: Supervision           General transfer comment: Performed from bed and low bench in hallway, cues for hand placement. Requires extra time and cues to facilitate.    Ambulation/Gait Ambulation/Gait assistance: Min guard Gait Distance (Feet): 95 Feet (x2) Assistive device: Rolling walker (2 wheels) Gait Pattern/deviations: Step-through pattern, Decreased stride length, Drifts right/left Gait velocity: slow Gait velocity interpretation: <1.31 ft/sec, indicative of household ambulator   General Gait Details: Better RW control today. Only one episode of drifting towards left, and required VC to correct. No physical assist. No buckling. Slower gait on return but denies SOB or fatigue. SpO2 94% on RA.   Stairs Stairs:  (Decline, family member reports they will provide as much assist as needed for small threshold to enter house.)           Wheelchair Mobility    Modified Rankin (Stroke Patients Only)       Balance Overall balance assessment: Needs assistance Sitting-balance support: Feet supported Sitting balance-Leahy Scale: Good     Standing balance support: No upper extremity supported, During functional activity Standing balance-Leahy Scale: Fair                              Cognition Arousal/Alertness: Awake/alert Behavior During Therapy: Flat affect Overall Cognitive Status: Impaired/Different from baseline Area of Impairment: Problem solving, Memory, Following commands, Awareness, Safety/judgement, Orientation                 Orientation Level: Disoriented to, Time (Thinks it is september,Reoriented.)   Memory: Decreased short-term memory Following Commands: Follows one step  commands consistently Safety/Judgement: Decreased awareness of safety Awareness: Emergent Problem Solving: Slow processing, Decreased initiation, Difficulty  sequencing, Requires verbal cues General Comments: Slightly improved from yesterday.        Exercises      General Comments General comments (skin integrity, edema, etc.): on 3L supplemental O2 at rest when PT entered room 98% SpO2, removed for awhile 99% SpO2, during and after ambulationg SpO2 94% on room air. Reapplied at 1L at end of session, RN notified.      Pertinent Vitals/Pain Pain Assessment Pain Assessment: No/denies pain    Home Living                          Prior Function            PT Goals (current goals can now be found in the care plan section) Acute Rehab PT Goals Patient Stated Goal: none stated PT Goal Formulation: With patient/family Time For Goal Achievement: 08/08/21 Potential to Achieve Goals: Good Progress towards PT goals: Progressing toward goals    Frequency    Min 3X/week      PT Plan Current plan remains appropriate    Co-evaluation              AM-PAC PT "6 Clicks" Mobility   Outcome Measure  Help needed turning from your back to your side while in a flat bed without using bedrails?: None Help needed moving from lying on your back to sitting on the side of a flat bed without using bedrails?: None Help needed moving to and from a bed to a chair (including a wheelchair)?: A Little Help needed standing up from a chair using your arms (e.g., wheelchair or bedside chair)?: A Little Help needed to walk in hospital room?: A Little Help needed climbing 3-5 steps with a railing? : A Lot 6 Click Score: 19    End of Session Equipment Utilized During Treatment: Gait belt Activity Tolerance: Patient tolerated treatment well Patient left: in chair;with call bell/phone within reach;with chair alarm set;with family/visitor present;with nursing/sitter in room Nurse Communication: Mobility status (O2 on 1L, SpO2 94%) PT Visit Diagnosis: Unsteadiness on feet (R26.81)     Time: 7591-6384 PT Time Calculation (min) (ACUTE  ONLY): 30 min  Charges:  $Gait Training: 8-22 mins $Therapeutic Activity: 8-22 mins                     Candie Mile, PT    Ellouise Newer 07/30/2021, 3:14 PM

## 2021-07-30 NOTE — Progress Notes (Signed)
Physical Therapy Note  07/30/21 0800  PT Visit Information  Last PT Received On 07/30/21  Reason Eval/Treat Not Completed Patient at procedure or test/unavailable   Patient off unit for Bronchoscopy. Will follow-up later today as time allows.  Candie Mile, PT

## 2021-07-30 NOTE — Progress Notes (Signed)
Pt discharged home this pm

## 2021-07-30 NOTE — Transfer of Care (Signed)
Immediate Anesthesia Transfer of Care Note  Patient: Jennifer Cowan  Procedure(s) Performed: ROBOTIC ASSISTED NAVIGATIONAL BRONCHOSCOPY (Left) BRONCHIAL NEEDLE ASPIRATION BIOPSIES BRONCHIAL BRUSHINGS BRONCHIAL BIOPSIES VIDEO BRONCHOSCOPY WITH RADIAL ENDOBRONCHIAL ULTRASOUND  Patient Location: PACU  Anesthesia Type:General  Level of Consciousness: awake, patient cooperative and responds to stimulation  Airway & Oxygen Therapy: Patient Spontanous Breathing and Patient connected to face mask oxygen  Post-op Assessment: Report given to RN and Post -op Vital signs reviewed and stable  Post vital signs: Reviewed and stable  Last Vitals:  Vitals Value Taken Time  BP 172/65 07/30/21 0845  Temp    Pulse 81 07/30/21 0845  Resp 20 07/30/21 0845  SpO2 99 % 07/30/21 0845  Vitals shown include unvalidated device data.  Last Pain:  Vitals:   07/30/21 0718  TempSrc: Temporal  PainSc: 0-No pain      Patients Stated Pain Goal: 0 (16/38/45 3646)  Complications: No notable events documented.

## 2021-07-30 NOTE — Interval H&P Note (Signed)
History and Physical Interval Note:  07/30/2021 7:28 AM  Jennifer Cowan  has presented today for surgery, with the diagnosis of lung mass, adenopathy.  The various methods of treatment have been discussed with the patient and family. After consideration of risks, benefits and other options for treatment, the patient has consented to  Procedure(s): ROBOTIC ASSISTED NAVIGATIONAL BRONCHOSCOPY (Left) VIDEO BRONCHOSCOPY WITH ENDOBRONCHIAL ULTRASOUND (Left) as a surgical intervention.  The patient's history has been reviewed, patient examined, no change in status, stable for surgery.  I have reviewed the patient's chart and labs.  Questions were answered to the patient's satisfaction.     Collene Gobble

## 2021-07-30 NOTE — Anesthesia Procedure Notes (Signed)
Procedure Name: Intubation Date/Time: 07/30/2021 7:41 AM  Performed by: Betha Loa, CRNAPre-anesthesia Checklist: Patient identified, Emergency Drugs available, Suction available and Patient being monitored Patient Re-evaluated:Patient Re-evaluated prior to induction Oxygen Delivery Method: Circle System Utilized Preoxygenation: Pre-oxygenation with 100% oxygen Induction Type: IV induction Ventilation: Mask ventilation without difficulty Laryngoscope Size: Mac and 3 Grade View: Grade I Tube type: Oral Tube size: 8.5 mm Number of attempts: 1 Airway Equipment and Method: Stylet and Oral airway Placement Confirmation: ETT inserted through vocal cords under direct vision, positive ETCO2 and breath sounds checked- equal and bilateral Secured at: 22 cm Tube secured with: Tape Dental Injury: Teeth and Oropharynx as per pre-operative assessment

## 2021-07-30 NOTE — Op Note (Signed)
Video Bronchoscopy with Robotic Assisted Bronchoscopic Navigation   Date of Operation: 07/30/2021   Pre-op Diagnosis: Left upper lobe mass  Post-op Diagnosis: Same  Surgeon: Baltazar Apo  Assistants: None  Anesthesia: General endotracheal anesthesia  Operation: Flexible video fiberoptic bronchoscopy with robotic assistance and biopsies.  Estimated Blood Loss: Minimal  Complications: None  Indications and History: Jennifer Cowan is a 76 y.o. female with history of breast cancer.  Found to have a left upper lobe mass, left hilar adenopathy.  Recommendation made to achieve a tissue diagnosis via robotic assisted navigational bronchoscopy with biopsies. The risks, benefits, complications, treatment options and expected outcomes were discussed with the patient.  The possibilities of pneumothorax, pneumonia, reaction to medication, pulmonary aspiration, perforation of a viscus, bleeding, failure to diagnose a condition and creating a complication requiring transfusion or operation were discussed with the patient who freely signed the consent.    Description of Procedure: The patient was seen in the Preoperative Area, was examined and was deemed appropriate to proceed.  The patient was taken to [facility] endoscopy room 3, identified as Jennifer Cowan and the procedure verified as Flexible Video Fiberoptic Bronchoscopy.  A Time Out was held and the above information confirmed.   Prior to the date of the procedure a high-resolution CT scan of the chest was performed. Utilizing ION software program a virtual tracheobronchial tree was generated to allow the creation of distinct navigation pathways to the patient's parenchymal abnormalities. After being taken to the operating room general anesthesia was initiated and the patient  was orally intubated. The video fiberoptic bronchoscope was introduced via the endotracheal tube and a general inspection was performed which showed normal right and left lung anatomy.  Aspiration of the bilateral mainstems was completed to remove any remaining secretions. Robotic catheter inserted into patient's endotracheal tube.   Target #1 left upper lobe mass: The distinct navigation pathways prepared prior to this procedure were then utilized to navigate to patient's lesion identified on CT scan. The robotic catheter was secured into place and the vision probe was withdrawn.  Lesion location was approximated using fluoroscopy and radial endobronchial ultrasound for peripheral targeting.  Local registration and targeting was performed using [employer]ios three-dimensional imaging.  Under fluoroscopic guidance transbronchial needle brushings, transbronchial needle biopsies, and transbronchial forceps biopsies were performed to be sent for cytology and pathology.    At the end of the procedure a general airway inspection was performed and there was no evidence of active bleeding. The bronchoscope was removed.  The patient tolerated the procedure well. There was no significant blood loss and there were no obvious complications. A post-procedural chest x-ray is pending.  Samples Target #1: 1. Transbronchial needle brushings from left upper lobe mass 2. Transbronchial Wang needle biopsies from left upper lobe mass 3. Transbronchial forceps biopsies from left upper lobe mass   Plans:  The patient will be transferred from the PACU to her medical room when recovered from anesthesia and after chest x-ray is reviewed. We will review the cytology, pathology and results with the patient when they become available.    Baltazar Apo, MD, PhD 07/30/2021, 8:34 AM [facility] [phone] or if no answer before 7:00PM call [phone] For any issues after 7:00PM please call eLink [phone]

## 2021-07-31 DIAGNOSIS — R5381 Other malaise: Secondary | ICD-10-CM | POA: Diagnosis not present

## 2021-07-31 DIAGNOSIS — C50919 Malignant neoplasm of unspecified site of unspecified female breast: Secondary | ICD-10-CM | POA: Diagnosis not present

## 2021-07-31 DIAGNOSIS — R918 Other nonspecific abnormal finding of lung field: Secondary | ICD-10-CM | POA: Diagnosis not present

## 2021-07-31 DIAGNOSIS — G9389 Other specified disorders of brain: Secondary | ICD-10-CM | POA: Diagnosis not present

## 2021-08-01 ENCOUNTER — Encounter (HOSPITAL_COMMUNITY): Payer: Self-pay | Admitting: Emergency Medicine

## 2021-08-02 ENCOUNTER — Telehealth: Payer: Self-pay

## 2021-08-02 LAB — SURGICAL PATHOLOGY

## 2021-08-02 NOTE — Discharge Summary (Signed)
Physician Discharge Summary   Patient: Jennifer Cowan MRN: [id number] DOB: [birth year]  Admit date:     07/24/2021  Discharge date: 07/30/2021  Discharge Physician: Berle Mull  PCP: [name], FNP  Recommendations at discharge: Follow-up with PCP in 1 week. Follow-up with pulmonology/radiology as recommended. Follow-up with oncology as recommended.  Discharge Diagnoses: Principal Problem:   Brain masses Active Problems:   Infiltrating duct carcinoma (HCC)   Mass of upper lobe of left lung   Primary hypertension   Mixed hyperlipidemia   Physical deconditioning   Hospital Course: Jennifer Cowan is a 76 y.o. female with medical history significant for HTN, HLD, left breast cancer s/p mastectomy in 1999 who presented with progressive functional decline (confusion, diminished ambulation). She underwent mammogram in May which was concerning for a right breast mass with calcifications.  She was recommended for biopsy but has not yet undergone prior to admission. She underwent work-up regarding her confusion.  Initial CT head showed 2 large brain lesions/masses with surrounding edema and regional mass effect.  She then underwent MRI brain for further evaluation.  This confirmed multiple intracranial masses, including 2 large lesions involving the left frontal and right temporal lobes with severe surrounding vasogenic edema.  Bilateral mass effect noted without herniation.  She was started on Decadron and neurosurgery was consulted. Extensive goals of care discussions were held the following morning with her family.  They are still discussing amongst themselves but they are amenable for at least consideration of brain radiation at this time. Palliative care also consulted along with medical oncology and radiation oncology.  IR consulted for discussion regarding right breast mass biopsy. She was able to undergo R breast biopsy on 07/26/21.  Biopsy came back positive for infiltrating ductal  carcinoma.  Niece has been informed about the results by radiation oncology.  Underwent bronchoscopy and biopsy of the lung mass as well.  Postprocedure patient was stable for discharge.  Assessment and Plan: Infiltrating ductal carcinoma of the right breast. Noted on the mammogram May 2023. Biopsy positive for IDC. Radiation oncology as well as medical oncology both following. Patient will follow-up with outpatient radiation oncology and medical oncology for therapy.   Brain mass.  Mass effect due to vasogenic edema. Acute confusional status. Presents with confusion work-up in the ER shows multiple brain mass, 2 large. Right breast mass biopsy positive for infiltrative ductal carcinoma. Patient also appears to have a lung mass which could be a separate primary or metastatic lesion. Patient's radiation therapy options for the brain mass changes significantly based on results of the biopsy and therefore pulmonary consulted to pursue lung biopsy. Outpatient follow-up with radiation oncology recommended. Mentation returned back to normal.  Mass of the left upper lobe lung. Pulmonary consulted. Bronchoscopy performed.  Postprocedure patient did not have any complication.  Was able to ambulate and ability to tolerate food. Follow-up with radiation oncology for management with regards to lung mass and brain mass radiation.   Deconditioning. PT OT recommends home with home health.  HLD. Continuing statin.  Goals of care conversation. Patient was seen by palliative care in the hospital. Recommend outpatient follow-up with palliative care for further goals of care clarity. Patient does not appear to have any pain right now but there is a possibility that she may start having cancer related pain as well. Recommend follow-up with palliative care for the same.  Consultants: Palliative care, radiation oncology, medical oncology, IR, pulmonary Procedures performed:  IR guided  biopsy Bronchoscopy and  biopsy DISCHARGE MEDICATION: Allergies as of 07/30/2021   No Known Allergies      Medication List     TAKE these medications    acetaminophen 500 MG tablet Commonly known as: TYLENOL Take 1,000 mg by mouth every 6 (six) hours as needed for moderate pain.   amLODipine 10 MG tablet Commonly known as: NORVASC TAKE 1 TABLET BY MOUTH DAILY   benazepril 10 MG tablet Commonly known as: LOTENSIN TAKE 1 TABLET BY MOUTH DAILY   ezetimibe 10 MG tablet Commonly known as: Zetia Take 1 tablet (10 mg total) by mouth daily.   meloxicam 7.5 MG tablet Commonly known as: MOBIC TAKE 1 TABLET BY MOUTH DAILY   simvastatin 40 MG tablet Commonly known as: ZOCOR TAKE 1 TABLET BY MOUTH DAILY What changed: when to take this   trolamine salicylate 10 % cream Commonly known as: ASPERCREME Apply 1 Application topically daily as needed for muscle pain.   Vitamin D3 125 MCG (5000 UT) Caps Take 5,000 Units by mouth daily.               Discharge Care Instructions  (From admission, onward)           Start     Ordered   07/30/21 0000  No dressing needed        07/30/21 1451   07/30/21 0000  Discharge wound care:       Comments: Apply steri-strip to R breast biopsy site. Use ice pack over breast as needed. Do not get breast wet x 1 day, no bathing/submerging in water x 1 week. No lifting with R arm for 24 hours   07/30/21 1451            Follow-up Information     [facility] Follow up.   Specialty: Home Health Services Contact information: [address] [place] [address] [phone]         [name], FNP. Schedule an appointment as soon as possible for a visit in 1 week(s).   Specialty: Family Medicine Contact information: [place] [address] [facility]. Schedule an appointment as soon as possible for a visit in 1 week(s).    Specialty: Radiation Oncology Contact information: [place] [id number] [place] [phone]               Disposition: Home Diet recommendation: Regular diet  Discharge Exam: Vitals:   07/30/21 0900 07/30/21 0915 07/30/21 0944 07/30/21 1205  BP: (!) 155/85 (!) 163/85 (!) 158/79 111/65  Pulse: 77 74 76 75  Resp: (!) _0 Temp:  97.8 F (36.6 C) 97.7 F (36.5 C) 98.7 F (37.1 C)  TempSrc:   Axillary Oral  SpO2: 100% 97% 100% 100%  Weight:      Height:       General: Appear in no distress; no visible Abnormal Neck Mass Or lumps, Conjunctiva normal Cardiovascular: S1 and S2 Present, no Murmur, Respiratory: good respiratory effort, Bilateral Air entry present and CTA, no Crackles, no wheezes Abdomen: Bowel Sound present, Non tender  Extremities: no Pedal edema Neurology: alert and oriented to time, place, and person  Gait not checked due to patient safety concerns Filed Weights   07/27/21 0442 07/28/21 0424 07/30/21 0500  Weight: 86.6 kg 84.2 kg 81.8 kg   Condition at discharge: stable  The results of  significant diagnostics from this hospitalization (including imaging, microbiology, ancillary and laboratory) are listed below for reference.   Imaging Studies: DG Chest Port 1 View  Result Date: 07/30/2021 CLINICAL DATA:  Left-sided bronchoscopy. EXAM: PORTABLE CHEST 1 VIEW COMPARISON:  CT chest dated July 24, 2021. FINDINGS: The heart size and mediastinal contours are within normal limits. Normal pulmonary vascularity. Unchanged 4.3 cm mass in the left upper lobe. No focal consolidation, pleural effusion, or pneumothorax. No acute osseous abnormality. IMPRESSION: 1. No active disease. 2. Unchanged left upper lobe mass. Electronically Signed   By: Titus Dubin M.D.   On: 07/30/2021 09:02   DG C-ARM BRONCHOSCOPY  Result Date: 07/30/2021 C-ARM BRONCHOSCOPY: Fluoroscopy was utilized by the requesting physician.  No  radiographic interpretation.   [name] RT BREAST BX W LOC DEV 1ST LESION IMG BX SPEC US GUIDE  Result Date: 07/26/2021 CLINICAL DATA:  76 year old inpatient with metastatic disease, unknown primary, breast suspected. History of left breast cancer status post mastectomy. EXAM: ULTRASOUND GUIDED RIGHT BREAST CORE NEEDLE BIOPSY COMPARISON:  Previous exam(s). PROCEDURE: I met with the patient and we discussed the procedure of ultrasound-guided biopsy, including benefits and alternatives. We discussed the high likelihood of a successful procedure. We discussed the risks of the procedure, including infection, bleeding, tissue injury, clip migration, and inadequate sampling. Informed written consent was given. The usual time-out protocol was performed immediately prior to the procedure. Lesion quadrant: Upper inner quadrant Using sterile technique and 1% Lidocaine as local anesthetic, under direct ultrasound visualization, a 12 gauge spring-loaded device was used to perform biopsy of a mass in the retroareolar right breast using a lateral approach. At the conclusion of the procedure a coil shaped tissue marker clip was deployed into the biopsy cavity. Follow up 2 view mammogram was performed and dictated separately. IMPRESSION: Ultrasound guided biopsy of a mass in the retroareolar right breast. No apparent complications. Electronically Signed   By: Audie Pinto M.D.   On: 07/26/2021 16:06   MR BRAIN W WO CONTRAST  Result Date: 07/24/2021 CLINICAL DATA:  Brain metastases EXAM: MRI HEAD WITHOUT AND WITH CONTRAST TECHNIQUE: Multiplanar, multiecho pulse sequences of the brain and surrounding structures were obtained without and with intravenous contrast. CONTRAST:  8.61m GADAVIST GADOBUTROL 1 MMOL/ML IV SOLN COMPARISON:  None Available. FINDINGS: Brain: There is a large, partially necrotic intraparenchymal mass in the right temporal lobe measuring 4.4 x 3.7 cm. There is severe surrounding hyperintense T2-weighted  signal. There is a heterogeneous intraparenchymal mass within the paramedian left frontal lobe measuring 3.0 x 2.6 cm, also a large amount of surrounding edema. There are 5 mm enhancing nodules in the anterior right temporal lobe and in the left temporal lobe. There is bilateral mass effect without herniation. No hydrocephalus. There is petechial hemorrhage within both masses. Vascular: Major flow voids are preserved. Skull and upper cervical spine: Normal calvarium and skull base. Visualized upper cervical spine and soft tissues are normal. Sinuses/Orbits:No paranasal sinus fluid levels or advanced mucosal thickening. No mastoid or middle ear effusion. Normal orbits. IMPRESSION: 1. Multiple intracranial metastases, including 2 large lesions of the left frontal and right temporal lobes with severe surrounding edema. 2. Bilateral mass effect without herniation. Electronically Signed   By: KUlyses JarredM.D.   On: 07/24/2021 21:19   CT CHEST ABDOMEN PELVIS W CONTRAST  Result Date: 07/24/2021 CLINICAL DATA:  Brain metastases, unknown primary EXAM: CT CHEST, ABDOMEN, AND PELVIS WITH CONTRAST TECHNIQUE: Multidetector CT imaging of the chest, abdomen and pelvis was performed  following the standard protocol during bolus administration of intravenous contrast. RADIATION DOSE REDUCTION: This exam was performed according to the departmental dose-optimization program which includes automated exposure control, adjustment of the mA and/or kV according to patient size and/or use of iterative reconstruction technique. CONTRAST:  38m OMNIPAQUE IOHEXOL 300 MG/ML  SOLN COMPARISON:  None Available. FINDINGS: CT CHEST FINDINGS Cardiovascular: Heart is normal size. Scattered coronary artery and aortic calcifications. Aorta normal caliber. Mediastinum/Nodes: Pre-vascular and left AP window adenopathy noted which appears low-density/necrotic. These nodes measure up to 1.8 cm in short axis diameter. Left hilar adenopathy with a short  axis diameter of 11 mm. No axillary adenopathy. Lungs/Pleura: Left upper lobe mass noted measuring up to 3.6 cm on image 50 of series 4. This is concerning for lung primary. No additional lung nodules or masses. No effusions. Musculoskeletal: Necrotic appearing lobulated mass in the right breast measures 6 x 5 cm concerning for breast cancer. Prior left mastectomy. No acute bony abnormality. CT ABDOMEN PELVIS FINDINGS Hepatobiliary: Low-density lesions within the liver, favor cysts. The largest measures up to 4.9 cm. Gallbladder unremarkable. Pancreas: No focal abnormality or ductal dilatation. Spleen: No focal abnormality.  Normal size. Adrenals/Urinary Tract: Contrast material seen within the renal collecting systems bilaterally from earlier imaging. Probable nonobstructing stones in the upper pole of the left kidney although this could represent contrast material rather than stones. Overlying scarring and cortical thinning. No hydronephrosis. Adrenal glands and urinary bladder unremarkable. Stomach/Bowel: Normal appendix. Stomach, large and small bowel grossly unremarkable. Vascular/Lymphatic: Aortic atherosclerosis. No evidence of aneurysm or adenopathy. Reproductive: Multiple lesions within the uterus most compatible with fibroids measuring up to 5.4 cm. No adnexal masses. Other: No free fluid or free air. Musculoskeletal: No acute bony abnormality. IMPRESSION: 6 x 5 cm necrotic appearing mass within the right breast concerning for breast cancer. 3.6 cm necrotic appearing mass in the left upper lobe. This could reflect primary lung cancer or metastasis. Prevascular, AP window and left hilar adenopathy. Low-density lesions in the liver appear to be simple cysts. Aortic atherosclerosis. Fibroid uterus. No acute findings in the abdomen or pelvis. Electronically Signed   By: KRolm BaptiseM.D.   On: 07/24/2021 21:12   CT HEAD WO CONTRAST  Result Date: 07/24/2021 CLINICAL DATA:  Delirium EXAM: CT HEAD WITHOUT  CONTRAST TECHNIQUE: Contiguous axial images were obtained from the base of the skull through the vertex without intravenous contrast. RADIATION DOSE REDUCTION: This exam was performed according to the departmental dose-optimization program which includes automated exposure control, adjustment of the mA and/or kV according to patient size and/or use of iterative reconstruction technique. COMPARISON:  None Available. FINDINGS: Brain: Approximately 2.5 cm mass lesion in the anterior left frontal lobe (series 3, image 17). Additional probable cystic/necrotic large mass lesion centered in the right parietal and temporal lobes. Extensive edema surrounding both lesions with associated regional mass effect. No definite evidence of acute hemorrhage. There is effacement of the ventricles due to mass effect from the above lesions. No hydrocephalus. No evidence of acute large vascular territory infarct. Vascular: Calcific intracranial atherosclerosis. Skull: No acute fracture. Sinuses/Orbits: Clear visualized sinuses. Other: No mastoid effusions. IMPRESSION: At least 2 large mass lesions in the left frontal and right parietotemporal regions, concerning for metastatic disease but incompletely assessed on this study. Extensive surrounding edema with regional mass effect. Recommend MRI with contrast for confirmation and further evaluation. Electronically Signed   By: FMargaretha SheffieldM.D.   On: 07/24/2021 13:58    Microbiology: Results for  orders placed or performed during the hospital encounter of 07/24/21  Urine Culture     Status: Abnormal   Collection Time: 07/24/21 10:47 AM   Specimen: Urine, Clean Catch  Result Value Ref Range Status   Specimen Description URINE, CLEAN CATCH  Final   Special Requests NONE  Final   Culture (A)  Final    30,000 COLONIES/mL DIPHTHEROIDS(CORYNEBACTERIUM SPECIES) Standardized susceptibility testing for this organism is not available. Performed at [facility], [place] [address] [place], [place] [address]    Report Status 07/25/2021 FINAL  Final    Labs: CBC: No results for input(s): "WBC", "NEUTROABS", "HGB", "HCT", "MCV", "PLT" in the last 168 hours. Basic Metabolic Panel: No results for input(s): "NA", "K", "CL", "CO2", "GLUCOSE", "BUN", "CREATININE", "CALCIUM", "MG", "PHOS" in the last 168 hours. Liver Function Tests: No results for input(s): "AST", "ALT", "ALKPHOS", "BILITOT", "PROT", "ALBUMIN" in the last 168 hours. CBG: No results for input(s): "GLUCAP" in the last 168 hours.  Discharge time spent: greater than 30 minutes.  Signed: Berle Mull, MD [facility] 07/30/2021

## 2021-08-02 NOTE — Telephone Encounter (Signed)
Spoke with caregiver, appointment scheduled 08/06/21.

## 2021-08-02 NOTE — Telephone Encounter (Signed)
Doesn't qualify for TCM, but needs regular hospital follow up appointment

## 2021-08-03 ENCOUNTER — Other Ambulatory Visit: Payer: Self-pay | Admitting: Family Medicine

## 2021-08-03 DIAGNOSIS — M159 Polyosteoarthritis, unspecified: Secondary | ICD-10-CM

## 2021-08-03 DIAGNOSIS — I10 Essential (primary) hypertension: Secondary | ICD-10-CM

## 2021-08-03 DIAGNOSIS — E782 Mixed hyperlipidemia: Secondary | ICD-10-CM

## 2021-08-04 ENCOUNTER — Telehealth: Payer: Self-pay | Admitting: Radiation Oncology

## 2021-08-04 ENCOUNTER — Other Ambulatory Visit: Payer: Self-pay | Admitting: Radiation Therapy

## 2021-08-04 DIAGNOSIS — Z791 Long term (current) use of non-steroidal anti-inflammatories (NSAID): Secondary | ICD-10-CM | POA: Diagnosis not present

## 2021-08-04 DIAGNOSIS — M8580 Other specified disorders of bone density and structure, unspecified site: Secondary | ICD-10-CM | POA: Diagnosis not present

## 2021-08-04 DIAGNOSIS — C7931 Secondary malignant neoplasm of brain: Secondary | ICD-10-CM

## 2021-08-04 DIAGNOSIS — Z9181 History of falling: Secondary | ICD-10-CM | POA: Diagnosis not present

## 2021-08-04 DIAGNOSIS — I10 Essential (primary) hypertension: Secondary | ICD-10-CM | POA: Diagnosis not present

## 2021-08-04 DIAGNOSIS — E782 Mixed hyperlipidemia: Secondary | ICD-10-CM | POA: Diagnosis not present

## 2021-08-04 DIAGNOSIS — M1990 Unspecified osteoarthritis, unspecified site: Secondary | ICD-10-CM | POA: Diagnosis not present

## 2021-08-04 DIAGNOSIS — G9389 Other specified disorders of brain: Secondary | ICD-10-CM | POA: Diagnosis not present

## 2021-08-04 DIAGNOSIS — Z9012 Acquired absence of left breast and nipple: Secondary | ICD-10-CM | POA: Diagnosis not present

## 2021-08-04 DIAGNOSIS — D509 Iron deficiency anemia, unspecified: Secondary | ICD-10-CM | POA: Diagnosis not present

## 2021-08-04 DIAGNOSIS — Z853 Personal history of malignant neoplasm of breast: Secondary | ICD-10-CM | POA: Diagnosis not present

## 2021-08-04 MED ORDER — DEXAMETHASONE 4 MG PO TABS: 4.0000 mg | ORAL_TABLET | Freq: Three times a day (TID) | ORAL | 1 refills | Status: DC

## 2021-08-04 MED ORDER — LORAZEPAM 0.5 MG PO TABS: ORAL_TABLET | ORAL | 0 refills | Status: DC

## 2021-08-04 NOTE — Telephone Encounter (Signed)
I called and spoke with the patient's niece. The patient has not been taking steroids and it appears that she was not sent home with these from the hospital.  Her niece states that she has been about status quo as far as cognitive function and neurologic status go.  She is at home but has family staying with her.  We discussed the preliminary findings after I had contacted pathology, and it appears that the biopsies from her lung are consistent with non-small cell lung cancer and it is most likely that what is going on in her brain has relationship to that rather than her breast.  She is still going to meet with Dr. Iruku on 08/13/2021.  I let her know that because her breast cancer is triple negative, I suspect that Dr. Iruku will be offering a course of chemotherapy.  We discussed that with these biopsy findings she would be appropriate to consider stereotactic radiosurgery.  Dr. Moody has previously recommended this if she had breast or non-small cell lung cancer in her lung specimen.  He recommends fractionation of the 2 larger lesions and to the 2 smaller lesions single fraction dose of SRS.  She is already met with Dr. Poole and we will contact his office to coordinate treatment.  We discussed the need for simulation planning, an T3 MRI scan to plan her treatment and to make sure that she is still a candidate for stereotactic radiosurgery.  We discussed the rationale for making a mask at the time of simulation, that she would be hearing from our special procedures navigator Susan Boyles, and we discussed the risks, benefits, long and short effects of therapy and the patient's neice is interested in proceeding.  She has also discussed these results with her aunt.  We help to coordinate and set up a MyChart account so that they can keep track of her appointments.  We discussed also the use of Ativan as the patient is somewhat claustrophobic.  A new prescription was sent into her pharmacy for this and for her  steroids to 4 mg 3 times a day of dexamethasone.  I reviewed the risks benefits and side effect profiles of both of these drugs.  See her on Monday for simulation after she undergoes 3 CT imaging on Friday this week.  Her case will be discussed in multidisciplinary brain oncology conference.  Her niece is in agreement and will be our point of contact coordinate all subsequent treatments and visits. 

## 2021-08-05 LAB — CYTOLOGY - NON PAP

## 2021-08-06 ENCOUNTER — Inpatient Hospital Stay: Payer: Medicare Other | Admitting: Family Medicine

## 2021-08-06 ENCOUNTER — Telehealth: Payer: Self-pay | Admitting: Nurse Practitioner

## 2021-08-06 ENCOUNTER — Ambulatory Visit
Admission: RE | Admit: 2021-08-06 | Discharge: 2021-08-06 | Disposition: A | Discharge status: Home / Self Care | Payer: Medicare Other | Source: Ambulatory Visit | Attending: Radiation Oncology | Admitting: Radiation Oncology

## 2021-08-06 DIAGNOSIS — C7931 Secondary malignant neoplasm of brain: Secondary | ICD-10-CM

## 2021-08-06 MED ORDER — GADOBENATE DIMEGLUMINE 529 MG/ML IV SOLN
17.0000 mL | Freq: Once | INTRAVENOUS | Status: AC | PRN
  Administered 2021-08-06: 17 mL via INTRAVENOUS

## 2021-08-06 MED ORDER — GADOBENATE DIMEGLUMINE 529 MG/ML IV SOLN: 17.0000 mL | Freq: Once | INTRAVENOUS | Status: DC | PRN

## 2021-08-06 NOTE — Progress Notes (Signed)
Has armband been applied?  Yes  Does patient have an allergy to IV contrast dye?: No   Has patient ever received premedication for IV contrast dye?: n/a   Does patient take metformin?: No  If patient does take metformin when was the last dose: n/a  Date of lab work: 07/25/2021 BUN: 9 CR: 0.66 eGfr: >60  IV site: Left AC  Has IV site been added to flowsheet?  Yes

## 2021-08-06 NOTE — Telephone Encounter (Signed)
Scheduled follow-up appointment per 7/21 los. Patient's niece is aware.

## 2021-08-09 ENCOUNTER — Ambulatory Visit
Admission: RE | Admit: 2021-08-09 | Discharge: 2021-08-09 | Disposition: A | Discharge status: Home / Self Care | Payer: Medicare Other | Source: Ambulatory Visit | Attending: Radiation Oncology | Admitting: Radiation Oncology

## 2021-08-09 ENCOUNTER — Other Ambulatory Visit: Payer: Self-pay

## 2021-08-09 ENCOUNTER — Encounter: Payer: Self-pay | Admitting: Radiation Oncology

## 2021-08-09 ENCOUNTER — Inpatient Hospital Stay: Payer: Medicare Other | Attending: Radiation Oncology

## 2021-08-09 VITALS — BP 153/68 | HR 91 | Temp 96.3°F | Resp 18 | Ht 68.0 in | Wt 185.4 lb

## 2021-08-09 DIAGNOSIS — Z510 Encounter for antineoplastic radiation therapy: Secondary | ICD-10-CM | POA: Insufficient documentation

## 2021-08-09 DIAGNOSIS — R918 Other nonspecific abnormal finding of lung field: Secondary | ICD-10-CM

## 2021-08-09 DIAGNOSIS — G9389 Other specified disorders of brain: Secondary | ICD-10-CM

## 2021-08-09 DIAGNOSIS — C7931 Secondary malignant neoplasm of brain: Secondary | ICD-10-CM | POA: Insufficient documentation

## 2021-08-09 DIAGNOSIS — C3412 Malignant neoplasm of upper lobe, left bronchus or lung: Secondary | ICD-10-CM | POA: Insufficient documentation

## 2021-08-09 DIAGNOSIS — Z171 Estrogen receptor negative status [ER-]: Secondary | ICD-10-CM | POA: Insufficient documentation

## 2021-08-09 DIAGNOSIS — C50211 Malignant neoplasm of upper-inner quadrant of right female breast: Secondary | ICD-10-CM | POA: Diagnosis not present

## 2021-08-09 DIAGNOSIS — C801 Malignant (primary) neoplasm, unspecified: Secondary | ICD-10-CM | POA: Insufficient documentation

## 2021-08-09 MED ORDER — LORAZEPAM 1 MG PO TABS
0.5000 mg | ORAL_TABLET | Freq: Once | ORAL | Status: AC
  Administered 2021-08-09: 0.5 mg via ORAL
  Filled 2021-08-09: qty 0.5

## 2021-08-09 MED ORDER — SODIUM CHLORIDE 0.9% FLUSH
10.0000 mL | Freq: Once | INTRAVENOUS | Status: AC
  Administered 2021-08-09: 10 mL via INTRAVENOUS

## 2021-08-11 ENCOUNTER — Encounter: Payer: Self-pay | Admitting: *Deleted

## 2021-08-11 DIAGNOSIS — M1990 Unspecified osteoarthritis, unspecified site: Secondary | ICD-10-CM | POA: Diagnosis not present

## 2021-08-11 DIAGNOSIS — Z9012 Acquired absence of left breast and nipple: Secondary | ICD-10-CM | POA: Diagnosis not present

## 2021-08-11 DIAGNOSIS — I10 Essential (primary) hypertension: Secondary | ICD-10-CM | POA: Diagnosis not present

## 2021-08-11 DIAGNOSIS — E782 Mixed hyperlipidemia: Secondary | ICD-10-CM | POA: Diagnosis not present

## 2021-08-11 DIAGNOSIS — M8580 Other specified disorders of bone density and structure, unspecified site: Secondary | ICD-10-CM | POA: Diagnosis not present

## 2021-08-11 DIAGNOSIS — Z791 Long term (current) use of non-steroidal anti-inflammatories (NSAID): Secondary | ICD-10-CM | POA: Diagnosis not present

## 2021-08-11 DIAGNOSIS — Z853 Personal history of malignant neoplasm of breast: Secondary | ICD-10-CM | POA: Diagnosis not present

## 2021-08-11 DIAGNOSIS — Z9181 History of falling: Secondary | ICD-10-CM | POA: Diagnosis not present

## 2021-08-11 DIAGNOSIS — G9389 Other specified disorders of brain: Secondary | ICD-10-CM | POA: Diagnosis not present

## 2021-08-11 DIAGNOSIS — D509 Iron deficiency anemia, unspecified: Secondary | ICD-10-CM | POA: Diagnosis not present

## 2021-08-11 NOTE — Progress Notes (Signed)
Spoke to [place] in Pathology to request PDL-1 on lung biopsy per request of Dr. Chryl Heck.

## 2021-08-12 ENCOUNTER — Other Ambulatory Visit: Payer: Self-pay | Admitting: *Deleted

## 2021-08-12 DIAGNOSIS — M8580 Other specified disorders of bone density and structure, unspecified site: Secondary | ICD-10-CM | POA: Diagnosis not present

## 2021-08-12 DIAGNOSIS — D509 Iron deficiency anemia, unspecified: Secondary | ICD-10-CM | POA: Diagnosis not present

## 2021-08-12 DIAGNOSIS — Z9012 Acquired absence of left breast and nipple: Secondary | ICD-10-CM | POA: Diagnosis not present

## 2021-08-12 DIAGNOSIS — I10 Essential (primary) hypertension: Secondary | ICD-10-CM | POA: Diagnosis not present

## 2021-08-12 DIAGNOSIS — E782 Mixed hyperlipidemia: Secondary | ICD-10-CM | POA: Diagnosis not present

## 2021-08-12 DIAGNOSIS — Z791 Long term (current) use of non-steroidal anti-inflammatories (NSAID): Secondary | ICD-10-CM | POA: Diagnosis not present

## 2021-08-12 DIAGNOSIS — M1990 Unspecified osteoarthritis, unspecified site: Secondary | ICD-10-CM | POA: Diagnosis not present

## 2021-08-12 DIAGNOSIS — C7931 Secondary malignant neoplasm of brain: Secondary | ICD-10-CM | POA: Diagnosis not present

## 2021-08-12 DIAGNOSIS — Z853 Personal history of malignant neoplasm of breast: Secondary | ICD-10-CM | POA: Diagnosis not present

## 2021-08-12 DIAGNOSIS — C3412 Malignant neoplasm of upper lobe, left bronchus or lung: Secondary | ICD-10-CM | POA: Diagnosis not present

## 2021-08-12 DIAGNOSIS — G9389 Other specified disorders of brain: Secondary | ICD-10-CM | POA: Diagnosis not present

## 2021-08-12 DIAGNOSIS — Z9181 History of falling: Secondary | ICD-10-CM | POA: Diagnosis not present

## 2021-08-12 NOTE — Progress Notes (Signed)
The proposed treatment discussed in conference is for discussion purpose only and is not a binding recommendation.  The patients have not been physically examined, or presented with their treatment options.  Therefore, final treatment plans cannot be decided.  

## 2021-08-13 ENCOUNTER — Encounter: Payer: Self-pay | Admitting: Hematology and Oncology

## 2021-08-13 ENCOUNTER — Inpatient Hospital Stay (HOSPITAL_BASED_OUTPATIENT_CLINIC_OR_DEPARTMENT_OTHER): Payer: Medicare Other | Admitting: Hematology and Oncology

## 2021-08-13 ENCOUNTER — Other Ambulatory Visit: Payer: Self-pay

## 2021-08-13 VITALS — BP 145/70 | HR 86 | Temp 98.2°F | Resp 18 | Ht 68.0 in | Wt 185.3 lb

## 2021-08-13 DIAGNOSIS — Z510 Encounter for antineoplastic radiation therapy: Secondary | ICD-10-CM | POA: Diagnosis not present

## 2021-08-13 DIAGNOSIS — C3490 Malignant neoplasm of unspecified part of unspecified bronchus or lung: Secondary | ICD-10-CM | POA: Insufficient documentation

## 2021-08-13 DIAGNOSIS — G9389 Other specified disorders of brain: Secondary | ICD-10-CM

## 2021-08-13 DIAGNOSIS — C3492 Malignant neoplasm of unspecified part of left bronchus or lung: Secondary | ICD-10-CM | POA: Diagnosis not present

## 2021-08-13 DIAGNOSIS — R918 Other nonspecific abnormal finding of lung field: Secondary | ICD-10-CM

## 2021-08-13 DIAGNOSIS — C50211 Malignant neoplasm of upper-inner quadrant of right female breast: Secondary | ICD-10-CM | POA: Diagnosis not present

## 2021-08-13 DIAGNOSIS — C7931 Secondary malignant neoplasm of brain: Secondary | ICD-10-CM | POA: Diagnosis not present

## 2021-08-13 DIAGNOSIS — Z171 Estrogen receptor negative status [ER-]: Secondary | ICD-10-CM | POA: Diagnosis not present

## 2021-08-13 DIAGNOSIS — C50919 Malignant neoplasm of unspecified site of unspecified female breast: Secondary | ICD-10-CM

## 2021-08-13 DIAGNOSIS — C3412 Malignant neoplasm of upper lobe, left bronchus or lung: Secondary | ICD-10-CM | POA: Diagnosis not present

## 2021-08-13 MED ORDER — LIDOCAINE-PRILOCAINE 2.5-2.5 % EX CREA: TOPICAL_CREAM | CUTANEOUS | 3 refills | Status: DC

## 2021-08-13 MED ORDER — ONDANSETRON HCL 8 MG PO TABS: 8.0000 mg | ORAL_TABLET | Freq: Two times a day (BID) | ORAL | 1 refills | Status: DC | PRN

## 2021-08-13 NOTE — Assessment & Plan Note (Signed)
This is highly concerning for metastatic disease from a primary lung cancer.  I agree with SRS.

## 2021-08-13 NOTE — Assessment & Plan Note (Addendum)
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  She also appears to have had some regional adenopathy including prevascular, AP window and left hilar adenopathy.  She has multiple intracranial metastatic lesions which are likely secondary to metastatic lung cancer given presence of regional adenopathy.  At this time plan is to proceed with [facility] first for her intracranial disease followed by considering weekly CarboTaxol with concurrent radiation for lung cancer followed by adjuvant immunotherapy

## 2021-08-13 NOTE — Assessment & Plan Note (Addendum)
Mammogram done Jun 15, 2021 with concern for right breast mass warranting further investigation.  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  There is no evidence of axillary lymphadenopathy on imaging. 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  She will start SRS to the brain lesions and likely will undergo concurrent chemoradiation for the lung cancer followed by adjuvant immunotherapy. I believe CarboTaxol will also have activity on the right breast cancer.  Post chemoradiation we can refer her to breast surgery for consideration of mastectomy or lumpectomy based on her response.  We will also try to present her in the breast [place] for further recommendations.

## 2021-08-13 NOTE — Progress Notes (Signed)
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) Mammogram done Jun 15, 2021 with concern for right breast mass warranting further investigation.  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  There is no evidence of axillary lymphadenopathy on imaging. 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  She will start SRS to the brain lesions and likely will undergo concurrent chemoradiation for the lung cancer followed by adjuvant immunotherapy. I believe CarboTaxol will also have activity on the right breast cancer.  Post chemoradiation we can refer her to breast surgery for consideration of mastectomy or lumpectomy based on her response.  We will also try to present her in the breast [place] for further recommendations.  Mass of upper lobe of left lung This is a 76 year old patient with 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  She also appears to have had some regional adenopathy including prevascular, AP window and left hilar adenopathy.  She has multiple intracranial metastatic lesions which are likely secondary to metastatic lung cancer given presence of regional adenopathy.  At this time plan is to proceed with SRS first for her intracranial disease followed by considering weekly CarboTaxol with concurrent radiation for lung cancer followed by adjuvant immunotherapy  Brain masses This is highly concerning for metastatic disease from a  primary lung cancer.  I agree with SRS.  Orders Placed This Encounter  Procedures   IR IMAGING GUIDED PORT INSERTION    Standing Status:   Future    Standing Expiration Date:   08/14/2022    Order Specific Question:   Reason for Exam (SYMPTOM  OR DIAGNOSIS REQUIRED)    Answer:   chemotherapy administration    Order Specific Question:   Preferred Imaging Location?    Answer:   [facility] will be sent on the breast specimen as well as the lung specimen.  PD-L1 testing has already been sent on the lung specimen. She will most likely need genetics at a later date.  HISTORY OF PRESENTING ILLNESS:  CORDIE BEAZLEY 76 y.o. 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  I saw Ms. Mcconahy for the first time while she was hospitalized with altered mental status.  At that time limited history was obtained however the rest was supplemented by the chart review.  It appears that patient had progressive functional decline for at least 2 to 3 weeks before the hospitalization.  Initially family thought it could be a urinary tract infection but then went she became very confused, she was brought to the hospital.  She also had a recent screening mammogram which showed a right breast mass and further evaluation was recommended but before further evaluation can be done she was hospitalized.  During hospitalization she had an MR brain imaging done on July 8 which showed multiple intracranial metastasis including 2 large lesions of the left frontal and right temporal lobes with severe  surrounding edema.  Bilateral mass effect without herniation.  CT chest abdomen pelvis with contrast showed 6 x 5 cm necrotic appearing mass within the right breast concerning for breast cancer.  No axillary adenopathy was noted.  3.6 cm necrotic appearing mass in the left upper lobe.  This could reflect primary lung  cancer or metastasis.  Prevascular, AP window and left hilar adenopathy noted.  Low-density lesions in the liver appear to be simple cyst.  Pathology from breast biopsy showed invasive ductal carcinoma, grade 3 prognostic panel showed ER/PR and HER2 negative Ki-67 was 90%.  Cytology of the lung left upper lobe mass showed malignant cells consistent with non-small cell carcinoma carcinoma is poorly differentiated and is positive for cytokeratin and has a high Ki-67 mitotic index.  The tumor is nonreactive for p40 and TTF-1 and also nonreactive for the breast markers.  Weak CD56 reactivity was noted.    Family also reports remote history of breast cancer on the left side when she underwent left mastectomy that was about 20 years ago did not need any adjuvant chemo, radiation or antiestrogen therapy.  She will start [place] on Monday and anticipate to completed by Friday.  She came today with her sister and her niece who will eventually be the medical power of attorney.  Patient is unable to give any history.  She struggles with remembering anything since this recent event.  Prior to all of this however she was living independently and was very healthy.  She is a never smoker.  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  She today denies any pain.  No new cough or chest pain or shortness of breath.  No other change in bowel habits or urinary habits. Rest of the pertinent 10 point ROS reviewed and negative.   MEDICAL HISTORY:  Past Medical History:  Diagnosis Date   Anemia    Arthritis    Breast cancer ([place])    Cancer ([place]) 1999   Breast Cancer   Hyperlipidemia    Hypertension    Osteopenia 01/12/2021    SURGICAL HISTORY: Past Surgical History:  Procedure Laterality Date   BREAST SURGERY  1999   Masectomy- left   BRONCHIAL BIOPSY  07/30/2021   Procedure: BRONCHIAL BIOPSIES;  Surgeon: Collene Gobble, MD;  Location: [facility]  ENDOSCOPY;  Service: Pulmonary;;   BRONCHIAL BRUSHINGS  07/30/2021   Procedure: BRONCHIAL BRUSHINGS;  Surgeon: Collene Gobble, MD;  Location[facility] ENDOSCOPY;  Service: Pulmonary;;   BRONCHIAL NEEDLE ASPIRATION BIOPSY  07/30/2021   Procedure: BRONCHIAL NEEDLE ASPIRATION BIOPSIES;  Surgeon: Collene Gobble, MD;  Location: [facility] ENDOSCOPY;  Service: Pulmonary;;   CYSTECTOMY     cyst removed off top of head   MASTECTOMY     VIDEO BRONCHOSCOPY WITH RADIAL ENDOBRONCHIAL ULTRASOUND  07/30/2021   Procedure: VIDEO BRONCHOSCOPY WITH RADIAL ENDOBRONCHIAL ULTRASOUND;  Surgeon: Collene Gobble, MD;  Location: [facility] ENDOSCOPY;  Service: Pulmonary;;    SOCIAL HISTORY: Social History   Socioeconomic History   Marital status: Single    Spouse name: Not on file   Number of children: 0   Years of education: 12   Highest education level: High school graduate  Occupational History   Occupation: retired  Tobacco Use   Smoking status: Never   Smokeless tobacco: Never  Vaping Use   Vaping Use: Never used  Substance and Sexual Activity   Alcohol use: No  Drug use: Never   Sexual activity: Yes    Birth control/protection: Post-menopausal  Other Topics Concern   Not on file  Social History Narrative   Not on file   Social Determinants of Health   Financial Resource Strain: Not on file  Food Insecurity: Not on file  Transportation Needs: Not on file  Physical Activity: Not on file  Stress: Not on file  Social Connections: Not on file  Intimate Partner Violence: Not on file    FAMILY HISTORY: Family History  Problem Relation Age of Onset   Arthritis Mother    Breast cancer Sister    Cancer Sister        Breast Cancer   Diabetes Sister    Hyperlipidemia Sister    Hypertension Sister    Stroke Sister    Diabetes Brother    Hypertension Brother     ALLERGIES:  has No Known Allergies.  MEDICATIONS:  Current Outpatient Medications  Medication Sig Dispense Refill   acetaminophen (TYLENOL)  500 MG tablet Take 1,000 mg by mouth every 6 (six) hours as needed for moderate pain.     amLODipine (NORVASC) 10 MG tablet TAKE 1 TABLET BY MOUTH DAILY 90 tablet 3   benazepril (LOTENSIN) 10 MG tablet TAKE 1 TABLET BY MOUTH DAILY 90 tablet 3   Cholecalciferol (VITAMIN D3) 125 MCG (5000 UT) CAPS Take 5,000 Units by mouth daily.     dexamethasone (DECADRON) 4 MG tablet Take 1 tablet (4 mg total) by mouth 3 (three) times daily. 90 tablet 1   ezetimibe (ZETIA) 10 MG tablet Take 1 tablet (10 mg total) by mouth daily. (Patient not taking: Reported on 07/24/2021) 90 tablet 3   LORazepam (ATIVAN) 0.5 MG tablet 1 tab po 30 minutes prior to radiation or MRI scans 30 tablet 0   meloxicam (MOBIC) 7.5 MG tablet TAKE 1 TABLET BY MOUTH DAILY 90 tablet 3   simvastatin (ZOCOR) 40 MG tablet TAKE 1 TABLET BY MOUTH DAILY 90 tablet 3   trolamine salicylate (ASPERCREME) 10 % cream Apply 1 Application topically daily as needed for muscle pain.     No current facility-administered medications for this visit.     PHYSICAL EXAMINATION: ECOG PERFORMANCE STATUS: 0 - Asymptomatic  Vitals:   08/13/21 1051  BP: (!) 145/70  Pulse: 86  Resp: 18  Temp: 98.2 F (36.8 C)  SpO2: 97%   Filed Weights   08/13/21 1051  Weight: 185 lb 4.8 oz (84.1 kg)    Physical Exam Constitutional:      Appearance: Normal appearance.  Cardiovascular:     Rate and Rhythm: Normal rate and regular rhythm.     Pulses: Normal pulses.     Heart sounds: Normal heart sounds.  Pulmonary:     Effort: Pulmonary effort is normal.     Breath sounds: Normal breath sounds.  Chest:       Comments: Large palpable mass in the right breast in the central quadrant about 6 cm in largest dimension.  No palpable axillary lymphadenopathy Abdominal:     General: Abdomen is flat. Bowel sounds are normal.     Palpations: Abdomen is soft.  Musculoskeletal:        General: No swelling. Normal range of motion.     Cervical back: Normal range of motion  and neck supple. No rigidity.  Lymphadenopathy:     Cervical: No cervical adenopathy.  Skin:    General: Skin is warm and dry.  Neurological:  Mental Status: She is alert.      LABORATORY DATA:  I have reviewed the data as listed Lab Results  Component Value Date   WBC 6.7 07/25/2021   HGB 14.6 07/25/2021   HCT 44.6 07/25/2021   MCV 89.0 07/25/2021   PLT 400 07/25/2021     Chemistry      Component Value Date/Time   NA 140 07/25/2021 0111   NA 141 07/07/2021 1538   K 3.5 07/25/2021 0111   CL 104 07/25/2021 0111   CO2 22 07/25/2021 0111   BUN 9 07/25/2021 0111   BUN 9 07/07/2021 1538   CREATININE 0.66 07/25/2021 0111      Component Value Date/Time   CALCIUM 9.7 07/25/2021 0111   ALKPHOS 71 07/24/2021 1242   AST 19 07/24/2021 1242   ALT 18 07/24/2021 1242   BILITOT 0.6 07/24/2021 1242   BILITOT 0.4 07/07/2021 1538       RADIOGRAPHIC STUDIES: I have personally reviewed the radiological images as listed and agreed with the findings in the report. MR Brain W Wo Contrast  Result Date: 08/06/2021 CLINICAL DATA:  Metastasis to brain (HCC) C79.31 (ICD-10-CM). Metastatic disease evaluation; 3T SRS Protocol for radiation treatment planning. EXAM: MRI HEAD WITHOUT AND WITH CONTRAST TECHNIQUE: Multiplanar, multiecho pulse sequences of the brain and surrounding structures were obtained without and with intravenous contrast. CONTRAST:  2m MULTIHANCE GADOBENATE DIMEGLUMINE 529 MG/ML IV SOLN COMPARISON:  MRI of the brain July 24, 2021. FINDINGS: Postcontrast images are partially degraded by motion. Brain: No acute infarction, acute hemorrhage hemorrhage, hydrocephalus or extra-axial collection. Six enhancing lesions concerning for metastatic deposits are identified: - left parafalcine lesion, solid with microcystic areas, may represent intraparenchymal lesion extending to the cortex versus extra-axial lesion invading the parenchyma, 36 x 29 x 27 mm, with prominent surrounding  vasogenic edema in the left frontal lobe and corpus callosum, series 11, image 123; - right temporal lobe, predominantly necrotic with irregular peripheral contrast enhancement, 54 x 42 x 39 mm, with prominent surrounding vasogenic edema extending into the right internal capsule and external capsule, series 11, image 78; - left temporal lobe, 3 mm, series 11, image 69; - left occipital lobe, 2 mm, series 11, image 64; - right temporal lobe just anterior to the larger lesion, 5 mm, series 11, image 63; - left occipital lobe seen only on coronal postcontrast images, 2 mm, series 12, image 4. Mass effect related to the right temporal lobe dominant lesion resulting in approximately 2 mm leftward midline shift. 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 Small amount of scattered nonspecific T2 hyperintense lesions of the white matter, likely represent chronic microangiopathy. Vascular: Normal flow voids. Skull and upper cervical spine: Normal marrow signal. Sinuses/Orbits: Negative. Other: None. IMPRESSION: Six enhancing lesions identified, consistent with metastatic disease to the brain, as detailed above. Significant mass effect related to the right temporal and left frontal lesions with surrounding vasogenic edema. A 2 mm leftward midline shift, improved from 4 mm on prior MRI. Electronically Signed   By: KPedro EarlsM.D.   On: 08/06/2021 17:05   DG Chest Port 1 View  Result Date: 07/30/2021 CLINICAL DATA:  Left-sided bronchoscopy. EXAM: PORTABLE CHEST 1 VIEW COMPARISON:  CT chest dated July 24, 2021. FINDINGS: The heart size and mediastinal contours are within normal limits. Normal pulmonary vascularity. Unchanged 4.3 cm mass in the left upper lobe. No focal consolidation, pleural effusion, or pneumothorax. No acute osseous abnormality. IMPRESSION: 1. No active disease.  2. Unchanged left upper lobe mass. Electronically Signed   By: Titus Dubin M.D.   On: 07/30/2021 09:02   DG  C-ARM BRONCHOSCOPY  Result Date: 07/30/2021 C-ARM BRONCHOSCOPY: Fluoroscopy was utilized by the requesting physician.  No radiographic interpretation.   [name] RT BREAST BX W LOC DEV 1ST LESION IMG BX SPEC US GUIDE  Result Date: 07/26/2021 CLINICAL DATA:  76 year old inpatient with metastatic disease, unknown primary, breast suspected. History of left breast cancer status post mastectomy. EXAM: ULTRASOUND GUIDED RIGHT BREAST CORE NEEDLE BIOPSY COMPARISON:  Previous exam(s). PROCEDURE: I met with the patient and we discussed the procedure of ultrasound-guided biopsy, including benefits and alternatives. We discussed the high likelihood of a successful procedure. We discussed the risks of the procedure, including infection, bleeding, tissue injury, clip migration, and inadequate sampling. Informed written consent was given. The usual time-out protocol was performed immediately prior to the procedure. Lesion quadrant: Upper inner quadrant Using sterile technique and 1% Lidocaine as local anesthetic, under direct ultrasound visualization, a 12 gauge spring-loaded device was used to perform biopsy of a mass in the retroareolar right breast using a lateral approach. At the conclusion of the procedure a coil shaped tissue marker clip was deployed into the biopsy cavity. Follow up 2 view mammogram was performed and dictated separately. IMPRESSION: Ultrasound guided biopsy of a mass in the retroareolar right breast. No apparent complications. Electronically Signed   By: Audie Pinto M.D.   On: 07/26/2021 16:06   MR BRAIN W WO CONTRAST  Result Date: 07/24/2021 CLINICAL DATA:  Brain metastases EXAM: MRI HEAD WITHOUT AND WITH CONTRAST TECHNIQUE: Multiplanar, multiecho pulse sequences of the brain and surrounding structures were obtained without and with intravenous contrast. CONTRAST:  8.37m GADAVIST GADOBUTROL 1 MMOL/ML IV SOLN COMPARISON:  None Available. FINDINGS: Brain: There is a large, partially necrotic  intraparenchymal mass in the right temporal lobe measuring 4.4 x 3.7 cm. There is severe surrounding hyperintense T2-weighted signal. There is a heterogeneous intraparenchymal mass within the paramedian left frontal lobe measuring 3.0 x 2.6 cm, also a large amount of surrounding edema. There are 5 mm enhancing nodules in the anterior right temporal lobe and in the left temporal lobe. There is bilateral mass effect without herniation. No hydrocephalus. There is petechial hemorrhage within both masses. Vascular: Major flow voids are preserved. Skull and upper cervical spine: Normal calvarium and skull base. Visualized upper cervical spine and soft tissues are normal. Sinuses/Orbits:No paranasal sinus fluid levels or advanced mucosal thickening. No mastoid or middle ear effusion. Normal orbits. IMPRESSION: 1. Multiple intracranial metastases, including 2 large lesions of the left frontal and right temporal lobes with severe surrounding edema. 2. Bilateral mass effect without herniation. Electronically Signed   By: KUlyses JarredM.D.   On: 07/24/2021 21:19   CT CHEST ABDOMEN PELVIS W CONTRAST  Result Date: 07/24/2021 CLINICAL DATA:  Brain metastases, unknown primary EXAM: CT CHEST, ABDOMEN, AND PELVIS WITH CONTRAST TECHNIQUE: Multidetector CT imaging of the chest, abdomen and pelvis was performed following the standard protocol during bolus administration of intravenous contrast. RADIATION DOSE REDUCTION: This exam was performed according to the departmental dose-optimization program which includes automated exposure control, adjustment of the mA and/or kV according to patient size and/or use of iterative reconstruction technique. CONTRAST:  860mOMNIPAQUE IOHEXOL 300 MG/ML  SOLN COMPARISON:  None Available. FINDINGS: CT CHEST FINDINGS Cardiovascular: Heart is normal size. Scattered coronary artery and aortic calcifications. Aorta normal caliber. Mediastinum/Nodes: Pre-vascular and left AP window adenopathy noted  which appears  low-density/necrotic. These nodes measure up to 1.8 cm in short axis diameter. Left hilar adenopathy with a short axis diameter of 11 mm. No axillary adenopathy. Lungs/Pleura: Left upper lobe mass noted measuring up to 3.6 cm on image 50 of series 4. This is concerning for lung primary. No additional lung nodules or masses. No effusions. Musculoskeletal: Necrotic appearing lobulated mass in the right breast measures 6 x 5 cm concerning for breast cancer. Prior left mastectomy. No acute bony abnormality. CT ABDOMEN PELVIS FINDINGS Hepatobiliary: Low-density lesions within the liver, favor cysts. The largest measures up to 4.9 cm. Gallbladder unremarkable. Pancreas: No focal abnormality or ductal dilatation. Spleen: No focal abnormality.  Normal size. Adrenals/Urinary Tract: Contrast material seen within the renal collecting systems bilaterally from earlier imaging. Probable nonobstructing stones in the upper pole of the left kidney although this could represent contrast material rather than stones. Overlying scarring and cortical thinning. No hydronephrosis. Adrenal glands and urinary bladder unremarkable. Stomach/Bowel: Normal appendix. Stomach, large and small bowel grossly unremarkable. Vascular/Lymphatic: Aortic atherosclerosis. No evidence of aneurysm or adenopathy. Reproductive: Multiple lesions within the uterus most compatible with fibroids measuring up to 5.4 cm. No adnexal masses. Other: No free fluid or free air. Musculoskeletal: No acute bony abnormality. IMPRESSION: 6 x 5 cm necrotic appearing mass within the right breast concerning for breast cancer. 3.6 cm necrotic appearing mass in the left upper lobe. This could reflect primary lung cancer or metastasis. Prevascular, AP window and left hilar adenopathy. Low-density lesions in the liver appear to be simple cysts. Aortic atherosclerosis. Fibroid uterus. No acute findings in the abdomen or pelvis. Electronically Signed   By: Rolm Baptise  M.D.   On: 07/24/2021 21:12   CT HEAD WO CONTRAST  Result Date: 07/24/2021 CLINICAL DATA:  [place]ium EXAM: CT HEAD WITHOUT CONTRAST TECHNIQUE: Contiguous axial images were obtained from the base of the skull through the vertex without intravenous contrast. RADIATION DOSE REDUCTION: This exam was performed according to the departmental dose-optimization program which includes automated exposure control, adjustment of the mA and/or kV according to patient size and/or use of iterative reconstruction technique. COMPARISON:  None Available. FINDINGS: Brain: Approximately 2.5 cm mass lesion in the anterior left frontal lobe (series 3, image 17). Additional probable cystic/necrotic large mass lesion centered in the right parietal and temporal lobes. Extensive edema surrounding both lesions with associated regional mass effect. No definite evidence of acute hemorrhage. There is effacement of the ventricles due to mass effect from the above lesions. No hydrocephalus. No evidence of acute large vascular territory infarct. Vascular: Calcific intracranial atherosclerosis. Skull: No acute fracture. Sinuses/Orbits: Clear visualized sinuses. Other: No mastoid effusions. IMPRESSION: At least 2 large mass lesions in the left frontal and right parietotemporal regions, concerning for metastatic disease but incompletely assessed on this study. Extensive surrounding edema with regional mass effect. Recommend MRI with contrast for confirmation and further evaluation. Electronically Signed   By: Margaretha Sheffield M.D.   On: 07/24/2021 13:58    All questions were answered. The patient knows to call the clinic with any problems, questions or concerns. I spent 60 minutes in the care of this patient including H and P, review of records, counseling and coordination of care.     Benay Pike, MD 08/13/2021 2:59 PM

## 2021-08-13 NOTE — Progress Notes (Signed)
START ON PATHWAY REGIMEN - Non-Small Cell Lung     A cycle is every 7 days, concurrent with RT:     Paclitaxel      Carboplatin   **Always confirm dose/schedule in your pharmacy ordering system**  Patient Characteristics: Preoperative or Nonsurgical Candidate (Clinical Staging), Stage II, Nonsurgical Candidate Therapeutic Status: Preoperative or Nonsurgical Candidate (Clinical Staging) AJCC T Category: cT2 AJCC N Category: cN1 AJCC M Category: cM0 AJCC 8 Stage Grouping: IIB Intent of Therapy: Non-Curative / Palliative Intent, Discussed with Patient

## 2021-08-13 NOTE — Progress Notes (Signed)
Non-Small Cell Lung - No Medical Intervention - Off Treatment.  Patient Characteristics: Preoperative or Nonsurgical Candidate (Clinical Staging), Stage II, Nonsurgical Candidate Therapeutic Status: Preoperative or Nonsurgical Candidate (Clinical Staging) AJCC T Category: cT2 AJCC N Category: cN1 AJCC M Category: cM0 AJCC 8 Stage Grouping: IIB

## 2021-08-14 ENCOUNTER — Other Ambulatory Visit: Payer: Self-pay

## 2021-08-16 ENCOUNTER — Other Ambulatory Visit: Payer: Self-pay

## 2021-08-16 ENCOUNTER — Ambulatory Visit
Admission: RE | Admit: 2021-08-16 | Discharge: 2021-08-16 | Disposition: A | Discharge status: Home / Self Care | Payer: Medicare Other | Source: Ambulatory Visit | Attending: Radiation Oncology | Admitting: Radiation Oncology

## 2021-08-16 DIAGNOSIS — C50211 Malignant neoplasm of upper-inner quadrant of right female breast: Secondary | ICD-10-CM | POA: Diagnosis not present

## 2021-08-16 DIAGNOSIS — C3412 Malignant neoplasm of upper lobe, left bronchus or lung: Secondary | ICD-10-CM | POA: Diagnosis not present

## 2021-08-16 DIAGNOSIS — C7931 Secondary malignant neoplasm of brain: Secondary | ICD-10-CM | POA: Diagnosis not present

## 2021-08-16 DIAGNOSIS — Z510 Encounter for antineoplastic radiation therapy: Secondary | ICD-10-CM | POA: Diagnosis not present

## 2021-08-16 DIAGNOSIS — Z171 Estrogen receptor negative status [ER-]: Secondary | ICD-10-CM | POA: Diagnosis not present

## 2021-08-16 LAB — RAD ONC ARIA SESSION SUMMARY
Course Elapsed Days: 0
Plan Fractions Treated to Date: 1
Plan Prescribed Dose Per Fraction: 6 Gy
Plan Total Fractions Prescribed: 1
Plan Total Prescribed Dose: 6 Gy
Reference Point Dosage Given to Date: 6 Gy
Reference Point Session Dosage Given: 6 Gy
Session Number: 1

## 2021-08-16 NOTE — Progress Notes (Signed)
Jennifer Cowan rested with [name] for 30 minutes following her [name] treatment.  Patient denies headache, dizziness, nausea, diplopia or ringing in the ears. Denies fatigue. Patient without complaints. Understands to avoid strenuous activity for the next 24 hours and call [phone] with needs.  Ambulated out of the clinic with family.  Aware of next radiation treatment.  BP (!) 145/68 (BP Location: Left Arm, Patient Position: Sitting, Cuff Size: Normal)   Pulse 96   Temp (!) 97.3 F (36.3 C)   Resp 20   SpO2 100%    Wilder Kurowski M. Leonie Green, BSN

## 2021-08-16 NOTE — Op Note (Signed)
  Name: Jennifer Cowan  MRN: [id number]  Date: 08/16/2021   DOB: 09-Dec-1945  Stereotactic Radiosurgery Operative Note  PRE-OPERATIVE DIAGNOSIS:  Multiple Brain Metastases  POST-OPERATIVE DIAGNOSIS:  Multiple Brain Metastases  PROCEDURE:  Stereotactic Radiosurgery  SURGEON:  Charlie Pitter, MD  NARRATIVE: The patient underwent a radiation treatment planning session in the radiation oncology simulation suite under the care of the radiation oncology physician and physicist.  I participated closely in the radiation treatment planning afterwards. The patient underwent planning CT which was fused to 3T high resolution MRI with 1 mm axial slices.  These images were fused on the planning system.  We contoured the gross target volumes and subsequently expanded this to yield the Planning Target Volume. I actively participated in the planning process.  I helped to define and review the target contours and also the contours of the optic pathway, eyes, brainstem and selected nearby organs at risk.  All the dose constraints for critical structures were reviewed and compared to AAPM Task Group 101.  The prescription dose conformity was reviewed.  I approved the plan electronically.    Accordingly, Jennifer Cowan was brought to the TrueBeam stereotactic radiation treatment linac and placed in the custom immobilization mask.  The patient was aligned according to the IR fiducial markers with Brain[facility] Exactrac, then orthogonal x-rays were used in ExacTrac with the 6DOF robotic table and the shifts were made to align the patient  Jennifer Cowan received stereotactic radiosurgery uneventfully.    Lesions treated:  5   Complex lesions treated:  1 (>3.5 cm, <109mm of optic path, or within the brainstem)   The detailed description of the procedure is recorded in the radiation oncology procedure note.  I was present for the duration of the procedure.  DISPOSITION:  Following delivery, the patient was transported to nursing in  stable condition and monitored for possible acute effects to be discharged to home in stable condition with follow-up in one month.  Charlie Pitter, MD 08/16/2021 3:57 PM

## 2021-08-17 ENCOUNTER — Ambulatory Visit
Admission: RE | Admit: 2021-08-17 | Discharge: 2021-08-17 | Disposition: A | Discharge status: Home / Self Care | Payer: Medicare Other | Source: Ambulatory Visit | Attending: Radiation Oncology | Admitting: Radiation Oncology

## 2021-08-17 ENCOUNTER — Ambulatory Visit: Payer: Medicare Other | Admitting: Radiation Oncology

## 2021-08-17 ENCOUNTER — Other Ambulatory Visit: Payer: Self-pay

## 2021-08-17 VITALS — BP 133/63 | HR 97 | Temp 97.5°F | Resp 20

## 2021-08-17 DIAGNOSIS — C3492 Malignant neoplasm of unspecified part of left bronchus or lung: Secondary | ICD-10-CM | POA: Insufficient documentation

## 2021-08-17 DIAGNOSIS — C50211 Malignant neoplasm of upper-inner quadrant of right female breast: Secondary | ICD-10-CM | POA: Diagnosis not present

## 2021-08-17 DIAGNOSIS — Z5111 Encounter for antineoplastic chemotherapy: Secondary | ICD-10-CM | POA: Insufficient documentation

## 2021-08-17 DIAGNOSIS — C7931 Secondary malignant neoplasm of brain: Secondary | ICD-10-CM | POA: Insufficient documentation

## 2021-08-17 DIAGNOSIS — Z171 Estrogen receptor negative status [ER-]: Secondary | ICD-10-CM | POA: Diagnosis not present

## 2021-08-17 DIAGNOSIS — G9389 Other specified disorders of brain: Secondary | ICD-10-CM | POA: Insufficient documentation

## 2021-08-17 DIAGNOSIS — Z79899 Other long term (current) drug therapy: Secondary | ICD-10-CM | POA: Diagnosis not present

## 2021-08-17 DIAGNOSIS — C3412 Malignant neoplasm of upper lobe, left bronchus or lung: Secondary | ICD-10-CM | POA: Insufficient documentation

## 2021-08-17 DIAGNOSIS — Z510 Encounter for antineoplastic radiation therapy: Secondary | ICD-10-CM | POA: Insufficient documentation

## 2021-08-17 LAB — RAD ONC ARIA SESSION SUMMARY
Course Elapsed Days: 1
Plan Fractions Treated to Date: 1
Plan Prescribed Dose Per Fraction: 6 Gy
Plan Total Fractions Prescribed: 2
Plan Total Prescribed Dose: 12 Gy
Reference Point Dosage Given to Date: 12 Gy
Reference Point Session Dosage Given: 6 Gy
Session Number: 2

## 2021-08-17 NOTE — Progress Notes (Signed)
Jennifer Cowan rested with [name] for 15 minutes following her [place] treatment.  Patient denies headache, dizziness, nausea, diplopia or ringing in the ears. Denies fatigue. Patient without complaints. Understands to avoid strenuous activity for the next 24 hours and call [phone] with needs.  Ambulated out of the clinic with family.  Aware of next radiation treatment.  BP 133/63 (BP Location: Left Arm, Patient Position: Sitting, Cuff Size: Normal)   Pulse 97   Temp (!) 97.5 F (36.4 C)   Resp 20   SpO2 100%    Jennifer Cowan, BSN

## 2021-08-18 ENCOUNTER — Telehealth: Payer: Self-pay | Admitting: *Deleted

## 2021-08-18 ENCOUNTER — Other Ambulatory Visit: Payer: Self-pay

## 2021-08-18 ENCOUNTER — Ambulatory Visit
Admission: RE | Admit: 2021-08-18 | Discharge: 2021-08-18 | Disposition: A | Discharge status: Home / Self Care | Payer: Medicare Other | Source: Ambulatory Visit | Attending: Radiation Oncology | Admitting: Radiation Oncology

## 2021-08-18 ENCOUNTER — Inpatient Hospital Stay: Payer: Medicare Other

## 2021-08-18 ENCOUNTER — Ambulatory Visit (INDEPENDENT_AMBULATORY_CARE_PROVIDER_SITE_OTHER): Payer: Medicare Other

## 2021-08-18 DIAGNOSIS — E782 Mixed hyperlipidemia: Secondary | ICD-10-CM | POA: Diagnosis not present

## 2021-08-18 DIAGNOSIS — Z510 Encounter for antineoplastic radiation therapy: Secondary | ICD-10-CM | POA: Diagnosis not present

## 2021-08-18 DIAGNOSIS — M8580 Other specified disorders of bone density and structure, unspecified site: Secondary | ICD-10-CM

## 2021-08-18 DIAGNOSIS — G9389 Other specified disorders of brain: Secondary | ICD-10-CM | POA: Diagnosis not present

## 2021-08-18 DIAGNOSIS — Z791 Long term (current) use of non-steroidal anti-inflammatories (NSAID): Secondary | ICD-10-CM

## 2021-08-18 DIAGNOSIS — C3412 Malignant neoplasm of upper lobe, left bronchus or lung: Secondary | ICD-10-CM | POA: Diagnosis not present

## 2021-08-18 DIAGNOSIS — C50211 Malignant neoplasm of upper-inner quadrant of right female breast: Secondary | ICD-10-CM | POA: Diagnosis not present

## 2021-08-18 DIAGNOSIS — M1990 Unspecified osteoarthritis, unspecified site: Secondary | ICD-10-CM

## 2021-08-18 DIAGNOSIS — C7931 Secondary malignant neoplasm of brain: Secondary | ICD-10-CM

## 2021-08-18 DIAGNOSIS — Z171 Estrogen receptor negative status [ER-]: Secondary | ICD-10-CM | POA: Diagnosis not present

## 2021-08-18 DIAGNOSIS — I10 Essential (primary) hypertension: Secondary | ICD-10-CM

## 2021-08-18 DIAGNOSIS — Z5111 Encounter for antineoplastic chemotherapy: Secondary | ICD-10-CM | POA: Diagnosis not present

## 2021-08-18 DIAGNOSIS — Z79899 Other long term (current) drug therapy: Secondary | ICD-10-CM | POA: Diagnosis not present

## 2021-08-18 DIAGNOSIS — D509 Iron deficiency anemia, unspecified: Secondary | ICD-10-CM | POA: Diagnosis not present

## 2021-08-18 LAB — RAD ONC ARIA SESSION SUMMARY
Course Elapsed Days: 2
Plan Fractions Treated to Date: 1
Plan Prescribed Dose Per Fraction: 6 Gy
Plan Total Fractions Prescribed: 2
Plan Total Prescribed Dose: 12 Gy
Reference Point Dosage Given to Date: 18 Gy
Reference Point Session Dosage Given: 6 Gy
Session Number: 3

## 2021-08-18 NOTE — Telephone Encounter (Signed)
This RN faxed [facility] request for breast cancer on 08/17/2021 with confirm receipt.  Today this RN contacted [facility] to follow up if they have 2 request on pt for her pathology proven 2 separate cancers.  Only request noted is the order for the breast specimen.  Noted per call on [facility] of need for 2 separate studies due to 2 cancers on same patient.  This RN then faxed order for the lung specimen with confirmation of receipt.

## 2021-08-18 NOTE — Progress Notes (Signed)
Mrs. Erno to nursing for 15 minute observation following her [place] treatment.  Patient denies headache, dizziness, nausea, diplopia or ringing in the ears. Mild fatigue. Patient reminded to avoid strenuous activity for the next 24 hours and call [phone] with needs.  Ambulated out of the clinic with family.  Vitals:  97.2-102-18-125/67. O2 sat 99.

## 2021-08-19 ENCOUNTER — Encounter: Payer: Self-pay | Admitting: Hematology and Oncology

## 2021-08-19 ENCOUNTER — Other Ambulatory Visit: Payer: Self-pay | Admitting: Radiology

## 2021-08-19 ENCOUNTER — Other Ambulatory Visit (HOSPITAL_COMMUNITY): Payer: Self-pay | Admitting: Physician Assistant

## 2021-08-19 ENCOUNTER — Ambulatory Visit: Payer: Medicare Other | Admitting: Radiation Oncology

## 2021-08-19 ENCOUNTER — Other Ambulatory Visit: Payer: Self-pay

## 2021-08-19 ENCOUNTER — Ambulatory Visit
Admission: RE | Admit: 2021-08-19 | Discharge: 2021-08-19 | Disposition: A | Discharge status: Home / Self Care | Payer: Medicare Other | Source: Ambulatory Visit | Attending: Radiation Oncology | Admitting: Radiation Oncology

## 2021-08-19 ENCOUNTER — Encounter (HOSPITAL_COMMUNITY)
Admission: RE | Admit: 2021-08-19 | Discharge: 2021-08-19 | Disposition: A | Discharge status: Home / Self Care | Payer: Medicare Other | Source: Ambulatory Visit | Attending: Hematology and Oncology | Admitting: Hematology and Oncology

## 2021-08-19 VITALS — BP 142/82 | HR 107 | Temp 96.4°F | Resp 18

## 2021-08-19 DIAGNOSIS — C3492 Malignant neoplasm of unspecified part of left bronchus or lung: Secondary | ICD-10-CM | POA: Diagnosis not present

## 2021-08-19 DIAGNOSIS — C50919 Malignant neoplasm of unspecified site of unspecified female breast: Secondary | ICD-10-CM | POA: Insufficient documentation

## 2021-08-19 DIAGNOSIS — C3412 Malignant neoplasm of upper lobe, left bronchus or lung: Secondary | ICD-10-CM | POA: Diagnosis not present

## 2021-08-19 DIAGNOSIS — R918 Other nonspecific abnormal finding of lung field: Secondary | ICD-10-CM | POA: Diagnosis not present

## 2021-08-19 DIAGNOSIS — C7931 Secondary malignant neoplasm of brain: Secondary | ICD-10-CM

## 2021-08-19 DIAGNOSIS — G9389 Other specified disorders of brain: Secondary | ICD-10-CM | POA: Insufficient documentation

## 2021-08-19 DIAGNOSIS — C3491 Malignant neoplasm of unspecified part of right bronchus or lung: Secondary | ICD-10-CM | POA: Diagnosis not present

## 2021-08-19 DIAGNOSIS — C50211 Malignant neoplasm of upper-inner quadrant of right female breast: Secondary | ICD-10-CM | POA: Diagnosis not present

## 2021-08-19 DIAGNOSIS — C3490 Malignant neoplasm of unspecified part of unspecified bronchus or lung: Secondary | ICD-10-CM | POA: Insufficient documentation

## 2021-08-19 DIAGNOSIS — Z79899 Other long term (current) drug therapy: Secondary | ICD-10-CM | POA: Diagnosis not present

## 2021-08-19 DIAGNOSIS — Z5111 Encounter for antineoplastic chemotherapy: Secondary | ICD-10-CM | POA: Diagnosis not present

## 2021-08-19 DIAGNOSIS — Z510 Encounter for antineoplastic radiation therapy: Secondary | ICD-10-CM | POA: Diagnosis not present

## 2021-08-19 DIAGNOSIS — Z171 Estrogen receptor negative status [ER-]: Secondary | ICD-10-CM | POA: Diagnosis not present

## 2021-08-19 LAB — RAD ONC ARIA SESSION SUMMARY
Course Elapsed Days: 3
Plan Fractions Treated to Date: 2
Plan Prescribed Dose Per Fraction: 6 Gy
Plan Total Fractions Prescribed: 2
Plan Total Prescribed Dose: 12 Gy
Reference Point Dosage Given to Date: 24 Gy
Reference Point Session Dosage Given: 6 Gy
Session Number: 4

## 2021-08-19 LAB — GLUCOSE, CAPILLARY: Glucose-Capillary: 181 mg/dL — ABNORMAL HIGH (ref 70–99)

## 2021-08-19 MED ORDER — FLUDEOXYGLUCOSE F - 18 (FDG) INJECTION
9.2400 | Freq: Once | INTRAVENOUS | Status: AC
  Administered 2021-08-19: 9.24 via INTRAVENOUS

## 2021-08-19 NOTE — Progress Notes (Signed)
Jennifer Cowan rested with [name] for 15 minutes following her [place] treatment.  Patient denies headache, dizziness, nausea, diplopia or ringing in the ears. Denies fatigue. Patient without complaints. Understands to avoid strenuous activity for the next 24 hours and call [phone] with needs.  Ambulated to simulation department for next appointment.  Aware of next radiation treatment.  BP (!) 142/82 (BP Location: Left Arm)   Pulse (!) 107   Temp (!) 96.4 F (35.8 C)   Resp 18   SpO2 98%    Rohan Juenger M. Leonie Green, BSN

## 2021-08-20 ENCOUNTER — Ambulatory Visit (HOSPITAL_COMMUNITY)
Admission: RE | Admit: 2021-08-20 | Discharge: 2021-08-20 | Disposition: A | Discharge status: Home / Self Care | Payer: Medicare Other | Source: Ambulatory Visit | Attending: Hematology and Oncology | Admitting: Hematology and Oncology

## 2021-08-20 ENCOUNTER — Other Ambulatory Visit: Payer: Self-pay

## 2021-08-20 ENCOUNTER — Encounter (HOSPITAL_COMMUNITY): Payer: Self-pay

## 2021-08-20 ENCOUNTER — Ambulatory Visit
Admission: RE | Admit: 2021-08-20 | Discharge: 2021-08-20 | Disposition: A | Discharge status: Home / Self Care | Payer: Medicare Other | Source: Ambulatory Visit | Attending: Radiation Oncology | Admitting: Radiation Oncology

## 2021-08-20 ENCOUNTER — Encounter: Payer: Self-pay | Admitting: Urology

## 2021-08-20 ENCOUNTER — Encounter: Payer: Self-pay | Admitting: Radiation Oncology

## 2021-08-20 VITALS — BP 125/72 | HR 96 | Temp 97.6°F | Resp 18

## 2021-08-20 DIAGNOSIS — I10 Essential (primary) hypertension: Secondary | ICD-10-CM | POA: Diagnosis not present

## 2021-08-20 DIAGNOSIS — C7931 Secondary malignant neoplasm of brain: Secondary | ICD-10-CM | POA: Diagnosis not present

## 2021-08-20 DIAGNOSIS — C50911 Malignant neoplasm of unspecified site of right female breast: Secondary | ICD-10-CM | POA: Diagnosis not present

## 2021-08-20 DIAGNOSIS — C50211 Malignant neoplasm of upper-inner quadrant of right female breast: Secondary | ICD-10-CM | POA: Diagnosis not present

## 2021-08-20 DIAGNOSIS — E785 Hyperlipidemia, unspecified: Secondary | ICD-10-CM | POA: Insufficient documentation

## 2021-08-20 DIAGNOSIS — Z510 Encounter for antineoplastic radiation therapy: Secondary | ICD-10-CM | POA: Diagnosis not present

## 2021-08-20 DIAGNOSIS — Z171 Estrogen receptor negative status [ER-]: Secondary | ICD-10-CM | POA: Diagnosis not present

## 2021-08-20 DIAGNOSIS — Z79899 Other long term (current) drug therapy: Secondary | ICD-10-CM | POA: Diagnosis not present

## 2021-08-20 DIAGNOSIS — C3492 Malignant neoplasm of unspecified part of left bronchus or lung: Secondary | ICD-10-CM | POA: Insufficient documentation

## 2021-08-20 DIAGNOSIS — C3412 Malignant neoplasm of upper lobe, left bronchus or lung: Secondary | ICD-10-CM | POA: Diagnosis not present

## 2021-08-20 DIAGNOSIS — C801 Malignant (primary) neoplasm, unspecified: Secondary | ICD-10-CM | POA: Diagnosis not present

## 2021-08-20 DIAGNOSIS — Z452 Encounter for adjustment and management of vascular access device: Secondary | ICD-10-CM | POA: Diagnosis not present

## 2021-08-20 DIAGNOSIS — Z5111 Encounter for antineoplastic chemotherapy: Secondary | ICD-10-CM | POA: Diagnosis not present

## 2021-08-20 HISTORY — PX: IR IMAGING GUIDED PORT INSERTION: IMG5740

## 2021-08-20 LAB — RAD ONC ARIA SESSION SUMMARY
Course Elapsed Days: 4
Plan Fractions Treated to Date: 2
Plan Prescribed Dose Per Fraction: 6 Gy
Plan Total Fractions Prescribed: 2
Plan Total Prescribed Dose: 12 Gy
Reference Point Dosage Given to Date: 30 Gy
Reference Point Session Dosage Given: 6 Gy
Session Number: 5

## 2021-08-20 MED ORDER — FENTANYL CITRATE (PF) 100 MCG/2ML IJ SOLN
INTRAMUSCULAR | Status: AC | PRN
  Administered 2021-08-20 (×2): 50 ug via INTRAVENOUS

## 2021-08-20 MED ORDER — HEPARIN SOD (PORK) LOCK FLUSH 100 UNIT/ML IV SOLN
INTRAVENOUS | Status: AC
  Filled 2021-08-20: qty 5

## 2021-08-20 MED ORDER — FENTANYL CITRATE (PF) 100 MCG/2ML IJ SOLN
INTRAMUSCULAR | Status: AC
  Filled 2021-08-20: qty 2

## 2021-08-20 MED ORDER — SODIUM CHLORIDE 0.9 % IV SOLN: INTRAVENOUS | Status: DC

## 2021-08-20 MED ORDER — MIDAZOLAM HCL 2 MG/2ML IJ SOLN
INTRAMUSCULAR | Status: AC
  Filled 2021-08-20: qty 2

## 2021-08-20 MED ORDER — MIDAZOLAM HCL 2 MG/2ML IJ SOLN
INTRAMUSCULAR | Status: AC | PRN
  Administered 2021-08-20 (×2): 1 mg via INTRAVENOUS

## 2021-08-20 MED ORDER — LIDOCAINE HCL (PF) 1 % IJ SOLN
INTRAMUSCULAR | Status: AC | PRN
  Administered 2021-08-20: 20 mL

## 2021-08-20 MED ORDER — LIDOCAINE HCL 1 % IJ SOLN
INTRAMUSCULAR | Status: AC
  Filled 2021-08-20: qty 20

## 2021-08-20 MED ORDER — LIDOCAINE-EPINEPHRINE 1 %-1:100000 IJ SOLN
INTRAMUSCULAR | Status: AC
  Filled 2021-08-20: qty 1

## 2021-08-20 NOTE — Addendum Note (Signed)
Encounter addended by: Cori Razor, RN on: 08/20/2021 1:48 PM  Actions taken: Flowsheet accepted, Clinical Note Signed

## 2021-08-20 NOTE — H&P (Signed)
Chief Complaint: Patient was seen in consultation today for port-a-catheter placement at the request of Iruku,Praveena  Referring Physician(s): Iruku,Praveena  Supervising Physician: Markus Daft  Patient Status: [facility] - Out-pt  History of Present Illness: Jennifer Cowan is a 76 y.o. female with PMH of anemia, breast cancer, hypertension, and hyperlipidemia being seen today for port-a-catheter placement secondary to recent non-small cell lung cancer diagnosis. Patient had mammogram performed 06/15/21 which revealed a right breast mass that later was found to be high-grade invasive ductal carcinoma. Subsequent CT revealed non-small cell lung cancer with possible metastatic disease to the brain. Patient was referred to IR for port-a-catheter placement.   Past Medical History:  Diagnosis Date   Anemia    Arthritis    Breast cancer ([place])    Cancer ([place]) 1999   Breast Cancer   Hyperlipidemia    Hypertension    Osteopenia 01/12/2021    Past Surgical History:  Procedure Laterality Date   BREAST SURGERY  1999   Masectomy- left   BRONCHIAL BIOPSY  07/30/2021   Procedure: BRONCHIAL BIOPSIES;  Surgeon: Collene Gobble, MD;  Location: [facility] ENDOSCOPY;  Service: Pulmonary;;   BRONCHIAL BRUSHINGS  07/30/2021   Procedure: BRONCHIAL BRUSHINGS;  Surgeon: Collene Gobble, MD;  Location: [facility] ENDOSCOPY;  Service: Pulmonary;;   BRONCHIAL NEEDLE ASPIRATION BIOPSY  07/30/2021   Procedure: BRONCHIAL NEEDLE ASPIRATION BIOPSIES;  Surgeon: Collene Gobble, MD;  Location: [facility] ENDOSCOPY;  Service: Pulmonary;;   CYSTECTOMY     cyst removed off top of head   MASTECTOMY     VIDEO BRONCHOSCOPY WITH RADIAL ENDOBRONCHIAL ULTRASOUND  07/30/2021   Procedure: VIDEO BRONCHOSCOPY WITH RADIAL ENDOBRONCHIAL ULTRASOUND;  Surgeon: Collene Gobble, MD;  Location: [facility] ENDOSCOPY;  Service: Pulmonary;;    Allergies: Patient has no known allergies.  Medications: Prior to Admission medications   Medication Sig Start Date End  Date Taking? Authorizing Provider  acetaminophen (TYLENOL) 500 MG tablet Take 1,000 mg by mouth every 6 (six) hours as needed for moderate pain.   Yes [provider]  amLODipine (NORVASC) 10 MG tablet TAKE 1 TABLET BY MOUTH DAILY 08/04/21  Yes [name], FNP  benazepril (LOTENSIN) 10 MG tablet TAKE 1 TABLET BY MOUTH DAILY 08/04/21  Yes [name], FNP  Cholecalciferol (VITAMIN D3) 125 MCG (5000 UT) CAPS Take 5,000 Units by mouth daily.   Yes [provider]  dexamethasone (DECADRON) 4 MG tablet Take 1 tablet (4 mg total) by mouth 3 (three) times daily. 08/04/21  Yes Hayden Pedro, PA-C  lidocaine-prilocaine (EMLA) cream Apply to affected area once 08/13/21  Yes Iruku, Arletha Pili, MD  LORazepam (ATIVAN) 0.5 MG tablet 1 tab po 30 minutes prior to radiation or MRI scans 08/04/21  Yes Hayden Pedro, PA-C  meloxicam (MOBIC) 7.5 MG tablet TAKE 1 TABLET BY MOUTH DAILY 08/04/21  Yes [name], FNP  simvastatin (ZOCOR) 40 MG tablet TAKE 1 TABLET BY MOUTH DAILY 08/04/21  Yes [name], FNP  ezetimibe (ZETIA) 10 MG tablet Take 1 tablet (10 mg total) by mouth daily. Patient not taking: Reported on 07/24/2021 07/08/21   [name], FNP  ondansetron (ZOFRAN) 8 MG tablet Take 1 tablet (8 mg total) by mouth 2 (two) times daily as needed for refractory nausea / vomiting. Start on day 3 after chemo. 08/13/21   Benay Pike, MD  trolamine salicylate (ASPERCREME) 10 % cream Apply 1 Application topically daily as needed for muscle pain.    [provider]     Family History  Problem Relation Age of Onset   Arthritis Mother    Breast cancer Sister    Cancer Sister        Breast Cancer   Diabetes Sister    Hyperlipidemia Sister    Hypertension Sister    Stroke Sister    Diabetes Brother    Hypertension Brother     Social History   Socioeconomic History   Marital status: Single    Spouse name: Not on file   Number of children: 0    Years of education: 12   Highest education level: High school graduate  Occupational History   Occupation: retired  Tobacco Use   Smoking status: Never   Smokeless tobacco: Never  Scientific laboratory technician Use: Never used  Substance and Sexual Activity   Alcohol use: No   Drug use: Never   Sexual activity: Yes    Birth control/protection: Post-menopausal  Other Topics Concern   Not on file  Social History Narrative   Not on file   Social Determinants of Health   Financial Resource Strain: Not on file  Food Insecurity: Not on file  Transportation Needs: Not on file  Physical Activity: Not on file  Stress: Not on file  Social Connections: Not on file     Review of Systems: A 12 point ROS discussed and pertinent positives are indicated in the HPI above.  All other systems are negative.  Review of Systems  Constitutional:  Negative for chills and fever.  Respiratory:  Negative for chest tightness and shortness of breath.   Cardiovascular:  Negative for chest pain and leg swelling.  Gastrointestinal:  Negative for diarrhea, nausea and vomiting.  Neurological:  Negative for dizziness and headaches.  Psychiatric/Behavioral:  Negative for confusion.     Vital Signs: BP 121/67   Pulse 80   Temp 98.2 F (36.8 C) (Oral)   Resp 16   Ht $R'5\' 8"'wz$  (1.727 m)   Wt 185 lb 6.5 oz (84.1 kg)   SpO2 99%   BMI 28.19 kg/m      Physical Exam Vitals reviewed.  Constitutional:      General: She is not in acute distress. HENT:     Head: Normocephalic.     Mouth/Throat:     Mouth: Mucous membranes are moist.  Cardiovascular:     Rate and Rhythm: Normal rate and regular rhythm.     Pulses: Normal pulses.     Heart sounds: Normal heart sounds.  Pulmonary:     Effort: Pulmonary effort is normal.     Breath sounds: Normal breath sounds.  Abdominal:     General: Bowel sounds are normal.     Palpations: Abdomen is soft.     Tenderness: There is no abdominal tenderness.   Musculoskeletal:     Right lower leg: No edema.     Left lower leg: No edema.  Skin:    General: Skin is warm and dry.  Neurological:     Mental Status: She is alert and oriented to person, place, and time.  Psychiatric:        Mood and Affect: Mood normal.        Behavior: Behavior normal.     Imaging: NM PET Image Restag (PS) Skull Base To Thigh  Result Date: 08/19/2021 CLINICAL DATA:  Initial treatment strategy for non-small cell lung cancer with metastatic brain lesions. Newly diagnosed right breast cancer. EXAM: NUCLEAR MEDICINE PET SKULL  BASE TO THIGH TECHNIQUE: 9.24 mCi F-18 FDG was injected intravenously. Full-ring PET imaging was performed from the skull base to thigh after the radiotracer. CT data was obtained and used for attenuation correction and anatomic localization. Fasting blood glucose: 181 mg/dl COMPARISON:  MRI brain August 06, 2021 and CT chest abdomen and pelvis July 24, 2021 FINDINGS: Mediastinal blood pool activity: SUV max 2.8 Liver activity: SUV max NA NECK: No hypermetabolic cervical adenopathy Incidental CT findings: none CHEST: Hypermetabolic cavitating left upper lobe pulmonary mass measures 3.1 cm on image 49/4 with a max SUV of 12.2. Hypermetabolic mediastinal and left hilar adenopathy. For reference: -hypermetabolic AP window lymph node measures 15 mm on image 59/4 with a max SUV of 06.2. -hypermetabolic prevascular lymph node measures 13 mm on image 50/4 with a max SUV of 8.3. Hypermetabolic lobular right chest mass measures 5.5 x 4.7 cm on image 81/4 with a max SUV of 12.0. Incidental CT findings: Aortic atherosclerosis. Prior left mastectomy. ABDOMEN/PELVIS: No abnormal hypermetabolic activity within the liver, pancreas, adrenal glands, or spleen. No hypermetabolic lymph nodes in the abdomen or pelvis. Incidental CT findings: Non hypermetabolic hypodense hepatic cysts measure up to 4.8 cm. Bilateral renal calculi measure up to 2.1 cm in the right kidney and 9 mm in  the left kidney with prominent cortical scarring in the right upper pole kidney overlying the large renal stone. 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 Additional bilateral subcentimeter hypodense renal lesions are technically too small to accurately characterize but statistically likely to reflect cysts and require no individual imaging follow-up. Bilateral renal sinus cysts are considered benign requiring no follow-up. Lobular uterine contour likely reflects leiomyomas. SKELETON: No focal hypermetabolic activity to suggest skeletal metastasis. Incidental CT findings: Multilevel degenerative changes spine. IMPRESSION: 1. Hypermetabolic partially cavitary left upper lobe pulmonary mass is consistent with known primary bronchogenic neoplasm. 2. Hypermetabolic mediastinal and left hilar lymph nodes are consistent with nodal disease involvement. 3. Hypermetabolic right breast mass is consistent with the patient's known primary breast neoplasm. 4. No evidence of distant hypermetabolic metastatic disease in the neck, chest, abdomen or pelvis. 5. Bilateral nonobstructive renal calculi measuring up to 21 mm. 6.  Aortic Atherosclerosis (ICD10-I70.0). Electronically Signed   By: Dahlia Bailiff M.D.   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 Metastatic disease evaluation; 3T SRS Protocol for radiation treatment planning. EXAM: MRI HEAD WITHOUT AND WITH CONTRAST TECHNIQUE: Multiplanar, multiecho pulse sequences of the brain and surrounding structures were obtained without and with intravenous contrast. CONTRAST:  58mL MULTIHANCE GADOBENATE DIMEGLUMINE 529 MG/ML IV SOLN COMPARISON:  MRI of the brain July 24, 2021. FINDINGS: Postcontrast images are partially degraded by motion. Brain: No acute infarction, acute hemorrhage hemorrhage, hydrocephalus or extra-axial collection. Six  enhancing lesions concerning for metastatic deposits are identified: - left parafalcine lesion, solid with microcystic areas, may represent intraparenchymal lesion extending to the cortex versus extra-axial lesion invading the parenchyma, 36 x 29 x 27 mm, with prominent surrounding vasogenic edema in the left frontal lobe and corpus callosum, series 11, image 123; - right temporal lobe, predominantly necrotic with irregular peripheral contrast enhancement, 54 x 42 x 39 mm, with prominent surrounding vasogenic edema extending into the right internal capsule and external capsule, series 11, image 78; - left temporal lobe, 3 mm, series 11, image 69; - left occipital lobe, 2 mm, series 11, image  64; - right temporal lobe just anterior to the larger lesion, 5 mm, series 11, image 63; - left occipital lobe seen only on coronal postcontrast images, 2 mm, series 12, image 4. Mass effect related to the right temporal lobe dominant lesion resulting in approximately 2 mm leftward midline shift. 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 Small amount of scattered nonspecific T2 hyperintense lesions of the white matter, likely represent chronic microangiopathy. Vascular: Normal flow voids. Skull and upper cervical spine: Normal marrow signal. Sinuses/Orbits: Negative. Other: None. IMPRESSION: Six enhancing lesions identified, consistent with metastatic disease to the brain, as detailed above. Significant mass effect related to the right temporal and left frontal lesions with surrounding vasogenic edema. A 2 mm leftward midline shift, improved from 4 mm on prior MRI. Electronically Signed   By: Pedro Earls M.D.   On: 08/06/2021 17:05   DG Chest Port 1 View  Result Date: 07/30/2021 CLINICAL DATA:  Left-sided bronchoscopy. EXAM: PORTABLE CHEST 1 VIEW COMPARISON:  CT chest dated July 24, 2021. FINDINGS: The heart size and mediastinal contours are within normal limits. Normal pulmonary vascularity.  Unchanged 4.3 cm mass in the left upper lobe. No focal consolidation, pleural effusion, or pneumothorax. No acute osseous abnormality. IMPRESSION: 1. No active disease. 2. Unchanged left upper lobe mass. Electronically Signed   By: Titus Dubin M.D.   On: 07/30/2021 09:02   DG C-ARM BRONCHOSCOPY  Result Date: 07/30/2021 C-ARM BRONCHOSCOPY: Fluoroscopy was utilized by the requesting physician.  No radiographic interpretation.   [name] RT BREAST BX W LOC DEV 1ST LESION IMG BX SPEC US GUIDE  Result Date: 07/26/2021 CLINICAL DATA:  76 year old inpatient with metastatic disease, unknown primary, breast suspected. History of left breast cancer status post mastectomy. EXAM: ULTRASOUND GUIDED RIGHT BREAST CORE NEEDLE BIOPSY COMPARISON:  Previous exam(s). PROCEDURE: I met with the patient and we discussed the procedure of ultrasound-guided biopsy, including benefits and alternatives. We discussed the high likelihood of a successful procedure. We discussed the risks of the procedure, including infection, bleeding, tissue injury, clip migration, and inadequate sampling. Informed written consent was given. The usual time-out protocol was performed immediately prior to the procedure. Lesion quadrant: Upper inner quadrant Using sterile technique and 1% Lidocaine as local anesthetic, under direct ultrasound visualization, a 12 gauge spring-loaded device was used to perform biopsy of a mass in the retroareolar right breast using a lateral approach. At the conclusion of the procedure a coil shaped tissue marker clip was deployed into the biopsy cavity. Follow up 2 view mammogram was performed and dictated separately. IMPRESSION: Ultrasound guided biopsy of a mass in the retroareolar right breast. No apparent complications. Electronically Signed   By: Audie Pinto M.D.   On: 07/26/2021 16:06   MR BRAIN W WO CONTRAST  Result Date: 07/24/2021 CLINICAL DATA:  Brain metastases EXAM: MRI HEAD WITHOUT AND WITH CONTRAST  TECHNIQUE: Multiplanar, multiecho pulse sequences of the brain and surrounding structures were obtained without and with intravenous contrast. CONTRAST:  8.67mL GADAVIST GADOBUTROL 1 MMOL/ML IV SOLN COMPARISON:  None Available. FINDINGS: Brain: There is a large, partially necrotic intraparenchymal mass in the right temporal lobe measuring 4.4 x 3.7 cm. There is severe surrounding hyperintense T2-weighted signal. There is a heterogeneous intraparenchymal mass within the paramedian left frontal lobe measuring 3.0 x 2.6 cm, also a large amount of surrounding edema. There are 5 mm enhancing nodules in the anterior right temporal lobe and in the left temporal lobe. There is bilateral mass effect  without herniation. No hydrocephalus. There is petechial hemorrhage within both masses. Vascular: Major flow voids are preserved. Skull and upper cervical spine: Normal calvarium and skull base. Visualized upper cervical spine and soft tissues are normal. Sinuses/Orbits:No paranasal sinus fluid levels or advanced mucosal thickening. No mastoid or middle ear effusion. Normal orbits. IMPRESSION: 1. Multiple intracranial metastases, including 2 large lesions of the left frontal and right temporal lobes with severe surrounding edema. 2. Bilateral mass effect without herniation. Electronically Signed   By: Ulyses Jarred M.D.   On: 07/24/2021 21:19   CT CHEST ABDOMEN PELVIS W CONTRAST  Result Date: 07/24/2021 CLINICAL DATA:  Brain metastases, unknown primary EXAM: CT CHEST, ABDOMEN, AND PELVIS WITH CONTRAST TECHNIQUE: Multidetector CT imaging of the chest, abdomen and pelvis was performed following the standard protocol during bolus administration of intravenous contrast. RADIATION DOSE REDUCTION: This exam was performed according to the departmental dose-optimization program which includes automated exposure control, adjustment of the mA and/or kV according to patient size and/or use of iterative reconstruction technique. CONTRAST:   30mL OMNIPAQUE IOHEXOL 300 MG/ML  SOLN COMPARISON:  None Available. FINDINGS: CT CHEST FINDINGS Cardiovascular: Heart is normal size. Scattered coronary artery and aortic calcifications. Aorta normal caliber. Mediastinum/Nodes: Pre-vascular and left AP window adenopathy noted which appears low-density/necrotic. These nodes measure up to 1.8 cm in short axis diameter. Left hilar adenopathy with a short axis diameter of 11 mm. No axillary adenopathy. Lungs/Pleura: Left upper lobe mass noted measuring up to 3.6 cm on image 50 of series 4. This is concerning for lung primary. No additional lung nodules or masses. No effusions. Musculoskeletal: Necrotic appearing lobulated mass in the right breast measures 6 x 5 cm concerning for breast cancer. Prior left mastectomy. No acute bony abnormality. CT ABDOMEN PELVIS FINDINGS Hepatobiliary: Low-density lesions within the liver, favor cysts. The largest measures up to 4.9 cm. Gallbladder unremarkable. Pancreas: No focal abnormality or ductal dilatation. Spleen: No focal abnormality.  Normal size. Adrenals/Urinary Tract: Contrast material seen within the renal collecting systems bilaterally from earlier imaging. Probable nonobstructing stones in the upper pole of the left kidney although this could represent contrast material rather than stones. Overlying scarring and cortical thinning. No hydronephrosis. Adrenal glands and urinary bladder unremarkable. Stomach/Bowel: Normal appendix. Stomach, large and small bowel grossly unremarkable. Vascular/Lymphatic: Aortic atherosclerosis. No evidence of aneurysm or adenopathy. Reproductive: Multiple lesions within the uterus most compatible with fibroids measuring up to 5.4 cm. No adnexal masses. Other: No free fluid or free air. Musculoskeletal: No acute bony abnormality. IMPRESSION: 6 x 5 cm necrotic appearing mass within the right breast concerning for breast cancer. 3.6 cm necrotic appearing mass in the left upper lobe. This could  reflect primary lung cancer or metastasis. Prevascular, AP window and left hilar adenopathy. Low-density lesions in the liver appear to be simple cysts. Aortic atherosclerosis. Fibroid uterus. No acute findings in the abdomen or pelvis. Electronically Signed   By: Rolm Baptise M.D.   On: 07/24/2021 21:12   CT HEAD WO CONTRAST  Result Date: 07/24/2021 CLINICAL DATA:  Delirium EXAM: CT HEAD WITHOUT CONTRAST TECHNIQUE: Contiguous axial images were obtained from the base of the skull through the vertex without intravenous contrast. RADIATION DOSE REDUCTION: This exam was performed according to the departmental dose-optimization program which includes automated exposure control, adjustment of the mA and/or kV according to patient size and/or use of iterative reconstruction technique. COMPARISON:  None Available. FINDINGS: Brain: Approximately 2.5 cm mass lesion in the anterior left frontal lobe (series 3,  image 17). Additional probable cystic/necrotic large mass lesion centered in the right parietal and temporal lobes. Extensive edema surrounding both lesions with associated regional mass effect. No definite evidence of acute hemorrhage. There is effacement of the ventricles due to mass effect from the above lesions. No hydrocephalus. No evidence of acute large vascular territory infarct. Vascular: Calcific intracranial atherosclerosis. Skull: No acute fracture. Sinuses/Orbits: Clear visualized sinuses. Other: No mastoid effusions. IMPRESSION: At least 2 large mass lesions in the left frontal and right parietotemporal regions, concerning for metastatic disease but incompletely assessed on this study. Extensive surrounding edema with regional mass effect. Recommend MRI with contrast for confirmation and further evaluation. Electronically Signed   By: Margaretha Sheffield M.D.   On: 07/24/2021 13:58    Labs:  CBC: Recent Labs    01/06/21 1337 07/07/21 1538 07/24/21 1242 07/25/21 0111  WBC 6.2 6.1 5.3 6.7  HGB  15.7 15.7 14.5 14.6  HCT 47.1* 46.6 45.0 44.6  PLT 399 450 450* 400    COAGS: No results for input(s): "INR", "APTT" in the last 8760 hours.  BMP: Recent Labs    01/06/21 1337 07/07/21 1538 07/24/21 1242 07/25/21 0111  NA 141 141 139 140  K 4.7 4.4 3.6 3.5  CL 104 102 108 104  CO2 _0 GLUCOSE 114* 100* 115* 154*  BUN _1 CALCIUM 10.3 10.5* 9.4 9.7  CREATININE 0.70 0.67 0.78 0.66  GFRNONAA  --   --  >60 >60    LIVER FUNCTION TESTS: Recent Labs    01/06/21 1337 07/07/21 1538 07/24/21 1242  BILITOT 0.4 0.4 0.6  AST _2 ALT _3 ALKPHOS 123* 102 71  PROT 8.0 7.9 7.0  ALBUMIN 4.5 4.6 3.6    TUMOR MARKERS: No results for input(s): "AFPTM", "CEA", "CA199", "CHROMGRNA" in the last 8760 hours.  Assessment and Plan:  Jennifer Cowan is a 76 yo female with PMH of anemia, breast cancer, hypertension, and hyperlipidemia being seen today for port-a-catheter placement secondary to recent non-small cell lung cancer diagnosis. She presents today in good spirits and is eager to proceed with the port placement. She will undergo image-guided port-a-catheter placement with Dr Anselm Pancoast on 08/20/21.  Risks and benefits of image guided port-a-catheter placement was discussed with the patient including, but not limited to bleeding, infection, pneumothorax, or fibrin sheath development and need for additional procedures.  All of the patient's questions were answered, patient is agreeable to proceed. Consent signed and in chart.    Thank you for this interesting consult.  I greatly enjoyed meeting Jennifer Cowan and look forward to participating in their care.  A copy of this report was sent to the requesting provider on this date.  Electronically Signed: Lura Em, PA-C 08/20/2021, 8:48 AM   I spent a total of  15 Minutes   in face to face in clinical consultation, greater than 50% of which was counseling/coordinating care for port-a-catheter placement.

## 2021-08-20 NOTE — Procedures (Signed)
Interventional Radiology Procedure:   Indications: Metastatic disease with right breast cancer and left lung cancer  Procedure: Port placement  Findings: Right jugular port, tip at SVC/RA junction  Complications: None     EBL: Minimal, less than 10 ml  Plan: Discharge in one hour.  Keep port site and incisions dry for at least 24 hours.     Dulcie Gammon R. Anselm Pancoast, MD  Pager: [phone]

## 2021-08-20 NOTE — Progress Notes (Signed)
  Radiation Oncology         [phone] ________________________________  Name: Jennifer Cowan  DHW:[id number]  Date of Service: 08/20/2021  DOB: August 20, 1945   Steroid Taper Instructions   You currently have a prescription for Dexamethasone 4 mg Tablets.   Beginning 08/21/21  Take a 4 mg tablet twice a day  Beginning 08/28/21: Take 1/2 of a tablet (which is 2 mg) twice a day  Beginning 09/04/21: Take 1/2 of a tablet (which is 2 mg) once a day  Beginning 09/10/21: Take 1/2 of a tablet (which is 2 mg) every other day and stop on 09/17/21.   Please call our office if you have any headaches, visual changes, uncontrolled movements, extremity weakness, nausea or vomiting.    Nicholos Johns, MMS, PA-C [facility] [place]: [phone]  Fax: [phone]

## 2021-08-20 NOTE — Progress Notes (Signed)
Mrs. Lung rested with [name] for 15 minutes following her [place] treatment.  Patient denies headache, dizziness, nausea, diplopia or ringing in the ears. Denies fatigue. Patient without complaints. Understands to avoid strenuous activity for the next 24 hours and call [phone] with needs.  Steroid taper was given and reviewed with her niece.  Wheeled out of the clinic by family.  Aware of next radiation treatment.  BP 125/72   Pulse 96   Temp 97.6 F (36.4 C) (Oral)   Resp 18   SpO2 99%    Alie Hardgrove M. Leonie Green, BSN

## 2021-08-20 NOTE — Discharge Instructions (Signed)
Please call Interventional Radiology clinic [phone] with any questions or concerns.  You may remove your dressing and shower tomorrow.   Moderate Conscious Sedation, Adult, Care After This sheet gives you information about how to care for yourself after your procedure. Your health care provider may also give you more specific instructions. If you have problems or questions, contact your health careprovider. What can I expect after the procedure? After the procedure, it is common to have: Sleepiness for several hours. Impaired judgment for several hours. Difficulty with balance. Vomiting if you eat too soon. Follow these instructions at home: For the time period you were told by your health care provider: Rest. Do not participate in activities where you could fall or become injured. Do not drive or use machinery. Do not drink alcohol. Do not take sleeping pills or medicines that cause drowsiness. Do not make important decisions or sign legal documents. Do not take care of children on your own. Eating and drinking  Follow the diet recommended by your health care provider. Drink enough fluid to keep your urine pale yellow. If you vomit: Drink water, juice, or soup when you can drink without vomiting. Make sure you have little or no nausea before eating solid foods.  General instructions Take over-the-counter and prescription medicines only as told by your health care provider. Have a responsible adult stay with you for the time you are told. It is important to have someone help care for you until you are awake and alert. Do not smoke. Keep all follow-up visits as told by your health care provider. This is important. Contact a health care provider if: You are still sleepy or having trouble with balance after 24 hours. You feel light-headed. You keep feeling nauseous or you keep vomiting. You develop a rash. You have a fever. You have redness or swelling around the IV  site. Get help right away if: You have trouble breathing. You have new-onset confusion at home. Summary After the procedure, it is common to feel sleepy, have impaired judgment, or feel nauseous if you eat too soon. Rest after you get home. Know the things you should not do after the procedure. Follow the diet recommended by your health care provider and drink enough fluid to keep your urine pale yellow. Get help right away if you have trouble breathing or new-onset confusion at home. This information is not intended to replace advice given to you by your health care provider. Make sure you discuss any questions you have with your healthcare provider. Document Revised: 05/03/2019 Document Reviewed: 11/29/2018 Elsevier Patient Education  2022 [place]. Moderate Conscious Sedation, Adult, Care After This sheet gives you information about how to care for yourself after your procedure. Your health care provider may also give you more specific instructions. If you have problems or questions, contact your health careprovider. What can I expect after the procedure? After the procedure, it is common to have: Sleepiness for several hours. Impaired judgment for several hours. Difficulty with balance. Vomiting if you eat too soon. Follow these instructions at home: For the time period you were told by your health care provider: Rest. Do not participate in activities where you could fall or become injured. Do not drive or use machinery. Do not drink alcohol. Do not take sleeping pills or medicines that cause drowsiness. Do not make important decisions or sign legal documents. Do not take care of children on your own. Eating and drinking  Follow the diet recommended by your health care  provider. Drink enough fluid to keep your urine pale yellow. If you vomit: Drink water, juice, or soup when you can drink without vomiting. Make sure you have little or no nausea before eating solid  foods.  General instructions Take over-the-counter and prescription medicines only as told by your health care provider. Have a responsible adult stay with you for the time you are told. It is important to have someone help care for you until you are awake and alert. Do not smoke. Keep all follow-up visits as told by your health care provider. This is important. Contact a health care provider if: You are still sleepy or having trouble with balance after 24 hours. You feel light-headed. You keep feeling nauseous or you keep vomiting. You develop a rash. You have a fever. You have redness or swelling around the IV site. Get help right away if: You have trouble breathing. You have new-onset confusion at home. Summary After the procedure, it is common to feel sleepy, have impaired judgment, or feel nauseous if you eat too soon. Rest after you get home. Know the things you should not do after the procedure. Follow the diet recommended by your health care provider and drink enough fluid to keep your urine pale yellow. Get help right away if you have trouble breathing or new-onset confusion at home. This information is not intended to replace advice given to you by your health care provider. Make sure you discuss any questions you have with your healthcare provider. Document Revised: 05/03/2019 Document Reviewed: 11/29/2018 Elsevier Patient Education  2022 [employer]Moderate Conscious Sedation, Adult, Care After This sheet gives you information about how to care for yourself after your procedure. Your health care provider may also give you more specific instructions. If you have problems or questions, contact your health careprovider. What can I expect after the procedure? After the procedure, it is common to have: Sleepiness for several hours. Impaired judgment for several hours. Difficulty with balance. Vomiting if you eat too soon. Follow these instructions at home: For the time  period you were told by your health care provider: Rest. Do not participate in activities where you could fall or become injured. Do not drive or use machinery. Do not drink alcohol. Do not take sleeping pills or medicines that cause drowsiness. Do not make important decisions or sign legal documents. Do not take care of children on your own. Eating and drinking  Follow the diet recommended by your health care provider. Drink enough fluid to keep your urine pale yellow. If you vomit: Drink water, juice, or soup when you can drink without vomiting. Make sure you have little or no nausea before eating solid foods.  General instructions Take over-the-counter and prescription medicines only as told by your health care provider. Have a responsible adult stay with you for the time you are told. It is important to have someone help care for you until you are awake and alert. Do not smoke. Keep all follow-up visits as told by your health care provider. This is important. Contact a health care provider if: You are still sleepy or having trouble with balance after 24 hours. You feel light-headed. You keep feeling nauseous or you keep vomiting. You develop a rash. You have a fever. You have redness or swelling around the IV site. Get help right away if: You have trouble breathing. You have new-onset confusion at home. Summary After the procedure, it is common to feel sleepy, have impaired judgment, or feel nauseous if  you eat too soon. Rest after you get home. Know the things you should not do after the procedure. Follow the diet recommended by your health care provider and drink enough fluid to keep your urine pale yellow. Get help right away if you have trouble breathing or new-onset confusion at home. This information is not intended to replace advice given to you by your health care provider. Make sure you discuss any questions you have with your healthcare provider. Document Revised:  05/03/2019 Document Reviewed: 11/29/2018 Elsevier Patient Education  2022 [place].  For questions /concerns may call Interventional Radiology at [phone] or  Interventional Radiology clinic [phone]   You may remove your dressing and shower tomorrow afternoon  DO NOT use EMLA cream for 2 weeks after port placement as the cream will remove surgical glue on your incision.    Implanted Port Insertion, Care After This sheet gives you information about how to care for yourself after your procedure. Your health care provider may also give you more specific instructions. If you have problems or questions, contact your health careprovider. What can I expect after the procedure? After the procedure, it is common to have: Discomfort at the port insertion site. Bruising on the skin over the port. This should improve over 3-4 days. Follow these instructions at home: [facility] care After your port is placed, you will get a manufacturer's information card. The card has information about your port. Keep this card with you at all times. Take care of the port as told by your health care provider. Ask your health care provider if you or a family member can get training for taking care of the port at home. A home health care nurse may also take care of the port. Make sure to remember what type of port you have. Incision care Follow instructions from your health care provider about how to take care of your port insertion site. Make sure you: Wash your hands with soap and water before and after you change your bandage (dressing). If soap and water are not available, use hand sanitizer. Change your dressing as told by your health care provider. Leave skin glue, or adhesive strips in place. These skin closures may need to stay in place for 2 weeks or longer.  Check your port insertion site every day for signs of infection. Check for:      - Redness, swelling, or pain.                     - Fluid or  blood.      - Warmth.      - Pus or a bad smell. Activity Return to your normal activities as told by your health care provider. Ask your health care provider what activities are safe for you. Do not lift anything that is heavier than 10 lb (4.5 kg), or the limit that you are told, until your health care provider says that it is safe. General instructions Take over-the-counter and prescription medicines only as told by your health care provider. Do not take baths, swim, or use a hot tub until your health care provider approves. Ask your health care provider if you may take showers. You may only be allowed to take sponge baths. Do not drive for 24 hours if you were given a sedative during your procedure. Wear a medical alert bracelet in case of an emergency. This will tell any health care providers that you have a port. Keep all follow-up visits  as told by your health care provider. This is important. Contact a health care provider if: You cannot flush your port with saline as directed, or you cannot draw blood from the port. You have a fever or chills. You have redness, swelling, or pain around your port insertion site. You have fluid or blood coming from your port insertion site. Your port insertion site feels warm to the touch. You have pus or a bad smell coming from the port insertion site. Get help right away if: You have chest pain or shortness of breath. You have bleeding from your port that you cannot control. Summary Take care of the port as told by your health care provider. Keep the manufacturer's information card with you at all times. Change your dressing as told by your health care provider. Contact a health care provider if you have a fever or chills or if you have redness, swelling, or pain around your port insertion site. Keep all follow-up visits as told by your health care provider. This information is not intended to replace advice given to you by your health care  provider. Make sure you discuss any questions you have with your healthcare provider. Document Revised: 08/01/2017 Document Reviewed: 08/01/2017

## 2021-08-21 ENCOUNTER — Other Ambulatory Visit: Payer: Self-pay

## 2021-08-23 ENCOUNTER — Inpatient Hospital Stay: Payer: Medicare Other | Attending: Hematology and Oncology

## 2021-08-23 ENCOUNTER — Ambulatory Visit: Payer: Medicare Other | Admitting: Radiation Oncology

## 2021-08-23 ENCOUNTER — Other Ambulatory Visit: Payer: Self-pay

## 2021-08-23 ENCOUNTER — Other Ambulatory Visit: Payer: Self-pay | Admitting: *Deleted

## 2021-08-23 DIAGNOSIS — C3492 Malignant neoplasm of unspecified part of left bronchus or lung: Secondary | ICD-10-CM

## 2021-08-23 MED ORDER — PROCHLORPERAZINE MALEATE 10 MG PO TABS: 10.0000 mg | ORAL_TABLET | Freq: Four times a day (QID) | ORAL | 1 refills | Status: DC | PRN

## 2021-08-23 NOTE — Progress Notes (Signed)
Verbal order from Dr. Chryl Heck: send rx for compazine 10 mg q 6 prn nausea

## 2021-08-23 NOTE — Progress Notes (Signed)
Pharmacist Chemotherapy Monitoring - Initial Assessment    Anticipated start date: 08/30/21   The following has been reviewed per standard work regarding the patient's treatment regimen: The patient's diagnosis, treatment plan and drug doses, and organ/hematologic function Lab orders and baseline tests specific to treatment regimen  The treatment plan start date, drug sequencing, and pre-medications Prior authorization status  Patient's documented medication list, including drug-drug interaction screen and prescriptions for anti-emetics and supportive care specific to the treatment regimen The drug concentrations, fluid compatibility, administration routes, and timing of the medications to be used The patient's access for treatment and lifetime cumulative dose history, if applicable  The patient's medication allergies and previous infusion related reactions, if applicable   Changes made to treatment plan:  N/A  Follow up needed:  N/A   Larene Beach, RPH, 08/23/2021  3:16 PM

## 2021-08-24 ENCOUNTER — Encounter (HOSPITAL_COMMUNITY): Payer: Self-pay

## 2021-08-24 DIAGNOSIS — G9389 Other specified disorders of brain: Secondary | ICD-10-CM | POA: Diagnosis not present

## 2021-08-24 DIAGNOSIS — Z9012 Acquired absence of left breast and nipple: Secondary | ICD-10-CM | POA: Diagnosis not present

## 2021-08-24 DIAGNOSIS — M1990 Unspecified osteoarthritis, unspecified site: Secondary | ICD-10-CM | POA: Diagnosis not present

## 2021-08-24 DIAGNOSIS — E782 Mixed hyperlipidemia: Secondary | ICD-10-CM | POA: Diagnosis not present

## 2021-08-24 DIAGNOSIS — I10 Essential (primary) hypertension: Secondary | ICD-10-CM | POA: Diagnosis not present

## 2021-08-24 DIAGNOSIS — Z791 Long term (current) use of non-steroidal anti-inflammatories (NSAID): Secondary | ICD-10-CM | POA: Diagnosis not present

## 2021-08-24 DIAGNOSIS — M8580 Other specified disorders of bone density and structure, unspecified site: Secondary | ICD-10-CM | POA: Diagnosis not present

## 2021-08-24 DIAGNOSIS — D509 Iron deficiency anemia, unspecified: Secondary | ICD-10-CM | POA: Diagnosis not present

## 2021-08-24 DIAGNOSIS — Z853 Personal history of malignant neoplasm of breast: Secondary | ICD-10-CM | POA: Diagnosis not present

## 2021-08-24 DIAGNOSIS — Z9181 History of falling: Secondary | ICD-10-CM | POA: Diagnosis not present

## 2021-08-25 ENCOUNTER — Ambulatory Visit: Payer: Medicare Other | Admitting: Radiation Oncology

## 2021-08-25 DIAGNOSIS — C50919 Malignant neoplasm of unspecified site of unspecified female breast: Secondary | ICD-10-CM | POA: Diagnosis not present

## 2021-08-26 ENCOUNTER — Encounter (HOSPITAL_COMMUNITY): Payer: Self-pay | Admitting: Hematology and Oncology

## 2021-08-26 DIAGNOSIS — M1990 Unspecified osteoarthritis, unspecified site: Secondary | ICD-10-CM | POA: Diagnosis not present

## 2021-08-26 DIAGNOSIS — E782 Mixed hyperlipidemia: Secondary | ICD-10-CM | POA: Diagnosis not present

## 2021-08-26 DIAGNOSIS — G9389 Other specified disorders of brain: Secondary | ICD-10-CM | POA: Diagnosis not present

## 2021-08-26 DIAGNOSIS — Z9012 Acquired absence of left breast and nipple: Secondary | ICD-10-CM | POA: Diagnosis not present

## 2021-08-26 DIAGNOSIS — Z853 Personal history of malignant neoplasm of breast: Secondary | ICD-10-CM | POA: Diagnosis not present

## 2021-08-26 DIAGNOSIS — M8580 Other specified disorders of bone density and structure, unspecified site: Secondary | ICD-10-CM | POA: Diagnosis not present

## 2021-08-26 DIAGNOSIS — Z9181 History of falling: Secondary | ICD-10-CM | POA: Diagnosis not present

## 2021-08-26 DIAGNOSIS — D509 Iron deficiency anemia, unspecified: Secondary | ICD-10-CM | POA: Diagnosis not present

## 2021-08-26 DIAGNOSIS — Z791 Long term (current) use of non-steroidal anti-inflammatories (NSAID): Secondary | ICD-10-CM | POA: Diagnosis not present

## 2021-08-26 DIAGNOSIS — I10 Essential (primary) hypertension: Secondary | ICD-10-CM | POA: Diagnosis not present

## 2021-08-27 ENCOUNTER — Other Ambulatory Visit: Payer: Self-pay

## 2021-08-27 ENCOUNTER — Inpatient Hospital Stay (HOSPITAL_BASED_OUTPATIENT_CLINIC_OR_DEPARTMENT_OTHER): Payer: Medicare Other | Admitting: Hematology and Oncology

## 2021-08-27 ENCOUNTER — Ambulatory Visit
Admission: RE | Admit: 2021-08-27 | Discharge: 2021-08-27 | Disposition: A | Discharge status: Home / Self Care | Payer: Medicare Other | Source: Ambulatory Visit | Attending: Radiation Oncology | Admitting: Radiation Oncology

## 2021-08-27 ENCOUNTER — Encounter: Payer: Self-pay | Admitting: Hematology and Oncology

## 2021-08-27 DIAGNOSIS — C3490 Malignant neoplasm of unspecified part of unspecified bronchus or lung: Secondary | ICD-10-CM

## 2021-08-27 DIAGNOSIS — Z171 Estrogen receptor negative status [ER-]: Secondary | ICD-10-CM | POA: Diagnosis not present

## 2021-08-27 DIAGNOSIS — Z5111 Encounter for antineoplastic chemotherapy: Secondary | ICD-10-CM | POA: Diagnosis not present

## 2021-08-27 DIAGNOSIS — C50211 Malignant neoplasm of upper-inner quadrant of right female breast: Secondary | ICD-10-CM | POA: Diagnosis not present

## 2021-08-27 DIAGNOSIS — Z510 Encounter for antineoplastic radiation therapy: Secondary | ICD-10-CM | POA: Diagnosis not present

## 2021-08-27 DIAGNOSIS — C50919 Malignant neoplasm of unspecified site of unspecified female breast: Secondary | ICD-10-CM | POA: Diagnosis not present

## 2021-08-27 DIAGNOSIS — C7931 Secondary malignant neoplasm of brain: Secondary | ICD-10-CM | POA: Diagnosis not present

## 2021-08-27 DIAGNOSIS — Z79899 Other long term (current) drug therapy: Secondary | ICD-10-CM | POA: Diagnosis not present

## 2021-08-27 DIAGNOSIS — C3412 Malignant neoplasm of upper lobe, left bronchus or lung: Secondary | ICD-10-CM | POA: Diagnosis not present

## 2021-08-27 MED FILL — Dexamethasone Sodium Phosphate Inj 100 MG/10ML: INTRAMUSCULAR | Qty: 1 | Status: AC

## 2021-08-27 NOTE — Assessment & Plan Note (Addendum)
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  She also appears to have had some regional adenopathy including prevascular, AP window and left hilar adenopathy.  She has multiple intracranial metastatic lesions which are likely secondary to metastatic lung cancer given presence of regional adenopathy.  She is now status post SRS to the brain lesions.  She will start concurrent chemotherapy radiation with weekly CarboTaxol on 8/14.  After completion of concurrent chemotherapy radiation will consider adjuvant immunotherapy. 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  Will also monitor the breast lesion while she is on this regimen. Foundation 1 from the lung specimen is currently pending.  PD-L1 done locally showed a CPS score of 0 from the lung specimen

## 2021-08-27 NOTE — Progress Notes (Signed)
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  She also appears to have had some regional adenopathy including prevascular, AP window and left hilar adenopathy.  She has multiple intracranial metastatic lesions which are likely secondary to metastatic lung cancer given presence of regional adenopathy.  She is now status post SRS to the brain lesions.  She will start concurrent chemotherapy radiation with weekly CarboTaxol on 8/14.  After completion of concurrent chemotherapy radiation will consider adjuvant immunotherapy. 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  Will also monitor the breast lesion while she is on this regimen. Foundation 1 from the lung specimen is currently pending.  PD-L1 done locally showed a CPS score of 0 from the lung specimen  Metastasis to brain [facility]) This is highly concerning for metastatic disease from a primary lung cancer.  She is now status post SRS.  She will continue to follow-up with radiation oncology.  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  There is no evidence of axillary lymphadenopathy on imaging. We have discussed her in the breast [place].  It appears that she may have locally advanced breast cancer without any involvement of axillary lymph  nodes or other metastatic disease related to breast cancer.  Since CarboTaxol is also active in breast cancer, I will continue to monitor her breast mass while she is on CarboTaxol concurrent with radiation for her non-small cell lung cancer. Foundation 1 testing on the breast specimen with microsatellite status, stable, TMB 4 mutations per MB, some other genomic findings but no therapeutic implications.  PD-L1 showed a CPS score of 10 from the breast specimen  No orders of the defined types were placed in this encounter.  Once she is done with acute treatment, she will need genetic testing.  HISTORY OF PRESENTING ILLNESS:   Jennifer Cowan 76 y.o. 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  I saw Jennifer Cowan for the first time while she was hospitalized with altered mental status.  At that time limited history was obtained however the rest was supplemented by the chart review.  It appears that patient had progressive functional decline for at least 2 to 3 weeks before the hospitalization.  Initially family thought it could be a urinary tract infection but then went she became very confused, she was brought to the hospital.  She also had a recent screening mammogram which showed a right breast mass and further evaluation was recommended but before further evaluation can be done she was hospitalized.  During hospitalization she had an MR brain imaging done on July 8 which showed multiple intracranial metastasis including 2 large lesions of the left frontal and right temporal lobes with severe surrounding edema.  Bilateral mass effect without herniation.  CT chest abdomen pelvis with contrast  showed 6 x 5 cm necrotic appearing mass within the right breast concerning for breast cancer.  No axillary adenopathy was noted.  3.6 cm necrotic appearing mass in the left upper lobe.  This could reflect primary  lung cancer or metastasis.  Prevascular, AP window and left hilar adenopathy noted.  Low-density lesions in the liver appear to be simple cyst.  Pathology from breast biopsy showed invasive ductal carcinoma, grade 3 prognostic panel showed ER/PR and HER2 negative Ki-67 was 90%.  Cytology of the lung left upper lobe mass showed malignant cells consistent with non-small cell carcinoma carcinoma is poorly differentiated and is positive for cytokeratin and has a high Ki-67 mitotic index.  The tumor is nonreactive for p40 and TTF-1 and also nonreactive for the breast markers.  Weak CD56 reactivity was noted.    Family also reports remote history of breast cancer on the left side when she underwent left mastectomy that was about 20 years ago did not need any adjuvant chemo, radiation or antiestrogen therapy.  Interval history  She completed SRS on 08/20/2021. She is just feeling tired. She still continues to have memory issues She is in a wheelchair with her sister and niece. She is still independent with most of her activities but needs help.  Sister and niece are very involved.  She denies any pain for me today.  Rest of the pertinent 10 point ROS reviewed and negative.  MEDICAL HISTORY:  Past Medical History:  Diagnosis Date   Anemia    Arthritis    Breast cancer ([place])    Cancer ([place]) 1999   Breast Cancer   Hyperlipidemia    Hypertension    Osteopenia 01/12/2021    SURGICAL HISTORY: Past Surgical History:  Procedure Laterality Date   BREAST SURGERY  1999   Masectomy- left   BRONCHIAL BIOPSY  07/30/2021   Procedure: BRONCHIAL BIOPSIES;  Surgeon: Collene Gobble, MD;  Location: [facility] ENDOSCOPY;  Service: Pulmonary;;   BRONCHIAL BRUSHINGS  07/30/2021   Procedure: BRONCHIAL BRUSHINGS;  Surgeon: Collene Gobble, MD;  Location: [facility] ENDOSCOPY;  Service: Pulmonary;;   BRONCHIAL NEEDLE ASPIRATION BIOPSY  07/30/2021   Procedure: BRONCHIAL NEEDLE ASPIRATION BIOPSIES;  Surgeon: Collene Gobble, MD;   Location: [facility] ENDOSCOPY;  Service: Pulmonary;;   CYSTECTOMY     cyst removed off top of head   IR IMAGING GUIDED PORT INSERTION  08/20/2021   MASTECTOMY     VIDEO BRONCHOSCOPY WITH RADIAL ENDOBRONCHIAL ULTRASOUND  07/30/2021   Procedure: VIDEO BRONCHOSCOPY WITH RADIAL ENDOBRONCHIAL ULTRASOUND;  Surgeon: Collene Gobble, MD;  Location: [facility] ENDOSCOPY;  Service: Pulmonary;;    SOCIAL HISTORY: Social History   Socioeconomic History   Marital status: Single    Spouse name: Not on file   Number of children: 0   Years of education: 12   Highest education level: High school graduate  Occupational History   Occupation: retired  Tobacco Use   Smoking status: Never   Smokeless tobacco: Never  Vaping Use   Vaping Use: Never used  Substance and Sexual Activity   Alcohol use: No   Drug use: Never   Sexual activity: Yes    Birth control/protection: Post-menopausal  Other Topics Concern   Not on file  Social History Narrative   Not on file   Social Determinants of Health   Financial Resource Strain: Not on file  Food Insecurity: Not on file  Transportation Needs: Not on file  Physical Activity: Not on file  Stress: Not on  file  Social Connections: Not on file  Intimate Partner Violence: Not on file    FAMILY HISTORY: Family History  Problem Relation Age of Onset   Arthritis Mother    Breast cancer Sister    Cancer Sister        Breast Cancer   Diabetes Sister    Hyperlipidemia Sister    Hypertension Sister    Stroke Sister    Diabetes Brother    Hypertension Brother     ALLERGIES:  has No Known Allergies.  MEDICATIONS:  Current Outpatient Medications  Medication Sig Dispense Refill   acetaminophen (TYLENOL) 500 MG tablet Take 1,000 mg by mouth every 6 (six) hours as needed for moderate pain.     amLODipine (NORVASC) 10 MG tablet TAKE 1 TABLET BY MOUTH DAILY 90 tablet 3   benazepril (LOTENSIN) 10 MG tablet TAKE 1 TABLET BY MOUTH DAILY 90 tablet 3   Cholecalciferol  (VITAMIN D3) 125 MCG (5000 UT) CAPS Take 5,000 Units by mouth daily.     dexamethasone (DECADRON) 4 MG tablet Take 1 tablet (4 mg total) by mouth 3 (three) times daily. 90 tablet 1   ezetimibe (ZETIA) 10 MG tablet Take 1 tablet (10 mg total) by mouth daily. (Patient not taking: Reported on 07/24/2021) 90 tablet 3   lidocaine-prilocaine (EMLA) cream Apply to affected area once 30 g 3   LORazepam (ATIVAN) 0.5 MG tablet 1 tab po 30 minutes prior to radiation or MRI scans 30 tablet 0   meloxicam (MOBIC) 7.5 MG tablet TAKE 1 TABLET BY MOUTH DAILY 90 tablet 3   ondansetron (ZOFRAN) 8 MG tablet Take 1 tablet (8 mg total) by mouth 2 (two) times daily as needed for refractory nausea / vomiting. Start on day 3 after chemo. 30 tablet 1   prochlorperazine (COMPAZINE) 10 MG tablet Take 1 tablet (10 mg total) by mouth every 6 (six) hours as needed for nausea or vomiting. 30 tablet 1   simvastatin (ZOCOR) 40 MG tablet TAKE 1 TABLET BY MOUTH DAILY 90 tablet 3   trolamine salicylate (ASPERCREME) 10 % cream Apply 1 Application topically daily as needed for muscle pain.     No current facility-administered medications for this visit.     PHYSICAL EXAMINATION: ECOG PERFORMANCE STATUS: 0 - Asymptomatic  Vitals:   08/27/21 0959  BP: 114/61  Pulse: (!) 104  Resp: 18  Temp: (!) 97.5 F (36.4 C)  SpO2: 99%   Filed Weights   08/27/21 0959  Weight: 175 lb 4.8 oz (79.5 kg)    Physical Exam Constitutional:      Appearance: Normal appearance.  Cardiovascular:     Rate and Rhythm: Normal rate and regular rhythm.  Pulmonary:     Effort: Pulmonary effort is normal.     Breath sounds: Normal breath sounds.  Chest:     Comments: Right breast mass appears to be the same as last visit. Musculoskeletal:        General: No swelling.     Cervical back: Normal range of motion and neck supple. No rigidity.  Lymphadenopathy:     Cervical: No cervical adenopathy.  Skin:    General: Skin is warm and dry.   Neurological:     Mental Status: She is alert.       LABORATORY DATA:  I have reviewed the data as listed Lab Results  Component Value Date   WBC 6.7 07/25/2021   HGB 14.6 07/25/2021   HCT 44.6 07/25/2021   MCV 89.0  07/25/2021   PLT 400 07/25/2021     Chemistry      Component Value Date/Time   NA 140 07/25/2021 0111   NA 141 07/07/2021 1538   K 3.5 07/25/2021 0111   CL 104 07/25/2021 0111   CO2 22 07/25/2021 0111   BUN 9 07/25/2021 0111   BUN 9 07/07/2021 1538   CREATININE 0.66 07/25/2021 0111      Component Value Date/Time   CALCIUM 9.7 07/25/2021 0111   ALKPHOS 71 07/24/2021 1242   AST 19 07/24/2021 1242   ALT 18 07/24/2021 1242   BILITOT 0.6 07/24/2021 1242   BILITOT 0.4 07/07/2021 1538       RADIOGRAPHIC STUDIES: I have personally reviewed the radiological images as listed and agreed with the findings in the report. IR IMAGING GUIDED PORT INSERTION  Result Date: 08/20/2021 INDICATION: 76 year old with metastatic brain lesions. The patient has a right breast cancer and non-small cell left lung cancer. Port-A-Cath needed for treatment. EXAM: FLUOROSCOPIC AND ULTRASOUND GUIDED PLACEMENT OF A SUBCUTANEOUS PORT COMPARISON:  None Available. MEDICATIONS: Moderate sedation ANESTHESIA/SEDATION: Moderate (conscious) sedation was employed during this procedure. A total of Versed 2.0 mg and fentanyl 100 mcg was administered intravenously at the order of the provider performing the procedure. Total intra-service moderate sedation time: 22 minutes. Patient's level of consciousness and vital signs were monitored continuously by radiology nurse throughout the procedure under the supervision of the provider performing the procedure. FLUOROSCOPY TIME:  Radiation Exposure Index (as provided by the fluoroscopic device): 1 mGy [place] COMPLICATIONS: None immediate. PROCEDURE: The procedure, risks, benefits, and alternatives were explained to the patient. Questions regarding the procedure  were encouraged and answered. The patient understands and consents to the procedure. Patient was placed supine on the interventional table. Ultrasound confirmed a patent right internal jugular vein. Ultrasound image was saved for documentation. The right chest and neck were cleaned with a skin antiseptic and a sterile drape was placed. Maximal barrier sterile technique was utilized including caps, mask, sterile gowns, sterile gloves, sterile drape, hand hygiene and skin antiseptic. The right neck was anesthetized with 1% lidocaine. Small incision was made in the right neck with a blade. Micropuncture set was placed in the right internal jugular vein with ultrasound guidance. The micropuncture wire was used for measurement purposes. The right chest was anesthetized with 1% lidocaine with epinephrine. #15 blade was used to make an incision and a subcutaneous port pocket was formed. [place] was assembled. Subcutaneous tunnel was formed with a stiff tunneling device. The port catheter was brought through the subcutaneous tunnel. The port was placed in the subcutaneous pocket. The micropuncture set was exchanged for a peel-away sheath. The catheter was placed through the peel-away sheath and the tip was positioned at the superior cavoatrial junction. Catheter placement was confirmed with fluoroscopy. The port was accessed and flushed with heparinized saline. The port pocket was closed using two layers of absorbable sutures and Dermabond. The vein skin site was closed using a single layer of absorbable suture and Dermabond. Sterile dressings were applied. Patient tolerated the procedure well without an immediate complication. Ultrasound and fluoroscopic images were taken and saved for this procedure. IMPRESSION: Placement of a subcutaneous power-injectable port device. Catheter tip at the superior cavoatrial junction. Electronically Signed   By: Markus Daft M.D.   On: 08/20/2021 13:42   NM PET Image Restag  (PS) Skull Base To Thigh  Result Date: 08/19/2021 CLINICAL DATA:  Initial treatment strategy for non-small cell lung  cancer with metastatic brain lesions. Newly diagnosed right breast cancer. EXAM: NUCLEAR MEDICINE PET SKULL BASE TO THIGH TECHNIQUE: 9.24 mCi F-18 FDG was injected intravenously. Full-ring PET imaging was performed from the skull base to thigh after the radiotracer. CT data was obtained and used for attenuation correction and anatomic localization. Fasting blood glucose: 181 mg/dl COMPARISON:  MRI brain August 06, 2021 and CT chest abdomen and pelvis July 24, 2021 FINDINGS: Mediastinal blood pool activity: SUV max 2.8 Liver activity: SUV max NA NECK: No hypermetabolic cervical adenopathy Incidental CT findings: none CHEST: Hypermetabolic cavitating left upper lobe pulmonary mass measures 3.1 cm on image 49/4 with a max SUV of 12.2. Hypermetabolic mediastinal and left hilar adenopathy. For reference: -hypermetabolic AP window lymph node measures 15 mm on image 59/4 with a max SUV of 78.2. -hypermetabolic prevascular lymph node measures 13 mm on image 50/4 with a max SUV of 8.3. Hypermetabolic lobular right chest mass measures 5.5 x 4.7 cm on image 81/4 with a max SUV of 12.0. Incidental CT findings: Aortic atherosclerosis. Prior left mastectomy. ABDOMEN/PELVIS: No abnormal hypermetabolic activity within the liver, pancreas, adrenal glands, or spleen. No hypermetabolic lymph nodes in the abdomen or pelvis. Incidental CT findings: Non hypermetabolic hypodense hepatic cysts measure up to 4.8 cm. Bilateral renal calculi measure up to 2.1 cm in the right kidney and 9 mm in the left kidney with prominent cortical scarring in the right upper pole kidney overlying the large renal stone. 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 Additional bilateral subcentimeter hypodense renal lesions are technically too small to accurately characterize but statistically likely  to reflect cysts and require no individual imaging follow-up. Bilateral renal sinus cysts are considered benign requiring no follow-up. Lobular uterine contour likely reflects leiomyomas. SKELETON: No focal hypermetabolic activity to suggest skeletal metastasis. Incidental CT findings: Multilevel degenerative changes spine. IMPRESSION: 1. Hypermetabolic partially cavitary left upper lobe pulmonary mass is consistent with known primary bronchogenic neoplasm. 2. Hypermetabolic mediastinal and left hilar lymph nodes are consistent with nodal disease involvement. 3. Hypermetabolic right breast mass is consistent with the patient's known primary breast neoplasm. 4. No evidence of distant hypermetabolic metastatic disease in the neck, chest, abdomen or pelvis. 5. Bilateral nonobstructive renal calculi measuring up to 21 mm. 6.  Aortic Atherosclerosis (ICD10-I70.0). Electronically Signed   By: Dahlia Bailiff M.D.   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 Metastatic disease evaluation; 3T SRS Protocol for radiation treatment planning. EXAM: MRI HEAD WITHOUT AND WITH CONTRAST TECHNIQUE: Multiplanar, multiecho pulse sequences of the brain and surrounding structures were obtained without and with intravenous contrast. CONTRAST:  68m MULTIHANCE GADOBENATE DIMEGLUMINE 529 MG/ML IV SOLN COMPARISON:  MRI of the brain July 24, 2021. FINDINGS: Postcontrast images are partially degraded by motion. Brain: No acute infarction, acute hemorrhage hemorrhage, hydrocephalus or extra-axial collection. Six enhancing lesions concerning for metastatic deposits are identified: - left parafalcine lesion, solid with microcystic areas, may represent intraparenchymal lesion extending to the cortex versus extra-axial lesion invading the parenchyma, 36 x 29 x 27 mm, with prominent surrounding vasogenic edema in the left frontal lobe and corpus callosum,  series 11, image 123; - right temporal lobe, predominantly necrotic with irregular peripheral contrast enhancement, 54 x 42 x 39 mm, with prominent surrounding vasogenic edema extending into the right internal capsule and external capsule, series 11, image 78; - left temporal lobe,  3 mm, series 11, image 69; - left occipital lobe, 2 mm, series 11, image 64; - right temporal lobe just anterior to the larger lesion, 5 mm, series 11, image 63; - left occipital lobe seen only on coronal postcontrast images, 2 mm, series 12, image 4. Mass effect related to the right temporal lobe dominant lesion resulting in approximately 2 mm leftward midline shift. 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 Small amount of scattered nonspecific T2 hyperintense lesions of the white matter, likely represent chronic microangiopathy. Vascular: Normal flow voids. Skull and upper cervical spine: Normal marrow signal. Sinuses/Orbits: Negative. Other: None. IMPRESSION: Six enhancing lesions identified, consistent with metastatic disease to the brain, as detailed above. Significant mass effect related to the right temporal and left frontal lesions with surrounding vasogenic edema. A 2 mm leftward midline shift, improved from 4 mm on prior MRI. Electronically Signed   By: Pedro Earls M.D.   On: 08/06/2021 17:05   DG Chest Port 1 View  Result Date: 07/30/2021 CLINICAL DATA:  Left-sided bronchoscopy. EXAM: PORTABLE CHEST 1 VIEW COMPARISON:  CT chest dated July 24, 2021. FINDINGS: The heart size and mediastinal contours are within normal limits. Normal pulmonary vascularity. Unchanged 4.3 cm mass in the left upper lobe. No focal consolidation, pleural effusion, or pneumothorax. No acute osseous abnormality. IMPRESSION: 1. No active disease. 2. Unchanged left upper lobe mass. Electronically Signed   By: Titus Dubin M.D.   On: 07/30/2021 09:02   DG C-ARM BRONCHOSCOPY  Result Date: 07/30/2021 C-ARM  BRONCHOSCOPY: Fluoroscopy was utilized by the requesting physician.  No radiographic interpretation.    All questions were answered. The patient knows to call the clinic with any problems, questions or concerns. I spent 40 minutes in the care of this patient including H and P, review of records, counseling and coordination of care.     Benay Pike, MD 08/27/2021 11:25 AM

## 2021-08-27 NOTE — Assessment & Plan Note (Addendum)
Mammogram done Jun 15, 2021 with concern for right breast mass warranting further investigation.  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  There is no evidence of axillary lymphadenopathy on imaging. We have discussed her in the breast [place].  It appears that she may have locally advanced breast cancer without any involvement of axillary lymph nodes or other metastatic disease related to breast cancer.  Since CarboTaxol is also active in breast cancer, I will continue to monitor her breast mass while she is on CarboTaxol concurrent with radiation for her non-small cell lung cancer. Foundation 1 testing on the breast specimen with microsatellite status, stable, TMB 4 mutations per MB, some other genomic findings but no therapeutic implications.  PD-L1 showed a CPS score of 10 from the breast specimen

## 2021-08-27 NOTE — Assessment & Plan Note (Signed)
This is highly concerning for metastatic disease from a primary lung cancer.  She is now status post SRS.  She will continue to follow-up with radiation oncology.

## 2021-08-30 ENCOUNTER — Telehealth: Payer: Self-pay | Admitting: *Deleted

## 2021-08-30 ENCOUNTER — Encounter: Payer: Self-pay | Admitting: Hematology and Oncology

## 2021-08-30 ENCOUNTER — Other Ambulatory Visit: Payer: Self-pay

## 2021-08-30 ENCOUNTER — Other Ambulatory Visit: Payer: Self-pay | Admitting: *Deleted

## 2021-08-30 ENCOUNTER — Ambulatory Visit
Admission: RE | Admit: 2021-08-30 | Discharge: 2021-08-30 | Disposition: A | Discharge status: Home / Self Care | Payer: Medicare Other | Source: Ambulatory Visit | Attending: Radiation Oncology | Admitting: Radiation Oncology

## 2021-08-30 ENCOUNTER — Inpatient Hospital Stay: Payer: Medicare Other

## 2021-08-30 VITALS — BP 123/75 | HR 88 | Temp 98.2°F | Resp 17

## 2021-08-30 DIAGNOSIS — Z79899 Other long term (current) drug therapy: Secondary | ICD-10-CM | POA: Diagnosis not present

## 2021-08-30 DIAGNOSIS — C3490 Malignant neoplasm of unspecified part of unspecified bronchus or lung: Secondary | ICD-10-CM

## 2021-08-30 DIAGNOSIS — Z171 Estrogen receptor negative status [ER-]: Secondary | ICD-10-CM | POA: Diagnosis not present

## 2021-08-30 DIAGNOSIS — Z5111 Encounter for antineoplastic chemotherapy: Secondary | ICD-10-CM | POA: Diagnosis not present

## 2021-08-30 DIAGNOSIS — C3412 Malignant neoplasm of upper lobe, left bronchus or lung: Secondary | ICD-10-CM | POA: Diagnosis not present

## 2021-08-30 DIAGNOSIS — R739 Hyperglycemia, unspecified: Secondary | ICD-10-CM | POA: Insufficient documentation

## 2021-08-30 DIAGNOSIS — Z95828 Presence of other vascular implants and grafts: Secondary | ICD-10-CM

## 2021-08-30 DIAGNOSIS — Z510 Encounter for antineoplastic radiation therapy: Secondary | ICD-10-CM | POA: Diagnosis not present

## 2021-08-30 DIAGNOSIS — C50211 Malignant neoplasm of upper-inner quadrant of right female breast: Secondary | ICD-10-CM | POA: Diagnosis not present

## 2021-08-30 LAB — RAD ONC ARIA SESSION SUMMARY
Course Elapsed Days: 14
Plan Fractions Treated to Date: 1
Plan Prescribed Dose Per Fraction: 2 Gy
Plan Total Fractions Prescribed: 30
Plan Total Prescribed Dose: 60 Gy
Reference Point Dosage Given to Date: 2 Gy
Reference Point Session Dosage Given: 2 Gy
Session Number: 6

## 2021-08-30 LAB — CMP (CANCER CENTER ONLY)
ALT: 37 U/L (ref 0–44)
AST: 15 U/L (ref 15–41)
Albumin: 3.6 g/dL (ref 3.5–5.0)
Alkaline Phosphatase: 60 U/L (ref 38–126)
Anion gap: 6 (ref 5–15)
BUN: 20 mg/dL (ref 8–23)
CO2: 27 mmol/L (ref 22–32)
Calcium: 9.1 mg/dL (ref 8.9–10.3)
Chloride: 102 mmol/L (ref 98–111)
Creatinine: 0.72 mg/dL (ref 0.44–1.00)
GFR, Estimated: >60 mL/min (ref >60)
Glucose, Bld: 522 mg/dL (ref 70–99)
Potassium: 4 mmol/L (ref 3.5–5.1)
Sodium: 135 mmol/L (ref 135–145)
Total Bilirubin: 0.8 mg/dL (ref 0.3–1.2)
Total Protein: 5.9 g/dL — ABNORMAL LOW (ref 6.5–8.1)

## 2021-08-30 LAB — CBC WITH DIFFERENTIAL (CANCER CENTER ONLY)
Abs Immature Granulocytes: 0.04 10*3/uL (ref 0.00–0.07)
Basophils Absolute: 0 10*3/uL (ref 0.0–0.1)
Basophils Relative: 0 %
Eosinophils Absolute: 0 10*3/uL (ref 0.0–0.5)
Eosinophils Relative: 0 %
HCT: 42 % (ref 36.0–46.0)
Hemoglobin: 14.4 g/dL (ref 12.0–15.0)
Immature Granulocytes: 0 %
Lymphocytes Relative: 4 %
Lymphs Abs: 0.4 10*3/uL — ABNORMAL LOW (ref 0.7–4.0)
MCH: 30.3 pg (ref 26.0–34.0)
MCHC: 34.3 g/dL (ref 30.0–36.0)
MCV: 88.4 fL (ref 80.0–100.0)
Monocytes Absolute: 0.6 10*3/uL (ref 0.1–1.0)
Monocytes Relative: 7 %
Neutro Abs: 8.4 10*3/uL — ABNORMAL HIGH (ref 1.7–7.7)
Neutrophils Relative %: 89 %
Platelet Count: 147 10*3/uL — ABNORMAL LOW (ref 150–400)
RBC: 4.75 MIL/uL (ref 3.87–5.11)
RDW: 13.8 % (ref 11.5–15.5)
WBC Count: 9.4 10*3/uL (ref 4.0–10.5)
nRBC: 0 % (ref 0.0–0.2)

## 2021-08-30 MED ORDER — INSULIN ASPART 100 UNIT/ML IJ SOLN
10.0000 [IU] | Freq: Once | INTRAMUSCULAR | Status: AC
  Administered 2021-08-30: 10 [IU] via SUBCUTANEOUS

## 2021-08-30 MED ORDER — INSULIN ASPART 100 UNIT/ML IJ SOLN
INTRAMUSCULAR | Status: DC
Start: 2021-08-30 — End: 2021-08-30
  Filled 2021-08-30: qty 1

## 2021-08-30 MED ORDER — HEPARIN SOD (PORK) LOCK FLUSH 100 UNIT/ML IV SOLN: 500.0000 [IU] | Freq: Once | INTRAVENOUS | Status: DC | PRN

## 2021-08-30 MED ORDER — SODIUM CHLORIDE 0.9 % IV SOLN
45.0000 mg/m2 | Freq: Once | INTRAVENOUS | Status: AC
  Administered 2021-08-30: 90 mg via INTRAVENOUS
  Filled 2021-08-30: qty 15

## 2021-08-30 MED ORDER — SODIUM CHLORIDE 0.9% FLUSH
10.0000 mL | Freq: Once | INTRAVENOUS | Status: AC
  Administered 2021-08-30: 10 mL

## 2021-08-30 MED ORDER — DIPHENHYDRAMINE HCL 50 MG/ML IJ SOLN: 50.0000 mg | Freq: Once | INTRAMUSCULAR | Status: DC

## 2021-08-30 MED ORDER — PALONOSETRON HCL INJECTION 0.25 MG/5ML
INTRAVENOUS | Status: DC
Start: 2021-08-30 — End: 2021-08-30
  Filled 2021-08-30: qty 5

## 2021-08-30 MED ORDER — SODIUM CHLORIDE 0.9 % IV SOLN
10.0000 mg | Freq: Once | INTRAVENOUS | Status: DC
  Filled 2021-08-30 (×2): qty 1

## 2021-08-30 MED ORDER — SODIUM CHLORIDE 0.9% FLUSH: 10.0000 mL | INTRAVENOUS | Status: DC | PRN

## 2021-08-30 MED ORDER — FAMOTIDINE IN NACL 20-0.9 MG/50ML-% IV SOLN
20.0000 mg | Freq: Once | INTRAVENOUS | Status: AC
  Administered 2021-08-30: 20 mg via INTRAVENOUS

## 2021-08-30 MED ORDER — SODIUM CHLORIDE 0.9 % IV SOLN
180.0000 mg | Freq: Once | INTRAVENOUS | Status: AC
  Administered 2021-08-30: 180 mg via INTRAVENOUS
  Filled 2021-08-30: qty 18

## 2021-08-30 MED ORDER — CETIRIZINE HCL 10 MG/ML IV SOLN
10.0000 mg | Freq: Once | INTRAVENOUS | Status: AC
  Administered 2021-08-30: 10 mg via INTRAVENOUS
  Filled 2021-08-30: qty 1

## 2021-08-30 MED ORDER — FAMOTIDINE IN NACL 20-0.9 MG/50ML-% IV SOLN
INTRAVENOUS | Status: AC
  Filled 2021-08-30: qty 50

## 2021-08-30 MED ORDER — PALONOSETRON HCL INJECTION 0.25 MG/5ML
0.2500 mg | Freq: Once | INTRAVENOUS | Status: AC
  Administered 2021-08-30: 0.25 mg via INTRAVENOUS

## 2021-08-30 MED ORDER — INSULIN ASPART 100 UNIT/ML IJ SOLN
6.0000 [IU] | Freq: Once | INTRAMUSCULAR | Status: AC
  Administered 2021-08-30: 6 [IU] via SUBCUTANEOUS

## 2021-08-30 MED ORDER — SODIUM CHLORIDE 0.9 % IV SOLN: Freq: Once | INTRAVENOUS | Status: AC

## 2021-08-30 MED ORDER — INSULIN ASPART 100 UNIT/ML IJ SOLN
INTRAMUSCULAR | Status: AC
  Filled 2021-08-30: qty 1

## 2021-08-30 NOTE — Telephone Encounter (Signed)
CRITICAL VALUE STICKER  CRITICAL VALUE: Glucose 522  RECEIVER (on-site recipient of call):Beth Cathaleen Korol, RN  DATE & TIME NOTIFIED: 08/30/21  1400  MESSENGER (representative from lab): Pam  MD NOTIFIED: Dr. Chryl Heck  TIME OF NOTIFICATION:1403  RESPONSE:  acknowledged receipt

## 2021-08-30 NOTE — Telephone Encounter (Addendum)
Per noted glucose reading 522 per lab today - orders reviewed by MD with request to hold IV decadron today. Orders placed for insulin with recheck of blood sugar 1 hour post administration.  Noted pt is on oral decadron started in mid July per radiation oncology at starting dose of 4 mg tid-  Taper dosing given from rad onc on 8/4 - with current dose of decadron of 1/2 (2mg ) bid verified by her sister who assist in medication administration.  This note will be forwarded to MD's for review for additional recommendations.  1 hour recheck post insulin administration blood sugar is 388. Reviewed with covering provider and obtained order for additional insulin with 1 hour recheck post administration.  This RN called and spoke with the pt's niece per above as well as plan for pt to come in tomorrow for lab and assessment by LCC/NP. Per niece her mother has diabetes and understands how to check blood sugars.  This RN spoke also with sister who is waiting - and she understands plan as well.   She will check the blood sugar later tonight and in am before breakfast to give [name] readings when they come in.  This RN also discussed any other issues or concerns they are to call the on call tonight.  Sister verbalized understanding.

## 2021-08-30 NOTE — Progress Notes (Signed)
The pharmacy team has substituted IV diphenhydramine for IV cetirizine as a premedication. Patient will be monitored for hypersensitivity reaction and adverse reactions to IV cetirizine. Thanks.   Kennith Center, Pharm.D., CPP 08/30/2021@2 :01 PM

## 2021-08-30 NOTE — Progress Notes (Signed)
                                                                                                                                                             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   Intent: Palliative  Radiation Treatment Dates:   08/16/2021 through 08/20/2021 [facility] Treatment was administered with IMRT technique with 6XFFF beam energy to the following sites and doses:  PTV_1_Rtemp_54 mm received 30 Gy in 5 fractions  PTV_2_Lparafal_36 mm received 27 Gy in 3 fractions PTV_3_Ltemp_3 mm received 20 Gy in 1 fraction PTV_4_Loccipit_58mm received 20 Gy in 1 fraction PTV_5_Rtemp_23mm received 30 Gy in 3 fractions PTV_6_Loccipit_37mm received 20 Gy in 1 fraction  Narrative: The patient tolerated radiation therapy relatively well.  She will start chemoRT for her lung on 08/30/21.  Plan: The patient will receive a call in about one month from the radiation oncology department. She will continue chemoradiation to the chest for her lung cancer given that her tumor burden in the chest was limited to the LUL and regional nodes, and outside of the chest, her brain disease is her only site of oligometastatic disease. She follow up with Dr. Chryl Heck as well. Depending on her course, she may also proceed with local breast treatment in the future. She will also be followed in the brain oncology program with routine MRI imaging surveillance.    ________________________________________________    Carola Rhine, PAC

## 2021-08-30 NOTE — Progress Notes (Signed)
Repeat CBG 357.  Dr. Irene Limbo notified

## 2021-08-30 NOTE — Progress Notes (Signed)
Repeat CBG was 388.  Val, RN and Dr Chryl Heck notified

## 2021-08-30 NOTE — Patient Instructions (Signed)
[facility]  Discharge Instructions: Thank you for choosing [facility] to provide your oncology and hematology care.   If you have a lab appointment with the [facility], please go directly to the [facility] and check in at the registration area.   Wear comfortable clothing and clothing appropriate for easy access to any Portacath or PICC line.   We strive to give you quality time with your provider. You may need to reschedule your appointment if you arrive late (15 or more minutes).  Arriving late affects you and other patients whose appointments are after yours.  Also, if you miss three or more appointments without notifying the office, you may be dismissed from the clinic at the provider's discretion.      For prescription refill requests, have your pharmacy contact our office and allow 72 hours for refills to be completed.    Today you received the following chemotherapy and/or immunotherapy agents: Taxol, carboplatin      To help prevent nausea and vomiting after your treatment, we encourage you to take your nausea medication as directed.  BELOW ARE SYMPTOMS THAT SHOULD BE REPORTED IMMEDIATELY: *FEVER GREATER THAN 100.4 F (38 C) OR HIGHER *CHILLS OR SWEATING *NAUSEA AND VOMITING THAT IS NOT CONTROLLED WITH YOUR NAUSEA MEDICATION *UNUSUAL SHORTNESS OF BREATH *UNUSUAL BRUISING OR BLEEDING *URINARY PROBLEMS (pain or burning when urinating, or frequent urination) *BOWEL PROBLEMS (unusual diarrhea, constipation, pain near the anus) TENDERNESS IN MOUTH AND THROAT WITH OR WITHOUT PRESENCE OF ULCERS (sore throat, sores in mouth, or a toothache) UNUSUAL RASH, SWELLING OR PAIN  UNUSUAL VAGINAL DISCHARGE OR ITCHING   Items with * indicate a potential emergency and should be followed up as soon as possible or go to the Emergency Department if any problems should occur.  Please show the CHEMOTHERAPY ALERT CARD or IMMUNOTHERAPY ALERT CARD at  check-in to the Emergency Department and triage nurse.  Should you have questions after your visit or need to cancel or reschedule your appointment, please contact [facility]  Dept: [phone]  and follow the prompts.  Office hours are 8:00 a.m. to 4:30 p.m. Monday - Friday. Please note that voicemails left after 4:00 p.m. may not be returned until the following business day.  We are closed weekends and major holidays. You have access to a nurse at all times for urgent questions. Please call the main number to the clinic Dept: [phone] and follow the prompts.   For any non-urgent questions, you may also contact your provider using MyChart. We now offer e-Visits for anyone 76 and older to request care online for non-urgent symptoms. For details visit [email or web address].   Also download the MyChart app! Go to the app store, search "MyChart", open the app, select [facility], and log in with your MyChart username and password.  Masks are optional in the cancer centers. If you would like for your care team to wear a mask while they are taking care of you, please let them know. You may have one support person who is at least 76 years old accompany you for your appointments. Paclitaxel Injection What is this medication? PACLITAXEL (PAK li TAX el) treats some types of cancer. It works by slowing down the growth of cancer cells. This medicine may be used for other purposes; ask your health care provider or pharmacist if you have questions. COMMON BRAND NAME(S): Onxol, Taxol What should I tell my care team before I take this  medication? They need to know if you have any of these conditions: Heart disease Liver disease Low white blood cell levels An unusual or allergic reaction to paclitaxel, other medications, foods, dyes, or preservatives If you or your partner are pregnant or trying to get pregnant Breast-feeding How should I use this medication? This  medication is injected into a vein. It is given by your care team in a hospital or clinic setting. Talk to your care team about the use of this medication in children. While it may be given to children for selected conditions, precautions do apply. Overdosage: If you think you have taken too much of this medicine contact a poison control center or emergency room at once. NOTE: This medicine is only for you. Do not share this medicine with others. What if I miss a dose? Keep appointments for follow-up doses. It is important not to miss your dose. Call your care team if you are unable to keep an appointment. What may interact with this medication? Do not take this medication with any of the following: Live virus vaccines Other medications may affect the way this medication works. Talk with your care team about all of the medications you take. They may suggest changes to your treatment plan to lower the risk of side effects and to make sure your medications work as intended. This list may not describe all possible interactions. Give your health care provider a list of all the medicines, herbs, non-prescription drugs, or dietary supplements you use. Also tell them if you smoke, drink alcohol, or use illegal drugs. Some items may interact with your medicine. What should I watch for while using this medication? Your condition will be monitored carefully while you are receiving this medication. You may need blood work while taking this medication. This medication may make you feel generally unwell. This is not uncommon as chemotherapy can affect healthy cells as well as cancer cells. Report any side effects. Continue your course of treatment even though you feel ill unless your care team tells you to stop. This medication can cause serious allergic reactions. To reduce the risk, your care team may give you other medications to take before receiving this one. Be sure to follow the directions from your care  team. This medication may increase your risk of getting an infection. Call your care team for advice if you get a fever, chills, sore throat, or other symptoms of a cold or flu. Do not treat yourself. Try to avoid being around people who are sick. This medication may increase your risk to bruise or bleed. Call your care team if you notice any unusual bleeding. Be careful brushing or flossing your teeth or using a toothpick because you may get an infection or bleed more easily. If you have any dental work done, tell your dentist you are receiving this medication. Talk to your care team if you may be pregnant. Serious birth defects can occur if you take this medication during pregnancy. Talk to your care team before breastfeeding. Changes to your treatment plan may be needed. What side effects may I notice from receiving this medication? Side effects that you should report to your care team as soon as possible: Allergic reactions--skin rash, itching, hives, swelling of the face, lips, tongue, or throat Heart rhythm changes--fast or irregular heartbeat, dizziness, feeling faint or lightheaded, chest pain, trouble breathing Increase in blood pressure Infection--fever, chills, cough, sore throat, wounds that don't heal, pain or trouble when passing urine, general feeling of  discomfort or being unwell Low blood pressure--dizziness, feeling faint or lightheaded, blurry vision Low red blood cell level--unusual weakness or fatigue, dizziness, headache, trouble breathing Painful swelling, warmth, or redness of the skin, blisters or sores at the infusion site Pain, tingling, or numbness in the hands or feet Slow heartbeat--dizziness, feeling faint or lightheaded, confusion, trouble breathing, unusual weakness or fatigue Unusual bruising or bleeding Side effects that usually do not require medical attention (report to your care team if they continue or are bothersome): Diarrhea Hair loss Joint pain Loss of  appetite Muscle pain Nausea Vomiting This list may not describe all possible side effects. Call your doctor for medical advice about side effects. You may report side effects to FDA at 1-800-FDA-1088. Where should I keep my medication? This medication is given in a hospital or clinic. It will not be stored at home. NOTE: This sheet is a summary. It may not cover all possible information. If you have questions about this medicine, talk to your doctor, pharmacist, or health care provider.  2023 Elsevier/Gold Standard (2021-05-20 00:00:00) Carboplatin Injection What is this medication? CARBOPLATIN (KAR boe pla tin) treats some types of cancer. It works by slowing down the growth of cancer cells. This medicine may be used for other purposes; ask your health care provider or pharmacist if you have questions. COMMON BRAND NAME(S): Paraplatin What should I tell my care team before I take this medication? They need to know if you have any of these conditions: Blood disorders Hearing problems Kidney disease Recent or ongoing radiation therapy An unusual or allergic reaction to carboplatin, cisplatin, other medications, foods, dyes, or preservatives Pregnant or trying to get pregnant Breast-feeding How should I use this medication? This medication is injected into a vein. It is given by your care team in a hospital or clinic setting. Talk to your care team about the use of this medication in children. Special care may be needed. Overdosage: If you think you have taken too much of this medicine contact a poison control center or emergency room at once. NOTE: This medicine is only for you. Do not share this medicine with others. What if I miss a dose? Keep appointments for follow-up doses. It is important not to miss your dose. Call your care team if you are unable to keep an appointment. What may interact with this medication? Medications for seizures Some antibiotics, such as amikacin,  gentamicin, neomycin, streptomycin, tobramycin Vaccines This list may not describe all possible interactions. Give your health care provider a list of all the medicines, herbs, non-prescription drugs, or dietary supplements you use. Also tell them if you smoke, drink alcohol, or use illegal drugs. Some items may interact with your medicine. What should I watch for while using this medication? Your condition will be monitored carefully while you are receiving this medication. You may need blood work while taking this medication. This medication may make you feel generally unwell. This is not uncommon, as chemotherapy can affect healthy cells as well as cancer cells. Report any side effects. Continue your course of treatment even though you feel ill unless your care team tells you to stop. In some cases, you may be given additional medications to help with side effects. Follow all directions for their use. This medication may increase your risk of getting an infection. Call your care team for advice if you get a fever, chills, sore throat, or other symptoms of a cold or flu. Do not treat yourself. Try to avoid being around  people who are sick. Avoid taking medications that contain aspirin, acetaminophen, ibuprofen, naproxen, or ketoprofen unless instructed by your care team. These medications may hide a fever. Be careful brushing or flossing your teeth or using a toothpick because you may get an infection or bleed more easily. If you have any dental work done, tell your dentist you are receiving this medication. Talk to your care team if you wish to become pregnant or think you might be pregnant. This medication can cause serious birth defects. Talk to your care team about effective forms of contraception. Do not breast-feed while taking this medication. What side effects may I notice from receiving this medication? Side effects that you should report to your care team as soon as possible: Allergic  reactions--skin rash, itching, hives, swelling of the face, lips, tongue, or throat Infection--fever, chills, cough, sore throat, wounds that don't heal, pain or trouble when passing urine, general feeling of discomfort or being unwell Low red blood cell level--unusual weakness or fatigue, dizziness, headache, trouble breathing Pain, tingling, or numbness in the hands or feet, muscle weakness, change in vision, confusion or trouble speaking, loss of balance or coordination, trouble walking, seizures Unusual bruising or bleeding Side effects that usually do not require medical attention (report to your care team if they continue or are bothersome): Hair loss Nausea Unusual weakness or fatigue Vomiting This list may not describe all possible side effects. Call your doctor for medical advice about side effects. You may report side effects to FDA at 1-800-FDA-1088. Where should I keep my medication? This medication is given in a hospital or clinic. It will not be stored at home. NOTE: This sheet is a summary. It may not cover all possible information. If you have questions about this medicine, talk to your doctor, pharmacist, or health care provider.  2023 Elsevier/Gold Standard (2021-04-27 00:00:00)

## 2021-08-31 ENCOUNTER — Ambulatory Visit
Admission: RE | Admit: 2021-08-31 | Discharge: 2021-08-31 | Disposition: A | Discharge status: Home / Self Care | Payer: Medicare Other | Source: Ambulatory Visit | Attending: Radiation Oncology | Admitting: Radiation Oncology

## 2021-08-31 ENCOUNTER — Other Ambulatory Visit: Payer: Self-pay

## 2021-08-31 ENCOUNTER — Inpatient Hospital Stay (HOSPITAL_BASED_OUTPATIENT_CLINIC_OR_DEPARTMENT_OTHER): Payer: Medicare Other | Admitting: Adult Health

## 2021-08-31 ENCOUNTER — Encounter: Payer: Self-pay | Admitting: Hematology and Oncology

## 2021-08-31 ENCOUNTER — Other Ambulatory Visit (HOSPITAL_COMMUNITY): Payer: Self-pay

## 2021-08-31 ENCOUNTER — Inpatient Hospital Stay: Payer: Medicare Other

## 2021-08-31 ENCOUNTER — Encounter: Payer: Self-pay | Admitting: Adult Health

## 2021-08-31 VITALS — BP 106/59 | HR 92 | Temp 97.5°F | Resp 18 | Ht 68.0 in | Wt 169.8 lb

## 2021-08-31 DIAGNOSIS — C3490 Malignant neoplasm of unspecified part of unspecified bronchus or lung: Secondary | ICD-10-CM

## 2021-08-31 DIAGNOSIS — C50919 Malignant neoplasm of unspecified site of unspecified female breast: Secondary | ICD-10-CM | POA: Diagnosis not present

## 2021-08-31 DIAGNOSIS — G9389 Other specified disorders of brain: Secondary | ICD-10-CM | POA: Diagnosis not present

## 2021-08-31 DIAGNOSIS — C7931 Secondary malignant neoplasm of brain: Secondary | ICD-10-CM | POA: Diagnosis not present

## 2021-08-31 DIAGNOSIS — Z5111 Encounter for antineoplastic chemotherapy: Secondary | ICD-10-CM | POA: Diagnosis not present

## 2021-08-31 DIAGNOSIS — R739 Hyperglycemia, unspecified: Secondary | ICD-10-CM

## 2021-08-31 DIAGNOSIS — C3412 Malignant neoplasm of upper lobe, left bronchus or lung: Secondary | ICD-10-CM | POA: Diagnosis not present

## 2021-08-31 DIAGNOSIS — Z79899 Other long term (current) drug therapy: Secondary | ICD-10-CM | POA: Diagnosis not present

## 2021-08-31 DIAGNOSIS — Z171 Estrogen receptor negative status [ER-]: Secondary | ICD-10-CM | POA: Diagnosis not present

## 2021-08-31 DIAGNOSIS — Z95828 Presence of other vascular implants and grafts: Secondary | ICD-10-CM

## 2021-08-31 DIAGNOSIS — C50211 Malignant neoplasm of upper-inner quadrant of right female breast: Secondary | ICD-10-CM | POA: Diagnosis not present

## 2021-08-31 DIAGNOSIS — Z510 Encounter for antineoplastic radiation therapy: Secondary | ICD-10-CM | POA: Diagnosis not present

## 2021-08-31 DIAGNOSIS — C3492 Malignant neoplasm of unspecified part of left bronchus or lung: Secondary | ICD-10-CM

## 2021-08-31 DIAGNOSIS — R5381 Other malaise: Secondary | ICD-10-CM | POA: Diagnosis not present

## 2021-08-31 DIAGNOSIS — R918 Other nonspecific abnormal finding of lung field: Secondary | ICD-10-CM | POA: Diagnosis not present

## 2021-08-31 LAB — RAD ONC ARIA SESSION SUMMARY
Course Elapsed Days: 15
Plan Fractions Treated to Date: 2
Plan Prescribed Dose Per Fraction: 2 Gy
Plan Total Fractions Prescribed: 30
Plan Total Prescribed Dose: 60 Gy
Reference Point Dosage Given to Date: 4 Gy
Reference Point Session Dosage Given: 2 Gy
Session Number: 7

## 2021-08-31 LAB — BASIC METABOLIC PANEL - CANCER CENTER ONLY
Anion gap: 5 (ref 5–15)
BUN: 21 mg/dL (ref 8–23)
CO2: 27 mmol/L (ref 22–32)
Calcium: 9.1 mg/dL (ref 8.9–10.3)
Chloride: 102 mmol/L (ref 98–111)
Creatinine: 0.62 mg/dL (ref 0.44–1.00)
GFR, Estimated: >60 mL/min (ref >60)
Glucose, Bld: 389 mg/dL — ABNORMAL HIGH (ref 70–99)
Potassium: 4.1 mmol/L (ref 3.5–5.1)
Sodium: 134 mmol/L — ABNORMAL LOW (ref 135–145)

## 2021-08-31 MED ORDER — "INSULIN SYRINGE 29G X 1"" 0.3 ML MISC": 1.0000 | Freq: Three times a day (TID) | 1 refills | Status: DC

## 2021-08-31 MED ORDER — GLUCOSE 4 G PO CHEW: 1.0000 | CHEWABLE_TABLET | ORAL | 0 refills | Status: DC | PRN

## 2021-08-31 MED ORDER — INSULIN REGULAR HUMAN 100 UNIT/ML IJ SOLN
INTRAMUSCULAR | 1 refills | Status: DC
  Filled 2021-08-31: qty 10, 28d supply, fill #0
  Filled 2021-09-28: qty 10, 28d supply, fill #1

## 2021-08-31 MED ORDER — BLOOD GLUCOSE MONITOR KIT: PACK | 0 refills | Status: DC

## 2021-08-31 MED ORDER — SODIUM CHLORIDE 0.9% FLUSH
10.0000 mL | Freq: Once | INTRAVENOUS | Status: AC
  Administered 2021-08-31: 10 mL

## 2021-08-31 NOTE — Progress Notes (Signed)
[facility] Follow up:    [name], [place] [id number]   DIAGNOSIS: lung cancer  SUMMARY OF ONCOLOGIC HISTORY: Oncology History  Malignant neoplasm of unspecified part of unspecified bronchus or lung ([place])  08/13/2021 Initial Diagnosis   Malignant neoplasm of unspecified part of unspecified bronchus or lung ([place])   08/30/2021 -  Chemotherapy   Patient is on Treatment Plan : LUNG Carboplatin / Paclitaxel + XRT q7d       CURRENT THERAPY: chemoradiation  INTERVAL HISTORY: FANTA WIMBERLEY 76 y.o. female returns for f/u of her hyperglycemia today accompanied by her sister.  She has been on oral dexamethasone BID for her brain mets.  She is now decreased the dosage to 1/2 tab BID since 08/28/2021.  Her blood sugar yesterday was 522.  She did not receive IV dexamtheasone pre chemo.  She denies any other significant issues.     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  MEDICAL HISTORY: Past Medical History:  Diagnosis Date   Anemia    Arthritis    Breast cancer ([place])    Cancer ([place]) 1999   Breast Cancer   Hyperlipidemia    Hypertension    Osteopenia 01/12/2021    SURGICAL HISTORY: Past Surgical History:  Procedure Laterality Date   BREAST SURGERY  1999   Masectomy-  left   BRONCHIAL BIOPSY  07/30/2021   Procedure: BRONCHIAL BIOPSIES;  Surgeon: Collene Gobble, MD;  Location: [facility] ENDOSCOPY;  Service: Pulmonary;;   BRONCHIAL BRUSHINGS  07/30/2021   Procedure: BRONCHIAL BRUSHINGS;  Surgeon: Collene Gobble, MD;  Location[facility] ENDOSCOPY;  Service: Pulmonary;;   BRONCHIAL NEEDLE ASPIRATION BIOPSY  07/30/2021   Procedure: BRONCHIAL NEEDLE ASPIRATION BIOPSIES;  Surgeon: Collene Gobble, MD;  Location: [facility] ENDOSCOPY;  Service: Pulmonary;;   CYSTECTOMY     cyst removed off top of head   IR IMAGING GUIDED PORT INSERTION  08/20/2021   MASTECTOMY     VIDEO BRONCHOSCOPY WITH RADIAL ENDOBRONCHIAL ULTRASOUND  07/30/2021   Procedure: VIDEO BRONCHOSCOPY WITH RADIAL ENDOBRONCHIAL ULTRASOUND;  Surgeon: Collene Gobble, MD;  Location: [facility] ENDOSCOPY;  Service: Pulmonary;;    SOCIAL HISTORY: Social History   Socioeconomic History   Marital status: Single    Spouse name: Not on file   Number of children: 0   Years of education: 12   Highest education level: High school graduate  Occupational History   Occupation: retired  Tobacco Use   Smoking status: Never   Smokeless tobacco: Never  Vaping Use   Vaping Use: Never used  Substance and Sexual Activity   Alcohol use: No   Drug use: Never   Sexual activity: Yes    Birth control/protection: Post-menopausal  Other Topics Concern   Not on file  Social History Narrative   Not on  file   Social Determinants of Health   Financial Resource Strain: Not on file  Food Insecurity: Not on file  Transportation Needs: Not on file  Physical Activity: Not on file  Stress: Not on file  Social Connections: Not on file  Intimate Partner Violence: Not on file    FAMILY HISTORY: Family History  Problem Relation Age of Onset   Arthritis Mother    Breast cancer Sister    Cancer Sister        Breast Cancer   Diabetes Sister    Hyperlipidemia Sister    Hypertension Sister    Stroke Sister    Diabetes Brother    Hypertension  Brother     Review of Systems  Constitutional:  Positive for fatigue. Negative for appetite change, chills, fever and unexpected weight change.  HENT:   Negative for hearing loss, lump/mass and trouble swallowing.   Eyes:  Negative for eye problems and icterus.  Respiratory:  Negative for chest tightness, cough and shortness of breath.   Cardiovascular:  Negative for chest pain, leg swelling and palpitations.  Gastrointestinal:  Negative for abdominal distention, abdominal pain, constipation, diarrhea, nausea and vomiting.  Endocrine: Negative for hot flashes.  Genitourinary:  Negative for difficulty urinating.   Musculoskeletal:  Negative for arthralgias.  Skin:  Negative for itching and rash.  Neurological:  Negative for dizziness, extremity weakness, headaches and numbness.  Hematological:  Negative for adenopathy. Does not bruise/bleed easily.  Psychiatric/Behavioral:  Negative for depression. The patient is not nervous/anxious.       PHYSICAL EXAMINATION  ECOG PERFORMANCE STATUS: 2 - Symptomatic, <50% confined to bed  Vitals:   08/31/21 1458  BP: (!) 106/59  Pulse: 92  Resp: 18  Temp: (!) 97.5 F (36.4 C)  SpO2: 97%    Physical Exam Constitutional:      General: She is not in acute distress.    Appearance: Normal appearance. She is not toxic-appearing.     Comments: Appears tired and flat affect  HENT:     Head: Normocephalic and atraumatic.  Eyes:     General: No scleral icterus. Cardiovascular:     Rate and Rhythm: Normal rate and regular rhythm.     Pulses: Normal pulses.     Heart sounds: Normal heart sounds.  Pulmonary:     Effort: Pulmonary effort is normal.     Breath sounds: Normal breath sounds.  Abdominal:     General: Abdomen is flat. Bowel sounds are normal. There is no distension.     Palpations: Abdomen is soft.     Tenderness: There is no abdominal tenderness.  Musculoskeletal:        General: No swelling.     Cervical back: Neck supple.   Lymphadenopathy:     Cervical: No cervical adenopathy.  Skin:    General: Skin is warm and dry.     Findings: No rash.  Neurological:     General: No focal deficit present.     Mental Status: She is alert.  Psychiatric:        Mood and Affect: Mood normal.        Behavior: Behavior normal.     LABORATORY DATA:  CBC    Component Value Date/Time   WBC 9.4 08/30/2021 1306   WBC 6.7 07/25/2021 0111   RBC 4.75 08/30/2021 1306   HGB 14.4 08/30/2021 1306   HGB 15.7 07/07/2021 1538   HCT 42.0 08/30/2021 1306   HCT 46.6 07/07/2021 1538  PLT 147 (L) 08/30/2021 1306   PLT 450 07/07/2021 1538   MCV 88.4 08/30/2021 1306   MCV 88 07/07/2021 1538   MCH 30.3 08/30/2021 1306   MCHC 34.3 08/30/2021 1306   RDW 13.8 08/30/2021 1306   RDW 12.9 07/07/2021 1538   LYMPHSABS 0.4 (L) 08/30/2021 1306   LYMPHSABS 1.6 07/07/2021 1538   MONOABS 0.6 08/30/2021 1306   EOSABS 0.0 08/30/2021 1306   EOSABS 0.1 07/07/2021 1538   BASOSABS 0.0 08/30/2021 1306   BASOSABS 0.0 07/07/2021 1538    CMP     Component Value Date/Time   NA 134 (L) 08/31/2021 1349   NA 141 07/07/2021 1538   K 4.1 08/31/2021 1349   CL 102 08/31/2021 1349   CO2 27 08/31/2021 1349   GLUCOSE 389 (H) 08/31/2021 1349   BUN 21 08/31/2021 1349   BUN 9 07/07/2021 1538   CREATININE 0.62 08/31/2021 1349   CALCIUM 9.1 08/31/2021 1349   PROT 5.9 (L) 08/30/2021 1306   PROT 7.9 07/07/2021 1538   ALBUMIN 3.6 08/30/2021 1306   ALBUMIN 4.6 07/07/2021 1538   AST 15 08/30/2021 1306   ALT 37 08/30/2021 1306   ALKPHOS 60 08/30/2021 1306   BILITOT 0.8 08/30/2021 1306   GFRNONAA >60 08/31/2021 1349   GFRAA 103 03/05/2020 1119     ASSESSMENT and THERAPY PLAN:   Malignant neoplasm of unspecified part of unspecified bronchus or lung ([place]) Heiley is a 76 year old woman undergoing chemoradiation for her metastatic lung cancer.  She has treatment related hyperglycemia with sugars as high as 522.    Her blood sugar today is 389 which is  better than prior.  Her sister has had health issues requiring her to administer insulin that she has drawn up.  She is familiar with this and feels comfortable administering this to her sister.  I wrote for a sliding scale of regular insulin, supplies, glucometer and supplies and sent this to [facility].  We discussed the sliding scale in detail and I wrote down a copy for them as well.    Shilee will be here every day while receiving radiation and her sister knows to stop by our office if they have any questions about her blood sugar management.   All questions were answered. The patient knows to call the clinic with any problems, questions or concerns. We can certainly see the patient much sooner if necessary.  Total encounter time:30 minutes*in face-to-face visit time, chart review, lab review, care coordination, order entry, and documentation of the encounter time.  Wilber Bihari, NP 09/03/21 1:20 AM Medical Oncology and Hematology [facility] [place], [place] [address] Tel. [phone]    Fax. [phone]  *Total Encounter Time as defined by the Centers for Medicare and Medicaid Services includes, in addition to the face-to-face time of a patient visit (documented in the note above) non-face-to-face time: obtaining and reviewing outside history, ordering and reviewing medications, tests or procedures, care coordination (communications with other health care professionals or caregivers) and documentation in the medical record.

## 2021-09-01 ENCOUNTER — Other Ambulatory Visit: Payer: Self-pay

## 2021-09-01 ENCOUNTER — Other Ambulatory Visit (HOSPITAL_COMMUNITY): Payer: Self-pay

## 2021-09-01 ENCOUNTER — Ambulatory Visit
Admission: RE | Admit: 2021-09-01 | Discharge: 2021-09-01 | Disposition: A | Discharge status: Home / Self Care | Payer: Medicare Other | Source: Ambulatory Visit | Attending: Radiation Oncology | Admitting: Radiation Oncology

## 2021-09-01 DIAGNOSIS — Z5111 Encounter for antineoplastic chemotherapy: Secondary | ICD-10-CM | POA: Diagnosis not present

## 2021-09-01 DIAGNOSIS — C50211 Malignant neoplasm of upper-inner quadrant of right female breast: Secondary | ICD-10-CM | POA: Diagnosis not present

## 2021-09-01 DIAGNOSIS — Z79899 Other long term (current) drug therapy: Secondary | ICD-10-CM | POA: Diagnosis not present

## 2021-09-01 DIAGNOSIS — Z171 Estrogen receptor negative status [ER-]: Secondary | ICD-10-CM | POA: Diagnosis not present

## 2021-09-01 DIAGNOSIS — Z510 Encounter for antineoplastic radiation therapy: Secondary | ICD-10-CM | POA: Diagnosis not present

## 2021-09-01 DIAGNOSIS — C3412 Malignant neoplasm of upper lobe, left bronchus or lung: Secondary | ICD-10-CM | POA: Diagnosis not present

## 2021-09-01 LAB — RAD ONC ARIA SESSION SUMMARY
Course Elapsed Days: 16
Plan Fractions Treated to Date: 3
Plan Prescribed Dose Per Fraction: 2 Gy
Plan Total Fractions Prescribed: 30
Plan Total Prescribed Dose: 60 Gy
Reference Point Dosage Given to Date: 6 Gy
Reference Point Session Dosage Given: 2 Gy
Session Number: 8

## 2021-09-01 MED ORDER — ACETAMINOPHEN 325 MG PO TABS
ORAL_TABLET | ORAL | Status: AC
  Filled 2021-09-01: qty 2

## 2021-09-01 MED ORDER — CYANOCOBALAMIN 1000 MCG/ML IJ SOLN
INTRAMUSCULAR | Status: AC
  Filled 2021-09-01: qty 1

## 2021-09-01 MED ORDER — DIPHENHYDRAMINE HCL 25 MG PO CAPS
ORAL_CAPSULE | ORAL | Status: AC
  Filled 2021-09-01: qty 2

## 2021-09-01 NOTE — Progress Notes (Signed)
Pt here for patient teaching.  Pt given Radiation and You booklet, skin care instructions, and Sonafine.  Reviewed areas of pertinence such as fatigue, hair loss, skin changes, throat changes, cough, and shortness of breath . Pt able to give teach back of to pat skin and use unscented/gentle soap,apply Sonafine bid and avoid applying anything to skin within 4 hours of treatment. Pt verbalizes understanding of information given and will contact nursing with any questions or concerns.    Gloriajean Dell. Leonie Green, BSN

## 2021-09-02 ENCOUNTER — Ambulatory Visit
Admission: RE | Admit: 2021-09-02 | Discharge: 2021-09-02 | Disposition: A | Discharge status: Home / Self Care | Payer: Medicare Other | Source: Ambulatory Visit | Attending: Radiation Oncology | Admitting: Radiation Oncology

## 2021-09-02 ENCOUNTER — Other Ambulatory Visit: Payer: Self-pay | Admitting: Radiation Oncology

## 2021-09-02 ENCOUNTER — Other Ambulatory Visit: Payer: Self-pay

## 2021-09-02 ENCOUNTER — Other Ambulatory Visit (HOSPITAL_COMMUNITY): Payer: Self-pay

## 2021-09-02 ENCOUNTER — Encounter: Payer: Self-pay | Admitting: Nurse Practitioner

## 2021-09-02 ENCOUNTER — Inpatient Hospital Stay (HOSPITAL_BASED_OUTPATIENT_CLINIC_OR_DEPARTMENT_OTHER): Payer: Medicare Other | Admitting: Nurse Practitioner

## 2021-09-02 VITALS — BP 115/52 | HR 112 | Temp 98.2°F | Resp 14 | Ht 68.0 in | Wt 172.5 lb

## 2021-09-02 DIAGNOSIS — Z9012 Acquired absence of left breast and nipple: Secondary | ICD-10-CM | POA: Diagnosis not present

## 2021-09-02 DIAGNOSIS — Z515 Encounter for palliative care: Secondary | ICD-10-CM | POA: Diagnosis not present

## 2021-09-02 DIAGNOSIS — C3492 Malignant neoplasm of unspecified part of left bronchus or lung: Secondary | ICD-10-CM

## 2021-09-02 DIAGNOSIS — G9389 Other specified disorders of brain: Secondary | ICD-10-CM | POA: Diagnosis not present

## 2021-09-02 DIAGNOSIS — Z171 Estrogen receptor negative status [ER-]: Secondary | ICD-10-CM | POA: Diagnosis not present

## 2021-09-02 DIAGNOSIS — E782 Mixed hyperlipidemia: Secondary | ICD-10-CM | POA: Diagnosis not present

## 2021-09-02 DIAGNOSIS — R630 Anorexia: Secondary | ICD-10-CM

## 2021-09-02 DIAGNOSIS — Z9181 History of falling: Secondary | ICD-10-CM | POA: Diagnosis not present

## 2021-09-02 DIAGNOSIS — M8580 Other specified disorders of bone density and structure, unspecified site: Secondary | ICD-10-CM | POA: Diagnosis not present

## 2021-09-02 DIAGNOSIS — M1990 Unspecified osteoarthritis, unspecified site: Secondary | ICD-10-CM | POA: Diagnosis not present

## 2021-09-02 DIAGNOSIS — Z791 Long term (current) use of non-steroidal anti-inflammatories (NSAID): Secondary | ICD-10-CM | POA: Diagnosis not present

## 2021-09-02 DIAGNOSIS — Z7189 Other specified counseling: Secondary | ICD-10-CM | POA: Diagnosis not present

## 2021-09-02 DIAGNOSIS — Z853 Personal history of malignant neoplasm of breast: Secondary | ICD-10-CM | POA: Diagnosis not present

## 2021-09-02 DIAGNOSIS — I10 Essential (primary) hypertension: Secondary | ICD-10-CM | POA: Diagnosis not present

## 2021-09-02 DIAGNOSIS — D509 Iron deficiency anemia, unspecified: Secondary | ICD-10-CM | POA: Diagnosis not present

## 2021-09-02 DIAGNOSIS — C7931 Secondary malignant neoplasm of brain: Secondary | ICD-10-CM | POA: Diagnosis not present

## 2021-09-02 DIAGNOSIS — Z5111 Encounter for antineoplastic chemotherapy: Secondary | ICD-10-CM | POA: Diagnosis not present

## 2021-09-02 DIAGNOSIS — Z510 Encounter for antineoplastic radiation therapy: Secondary | ICD-10-CM | POA: Diagnosis not present

## 2021-09-02 DIAGNOSIS — R530 Neoplastic (malignant) related fatigue: Secondary | ICD-10-CM

## 2021-09-02 DIAGNOSIS — C3412 Malignant neoplasm of upper lobe, left bronchus or lung: Secondary | ICD-10-CM | POA: Diagnosis not present

## 2021-09-02 DIAGNOSIS — Z79899 Other long term (current) drug therapy: Secondary | ICD-10-CM | POA: Diagnosis not present

## 2021-09-02 DIAGNOSIS — C50211 Malignant neoplasm of upper-inner quadrant of right female breast: Secondary | ICD-10-CM | POA: Diagnosis not present

## 2021-09-02 LAB — RAD ONC ARIA SESSION SUMMARY
Course Elapsed Days: 17
Plan Fractions Treated to Date: 4
Plan Prescribed Dose Per Fraction: 2 Gy
Plan Total Fractions Prescribed: 30
Plan Total Prescribed Dose: 60 Gy
Reference Point Dosage Given to Date: 8 Gy
Reference Point Session Dosage Given: 2 Gy
Session Number: 9

## 2021-09-02 MED ORDER — GLUCOSE BLOOD VI STRP
ORAL_STRIP | 0 refills | Status: DC
  Filled 2021-09-02: qty 100, 25d supply, fill #0

## 2021-09-02 MED ORDER — SONAFINE EX EMUL
1.0000 | Freq: Once | CUTANEOUS | Status: AC
  Administered 2021-09-02: 1 via TOPICAL

## 2021-09-02 MED ORDER — ACCU-CHEK SOFTCLIX LANCETS MISC
0 refills | Status: DC
  Filled 2021-09-02: qty 100, 25d supply, fill #0

## 2021-09-02 MED ORDER — ACCU-CHEK GUIDE W/DEVICE KIT
PACK | 0 refills | Status: DC
  Filled 2021-09-02: qty 1, 30d supply, fill #0

## 2021-09-02 NOTE — Progress Notes (Signed)
[name]  Telephone:([phone]) [phone] Fax:(336) [phone]   Name: Jennifer Cowan Date: 09/02/2021 MRN: [id number]  DOB: Mar 27, 1945  Patient Care Team: [name], FNP as PCP - General (Family Medicine)    REASON FOR CONSULTATION: Jennifer Cowan is a 76 y.o. 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 Palliative ask to see for symptom management and goals of care.    SOCIAL HISTORY:     reports that she has never smoked. She has never used smokeless tobacco. She reports that she does not drink alcohol and does not use drugs.  ADVANCE DIRECTIVES:  Patient does not have an advanced directive however is interested in completing.   CODE STATUS:   PAST MEDICAL HISTORY: Past Medical History:  Diagnosis Date   Anemia    Arthritis    Breast cancer ([place])    Cancer ([place]) 1999   Breast Cancer   Hyperlipidemia    Hypertension    Osteopenia 01/12/2021    PAST SURGICAL HISTORY:  Past Surgical History:  Procedure Laterality Date   BREAST SURGERY  1999   Masectomy- left   BRONCHIAL BIOPSY  07/30/2021   Procedure: BRONCHIAL BIOPSIES;  Surgeon: Collene Gobble, MD;  Location[facility] ENDOSCOPY;  Service: Pulmonary;;   BRONCHIAL BRUSHINGS  07/30/2021   Procedure: BRONCHIAL BRUSHINGS;  Surgeon: Collene Gobble, MD;  Location: [facility] ENDOSCOPY;  Service: Pulmonary;;   BRONCHIAL NEEDLE ASPIRATION BIOPSY  07/30/2021   Procedure: BRONCHIAL NEEDLE ASPIRATION BIOPSIES;  Surgeon: Collene Gobble, MD;  Location: [facility] ENDOSCOPY;  Service: Pulmonary;;   CYSTECTOMY     cyst removed off top of head   IR IMAGING GUIDED PORT INSERTION  08/20/2021   MASTECTOMY     VIDEO BRONCHOSCOPY WITH RADIAL ENDOBRONCHIAL ULTRASOUND  07/30/2021   Procedure: VIDEO BRONCHOSCOPY WITH RADIAL ENDOBRONCHIAL ULTRASOUND;  Surgeon: Collene Gobble, MD;  Location: [facility] ENDOSCOPY;  Service: Pulmonary;;    HEMATOLOGY/ONCOLOGY HISTORY:  Oncology History  Malignant neoplasm of unspecified part of unspecified bronchus or lung ([place])  08/13/2021 Initial Diagnosis   Malignant neoplasm of unspecified part of unspecified bronchus or lung ([place])   08/30/2021 -  Chemotherapy   Patient is on Treatment Plan : LUNG Carboplatin / Paclitaxel + XRT q7d       ALLERGIES:  has No Known Allergies.  MEDICATIONS:  Current Outpatient Medications  Medication Sig Dispense Refill   acetaminophen (TYLENOL) 500 MG tablet Take 1,000 mg by mouth every 6 (six) hours as needed for moderate pain.     amLODipine (NORVASC) 10 MG tablet TAKE 1 TABLET BY MOUTH DAILY 90 tablet 3   benazepril (LOTENSIN) 10 MG tablet TAKE 1 TABLET BY MOUTH DAILY 90 tablet 3   blood glucose meter kit and supplies KIT Dispense based on patient and insurance preference. Use up to four times daily as directed. 1 each 0   Cholecalciferol (VITAMIN D3) 125 MCG (5000 UT) CAPS Take 5,000 Units by mouth daily.     dexamethasone (DECADRON) 4 MG tablet Take 1 tablet (4 mg total) by mouth 3 (three) times daily. 90 tablet 1   ezetimibe (ZETIA) 10 MG tablet Take 1 tablet (10 mg total) by mouth daily. 90 tablet 3   glucose 4 GM chewable tablet Chew 1 tablet (4 g total) by mouth as needed for low blood sugar. 50 tablet 0   insulin regular (  NOVOLIN R) 100 units/mL injection If blood sugar is 200 or less: 0 units 201-250: 2 units 251-300: 4 units 301-350: 6 units 351-400: 8 units 401 or higher: 10 units and call MD 10 mL 1   Insulin Syringe-Needle U-100 (INSULIN SYRINGE .3CC/29GX1") 29G X 1" 0.3 ML MISC 1 Dose by Does not apply route 3 (three) times daily before meals. 90 each 1   lidocaine-prilocaine (EMLA) cream Apply to affected area once 30 g 3   LORazepam (ATIVAN) 0.5 MG tablet 1 tab po 30 minutes prior to radiation or MRI scans 30 tablet 0   meloxicam (MOBIC) 7.5 MG tablet TAKE 1 TABLET BY MOUTH  DAILY 90 tablet 3   ondansetron (ZOFRAN) 8 MG tablet Take 1 tablet (8 mg total) by mouth 2 (two) times daily as needed for refractory nausea / vomiting. Start on day 3 after chemo. 30 tablet 1   prochlorperazine (COMPAZINE) 10 MG tablet Take 1 tablet (10 mg total) by mouth every 6 (six) hours as needed for nausea or vomiting. 30 tablet 1   simvastatin (ZOCOR) 40 MG tablet TAKE 1 TABLET BY MOUTH DAILY 90 tablet 3   trolamine salicylate (ASPERCREME) 10 % cream Apply 1 Application topically daily as needed for muscle pain.     No current facility-administered medications for this visit.   Facility-Administered Medications Ordered in Other Visits  Medication Dose Route Frequency Provider Last Rate Last Admin   acetaminophen (TYLENOL) 325 MG tablet            cyanocobalamin (VITAMIN B12) 1000 MCG/ML injection            diphenhydrAMINE (BENADRYL) 25 mg capsule             VITAL SIGNS: BP (!) 115/52 (BP Location: Right Arm, Patient Position: Sitting) Comment: nurse notified  Pulse (!) 112 Comment: nurse notified  Temp 98.2 F (36.8 C) (Oral)   Resp 14   Ht $R'5\' 8"'Ki$  (1.727 m)   Wt 172 lb 8 oz (78.2 kg)   SpO2 98%   BMI 26.23 kg/m  Filed Weights   09/02/21 1024  Weight: 172 lb 8 oz (78.2 kg)    Estimated body mass index is 26.23 kg/m as calculated from the following:   Height as of this encounter: $RemoveBeforeD'5\' 8"'OeSdjhXCZhgXGo$  (1.727 m).   Weight as of this encounter: 172 lb 8 oz (78.2 kg).  LABS: CBC:    Component Value Date/Time   WBC 9.4 08/30/2021 1306   WBC 6.7 07/25/2021 0111   HGB 14.4 08/30/2021 1306   HGB 15.7 07/07/2021 1538   HCT 42.0 08/30/2021 1306   HCT 46.6 07/07/2021 1538   PLT 147 (L) 08/30/2021 1306   PLT 450 07/07/2021 1538   MCV 88.4 08/30/2021 1306   MCV 88 07/07/2021 1538   NEUTROABS 8.4 (H) 08/30/2021 1306   NEUTROABS 3.7 07/07/2021 1538   LYMPHSABS 0.4 (L) 08/30/2021 1306   LYMPHSABS 1.6 07/07/2021 1538   MONOABS 0.6 08/30/2021 1306   EOSABS 0.0 08/30/2021 1306   EOSABS  0.1 07/07/2021 1538   BASOSABS 0.0 08/30/2021 1306   BASOSABS 0.0 07/07/2021 1538   Comprehensive Metabolic Panel:    Component Value Date/Time   NA 134 (L) 08/31/2021 1349   NA 141 07/07/2021 1538   K 4.1 08/31/2021 1349   CL 102 08/31/2021 1349   CO2 27 08/31/2021 1349   BUN 21 08/31/2021 1349   BUN 9 07/07/2021 1538   CREATININE 0.62 08/31/2021 1349   GLUCOSE 389 (H)  08/31/2021 1349   CALCIUM 9.1 08/31/2021 1349   AST 15 08/30/2021 1306   ALT 37 08/30/2021 1306   ALKPHOS 60 08/30/2021 1306   BILITOT 0.8 08/30/2021 1306   PROT 5.9 (L) 08/30/2021 1306   PROT 7.9 07/07/2021 1538   ALBUMIN 3.6 08/30/2021 1306   ALBUMIN 4.6 07/07/2021 1538    RADIOGRAPHIC STUDIES: NM PET Image Restag (PS) Skull Base To Thigh  Result Date: 08/19/2021 CLINICAL DATA:  Initial treatment strategy for non-small cell lung cancer with metastatic brain lesions. Newly diagnosed right breast cancer. IMPRESSION: 1. Hypermetabolic partially cavitary left upper lobe pulmonary mass is consistent with known primary bronchogenic neoplasm. 2. Hypermetabolic mediastinal and left hilar lymph nodes are consistent with nodal disease involvement. 3. Hypermetabolic right breast mass is consistent with the patient's known primary breast neoplasm. 4. No evidence of distant hypermetabolic metastatic disease in the neck, chest, abdomen or pelvis. 5. Bilateral nonobstructive renal calculi measuring up to 21 mm. 6.  Aortic Atherosclerosis (ICD10-I70.0). Electronically Signed   By: Dahlia Bailiff M.D.   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 Metastatic disease evaluation; 3T SRS Protocol for radiation treatment planning. IMPRESSION: Six enhancing lesions identified, consistent with metastatic disease to the brain, as detailed above. Significant mass effect related to the right temporal and left frontal lesions with surrounding vasogenic  edema. A 2 mm leftward midline shift, improved from 4 mm on prior MRI. Electronically Signed   By: Pedro Earls M.D.   On: 08/06/2021 17:05    PERFORMANCE STATUS (ECOG) : 0 - Asymptomatic  Review of Systems  Neurological:  Positive for weakness.  Unless otherwise noted, a complete review of systems is negative.  Physical Exam General: NAD, in wheelchair  Cardiovascular: regular rate and rhythm Pulmonary: normal breathing pattern  Abdomen: soft, nontender, + bowel sounds GU: no suprapubic tenderness Extremities: no edema, no joint deformities Skin: no rashes Neurological: Weakness but otherwise nonfocal  IMPRESSION: This is my  initial visit with Jennifer Cowan in the clinic. She was recently hospitalized and seen by our Palliative team. No acute distress noted. Sitting up in wheelchair. Her niece, Jennifer Cowan and sister, Jennifer Cowan are with her today. Patient is alert, delayed response however is engage in discussions.   I introduced myself, Jennifer Cowan, and Palliative's role in collaboration with the oncology team. Concept of Palliative Care was introduced as specialized medical care for people and their families living with serious illness.  It focuses on providing relief from the symptoms and stress of a serious illness.  The goal is to improve quality of life for both the patient and the family. Values and goals of care important to patient and family were attempted to be elicited.   Jennifer Cowan lives in the home with her sister, however since recent cancer diagnosis and hospitalization she has been staying with her niece. Requires assistance with ADLs. Ambulatory with standby assist. Family reports home health (PT x2/week and SLP) are involved. She is currently on insulin due to steroid use. She has a wheelchair, walker, and bedside commode in the home. Family shares she has several small ulcerations to her sacrum however they are healing as they are caring for them.   Jennifer Cowan denies  nausea, vomiting, constipation, diarrhea, pain or discomfort. No symptom management needs at this time.   Appetite has been good. They are watching her food choices due to use of insulin  and hyperglycemia. Some complaints of fatigue since starting treatment.   We discussed her current illness and what it means in the larger context of Her on-going co-morbidities. Natural disease trajectory and expectations were discussed.  Family are clear in their expressed understanding of patient's current illness and plan of care. They are remaining hopeful for stability and some success with treatment options.   I approached discussions regarding advanced directives. Jennifer Cowan does not have completed documents however she is interested in completing. Education provided on the AD clinic. Family has been provided with advanced directive packet as well as the contact information to schedule an appointment.   I discussed the importance of continued conversation with family and their medical providers regarding overall plan of care and treatment options, ensuring decisions are within the context of the patients values and GOCs.  PLAN: Established therapeutic relationship. Introduced palliative's role in collaboration with her oncology/radiation team.  Ongoing goals of care Family provided with advanced directive packet and contact information to arrange appointment with AD clinic for completion.  I will plan to see patient back in 2-4 weeks in collaboration to other oncology appointments.    Patient expressed understanding and was in agreement with this plan. She also understands that She can call the clinic at any time with any questions, concerns, or complaints.   Thank you for your referral and allowing Palliative to assist in Mr./Mrs. Jennifer Cowan's care.   Number and complexity of problems addressed: 1 HIGH - 1 or more chronic illnesses with SEVERE exacerbation, progression, or side effects of treatment -  advanced cancer, pain. Any controlled substances utilized were prescribed in the context of palliative care.  Time Total: 45 min   Visit consisted of counseling and education dealing with the complex and emotionally intense issues of symptom management and palliative care in the setting of serious and potentially life-threatening illness.Greater than 50%  of this time was spent counseling and coordinating care related to the above assessment and plan.  Signed by: Alda Lea, AGPCNP-BC Palliative Medicine Team/[place]

## 2021-09-03 ENCOUNTER — Encounter: Payer: Self-pay | Admitting: Hematology and Oncology

## 2021-09-03 ENCOUNTER — Other Ambulatory Visit: Payer: Self-pay

## 2021-09-03 ENCOUNTER — Telehealth: Payer: Self-pay | Admitting: Family Medicine

## 2021-09-03 ENCOUNTER — Other Ambulatory Visit (HOSPITAL_COMMUNITY): Payer: Self-pay

## 2021-09-03 ENCOUNTER — Ambulatory Visit
Admission: RE | Admit: 2021-09-03 | Discharge: 2021-09-03 | Disposition: A | Discharge status: Home / Self Care | Payer: Medicare Other | Source: Ambulatory Visit | Attending: Radiation Oncology | Admitting: Radiation Oncology

## 2021-09-03 DIAGNOSIS — Z171 Estrogen receptor negative status [ER-]: Secondary | ICD-10-CM | POA: Diagnosis not present

## 2021-09-03 DIAGNOSIS — Z79899 Other long term (current) drug therapy: Secondary | ICD-10-CM | POA: Diagnosis not present

## 2021-09-03 DIAGNOSIS — C50211 Malignant neoplasm of upper-inner quadrant of right female breast: Secondary | ICD-10-CM | POA: Diagnosis not present

## 2021-09-03 DIAGNOSIS — C3412 Malignant neoplasm of upper lobe, left bronchus or lung: Secondary | ICD-10-CM | POA: Diagnosis not present

## 2021-09-03 DIAGNOSIS — Z510 Encounter for antineoplastic radiation therapy: Secondary | ICD-10-CM | POA: Diagnosis not present

## 2021-09-03 DIAGNOSIS — Z5111 Encounter for antineoplastic chemotherapy: Secondary | ICD-10-CM | POA: Diagnosis not present

## 2021-09-03 LAB — RAD ONC ARIA SESSION SUMMARY
Course Elapsed Days: 18
Plan Fractions Treated to Date: 5
Plan Prescribed Dose Per Fraction: 2 Gy
Plan Total Fractions Prescribed: 30
Plan Total Prescribed Dose: 60 Gy
Reference Point Dosage Given to Date: 10 Gy
Reference Point Session Dosage Given: 2 Gy
Session Number: 10

## 2021-09-03 NOTE — Assessment & Plan Note (Signed)
Kaitlynd is a 76 year old woman undergoing chemoradiation for her metastatic lung cancer.  She has treatment related hyperglycemia with sugars as high as 522.    Her blood sugar today is 389 which is better than prior.  Her sister has had health issues requiring her to administer insulin that she has drawn up.  She is familiar with this and feels comfortable administering this to her sister.  I wrote for a sliding scale of regular insulin, supplies, glucometer and supplies and sent this to [facility].  We discussed the sliding scale in detail and I wrote down a copy for them as well.

## 2021-09-03 NOTE — Telephone Encounter (Signed)
I spoke to the pt's sister and they had called the oncologist that put the pt on sliding scale insulin the night it occurred and the oncologist gave them instructions on how to proceed. Jennifer Cowan is aware and is ok with the family to continue to contact oncology if this occurs again.

## 2021-09-06 ENCOUNTER — Ambulatory Visit
Admission: RE | Admit: 2021-09-06 | Discharge: 2021-09-06 | Disposition: A | Discharge status: Home / Self Care | Payer: Medicare Other | Source: Ambulatory Visit | Attending: Radiation Oncology | Admitting: Radiation Oncology

## 2021-09-06 ENCOUNTER — Other Ambulatory Visit: Payer: Self-pay

## 2021-09-06 DIAGNOSIS — Z510 Encounter for antineoplastic radiation therapy: Secondary | ICD-10-CM | POA: Diagnosis not present

## 2021-09-06 DIAGNOSIS — Z171 Estrogen receptor negative status [ER-]: Secondary | ICD-10-CM | POA: Diagnosis not present

## 2021-09-06 DIAGNOSIS — Z5111 Encounter for antineoplastic chemotherapy: Secondary | ICD-10-CM | POA: Diagnosis not present

## 2021-09-06 DIAGNOSIS — Z79899 Other long term (current) drug therapy: Secondary | ICD-10-CM | POA: Diagnosis not present

## 2021-09-06 DIAGNOSIS — C3412 Malignant neoplasm of upper lobe, left bronchus or lung: Secondary | ICD-10-CM | POA: Diagnosis not present

## 2021-09-06 DIAGNOSIS — C50211 Malignant neoplasm of upper-inner quadrant of right female breast: Secondary | ICD-10-CM | POA: Diagnosis not present

## 2021-09-06 LAB — RAD ONC ARIA SESSION SUMMARY
Course Elapsed Days: 21
Plan Fractions Treated to Date: 6
Plan Prescribed Dose Per Fraction: 2 Gy
Plan Total Fractions Prescribed: 30
Plan Total Prescribed Dose: 60 Gy
Reference Point Dosage Given to Date: 12 Gy
Reference Point Session Dosage Given: 2 Gy
Session Number: 11

## 2021-09-06 MED FILL — Dexamethasone Sodium Phosphate Inj 100 MG/10ML: INTRAMUSCULAR | Qty: 1 | Status: AC

## 2021-09-07 ENCOUNTER — Ambulatory Visit
Admission: RE | Admit: 2021-09-07 | Discharge: 2021-09-07 | Disposition: A | Discharge status: Home / Self Care | Payer: Medicare Other | Source: Ambulatory Visit | Attending: Radiation Oncology | Admitting: Radiation Oncology

## 2021-09-07 ENCOUNTER — Inpatient Hospital Stay: Payer: Medicare Other

## 2021-09-07 ENCOUNTER — Other Ambulatory Visit: Payer: Self-pay

## 2021-09-07 ENCOUNTER — Encounter: Payer: Self-pay | Admitting: Hematology and Oncology

## 2021-09-07 ENCOUNTER — Inpatient Hospital Stay (HOSPITAL_BASED_OUTPATIENT_CLINIC_OR_DEPARTMENT_OTHER): Payer: Medicare Other | Admitting: Hematology and Oncology

## 2021-09-07 VITALS — BP 103/65 | HR 103 | Temp 97.7°F | Resp 18 | Ht 68.0 in | Wt 171.2 lb

## 2021-09-07 DIAGNOSIS — C3490 Malignant neoplasm of unspecified part of unspecified bronchus or lung: Secondary | ICD-10-CM | POA: Diagnosis not present

## 2021-09-07 DIAGNOSIS — C7931 Secondary malignant neoplasm of brain: Secondary | ICD-10-CM | POA: Diagnosis not present

## 2021-09-07 DIAGNOSIS — R739 Hyperglycemia, unspecified: Secondary | ICD-10-CM

## 2021-09-07 DIAGNOSIS — Z5111 Encounter for antineoplastic chemotherapy: Secondary | ICD-10-CM | POA: Diagnosis not present

## 2021-09-07 DIAGNOSIS — Z7189 Other specified counseling: Secondary | ICD-10-CM

## 2021-09-07 DIAGNOSIS — C3492 Malignant neoplasm of unspecified part of left bronchus or lung: Secondary | ICD-10-CM | POA: Diagnosis not present

## 2021-09-07 DIAGNOSIS — Z79899 Other long term (current) drug therapy: Secondary | ICD-10-CM | POA: Diagnosis not present

## 2021-09-07 DIAGNOSIS — C3412 Malignant neoplasm of upper lobe, left bronchus or lung: Secondary | ICD-10-CM | POA: Diagnosis not present

## 2021-09-07 DIAGNOSIS — Z171 Estrogen receptor negative status [ER-]: Secondary | ICD-10-CM | POA: Diagnosis not present

## 2021-09-07 DIAGNOSIS — Z95828 Presence of other vascular implants and grafts: Secondary | ICD-10-CM

## 2021-09-07 DIAGNOSIS — C50211 Malignant neoplasm of upper-inner quadrant of right female breast: Secondary | ICD-10-CM | POA: Diagnosis not present

## 2021-09-07 DIAGNOSIS — Z510 Encounter for antineoplastic radiation therapy: Secondary | ICD-10-CM | POA: Diagnosis not present

## 2021-09-07 LAB — CBC WITH DIFFERENTIAL (CANCER CENTER ONLY)
Abs Immature Granulocytes: 0.07 10*3/uL (ref 0.00–0.07)
Basophils Absolute: 0 10*3/uL (ref 0.0–0.1)
Basophils Relative: 0 %
Eosinophils Absolute: 0 10*3/uL (ref 0.0–0.5)
Eosinophils Relative: 0 %
HCT: 39.4 % (ref 36.0–46.0)
Hemoglobin: 13.7 g/dL (ref 12.0–15.0)
Immature Granulocytes: 2 %
Lymphocytes Relative: 6 %
Lymphs Abs: 0.3 10*3/uL — ABNORMAL LOW (ref 0.7–4.0)
MCH: 29.9 pg (ref 26.0–34.0)
MCHC: 34.8 g/dL (ref 30.0–36.0)
MCV: 86 fL (ref 80.0–100.0)
Monocytes Absolute: 0.3 10*3/uL (ref 0.1–1.0)
Monocytes Relative: 7 %
Neutro Abs: 3.9 10*3/uL (ref 1.7–7.7)
Neutrophils Relative %: 85 %
Platelet Count: 220 10*3/uL (ref 150–400)
RBC: 4.58 MIL/uL (ref 3.87–5.11)
RDW: 13.8 % (ref 11.5–15.5)
WBC Count: 4.6 10*3/uL (ref 4.0–10.5)
nRBC: 0 % (ref 0.0–0.2)

## 2021-09-07 LAB — CMP (CANCER CENTER ONLY)
ALT: 30 U/L (ref 0–44)
AST: 17 U/L (ref 15–41)
Albumin: 3.5 g/dL (ref 3.5–5.0)
Alkaline Phosphatase: 57 U/L (ref 38–126)
Anion gap: 6 (ref 5–15)
BUN: 14 mg/dL (ref 8–23)
CO2: 28 mmol/L (ref 22–32)
Calcium: 9.4 mg/dL (ref 8.9–10.3)
Chloride: 101 mmol/L (ref 98–111)
Creatinine: 0.59 mg/dL (ref 0.44–1.00)
GFR, Estimated: >60 mL/min (ref >60)
Glucose, Bld: 426 mg/dL — ABNORMAL HIGH (ref 70–99)
Potassium: 3.7 mmol/L (ref 3.5–5.1)
Sodium: 135 mmol/L (ref 135–145)
Total Bilirubin: 0.6 mg/dL (ref 0.3–1.2)
Total Protein: 5.8 g/dL — ABNORMAL LOW (ref 6.5–8.1)

## 2021-09-07 LAB — RAD ONC ARIA SESSION SUMMARY
Course Elapsed Days: 22
Plan Fractions Treated to Date: 7
Plan Prescribed Dose Per Fraction: 2 Gy
Plan Total Fractions Prescribed: 30
Plan Total Prescribed Dose: 60 Gy
Reference Point Dosage Given to Date: 14 Gy
Reference Point Session Dosage Given: 2 Gy
Session Number: 12

## 2021-09-07 MED ORDER — SODIUM CHLORIDE 0.9% FLUSH
10.0000 mL | Freq: Once | INTRAVENOUS | Status: AC
  Administered 2021-09-07: 10 mL

## 2021-09-07 NOTE — Assessment & Plan Note (Signed)
This is highly concerning for metastatic disease from a primary lung cancer.  She is now status post SRS.  She will continue to follow-up with radiation oncology. She is currently on dexamethasone 4 mg p.o. 3 times daily

## 2021-09-07 NOTE — Progress Notes (Signed)
[name] Follow up:    Jennifer Cowan, [place] [address]  DIAGNOSIS: lung cancer  SUMMARY OF ONCOLOGIC HISTORY: Oncology History  Malignant neoplasm of unspecified part of unspecified bronchus or lung ([place])  08/13/2021 Initial Diagnosis   Malignant neoplasm of unspecified part of unspecified bronchus or lung ([place])   08/30/2021 -  Chemotherapy   Patient is on Treatment Plan : LUNG Carboplatin / Paclitaxel + XRT q7d       CURRENT THERAPY: chemoradiation  INTERVAL HISTORY:  Jennifer Cowan 76 y.o. female returns for f/u. Jennifer Cowan is here with her sister and her niece.  She tells me that she is tired and would like to take a break.  She for most of the conversation was silent and when inquired into why she is not active today, she says her body is tired. She will proceed with radiation, return to clinic before next week's chemotherapy appointment to reevaluate.  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  MEDICAL HISTORY: Past Medical History:  Diagnosis Date   Anemia    Arthritis    Breast cancer ([place])    Cancer ([place]) 1999   Breast Cancer   Hyperlipidemia    Hypertension    Osteopenia 01/12/2021    SURGICAL HISTORY: Past Surgical History:  Procedure Laterality Date    BREAST SURGERY  1999   Masectomy- left   BRONCHIAL BIOPSY  07/30/2021   Procedure: BRONCHIAL BIOPSIES;  Surgeon: Collene Gobble, MD;  Location: [facility] ENDOSCOPY;  Service: Pulmonary;;   BRONCHIAL BRUSHINGS  07/30/2021   Procedure: BRONCHIAL BRUSHINGS;  Surgeon: Collene Gobble, MD;  Location: [facility] ENDOSCOPY;  Service: Pulmonary;;   BRONCHIAL NEEDLE ASPIRATION BIOPSY  07/30/2021   Procedure: BRONCHIAL NEEDLE ASPIRATION BIOPSIES;  Surgeon: Collene Gobble, MD;  Location: [facility] ENDOSCOPY;  Service: Pulmonary;;   CYSTECTOMY     cyst removed off top of head   IR IMAGING GUIDED PORT INSERTION  08/20/2021   MASTECTOMY     VIDEO BRONCHOSCOPY WITH RADIAL ENDOBRONCHIAL ULTRASOUND  07/30/2021   Procedure: VIDEO BRONCHOSCOPY WITH RADIAL ENDOBRONCHIAL ULTRASOUND;  Surgeon: Collene Gobble, MD;  Location: [facility] ENDOSCOPY;  Service: Pulmonary;;    SOCIAL HISTORY: Social History   Socioeconomic History   Marital status: Single    Spouse name: Not on file   Number of children: 0   Years of education: 12   Highest education level: High school graduate  Occupational History   Occupation: retired  Tobacco Use   Smoking status: Never   Smokeless tobacco: Never  Vaping Use   Vaping Use: Never used  Substance and Sexual Activity   Alcohol use: No   Drug use: Never   Sexual activity: Yes    Birth control/protection: Post-menopausal  Other Topics Concern   Not on file  Social  History Narrative   Not on file   Social Determinants of Health   Financial Resource Strain: Not on file  Food Insecurity: Not on file  Transportation Needs: Not on file  Physical Activity: Not on file  Stress: Not on file  Social Connections: Not on file  Intimate Partner Violence: Not on file    FAMILY HISTORY: Family History  Problem Relation Age of Onset   Arthritis Mother    Breast cancer Sister    Cancer Sister        Breast Cancer   Diabetes Sister    Hyperlipidemia Sister    Hypertension Sister    Stroke Sister     Diabetes Brother    Hypertension Brother     Review of Systems  Constitutional:  Positive for fatigue. Negative for appetite change, chills, fever and unexpected weight change.  HENT:   Negative for hearing loss, lump/mass and trouble swallowing.   Eyes:  Negative for eye problems and icterus.  Respiratory:  Negative for chest tightness, cough and shortness of breath.   Cardiovascular:  Negative for chest pain, leg swelling and palpitations.  Gastrointestinal:  Negative for abdominal distention, abdominal pain, constipation, diarrhea, nausea and vomiting.  Endocrine: Negative for hot flashes.  Genitourinary:  Negative for difficulty urinating.   Musculoskeletal:  Negative for arthralgias.  Skin:  Negative for itching and rash.  Neurological:  Negative for dizziness, extremity weakness, headaches and numbness.  Hematological:  Negative for adenopathy. Does not bruise/bleed easily.  Psychiatric/Behavioral:  Negative for depression. The patient is not nervous/anxious.       PHYSICAL EXAMINATION  ECOG PERFORMANCE STATUS: 2 - Symptomatic, <50% confined to bed  Vitals:   09/07/21 1250  BP: 103/65  Pulse: (!) 103  Resp: 18  Temp: 97.7 F (36.5 C)  SpO2: 99%    Physical Exam Constitutional:      General: She is not in acute distress.    Appearance: Normal appearance. She is not toxic-appearing.     Comments: Appears tired and flat affect  HENT:     Head: Normocephalic and atraumatic.  Eyes:     General: No scleral icterus. Cardiovascular:     Rate and Rhythm: Normal rate and regular rhythm.     Pulses: Normal pulses.     Heart sounds: Normal heart sounds.  Pulmonary:     Effort: Pulmonary effort is normal.     Breath sounds: Normal breath sounds.  Chest:     Comments: Palpable right breast mass occupying pretty much the entire central portion of the breast.  No significant change since last visit Abdominal:     General: Abdomen is flat. Bowel sounds are normal. There  is no distension.     Palpations: Abdomen is soft.     Tenderness: There is no abdominal tenderness.  Musculoskeletal:        General: No swelling.     Cervical back: Neck supple.  Lymphadenopathy:     Cervical: No cervical adenopathy.  Skin:    General: Skin is warm and dry.     Findings: No rash.  Neurological:     General: No focal deficit present.     Mental Status: She is alert.  Psychiatric:        Mood and Affect: Mood normal.        Behavior: Behavior normal.     LABORATORY DATA:  CBC    Component Value Date/Time   WBC 4.6 09/07/2021 1212   WBC 6.7 07/25/2021 0111  RBC 4.58 09/07/2021 1212   HGB 13.7 09/07/2021 1212   HGB 15.7 07/07/2021 1538   HCT 39.4 09/07/2021 1212   HCT 46.6 07/07/2021 1538   PLT 220 09/07/2021 1212   PLT 450 07/07/2021 1538   MCV 86.0 09/07/2021 1212   MCV 88 07/07/2021 1538   MCH 29.9 09/07/2021 1212   MCHC 34.8 09/07/2021 1212   RDW 13.8 09/07/2021 1212   RDW 12.9 07/07/2021 1538   LYMPHSABS 0.3 (L) 09/07/2021 1212   LYMPHSABS 1.6 07/07/2021 1538   MONOABS 0.3 09/07/2021 1212   EOSABS 0.0 09/07/2021 1212   EOSABS 0.1 07/07/2021 1538   BASOSABS 0.0 09/07/2021 1212   BASOSABS 0.0 07/07/2021 1538    CMP     Component Value Date/Time   NA 135 09/07/2021 1212   NA 141 07/07/2021 1538   K 3.7 09/07/2021 1212   CL 101 09/07/2021 1212   CO2 28 09/07/2021 1212   GLUCOSE 426 (H) 09/07/2021 1212   BUN 14 09/07/2021 1212   BUN 9 07/07/2021 1538   CREATININE 0.59 09/07/2021 1212   CALCIUM 9.4 09/07/2021 1212   PROT 5.8 (L) 09/07/2021 1212   PROT 7.9 07/07/2021 1538   ALBUMIN 3.5 09/07/2021 1212   ALBUMIN 4.6 07/07/2021 1538   AST 17 09/07/2021 1212   ALT 30 09/07/2021 1212   ALKPHOS 57 09/07/2021 1212   BILITOT 0.6 09/07/2021 1212   GFRNONAA >60 09/07/2021 1212   GFRAA 103 03/05/2020 1119     ASSESSMENT and THERAPY PLAN:   Malignant neoplasm of unspecified part of unspecified bronchus or lung ([place]) This is a  76 year old patient with 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  She also appears to have had some regional adenopathy including prevascular, AP window and left hilar adenopathy.  She has multiple intracranial metastatic lesions which are likely secondary to metastatic lung cancer given presence of regional adenopathy.  She is now status post SRS to the brain lesions  She is started concurrent chemoradiation with CarboTaxol on 814 and is here before planned week 2 of CarboTaxol.  And she remained mute for most part of the conversation, would not answer questions appropriately which is not quite her usual behavior.  When inquired into the details, she tells me that her body is tired.  And I gave her the option of omitting chemotherapy this week and just continuing radiation, she was more conversant and felt well with this current plan.   We will omit chemotherapy planned for today.  She will return to clinic on 28th before her planned week 3 of chemotherapy. If she continues to feel unwell and does not want to proceed with chemotherapy, at that point we will consider palliative radiation alone and we have discussed about progression of cancer eventually when we may have to focus on goals of care.  She expressed understanding of the plan.  Metastasis to brain [facility]) This is highly concerning for metastatic disease from a primary lung cancer.  She is now status post SRS.  She will continue to follow-up with radiation oncology. She is currently on dexamethasone 4 mg p.o. 3 times daily  Hyperglycemia  Hyperglycemia secondary to dexamethasone use she is currently on dexamethasone half milligram once a day, following taper as instructed by radiation oncology. We encouraged her to follow up with her PCP for management of diabetes  Total encounter time:40 minutes*in face-to-face visit time, chart review, lab review, care  coordination, order  entry, and documentation of the encounter time.  *Total Encounter Time as defined by the Centers for Medicare and Medicaid Services includes, in addition to the face-to-face time of a patient visit (documented in the note above) non-face-to-face time: obtaining and reviewing outside history, ordering and reviewing medications, tests or procedures, care coordination (communications with other health care professionals or caregivers) and documentation in the medical record.

## 2021-09-07 NOTE — Assessment & Plan Note (Addendum)
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  She also appears to have had some regional adenopathy including prevascular, AP window and left hilar adenopathy.  She has multiple intracranial metastatic lesions which are likely secondary to metastatic lung cancer given presence of regional adenopathy.  She is now status post SRS to the brain lesions  She is started concurrent chemoradiation with CarboTaxol on 814 and is here before planned week 2 of CarboTaxol.  And she remained mute for most part of the conversation, would not answer questions appropriately which is not quite her usual behavior.  When inquired into the details, she tells me that her body is tired.  And I gave her the option of omitting chemotherapy this week and just continuing radiation, she was more conversant and felt well with this current plan.   We will omit chemotherapy planned for today.  She will return to clinic on 28th before her planned week 3 of chemotherapy. If she continues to feel unwell and does not want to proceed with chemotherapy, at that point we will consider palliative radiation alone and we have discussed about progression of cancer eventually when we may have to focus on goals of care.  She expressed understanding of the plan.

## 2021-09-07 NOTE — Assessment & Plan Note (Addendum)
  Hyperglycemia secondary to dexamethasone use she is currently on dexamethasone half milligram once a day, following taper as instructed by radiation oncology. We encouraged her to follow up with her PCP for management of diabetes

## 2021-09-08 ENCOUNTER — Other Ambulatory Visit: Payer: Self-pay

## 2021-09-08 ENCOUNTER — Encounter: Payer: Self-pay | Admitting: Family Medicine

## 2021-09-08 ENCOUNTER — Ambulatory Visit
Admission: RE | Admit: 2021-09-08 | Discharge: 2021-09-08 | Disposition: A | Discharge status: Home / Self Care | Payer: Medicare Other | Source: Ambulatory Visit | Attending: Radiation Oncology | Admitting: Radiation Oncology

## 2021-09-08 ENCOUNTER — Ambulatory Visit (INDEPENDENT_AMBULATORY_CARE_PROVIDER_SITE_OTHER): Payer: Medicare Other | Admitting: Family Medicine

## 2021-09-08 VITALS — BP 104/52 | HR 108 | Temp 97.5°F | Ht 68.0 in | Wt 172.1 lb

## 2021-09-08 DIAGNOSIS — Z791 Long term (current) use of non-steroidal anti-inflammatories (NSAID): Secondary | ICD-10-CM | POA: Diagnosis not present

## 2021-09-08 DIAGNOSIS — C3412 Malignant neoplasm of upper lobe, left bronchus or lung: Secondary | ICD-10-CM | POA: Diagnosis not present

## 2021-09-08 DIAGNOSIS — Z853 Personal history of malignant neoplasm of breast: Secondary | ICD-10-CM | POA: Diagnosis not present

## 2021-09-08 DIAGNOSIS — D509 Iron deficiency anemia, unspecified: Secondary | ICD-10-CM | POA: Diagnosis not present

## 2021-09-08 DIAGNOSIS — C50211 Malignant neoplasm of upper-inner quadrant of right female breast: Secondary | ICD-10-CM | POA: Diagnosis not present

## 2021-09-08 DIAGNOSIS — I10 Essential (primary) hypertension: Secondary | ICD-10-CM | POA: Diagnosis not present

## 2021-09-08 DIAGNOSIS — Z171 Estrogen receptor negative status [ER-]: Secondary | ICD-10-CM | POA: Diagnosis not present

## 2021-09-08 DIAGNOSIS — E1165 Type 2 diabetes mellitus with hyperglycemia: Secondary | ICD-10-CM | POA: Diagnosis not present

## 2021-09-08 DIAGNOSIS — Z5111 Encounter for antineoplastic chemotherapy: Secondary | ICD-10-CM | POA: Diagnosis not present

## 2021-09-08 DIAGNOSIS — Z510 Encounter for antineoplastic radiation therapy: Secondary | ICD-10-CM | POA: Diagnosis not present

## 2021-09-08 DIAGNOSIS — L89321 Pressure ulcer of left buttock, stage 1: Secondary | ICD-10-CM

## 2021-09-08 DIAGNOSIS — Z9012 Acquired absence of left breast and nipple: Secondary | ICD-10-CM | POA: Diagnosis not present

## 2021-09-08 DIAGNOSIS — M8580 Other specified disorders of bone density and structure, unspecified site: Secondary | ICD-10-CM | POA: Diagnosis not present

## 2021-09-08 DIAGNOSIS — M1990 Unspecified osteoarthritis, unspecified site: Secondary | ICD-10-CM | POA: Diagnosis not present

## 2021-09-08 DIAGNOSIS — G9389 Other specified disorders of brain: Secondary | ICD-10-CM | POA: Diagnosis not present

## 2021-09-08 DIAGNOSIS — Z79899 Other long term (current) drug therapy: Secondary | ICD-10-CM | POA: Diagnosis not present

## 2021-09-08 DIAGNOSIS — E782 Mixed hyperlipidemia: Secondary | ICD-10-CM | POA: Diagnosis not present

## 2021-09-08 DIAGNOSIS — Z9181 History of falling: Secondary | ICD-10-CM | POA: Diagnosis not present

## 2021-09-08 LAB — RAD ONC ARIA SESSION SUMMARY
Course Elapsed Days: 23
Plan Fractions Treated to Date: 8
Plan Prescribed Dose Per Fraction: 2 Gy
Plan Total Fractions Prescribed: 30
Plan Total Prescribed Dose: 60 Gy
Reference Point Dosage Given to Date: 16 Gy
Reference Point Session Dosage Given: 2 Gy
Session Number: 13

## 2021-09-08 MED ORDER — LANTUS SOLOSTAR 100 UNIT/ML ~~LOC~~ SOPN: 10.0000 [IU] | PEN_INJECTOR | Freq: Every day | SUBCUTANEOUS | 1 refills | Status: DC

## 2021-09-08 MED ORDER — INSULIN PEN NEEDLE 31G X 5 MM MISC: 1.0000 | Freq: Every day | 1 refills | Status: DC

## 2021-09-08 NOTE — Patient Instructions (Signed)
Give 10 units of basal insulin (Lantus) at bedtime each night. Continue to give sliding scale insulin with meals. Call the office on Friday morning to let us know what her blood sugars have been to see if her insulin needs to be titrated. Call the office for blood sugars of 400 or higher. Call for blood sugars less than 70 or consistently less than 100.

## 2021-09-08 NOTE — Progress Notes (Signed)
Established Patient Office Visit  Subjective   Patient ID: Jennifer Cowan, female    DOB: 1945/10/03  Age: 76 y.o. MRN: [id number]  Chief Complaint  Patient presents with   Hyperglycemia    HPI Jennifer Cowan is here with her sister and brother in law. Since her last visit she has been diagnosed with lung cancer with metastasis to her brain. She has started treatments for this. This week she has declined her chemo treatments due to fatigue. She has been an a dexamethasone taper as part of her treatment and will continue to taper off of this for the next week week. Her oncologist started her on sliding scale insulin for hyperglycemia, however they have not been able to control her blood sugars well. Her blood sugars are consistently over 200, occasionally up to 400. Her fasting blood sugar was 144 this morning.   She also has some skin breakdown on her bottom for the last 2-3 weeks. There are multiple small areas. No drainage, fever, or chills present. They have been repositioning her every 2 hours and using barrier creams. She sometimes has some incontience. She has been wears pads and they have been sure to change these as quickly a possible.   Past Medical History:  Diagnosis Date   Anemia    Arthritis    Breast cancer ([place])    Cancer ([place]) 1999   Breast Cancer   Hyperlipidemia    Hypertension    Osteopenia 01/12/2021    ROS As per HPI.    Objective:     BP (!) 104/52   Pulse (!) 108   Temp (!) 97.5 F (36.4 C) (Temporal)   Ht 5\' 8"  (1.727 m)   Wt 172 lb 2 oz (78.1 kg)   SpO2 97%   BMI 26.17 kg/m  BP Readings from Last 3 Encounters:  09/08/21 (!) 104/52  09/07/21 103/65  09/02/21 (!) 115/52      Physical Exam Physical Exam Vitals and nursing note reviewed.  Constitutional:      General: She is not in acute distress.    Appearance: She is not ill-appearing, toxic-appearing or diaphoretic.  Cardiovascular:     Rate and Rhythm: Normal rate and regular rhythm.     Heart  sounds: Normal heart sounds. No murmur heard. Pulmonary:     Effort: Pulmonary effort is normal. No respiratory distress.     Breath sounds: Normal breath sounds.  Abdominal:     General: Bowel sounds are normal. There is no distension.     Palpations: Abdomen is soft.     Tenderness: There is no abdominal tenderness. There is no guarding or rebound.  Skin:    General: Skin is warm and dry.     Findings: Wound present.     Comments: Multiple small stage 1 pressure ulcers to left buttock. No exudate or surrounding erythema.   Neurological:     Mental Status: She is alert and oriented to person, place, and time.  Psychiatric:        Mood and Affect: Affect is flat.        Behavior: Behavior is withdrawn.      No results found for any visits on 09/08/21.    The 10-year ASCVD risk score (Arnett DK, et al., 2019) is: 24.6%    Assessment & Plan:   Jennifer Cowan was seen today for hyperglycemia.  Diagnoses and all orders for this visit:  Type 2 diabetes mellitus with hyperglycemia, without long-term current use of insulin (  [place]) Uncontrolled hyperglycemia. Will add basal insulin as below. Continue sliding scale with meals. They will call the office in 2 days with blood sugar readings to see if further titration is needed. She will be on dexamethasone for another few weeks at least.  -     insulin glargine (LANTUS SOLOSTAR) 100 UNIT/ML Solostar Pen; Inject 10 Units into the skin at bedtime. -     Insulin Pen Needle 31G X 5 MM MISC; 1 each by Does not apply route daily.  Pressure injury of left buttock, stage 1 Duoderm given. Discussed barrier creams, repositioning.    Return in about 4 weeks (around 10/06/2021) for DM follow up. Sooner for new or worsening symptoms.   The patient indicates understanding of these issues and agrees with the plan.  [name], FNP

## 2021-09-09 ENCOUNTER — Telehealth: Payer: Self-pay | Admitting: Family Medicine

## 2021-09-09 ENCOUNTER — Encounter: Payer: Self-pay | Admitting: Radiation Oncology

## 2021-09-09 ENCOUNTER — Other Ambulatory Visit: Payer: Self-pay

## 2021-09-09 ENCOUNTER — Ambulatory Visit
Admission: RE | Admit: 2021-09-09 | Discharge: 2021-09-09 | Disposition: A | Discharge status: Home / Self Care | Payer: Medicare Other | Source: Ambulatory Visit | Attending: Radiation Oncology | Admitting: Radiation Oncology

## 2021-09-09 DIAGNOSIS — G9389 Other specified disorders of brain: Secondary | ICD-10-CM | POA: Diagnosis not present

## 2021-09-09 DIAGNOSIS — Z791 Long term (current) use of non-steroidal anti-inflammatories (NSAID): Secondary | ICD-10-CM | POA: Diagnosis not present

## 2021-09-09 DIAGNOSIS — Z853 Personal history of malignant neoplasm of breast: Secondary | ICD-10-CM | POA: Diagnosis not present

## 2021-09-09 DIAGNOSIS — M8580 Other specified disorders of bone density and structure, unspecified site: Secondary | ICD-10-CM | POA: Diagnosis not present

## 2021-09-09 DIAGNOSIS — Z9181 History of falling: Secondary | ICD-10-CM | POA: Diagnosis not present

## 2021-09-09 DIAGNOSIS — M1990 Unspecified osteoarthritis, unspecified site: Secondary | ICD-10-CM | POA: Diagnosis not present

## 2021-09-09 DIAGNOSIS — I10 Essential (primary) hypertension: Secondary | ICD-10-CM | POA: Diagnosis not present

## 2021-09-09 DIAGNOSIS — Z5111 Encounter for antineoplastic chemotherapy: Secondary | ICD-10-CM | POA: Diagnosis not present

## 2021-09-09 DIAGNOSIS — Z510 Encounter for antineoplastic radiation therapy: Secondary | ICD-10-CM | POA: Diagnosis not present

## 2021-09-09 DIAGNOSIS — Z171 Estrogen receptor negative status [ER-]: Secondary | ICD-10-CM | POA: Diagnosis not present

## 2021-09-09 DIAGNOSIS — C50211 Malignant neoplasm of upper-inner quadrant of right female breast: Secondary | ICD-10-CM | POA: Diagnosis not present

## 2021-09-09 DIAGNOSIS — Z9012 Acquired absence of left breast and nipple: Secondary | ICD-10-CM | POA: Diagnosis not present

## 2021-09-09 DIAGNOSIS — Z79899 Other long term (current) drug therapy: Secondary | ICD-10-CM | POA: Diagnosis not present

## 2021-09-09 DIAGNOSIS — C3412 Malignant neoplasm of upper lobe, left bronchus or lung: Secondary | ICD-10-CM | POA: Diagnosis not present

## 2021-09-09 DIAGNOSIS — D509 Iron deficiency anemia, unspecified: Secondary | ICD-10-CM | POA: Diagnosis not present

## 2021-09-09 DIAGNOSIS — E782 Mixed hyperlipidemia: Secondary | ICD-10-CM | POA: Diagnosis not present

## 2021-09-09 LAB — RAD ONC ARIA SESSION SUMMARY
Course Elapsed Days: 24
Plan Fractions Treated to Date: 9
Plan Prescribed Dose Per Fraction: 2 Gy
Plan Total Fractions Prescribed: 30
Plan Total Prescribed Dose: 60 Gy
Reference Point Dosage Given to Date: 18 Gy
Reference Point Session Dosage Given: 2 Gy
Session Number: 14

## 2021-09-09 NOTE — Progress Notes (Signed)
The patient was noted to complain of headaches and was just recently treated for brain disease with fractionated SRS. She has been tapering her Dexamethasone and for the last week has been on 1/2 tablet (total 2 mg) once daily. We discussed the rationale to increase to BID dosing and we will recheck how she's doing next week and consider a slower taper.

## 2021-09-09 NOTE — Telephone Encounter (Signed)
Hold benazepril for now. Continue to check BP. If BP is greater than 859 systolic, can restart and monitor BP. Monitor pulse, notify if remains >100. Notify for fever. Agree with increasing water intake, increased heart rate may also be due to dexamethasone. [facility] for nursing order 1x a week for 4 weeks.

## 2021-09-09 NOTE — Telephone Encounter (Signed)
Please advise 

## 2021-09-10 ENCOUNTER — Ambulatory Visit
Admission: RE | Admit: 2021-09-10 | Discharge: 2021-09-10 | Disposition: A | Discharge status: Home / Self Care | Payer: Medicare Other | Source: Ambulatory Visit | Attending: Radiation Oncology | Admitting: Radiation Oncology

## 2021-09-10 ENCOUNTER — Other Ambulatory Visit: Payer: Self-pay

## 2021-09-10 ENCOUNTER — Ambulatory Visit: Admission: RE | Admit: 2021-09-10 | Payer: Medicare Other | Source: Ambulatory Visit

## 2021-09-10 ENCOUNTER — Telehealth: Payer: Self-pay | Admitting: Family Medicine

## 2021-09-10 ENCOUNTER — Encounter (HOSPITAL_COMMUNITY): Payer: Self-pay

## 2021-09-10 MED FILL — Dexamethasone Sodium Phosphate Inj 100 MG/10ML: INTRAMUSCULAR | Qty: 1 | Status: AC

## 2021-09-10 NOTE — Telephone Encounter (Signed)
Continue lantus at current dosage. If fasting blood sugars are consistently >140, let me know.

## 2021-09-10 NOTE — Telephone Encounter (Signed)
Calling to report that the medicine is working and her blood sugar this morning was 115.

## 2021-09-13 ENCOUNTER — Other Ambulatory Visit: Payer: Self-pay

## 2021-09-13 ENCOUNTER — Inpatient Hospital Stay: Payer: Medicare Other

## 2021-09-13 ENCOUNTER — Ambulatory Visit
Admission: RE | Admit: 2021-09-13 | Discharge: 2021-09-13 | Disposition: A | Discharge status: Home / Self Care | Payer: Medicare Other | Source: Ambulatory Visit | Attending: Radiation Oncology | Admitting: Radiation Oncology

## 2021-09-13 ENCOUNTER — Inpatient Hospital Stay (HOSPITAL_BASED_OUTPATIENT_CLINIC_OR_DEPARTMENT_OTHER): Payer: Medicare Other | Admitting: Hematology and Oncology

## 2021-09-13 ENCOUNTER — Encounter: Payer: Self-pay | Admitting: Hematology and Oncology

## 2021-09-13 VITALS — HR 98

## 2021-09-13 DIAGNOSIS — C3490 Malignant neoplasm of unspecified part of unspecified bronchus or lung: Secondary | ICD-10-CM | POA: Diagnosis not present

## 2021-09-13 DIAGNOSIS — C50919 Malignant neoplasm of unspecified site of unspecified female breast: Secondary | ICD-10-CM | POA: Diagnosis not present

## 2021-09-13 DIAGNOSIS — Z95828 Presence of other vascular implants and grafts: Secondary | ICD-10-CM

## 2021-09-13 DIAGNOSIS — C50211 Malignant neoplasm of upper-inner quadrant of right female breast: Secondary | ICD-10-CM | POA: Diagnosis not present

## 2021-09-13 DIAGNOSIS — R739 Hyperglycemia, unspecified: Secondary | ICD-10-CM

## 2021-09-13 DIAGNOSIS — Z171 Estrogen receptor negative status [ER-]: Secondary | ICD-10-CM | POA: Diagnosis not present

## 2021-09-13 DIAGNOSIS — Z79899 Other long term (current) drug therapy: Secondary | ICD-10-CM | POA: Diagnosis not present

## 2021-09-13 DIAGNOSIS — Z510 Encounter for antineoplastic radiation therapy: Secondary | ICD-10-CM | POA: Diagnosis not present

## 2021-09-13 DIAGNOSIS — C3412 Malignant neoplasm of upper lobe, left bronchus or lung: Secondary | ICD-10-CM | POA: Diagnosis not present

## 2021-09-13 DIAGNOSIS — Z5111 Encounter for antineoplastic chemotherapy: Secondary | ICD-10-CM | POA: Diagnosis not present

## 2021-09-13 LAB — RAD ONC ARIA SESSION SUMMARY
Course Elapsed Days: 28
Plan Fractions Treated to Date: 10
Plan Prescribed Dose Per Fraction: 2 Gy
Plan Total Fractions Prescribed: 30
Plan Total Prescribed Dose: 60 Gy
Reference Point Dosage Given to Date: 20 Gy
Reference Point Session Dosage Given: 2 Gy
Session Number: 15

## 2021-09-13 LAB — CMP (CANCER CENTER ONLY)
ALT: 29 U/L (ref 0–44)
AST: 15 U/L (ref 15–41)
Albumin: 3.4 g/dL — ABNORMAL LOW (ref 3.5–5.0)
Alkaline Phosphatase: 60 U/L (ref 38–126)
Anion gap: 6 (ref 5–15)
BUN: 18 mg/dL (ref 8–23)
CO2: 27 mmol/L (ref 22–32)
Calcium: 9.4 mg/dL (ref 8.9–10.3)
Chloride: 105 mmol/L (ref 98–111)
Creatinine: 0.42 mg/dL — ABNORMAL LOW (ref 0.44–1.00)
GFR, Estimated: >60 mL/min (ref >60)
Glucose, Bld: 220 mg/dL — ABNORMAL HIGH (ref 70–99)
Potassium: 3.8 mmol/L (ref 3.5–5.1)
Sodium: 138 mmol/L (ref 135–145)
Total Bilirubin: 0.4 mg/dL (ref 0.3–1.2)
Total Protein: 5.9 g/dL — ABNORMAL LOW (ref 6.5–8.1)

## 2021-09-13 LAB — CBC WITH DIFFERENTIAL (CANCER CENTER ONLY)
Abs Immature Granulocytes: 0.1 10*3/uL — ABNORMAL HIGH (ref 0.00–0.07)
Basophils Absolute: 0 10*3/uL (ref 0.0–0.1)
Basophils Relative: 0 %
Eosinophils Absolute: 0 10*3/uL (ref 0.0–0.5)
Eosinophils Relative: 0 %
HCT: 37.3 % (ref 36.0–46.0)
Hemoglobin: 12.7 g/dL (ref 12.0–15.0)
Immature Granulocytes: 2 %
Lymphocytes Relative: 8 %
Lymphs Abs: 0.4 10*3/uL — ABNORMAL LOW (ref 0.7–4.0)
MCH: 29.9 pg (ref 26.0–34.0)
MCHC: 34 g/dL (ref 30.0–36.0)
MCV: 87.8 fL (ref 80.0–100.0)
Monocytes Absolute: 0.5 10*3/uL (ref 0.1–1.0)
Monocytes Relative: 9 %
Neutro Abs: 4.4 10*3/uL (ref 1.7–7.7)
Neutrophils Relative %: 81 %
Platelet Count: 266 10*3/uL (ref 150–400)
RBC: 4.25 MIL/uL (ref 3.87–5.11)
RDW: 14.8 % (ref 11.5–15.5)
WBC Count: 5.5 10*3/uL (ref 4.0–10.5)
nRBC: 0 % (ref 0.0–0.2)

## 2021-09-13 MED ORDER — SODIUM CHLORIDE 0.9 % IV SOLN
45.0000 mg/m2 | Freq: Once | INTRAVENOUS | Status: AC
  Administered 2021-09-13: 90 mg via INTRAVENOUS
  Filled 2021-09-13: qty 15

## 2021-09-13 MED ORDER — SODIUM CHLORIDE 0.9% FLUSH
10.0000 mL | INTRAVENOUS | Status: DC | PRN
  Administered 2021-09-13: 10 mL

## 2021-09-13 MED ORDER — HEPARIN SOD (PORK) LOCK FLUSH 100 UNIT/ML IV SOLN
500.0000 [IU] | Freq: Once | INTRAVENOUS | Status: AC | PRN
  Administered 2021-09-13: 500 [IU]

## 2021-09-13 MED ORDER — SODIUM CHLORIDE 0.9 % IV SOLN
179.0000 mg | Freq: Once | INTRAVENOUS | Status: AC
  Administered 2021-09-13: 180 mg via INTRAVENOUS
  Filled 2021-09-13: qty 18

## 2021-09-13 MED ORDER — PALONOSETRON HCL INJECTION 0.25 MG/5ML
0.2500 mg | Freq: Once | INTRAVENOUS | Status: AC
  Administered 2021-09-13: 0.25 mg via INTRAVENOUS
  Filled 2021-09-13: qty 5

## 2021-09-13 MED ORDER — SODIUM CHLORIDE 0.9 % IV SOLN: Freq: Once | INTRAVENOUS | Status: AC

## 2021-09-13 MED ORDER — SODIUM CHLORIDE 0.9 % IV SOLN
10.0000 mg | Freq: Once | INTRAVENOUS | Status: AC
  Administered 2021-09-13: 10 mg via INTRAVENOUS
  Filled 2021-09-13: qty 10

## 2021-09-13 MED ORDER — CETIRIZINE HCL 10 MG/ML IV SOLN
10.0000 mg | Freq: Once | INTRAVENOUS | Status: AC
  Administered 2021-09-13: 10 mg via INTRAVENOUS
  Filled 2021-09-13: qty 1

## 2021-09-13 MED ORDER — FAMOTIDINE IN NACL 20-0.9 MG/50ML-% IV SOLN
20.0000 mg | Freq: Once | INTRAVENOUS | Status: AC
  Administered 2021-09-13: 20 mg via INTRAVENOUS
  Filled 2021-09-13: qty 50

## 2021-09-13 MED ORDER — SODIUM CHLORIDE 0.9% FLUSH
10.0000 mL | Freq: Once | INTRAVENOUS | Status: AC
  Administered 2021-09-13: 10 mL

## 2021-09-13 NOTE — Progress Notes (Signed)
[facility] Follow up:    Jennifer Cowan, [place] [address]  DIAGNOSIS: lung cancer  SUMMARY OF ONCOLOGIC HISTORY: Oncology History  Malignant neoplasm of unspecified part of unspecified bronchus or lung ([place])  08/13/2021 Initial Diagnosis   Malignant neoplasm of unspecified part of unspecified bronchus or lung ([place])   08/30/2021 -  Chemotherapy   Patient is on Treatment Plan : LUNG Carboplatin / Paclitaxel + XRT q7d       CURRENT THERAPY: chemoradiation  INTERVAL HISTORY:  Jennifer Cowan 76 y.o. female returns for f/u. Jennifer Cowan is here with her sister and her niece.   She says she is tired today.  She otherwise denies any major side effects from chemotherapy.  She is willing to give it another try.  She continued on radiation except for Friday when she did not feel like it.  She is now on Lantus for hyperglycemia and her blood sugar today is much better controlled.  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  MEDICAL HISTORY: Past Medical History:  Diagnosis Date   Anemia    Arthritis    Breast cancer ([place])    Cancer ([place]) 1999   Breast Cancer   Hyperlipidemia    Hypertension    Osteopenia 01/12/2021     SURGICAL HISTORY: Past Surgical History:  Procedure Laterality Date   BREAST SURGERY  1999   Masectomy- left   BRONCHIAL BIOPSY  07/30/2021   Procedure: BRONCHIAL BIOPSIES;  Surgeon: Collene Gobble, MD;  Location[facility] ENDOSCOPY;  Service: Pulmonary;;   BRONCHIAL BRUSHINGS  07/30/2021   Procedure: BRONCHIAL BRUSHINGS;  Surgeon: Collene Gobble, MD;  Location: [facility] ENDOSCOPY;  Service: Pulmonary;;   BRONCHIAL NEEDLE ASPIRATION BIOPSY  07/30/2021   Procedure: BRONCHIAL NEEDLE ASPIRATION BIOPSIES;  Surgeon: Collene Gobble, MD;  Location: [facility] ENDOSCOPY;  Service: Pulmonary;;   CYSTECTOMY     cyst removed off top of head   IR IMAGING GUIDED PORT INSERTION  08/20/2021   MASTECTOMY     VIDEO BRONCHOSCOPY WITH RADIAL ENDOBRONCHIAL ULTRASOUND  07/30/2021   Procedure: VIDEO BRONCHOSCOPY WITH RADIAL ENDOBRONCHIAL ULTRASOUND;  Surgeon: Collene Gobble, MD;  Location: [facility] ENDOSCOPY;  Service: Pulmonary;;    SOCIAL HISTORY: Social History   Socioeconomic History   Marital status: Single    Spouse name: Not on file   Number of children: 0   Years of education: 12   Highest education level: High school graduate  Occupational History   Occupation: retired  Tobacco Use   Smoking status: Never   Smokeless tobacco: Never  Vaping Use   Vaping Use: Never used  Substance and Sexual Activity   Alcohol use: No   Drug use: Never   Sexual activity: Yes  Birth control/protection: Post-menopausal  Other Topics Concern   Not on file  Social History Narrative   Not on file   Social Determinants of Health   Financial Resource Strain: Not on file  Food Insecurity: Not on file  Transportation Needs: Not on file  Physical Activity: Not on file  Stress: Not on file  Social Connections: Not on file  Intimate Partner Violence: Not on file    FAMILY HISTORY: Family History  Problem Relation Age of Onset   Arthritis Mother    Breast cancer Sister    Cancer Sister        Breast Cancer   Diabetes  Sister    Hyperlipidemia Sister    Hypertension Sister    Stroke Sister    Diabetes Brother    Hypertension Brother     Review of Systems  Constitutional:  Positive for fatigue. Negative for appetite change, chills, fever and unexpected weight change.  HENT:   Negative for hearing loss, lump/mass and trouble swallowing.   Eyes:  Negative for eye problems and icterus.  Respiratory:  Negative for chest tightness, cough and shortness of breath.   Cardiovascular:  Negative for chest pain, leg swelling and palpitations.  Gastrointestinal:  Negative for abdominal distention, abdominal pain, constipation, diarrhea, nausea and vomiting.  Endocrine: Negative for hot flashes.  Genitourinary:  Negative for difficulty urinating.   Musculoskeletal:  Negative for arthralgias.  Skin:  Negative for itching and rash.  Neurological:  Negative for dizziness, extremity weakness, headaches and numbness.  Hematological:  Negative for adenopathy. Does not bruise/bleed easily.  Psychiatric/Behavioral:  Negative for depression. The patient is not nervous/anxious.       PHYSICAL EXAMINATION  ECOG PERFORMANCE STATUS: 2 - Symptomatic, <50% confined to bed  Vitals:   09/13/21 1401  BP: 126/61  Pulse: (!) 106  Resp: 18  Temp: (!) 97.5 F (36.4 C)  SpO2: 98%    Physical Exam Constitutional:      General: She is not in acute distress.    Appearance: Normal appearance. She is not toxic-appearing.     Comments: Appears tired and flat affect  HENT:     Head: Normocephalic and atraumatic.  Eyes:     General: No scleral icterus. Cardiovascular:     Rate and Rhythm: Normal rate and regular rhythm.     Pulses: Normal pulses.     Heart sounds: Normal heart sounds.  Pulmonary:     Effort: Pulmonary effort is normal.     Breath sounds: Normal breath sounds.  Chest:     Comments: Breast exam deferred today Abdominal:     General: Abdomen is flat. Bowel sounds are normal. There is no distension.      Palpations: Abdomen is soft.     Tenderness: There is no abdominal tenderness.  Musculoskeletal:        General: No swelling.     Cervical back: Neck supple.  Lymphadenopathy:     Cervical: No cervical adenopathy.  Skin:    General: Skin is warm and dry.     Findings: No rash.  Neurological:     Mental Status: She is alert and oriented to person, place, and time.  Psychiatric:        Mood and Affect: Mood normal.        Behavior: Behavior normal.     LABORATORY DATA:  CBC    Component Value Date/Time   WBC 5.5 09/13/2021 1340   WBC 6.7 07/25/2021 0111   RBC 4.25  09/13/2021 1340   HGB 12.7 09/13/2021 1340   HGB 15.7 07/07/2021 1538   HCT 37.3 09/13/2021 1340   HCT 46.6 07/07/2021 1538   PLT 266 09/13/2021 1340   PLT 450 07/07/2021 1538   MCV 87.8 09/13/2021 1340   MCV 88 07/07/2021 1538   MCH 29.9 09/13/2021 1340   MCHC 34.0 09/13/2021 1340   RDW 14.8 09/13/2021 1340   RDW 12.9 07/07/2021 1538   LYMPHSABS 0.4 (L) 09/13/2021 1340   LYMPHSABS 1.6 07/07/2021 1538   MONOABS 0.5 09/13/2021 1340   EOSABS 0.0 09/13/2021 1340   EOSABS 0.1 07/07/2021 1538   BASOSABS 0.0 09/13/2021 1340   BASOSABS 0.0 07/07/2021 1538    CMP     Component Value Date/Time   NA 138 09/13/2021 1340   NA 141 07/07/2021 1538   K 3.8 09/13/2021 1340   CL 105 09/13/2021 1340   CO2 27 09/13/2021 1340   GLUCOSE 220 (H) 09/13/2021 1340   BUN 18 09/13/2021 1340   BUN 9 07/07/2021 1538   CREATININE 0.42 (L) 09/13/2021 1340   CALCIUM 9.4 09/13/2021 1340   PROT 5.9 (L) 09/13/2021 1340   PROT 7.9 07/07/2021 1538   ALBUMIN 3.4 (L) 09/13/2021 1340   ALBUMIN 4.6 07/07/2021 1538   AST 15 09/13/2021 1340   ALT 29 09/13/2021 1340   ALKPHOS 60 09/13/2021 1340   BILITOT 0.4 09/13/2021 1340   GFRNONAA >60 09/13/2021 1340   GFRAA 103 03/05/2020 1119     ASSESSMENT and THERAPY PLAN:   Malignant neoplasm of unspecified part of unspecified bronchus or lung ([place]) This is a 76 year old patient with  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  She also appears to have had some regional adenopathy including prevascular, AP window and left hilar adenopathy.  She has multiple intracranial metastatic lesions which are likely secondary to metastatic lung cancer given presence of regional adenopathy.  She is now status post SRS to the brain lesions.  She is started concurrent chemoradiation with CarboTaxol on August 30, 2021.  She refused to chemotherapy last week since she was feeling very tired.  She is here for follow-up and she feels ready to try it 1 more time.  She did not have any adverse effects from chemotherapy but she is very worried about having to take chemotherapy.  We have discussed about considering continuing chemotherapy and radiation which may give her the best possible outcome as long as she is tolerating it well.  Okay to proceed with chemotherapy as planned today labs are within parameters.  She will return to clinic before her next planned weekly chemotherapy.  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  There is no evidence of axillary lymphadenopathy on imaging. We have discussed her in the breast [place].  It appears that she may have locally advanced breast cancer without any involvement of axillary lymph nodes or other metastatic disease related to breast cancer.  Since CarboTaxol is also active in breast cancer, I will continue to monitor her breast mass while she is on CarboTaxol concurrent with radiation for her non-small cell lung cancer. Foundation 1 testing on the breast specimen with microsatellite status, stable, TMB 4 mutations per MB, some other genomic findings but no  therapeutic implications.  PD-L1  showed a CPS score of 10 from the breast specimen Breast exam deferred today  Hyperglycemia  Hyperglycemia secondary to dexamethasone use she is currently on dexamethasone half milligram every other day, blood glucose today is 220.  She saw her PCP since her last visit and is now on long-acting Lantus.    Total encounter time:30 minutes*in face-to-face visit time, chart review, lab review, care coordination, order entry, and documentation of the encounter time.  *Total Encounter Time as defined by the Centers for Medicare and Medicaid Services includes, in addition to the face-to-face time of a patient visit (documented in the note above) non-face-to-face time: obtaining and reviewing outside history, ordering and reviewing medications, tests or procedures, care coordination (communications with other health care professionals or caregivers) and documentation in the medical record.

## 2021-09-13 NOTE — Progress Notes (Signed)
I spoke with the patient's niece and she stated Jennifer Cowan had a good weekend.  She has been doing well, no complaints of anything.  She reported they did not increase her steroids on Friday, they are following the taper instructions.  She is currently on 2 mg every other day, no issues or concerns.  She said she thinks Doyce was just having a rough couple of days last week and she said Henli wants to keep fighting.  We will continue with radiation therapy as scheduled, she was informed to reach out with any concerns or questions.  She verbalized understanding.  Gloriajean Dell. Leonie Green, BSN

## 2021-09-13 NOTE — Assessment & Plan Note (Signed)
  Hyperglycemia secondary to dexamethasone use she is currently on dexamethasone half milligram every other day, blood glucose today is 220.  She saw her PCP since her last visit and is now on long-acting Lantus.

## 2021-09-13 NOTE — Assessment & Plan Note (Signed)
Mammogram done Jun 15, 2021 with concern for right breast mass warranting further investigation.  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  There is no evidence of axillary lymphadenopathy on imaging. We have discussed her in the breast [place].  It appears that she may have locally advanced breast cancer without any involvement of axillary lymph nodes or other metastatic disease related to breast cancer.  Since CarboTaxol is also active in breast cancer, I will continue to monitor her breast mass while she is on CarboTaxol concurrent with radiation for her non-small cell lung cancer. Foundation 1 testing on the breast specimen with microsatellite status, stable, TMB 4 mutations per MB, some other genomic findings but no therapeutic implications.  PD-L1 showed a CPS score of 10 from the breast specimen Breast exam deferred today

## 2021-09-13 NOTE — Patient Instructions (Signed)
[facility] ONCOLOGY  Discharge Instructions: Thank you for choosing [facility] to provide your oncology and hematology care.   If you have a lab appointment with the [facility], please go directly to the [facility] and check in at the registration area.   Wear comfortable clothing and clothing appropriate for easy access to any Portacath or PICC line.   We strive to give you quality time with your provider. You may need to reschedule your appointment if you arrive late (15 or more minutes).  Arriving late affects you and other patients whose appointments are after yours.  Also, if you miss three or more appointments without notifying the office, you may be dismissed from the clinic at the provider's discretion.      For prescription refill requests, have your pharmacy contact our office and allow 72 hours for refills to be completed.    Today you received the following chemotherapy and/or immunotherapy agents: Paclitaxel and Carboplatin      To help prevent nausea and vomiting after your treatment, we encourage you to take your nausea medication as directed.  BELOW ARE SYMPTOMS THAT SHOULD BE REPORTED IMMEDIATELY: *FEVER GREATER THAN 100.4 F (38 C) OR HIGHER *CHILLS OR SWEATING *NAUSEA AND VOMITING THAT IS NOT CONTROLLED WITH YOUR NAUSEA MEDICATION *UNUSUAL SHORTNESS OF BREATH *UNUSUAL BRUISING OR BLEEDING *URINARY PROBLEMS (pain or burning when urinating, or frequent urination) *BOWEL PROBLEMS (unusual diarrhea, constipation, pain near the anus) TENDERNESS IN MOUTH AND THROAT WITH OR WITHOUT PRESENCE OF ULCERS (sore throat, sores in mouth, or a toothache) UNUSUAL RASH, SWELLING OR PAIN  UNUSUAL VAGINAL DISCHARGE OR ITCHING   Items with * indicate a potential emergency and should be followed up as soon as possible or go to the Emergency Department if any problems should occur.  Please show the CHEMOTHERAPY ALERT CARD or IMMUNOTHERAPY ALERT  CARD at check-in to the Emergency Department and triage nurse.  Should you have questions after your visit or need to cancel or reschedule your appointment, please contact [facility]  Dept: [phone]  and follow the prompts.  Office hours are 8:00 a.m. to 4:30 p.m. Monday - Friday. Please note that voicemails left after 4:00 p.m. may not be returned until the following business day.  We are closed weekends and major holidays. You have access to a nurse at all times for urgent questions. Please call the main number to the clinic Dept: [phone] and follow the prompts.   For any non-urgent questions, you may also contact your provider using MyChart. We now offer e-Visits for anyone 43 and older to request care online for non-urgent symptoms. For details visit [email or web address].   Also download the MyChart app! Go to the app store, search "MyChart", open the app, select [facility], and log in with your MyChart username and password.  Masks are optional in the cancer centers. If you would like for your care team to wear a mask while they are taking care of you, please let them know. You may have one support person who is at least 76 years old accompany you for your appointments.

## 2021-09-13 NOTE — Assessment & Plan Note (Signed)
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  She also appears to have had some regional adenopathy including prevascular, AP window and left hilar adenopathy.  She has multiple intracranial metastatic lesions which are likely secondary to metastatic lung cancer given presence of regional adenopathy.  She is now status post SRS to the brain lesions.  She is started concurrent chemoradiation with CarboTaxol on August 30, 2021.  She refused to chemotherapy last week since she was feeling very tired.  She is here for follow-up and she feels ready to try it 1 more time.  She did not have any adverse effects from chemotherapy but she is very worried about having to take chemotherapy.  We have discussed about considering continuing chemotherapy and radiation which may give her the best possible outcome as long as she is tolerating it well.  Okay to proceed with chemotherapy as planned today labs are within parameters.  She will return to clinic before her next planned weekly chemotherapy.

## 2021-09-14 ENCOUNTER — Ambulatory Visit
Admission: RE | Admit: 2021-09-14 | Discharge: 2021-09-14 | Disposition: A | Discharge status: Home / Self Care | Payer: Medicare Other | Source: Ambulatory Visit | Attending: Radiation Oncology | Admitting: Radiation Oncology

## 2021-09-14 ENCOUNTER — Other Ambulatory Visit: Payer: Self-pay

## 2021-09-14 DIAGNOSIS — I10 Essential (primary) hypertension: Secondary | ICD-10-CM | POA: Diagnosis not present

## 2021-09-14 DIAGNOSIS — D509 Iron deficiency anemia, unspecified: Secondary | ICD-10-CM | POA: Diagnosis not present

## 2021-09-14 DIAGNOSIS — Z791 Long term (current) use of non-steroidal anti-inflammatories (NSAID): Secondary | ICD-10-CM | POA: Diagnosis not present

## 2021-09-14 DIAGNOSIS — Z853 Personal history of malignant neoplasm of breast: Secondary | ICD-10-CM | POA: Diagnosis not present

## 2021-09-14 DIAGNOSIS — Z510 Encounter for antineoplastic radiation therapy: Secondary | ICD-10-CM | POA: Diagnosis not present

## 2021-09-14 DIAGNOSIS — C3492 Malignant neoplasm of unspecified part of left bronchus or lung: Secondary | ICD-10-CM | POA: Diagnosis not present

## 2021-09-14 DIAGNOSIS — G9389 Other specified disorders of brain: Secondary | ICD-10-CM | POA: Diagnosis not present

## 2021-09-14 DIAGNOSIS — C3412 Malignant neoplasm of upper lobe, left bronchus or lung: Secondary | ICD-10-CM | POA: Diagnosis not present

## 2021-09-14 DIAGNOSIS — Z5111 Encounter for antineoplastic chemotherapy: Secondary | ICD-10-CM | POA: Diagnosis not present

## 2021-09-14 DIAGNOSIS — M8580 Other specified disorders of bone density and structure, unspecified site: Secondary | ICD-10-CM | POA: Diagnosis not present

## 2021-09-14 DIAGNOSIS — C50211 Malignant neoplasm of upper-inner quadrant of right female breast: Secondary | ICD-10-CM | POA: Diagnosis not present

## 2021-09-14 DIAGNOSIS — Z9012 Acquired absence of left breast and nipple: Secondary | ICD-10-CM | POA: Diagnosis not present

## 2021-09-14 DIAGNOSIS — M1990 Unspecified osteoarthritis, unspecified site: Secondary | ICD-10-CM | POA: Diagnosis not present

## 2021-09-14 DIAGNOSIS — Z9181 History of falling: Secondary | ICD-10-CM | POA: Diagnosis not present

## 2021-09-14 DIAGNOSIS — Z171 Estrogen receptor negative status [ER-]: Secondary | ICD-10-CM | POA: Diagnosis not present

## 2021-09-14 DIAGNOSIS — Z79899 Other long term (current) drug therapy: Secondary | ICD-10-CM | POA: Diagnosis not present

## 2021-09-14 DIAGNOSIS — E782 Mixed hyperlipidemia: Secondary | ICD-10-CM | POA: Diagnosis not present

## 2021-09-14 DIAGNOSIS — C3490 Malignant neoplasm of unspecified part of unspecified bronchus or lung: Secondary | ICD-10-CM | POA: Diagnosis not present

## 2021-09-14 LAB — RAD ONC ARIA SESSION SUMMARY
Course Elapsed Days: 29
Plan Fractions Treated to Date: 11
Plan Prescribed Dose Per Fraction: 2 Gy
Plan Total Fractions Prescribed: 30
Plan Total Prescribed Dose: 60 Gy
Reference Point Dosage Given to Date: 22 Gy
Reference Point Session Dosage Given: 2 Gy
Session Number: 16

## 2021-09-15 ENCOUNTER — Ambulatory Visit
Admission: RE | Admit: 2021-09-15 | Discharge: 2021-09-15 | Disposition: A | Discharge status: Home / Self Care | Payer: Medicare Other | Source: Ambulatory Visit | Attending: Radiation Oncology | Admitting: Radiation Oncology

## 2021-09-15 ENCOUNTER — Other Ambulatory Visit: Payer: Self-pay

## 2021-09-15 ENCOUNTER — Encounter (HOSPITAL_COMMUNITY): Payer: Self-pay | Admitting: Hematology and Oncology

## 2021-09-15 DIAGNOSIS — Z171 Estrogen receptor negative status [ER-]: Secondary | ICD-10-CM | POA: Diagnosis not present

## 2021-09-15 DIAGNOSIS — C3412 Malignant neoplasm of upper lobe, left bronchus or lung: Secondary | ICD-10-CM | POA: Diagnosis not present

## 2021-09-15 DIAGNOSIS — C50211 Malignant neoplasm of upper-inner quadrant of right female breast: Secondary | ICD-10-CM | POA: Diagnosis not present

## 2021-09-15 DIAGNOSIS — Z79899 Other long term (current) drug therapy: Secondary | ICD-10-CM | POA: Diagnosis not present

## 2021-09-15 DIAGNOSIS — Z5111 Encounter for antineoplastic chemotherapy: Secondary | ICD-10-CM | POA: Diagnosis not present

## 2021-09-15 DIAGNOSIS — Z510 Encounter for antineoplastic radiation therapy: Secondary | ICD-10-CM | POA: Diagnosis not present

## 2021-09-15 LAB — RAD ONC ARIA SESSION SUMMARY
Course Elapsed Days: 30
Plan Fractions Treated to Date: 12
Plan Prescribed Dose Per Fraction: 2 Gy
Plan Total Fractions Prescribed: 30
Plan Total Prescribed Dose: 60 Gy
Reference Point Dosage Given to Date: 24 Gy
Reference Point Session Dosage Given: 2 Gy
Session Number: 17

## 2021-09-16 ENCOUNTER — Other Ambulatory Visit: Payer: Self-pay | Admitting: Radiation Therapy

## 2021-09-16 ENCOUNTER — Ambulatory Visit
Admission: RE | Admit: 2021-09-16 | Discharge: 2021-09-16 | Disposition: A | Discharge status: Home / Self Care | Payer: Medicare Other | Source: Ambulatory Visit | Attending: Radiation Oncology | Admitting: Radiation Oncology

## 2021-09-16 ENCOUNTER — Other Ambulatory Visit: Payer: Self-pay

## 2021-09-16 DIAGNOSIS — C7931 Secondary malignant neoplasm of brain: Secondary | ICD-10-CM

## 2021-09-16 DIAGNOSIS — Z79899 Other long term (current) drug therapy: Secondary | ICD-10-CM | POA: Diagnosis not present

## 2021-09-16 DIAGNOSIS — Z510 Encounter for antineoplastic radiation therapy: Secondary | ICD-10-CM | POA: Diagnosis not present

## 2021-09-16 DIAGNOSIS — C3412 Malignant neoplasm of upper lobe, left bronchus or lung: Secondary | ICD-10-CM | POA: Diagnosis not present

## 2021-09-16 DIAGNOSIS — Z171 Estrogen receptor negative status [ER-]: Secondary | ICD-10-CM | POA: Diagnosis not present

## 2021-09-16 DIAGNOSIS — Z5111 Encounter for antineoplastic chemotherapy: Secondary | ICD-10-CM | POA: Diagnosis not present

## 2021-09-16 DIAGNOSIS — C50211 Malignant neoplasm of upper-inner quadrant of right female breast: Secondary | ICD-10-CM | POA: Diagnosis not present

## 2021-09-16 LAB — RAD ONC ARIA SESSION SUMMARY
Course Elapsed Days: 31
Plan Fractions Treated to Date: 13
Plan Prescribed Dose Per Fraction: 2 Gy
Plan Total Fractions Prescribed: 30
Plan Total Prescribed Dose: 60 Gy
Reference Point Dosage Given to Date: 26 Gy
Reference Point Session Dosage Given: 2 Gy
Session Number: 18

## 2021-09-17 ENCOUNTER — Ambulatory Visit
Admission: RE | Admit: 2021-09-17 | Discharge: 2021-09-17 | Disposition: A | Discharge status: Home / Self Care | Payer: Medicare Other | Source: Ambulatory Visit | Attending: Radiation Oncology | Admitting: Radiation Oncology

## 2021-09-17 ENCOUNTER — Other Ambulatory Visit: Payer: Self-pay

## 2021-09-17 DIAGNOSIS — C50211 Malignant neoplasm of upper-inner quadrant of right female breast: Secondary | ICD-10-CM | POA: Diagnosis not present

## 2021-09-17 DIAGNOSIS — Z853 Personal history of malignant neoplasm of breast: Secondary | ICD-10-CM | POA: Diagnosis not present

## 2021-09-17 DIAGNOSIS — M1990 Unspecified osteoarthritis, unspecified site: Secondary | ICD-10-CM | POA: Diagnosis not present

## 2021-09-17 DIAGNOSIS — Z9012 Acquired absence of left breast and nipple: Secondary | ICD-10-CM | POA: Diagnosis not present

## 2021-09-17 DIAGNOSIS — Z791 Long term (current) use of non-steroidal anti-inflammatories (NSAID): Secondary | ICD-10-CM | POA: Diagnosis not present

## 2021-09-17 DIAGNOSIS — Z510 Encounter for antineoplastic radiation therapy: Secondary | ICD-10-CM | POA: Insufficient documentation

## 2021-09-17 DIAGNOSIS — Z5111 Encounter for antineoplastic chemotherapy: Secondary | ICD-10-CM | POA: Insufficient documentation

## 2021-09-17 DIAGNOSIS — Z171 Estrogen receptor negative status [ER-]: Secondary | ICD-10-CM | POA: Insufficient documentation

## 2021-09-17 DIAGNOSIS — D509 Iron deficiency anemia, unspecified: Secondary | ICD-10-CM | POA: Diagnosis not present

## 2021-09-17 DIAGNOSIS — Z79899 Other long term (current) drug therapy: Secondary | ICD-10-CM | POA: Diagnosis not present

## 2021-09-17 DIAGNOSIS — Z9181 History of falling: Secondary | ICD-10-CM | POA: Diagnosis not present

## 2021-09-17 DIAGNOSIS — C3412 Malignant neoplasm of upper lobe, left bronchus or lung: Secondary | ICD-10-CM | POA: Insufficient documentation

## 2021-09-17 DIAGNOSIS — G9389 Other specified disorders of brain: Secondary | ICD-10-CM | POA: Diagnosis not present

## 2021-09-17 DIAGNOSIS — I10 Essential (primary) hypertension: Secondary | ICD-10-CM | POA: Diagnosis not present

## 2021-09-17 DIAGNOSIS — M8580 Other specified disorders of bone density and structure, unspecified site: Secondary | ICD-10-CM | POA: Diagnosis not present

## 2021-09-17 DIAGNOSIS — E782 Mixed hyperlipidemia: Secondary | ICD-10-CM | POA: Diagnosis not present

## 2021-09-17 DIAGNOSIS — C7931 Secondary malignant neoplasm of brain: Secondary | ICD-10-CM | POA: Insufficient documentation

## 2021-09-17 LAB — RAD ONC ARIA SESSION SUMMARY
Course Elapsed Days: 32
Plan Fractions Treated to Date: 14
Plan Prescribed Dose Per Fraction: 2 Gy
Plan Total Fractions Prescribed: 30
Plan Total Prescribed Dose: 60 Gy
Reference Point Dosage Given to Date: 28 Gy
Reference Point Session Dosage Given: 2 Gy
Session Number: 19

## 2021-09-20 ENCOUNTER — Other Ambulatory Visit: Payer: Self-pay | Admitting: Hematology and Oncology

## 2021-09-20 DIAGNOSIS — C3490 Malignant neoplasm of unspecified part of unspecified bronchus or lung: Secondary | ICD-10-CM

## 2021-09-21 ENCOUNTER — Other Ambulatory Visit (HOSPITAL_COMMUNITY): Payer: Self-pay

## 2021-09-21 ENCOUNTER — Inpatient Hospital Stay: Payer: Medicare Other

## 2021-09-21 ENCOUNTER — Other Ambulatory Visit: Payer: Self-pay

## 2021-09-21 ENCOUNTER — Inpatient Hospital Stay (HOSPITAL_BASED_OUTPATIENT_CLINIC_OR_DEPARTMENT_OTHER): Payer: Medicare Other | Admitting: Nurse Practitioner

## 2021-09-21 ENCOUNTER — Inpatient Hospital Stay: Payer: Medicare Other | Attending: Hematology and Oncology | Admitting: Hematology and Oncology

## 2021-09-21 ENCOUNTER — Ambulatory Visit
Admission: RE | Admit: 2021-09-21 | Discharge: 2021-09-21 | Disposition: A | Discharge status: Home / Self Care | Payer: Medicare Other | Source: Ambulatory Visit | Attending: Radiation Oncology | Admitting: Radiation Oncology

## 2021-09-21 ENCOUNTER — Encounter: Payer: Self-pay | Admitting: Nurse Practitioner

## 2021-09-21 VITALS — HR 107

## 2021-09-21 DIAGNOSIS — Z171 Estrogen receptor negative status [ER-]: Secondary | ICD-10-CM | POA: Diagnosis not present

## 2021-09-21 DIAGNOSIS — M8580 Other specified disorders of bone density and structure, unspecified site: Secondary | ICD-10-CM | POA: Diagnosis not present

## 2021-09-21 DIAGNOSIS — G9389 Other specified disorders of brain: Secondary | ICD-10-CM | POA: Diagnosis not present

## 2021-09-21 DIAGNOSIS — C3490 Malignant neoplasm of unspecified part of unspecified bronchus or lung: Secondary | ICD-10-CM

## 2021-09-21 DIAGNOSIS — Z79899 Other long term (current) drug therapy: Secondary | ICD-10-CM | POA: Insufficient documentation

## 2021-09-21 DIAGNOSIS — C3412 Malignant neoplasm of upper lobe, left bronchus or lung: Secondary | ICD-10-CM | POA: Diagnosis not present

## 2021-09-21 DIAGNOSIS — C7931 Secondary malignant neoplasm of brain: Secondary | ICD-10-CM | POA: Diagnosis not present

## 2021-09-21 DIAGNOSIS — C50211 Malignant neoplasm of upper-inner quadrant of right female breast: Secondary | ICD-10-CM | POA: Insufficient documentation

## 2021-09-21 DIAGNOSIS — Z5111 Encounter for antineoplastic chemotherapy: Secondary | ICD-10-CM | POA: Insufficient documentation

## 2021-09-21 DIAGNOSIS — C50919 Malignant neoplasm of unspecified site of unspecified female breast: Secondary | ICD-10-CM | POA: Diagnosis not present

## 2021-09-21 DIAGNOSIS — Z515 Encounter for palliative care: Secondary | ICD-10-CM | POA: Diagnosis not present

## 2021-09-21 DIAGNOSIS — L89322 Pressure ulcer of left buttock, stage 2: Secondary | ICD-10-CM | POA: Diagnosis not present

## 2021-09-21 DIAGNOSIS — Z7189 Other specified counseling: Secondary | ICD-10-CM

## 2021-09-21 DIAGNOSIS — M1990 Unspecified osteoarthritis, unspecified site: Secondary | ICD-10-CM | POA: Diagnosis not present

## 2021-09-21 DIAGNOSIS — R739 Hyperglycemia, unspecified: Secondary | ICD-10-CM

## 2021-09-21 DIAGNOSIS — E782 Mixed hyperlipidemia: Secondary | ICD-10-CM | POA: Diagnosis not present

## 2021-09-21 DIAGNOSIS — R530 Neoplastic (malignant) related fatigue: Secondary | ICD-10-CM | POA: Diagnosis not present

## 2021-09-21 DIAGNOSIS — D509 Iron deficiency anemia, unspecified: Secondary | ICD-10-CM | POA: Diagnosis not present

## 2021-09-21 DIAGNOSIS — Z95828 Presence of other vascular implants and grafts: Secondary | ICD-10-CM

## 2021-09-21 DIAGNOSIS — Z853 Personal history of malignant neoplasm of breast: Secondary | ICD-10-CM | POA: Diagnosis not present

## 2021-09-21 DIAGNOSIS — Z9181 History of falling: Secondary | ICD-10-CM | POA: Diagnosis not present

## 2021-09-21 DIAGNOSIS — Z510 Encounter for antineoplastic radiation therapy: Secondary | ICD-10-CM | POA: Diagnosis not present

## 2021-09-21 DIAGNOSIS — I10 Essential (primary) hypertension: Secondary | ICD-10-CM | POA: Diagnosis not present

## 2021-09-21 DIAGNOSIS — Z9012 Acquired absence of left breast and nipple: Secondary | ICD-10-CM | POA: Diagnosis not present

## 2021-09-21 DIAGNOSIS — Z791 Long term (current) use of non-steroidal anti-inflammatories (NSAID): Secondary | ICD-10-CM | POA: Diagnosis not present

## 2021-09-21 LAB — CBC WITH DIFFERENTIAL (CANCER CENTER ONLY)
Abs Immature Granulocytes: 0.04 10*3/uL (ref 0.00–0.07)
Basophils Absolute: 0 10*3/uL (ref 0.0–0.1)
Basophils Relative: 0 %
Eosinophils Absolute: 0 10*3/uL (ref 0.0–0.5)
Eosinophils Relative: 1 %
HCT: 33.8 % — ABNORMAL LOW (ref 36.0–46.0)
Hemoglobin: 11.4 g/dL — ABNORMAL LOW (ref 12.0–15.0)
Immature Granulocytes: 1 %
Lymphocytes Relative: 4 %
Lymphs Abs: 0.2 10*3/uL — ABNORMAL LOW (ref 0.7–4.0)
MCH: 29.8 pg (ref 26.0–34.0)
MCHC: 33.7 g/dL (ref 30.0–36.0)
MCV: 88.5 fL (ref 80.0–100.0)
Monocytes Absolute: 0.4 10*3/uL (ref 0.1–1.0)
Monocytes Relative: 9 %
Neutro Abs: 3.9 10*3/uL (ref 1.7–7.7)
Neutrophils Relative %: 85 %
Platelet Count: 218 10*3/uL (ref 150–400)
RBC: 3.82 MIL/uL — ABNORMAL LOW (ref 3.87–5.11)
RDW: 14.8 % (ref 11.5–15.5)
WBC Count: 4.6 10*3/uL (ref 4.0–10.5)
nRBC: 0 % (ref 0.0–0.2)

## 2021-09-21 LAB — CMP (CANCER CENTER ONLY)
ALT: 25 U/L (ref 0–44)
AST: 14 U/L — ABNORMAL LOW (ref 15–41)
Albumin: 3.3 g/dL — ABNORMAL LOW (ref 3.5–5.0)
Alkaline Phosphatase: 58 U/L (ref 38–126)
Anion gap: 5 (ref 5–15)
BUN: 14 mg/dL (ref 8–23)
CO2: 28 mmol/L (ref 22–32)
Calcium: 9.4 mg/dL (ref 8.9–10.3)
Chloride: 103 mmol/L (ref 98–111)
Creatinine: 0.46 mg/dL (ref 0.44–1.00)
GFR, Estimated: >60 mL/min (ref >60)
Glucose, Bld: 115 mg/dL — ABNORMAL HIGH (ref 70–99)
Potassium: 3.7 mmol/L (ref 3.5–5.1)
Sodium: 136 mmol/L (ref 135–145)
Total Bilirubin: 0.5 mg/dL (ref 0.3–1.2)
Total Protein: 6.2 g/dL — ABNORMAL LOW (ref 6.5–8.1)

## 2021-09-21 LAB — RAD ONC ARIA SESSION SUMMARY
Course Elapsed Days: 36
Plan Fractions Treated to Date: 15
Plan Prescribed Dose Per Fraction: 2 Gy
Plan Total Fractions Prescribed: 30
Plan Total Prescribed Dose: 60 Gy
Reference Point Dosage Given to Date: 30 Gy
Reference Point Session Dosage Given: 2 Gy
Session Number: 20

## 2021-09-21 MED ORDER — GLUCOSE BLOOD VI STRP
ORAL_STRIP | 1 refills | Status: DC
  Filled 2021-09-21: qty 100, 25d supply, fill #0
  Filled 2021-10-22: qty 100, 25d supply, fill #1

## 2021-09-21 MED ORDER — SODIUM CHLORIDE 0.9% FLUSH
10.0000 mL | INTRAVENOUS | Status: DC | PRN
  Administered 2021-09-21: 10 mL

## 2021-09-21 MED ORDER — ACCU-CHEK SOFTCLIX LANCETS MISC
1 refills | Status: DC
  Filled 2021-09-21: qty 100, 25d supply, fill #0
  Filled 2021-10-22: qty 100, 25d supply, fill #1

## 2021-09-21 MED ORDER — SODIUM CHLORIDE 0.9 % IV SOLN
169.8000 mg | Freq: Once | INTRAVENOUS | Status: AC
  Administered 2021-09-21: 170 mg via INTRAVENOUS
  Filled 2021-09-21: qty 17

## 2021-09-21 MED ORDER — SODIUM CHLORIDE 0.9 % IV SOLN: Freq: Once | INTRAVENOUS | Status: AC

## 2021-09-21 MED ORDER — SODIUM CHLORIDE 0.9% FLUSH
10.0000 mL | Freq: Once | INTRAVENOUS | Status: AC
  Administered 2021-09-21: 10 mL

## 2021-09-21 MED ORDER — SODIUM CHLORIDE 0.9 % IV SOLN
10.0000 mg | Freq: Once | INTRAVENOUS | Status: AC
  Administered 2021-09-21: 10 mg via INTRAVENOUS
  Filled 2021-09-21: qty 10

## 2021-09-21 MED ORDER — HEPARIN SOD (PORK) LOCK FLUSH 100 UNIT/ML IV SOLN
500.0000 [IU] | Freq: Once | INTRAVENOUS | Status: AC | PRN
  Administered 2021-09-21: 500 [IU]

## 2021-09-21 MED ORDER — DIPHENHYDRAMINE HCL 50 MG/ML IJ SOLN
50.0000 mg | Freq: Once | INTRAMUSCULAR | Status: AC
  Administered 2021-09-21: 50 mg via INTRAVENOUS
  Filled 2021-09-21: qty 1

## 2021-09-21 MED ORDER — PALONOSETRON HCL INJECTION 0.25 MG/5ML
0.2500 mg | Freq: Once | INTRAVENOUS | Status: AC
  Administered 2021-09-21: 0.25 mg via INTRAVENOUS
  Filled 2021-09-21: qty 5

## 2021-09-21 MED ORDER — FAMOTIDINE IN NACL 20-0.9 MG/50ML-% IV SOLN
20.0000 mg | Freq: Once | INTRAVENOUS | Status: AC
  Administered 2021-09-21: 20 mg via INTRAVENOUS
  Filled 2021-09-21: qty 50

## 2021-09-21 MED ORDER — SODIUM CHLORIDE 0.9 % IV SOLN
45.0000 mg/m2 | Freq: Once | INTRAVENOUS | Status: AC
  Administered 2021-09-21: 84 mg via INTRAVENOUS
  Filled 2021-09-21: qty 14

## 2021-09-21 NOTE — Progress Notes (Signed)
[facility]  [phone] [phone]   Name: Jennifer Cowan Date: 09/21/2021 MRN: [id number]  DOB: [birth year]  Patient Care Team: [name], FNP as PCP - General (Family Medicine)    INTERVAL HISTORY: Jennifer Cowan is a 76 y.o. 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 Palliative ask to see for symptom management and goals of care.   SOCIAL HISTORY:     reports that she has never smoked. She has never used smokeless tobacco. She reports that she does not drink alcohol and does not use drugs.  ADVANCE DIRECTIVES:  Patient does not have an advanced directive however is interested in completing.   CODE STATUS:   PAST MEDICAL HISTORY: Past Medical History:  Diagnosis Date   Anemia    Arthritis    Breast cancer ([place])    Cancer ([place]) 1999   Breast Cancer   Hyperlipidemia    Hypertension    Osteopenia 01/12/2021    ALLERGIES:  has No Known Allergies.  MEDICATIONS:  Current Outpatient Medications  Medication Sig Dispense Refill   Accu-Chek Softclix Lancets lancets Use as directed 4 times a day 100 each 1   acetaminophen (TYLENOL) 500 MG tablet Take 1,000 mg by mouth every 6 (six) hours as needed for moderate pain.     amLODipine (NORVASC) 10 MG tablet TAKE 1 TABLET BY MOUTH DAILY 90 tablet 3   benazepril (LOTENSIN) 10 MG tablet TAKE 1 TABLET BY MOUTH DAILY 90 tablet 3   blood glucose meter kit and supplies KIT Dispense based on patient and insurance preference. Use up to four times daily as directed. 1 each 0   Blood Glucose Monitoring Suppl (ACCU-CHEK GUIDE) w/Device KIT Use as directed 4 times a day 1 kit 0   Cholecalciferol (VITAMIN D3) 125 MCG (5000 UT) CAPS Take 5,000 Units by mouth daily.     dexamethasone (DECADRON) 4 MG tablet Take 1 tablet (4 mg  total) by mouth 3 (three) times daily. 90 tablet 1   ezetimibe (ZETIA) 10 MG tablet Take 1 tablet (10 mg total) by mouth daily. 90 tablet 3   glucose 4 GM chewable tablet Chew 1 tablet (4 g total) by mouth as needed for low blood sugar. 50 tablet 0   glucose blood test strip Use as directed 4 times a day 100 each 1   insulin glargine (LANTUS SOLOSTAR) 100 UNIT/ML Solostar Pen Inject 10 Units into the skin at bedtime. 15 mL 1   Insulin Pen Needle 31G X 5 MM MISC 1 each by Does not apply route daily. 90 each 1   insulin regular (NOVOLIN R) 100 units/mL injection If blood sugar is 200 or less: 0 units 201-250: 2 units 251-300: 4 units 301-350: 6 units 351-400: 8 units 401 or higher: 10 units and call MD 10 mL 1   Insulin Syringe-Needle U-100 (INSULIN SYRINGE .3CC/29GX1") 29G X 1" 0.3 ML MISC 1 Dose by Does not apply route 3 (three) times daily before meals. 90 each 1   LORazepam (ATIVAN) 0.5 MG tablet 1 tab po 30 minutes prior to radiation or MRI scans 30 tablet 0   meloxicam (MOBIC) 7.5 MG tablet TAKE 1 TABLET BY MOUTH DAILY 90 tablet 3   prochlorperazine (COMPAZINE) 10 MG tablet Take 1 tablet (10 mg total) by mouth every 6 (six) hours as needed for nausea or  vomiting. 30 tablet 1   simvastatin (ZOCOR) 40 MG tablet TAKE 1 TABLET BY MOUTH DAILY 90 tablet 3   trolamine salicylate (ASPERCREME) 10 % cream Apply 1 Application topically daily as needed for muscle pain.     No current facility-administered medications for this visit.   Facility-Administered Medications Ordered in Other Visits  Medication Dose Route Frequency Provider Last Rate Last Admin   acetaminophen (TYLENOL) 325 MG tablet            CARBOplatin (PARAPLATIN) 170 mg in sodium chloride 0.9 % 100 mL chemo infusion  170 mg Intravenous Once Iruku, Arletha Pili, MD       cyanocobalamin (VITAMIN B12) 1000 MCG/ML injection            dexamethasone (DECADRON) 10 mg in sodium chloride 0.9 % 50 mL IVPB  10 mg Intravenous Once Iruku, Arletha Pili, MD        diphenhydrAMINE (BENADRYL) 25 mg capsule            diphenhydrAMINE (BENADRYL) injection 50 mg  50 mg Intravenous Once Iruku, Arletha Pili, MD       famotidine (PEPCID) IVPB 20 mg premix  20 mg Intravenous Once Iruku, Praveena, MD       heparin lock flush 100 unit/mL  500 Units Intracatheter Once PRN Iruku, Arletha Pili, MD       PACLitaxel (TAXOL) 84 mg in sodium chloride 0.9 % 250 mL chemo infusion (</= 36m/m2)  45 mg/m2 (Treatment Plan Recorded) Intravenous Once Iruku, PArletha Pili MD       palonosetron (ALOXI) injection 0.25 mg  0.25 mg Intravenous Once Iruku, Praveena, MD       sodium chloride flush (NS) 0.9 % injection 10 mL  10 mL Intracatheter PRN Iruku, Praveena, MD        VITAL SIGNS: There were no vitals taken for this visit. There were no vitals filed for this visit.  Estimated body mass index is 26.3 kg/m as calculated from the following:   Height as of an earlier encounter on 09/21/21: '5\' 8"'  (1.727 m).   Weight as of an earlier encounter on 09/21/21: 173 lb (78.5 kg).   PERFORMANCE STATUS (ECOG) : 2 - Symptomatic, <50% confined to bed   Physical Exam General: NAD, in wheelchair  Cardiovascular: regular rate and rhythm Pulmonary: normal breathing pattern  Abdomen: soft, nontender, + bowel sounds Extremities: no edema, no joint deformities Skin: no rashes Neurological: AAO x3, mood appropriate   IMPRESSION: Jennifer Cowan to clinic today for follow-up. She is in a wheelchair. Much more alert and interactive than previous visit. States she is feeling well and is appreciative of this. Her sister and niece are with her today.   She is doing much better. Tolerating treatments. Is more interactive per family. Denies pain or discomfort. Denies nausea, vomiting, constipation, or diarrhea.   Expressed goals are to continue treating the treatable allowing Jennifer Cowan to continue to thrive. Patient and family appreciative of her current state of health.   No symptom management needs.    I discussed the importance of continued conversation with family and their medical providers regarding overall plan of care and treatment options, ensuring decisions are within the context of the patients values and GOCs.  PLAN: Ongoing goals of care and support. Patient with some improvement.  No symptom needs.  Will plan to follow-up in 4-6 weeks in collaboration with other oncology appointments.    Patient expressed understanding and was in agreement with this plan. She also understands that She  can call the clinic at any time with any questions, concerns, or complaints.   Any controlled substances utilized were prescribed in the context of palliative care. PDMP has been reviewed.   Time Total: 20 min   Visit consisted of counseling and education dealing with the complex and emotionally intense issues of symptom management and palliative care in the setting of serious and potentially life-threatening illness.Greater than 50%  of this time was spent counseling and coordinating care related to the above assessment and plan.  Alda Lea, AGPCNP-BC  Palliative Medicine Team/[facility]

## 2021-09-21 NOTE — Assessment & Plan Note (Addendum)
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  She also appears to have had some regional adenopathy including prevascular, AP window and left hilar adenopathy.  She has multiple intracranial metastatic lesions which are likely secondary to metastatic lung cancer given presence of regional adenopathy.  She is now status post SRS to the brain lesions.  She started concurrent chemoradiation with CarboTaxol on August 30, 2021.  She got 2 weekly cycles of chemotherapy so far. Although IS planning talks about 3 weekly cycles of chemotherapy so far, she did not receive the second cycle as planned hence she only received 2 weekly cycles.  Today is her cycle 3-day 1.  She appears to be doing significantly better compared to her previous visits.  Okay to proceed with chemotherapy as long as labs are within parameters.  Her last cycle of chemotherapy is currently planned for September 26.  Last day of radiation is planned for September 29.  We will plan to repeat imaging after completion of definitive concurrent chemoradiation.

## 2021-09-21 NOTE — Progress Notes (Signed)
[facility] Follow up:    Jennifer Cowan, [place] [address]  DIAGNOSIS: lung cancer  SUMMARY OF ONCOLOGIC HISTORY: Oncology History  Malignant neoplasm of unspecified part of unspecified bronchus or lung ([place])  08/13/2021 Initial Diagnosis   Malignant neoplasm of unspecified part of unspecified bronchus or lung ([place])   08/30/2021 - 09/13/2021 Chemotherapy   Patient is on Treatment Plan : LUNG Carboplatin / Paclitaxel + XRT q7d     08/30/2021 -  Chemotherapy   Patient is on Treatment Plan : LUNG Carboplatin + Paclitaxel + XRT q7d       CURRENT THERAPY: chemoradiation  INTERVAL HISTORY:  Jennifer Cowan 75 y.o. female returns for f/u. Jennifer Cowan is here with her sister and her niece.   She appeared to be in better mood today.  She was answering all the questions promptly.  She felt well.  She states that the breast mass appears to be smaller.  No side effects from last cycle of chemotherapy.  No neuropathy reported.  She has however noticed that her hair has thinned down quite a bit. 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  MEDICAL HISTORY: Past Medical History:  Diagnosis Date   Anemia    Arthritis    Breast  cancer ([place])    Cancer ([place]) 1999   Breast Cancer   Hyperlipidemia    Hypertension    Osteopenia 01/12/2021    SURGICAL HISTORY: Past Surgical History:  Procedure Laterality Date   BREAST SURGERY  1999   Masectomy- left   BRONCHIAL BIOPSY  07/30/2021   Procedure: BRONCHIAL BIOPSIES;  Surgeon: Collene Gobble, MD;  Location: [facility] ENDOSCOPY;  Service: Pulmonary;;   BRONCHIAL BRUSHINGS  07/30/2021   Procedure: BRONCHIAL BRUSHINGS;  Surgeon: Collene Gobble, MD;  Location: [facility] ENDOSCOPY;  Service: Pulmonary;;   BRONCHIAL NEEDLE ASPIRATION BIOPSY  07/30/2021   Procedure: BRONCHIAL NEEDLE ASPIRATION BIOPSIES;  Surgeon: Collene Gobble, MD;  Location: [facility] ENDOSCOPY;  Service: Pulmonary;;   CYSTECTOMY     cyst removed off top of head   IR IMAGING GUIDED PORT INSERTION  08/20/2021   MASTECTOMY     VIDEO BRONCHOSCOPY WITH RADIAL ENDOBRONCHIAL ULTRASOUND  07/30/2021   Procedure: VIDEO BRONCHOSCOPY WITH RADIAL ENDOBRONCHIAL ULTRASOUND;  Surgeon: Collene Gobble, MD;  Location: [facility] ENDOSCOPY;  Service: Pulmonary;;    SOCIAL HISTORY: Social History   Socioeconomic History   Marital status: Single    Spouse name: Not on file   Number of children: 0   Years of education: 12   Highest education level: High school graduate  Occupational History   Occupation: retired  Tobacco Use   Smoking status: Never   Smokeless tobacco: Never  Vaping Use   Vaping Use:  Never used  Substance and Sexual Activity   Alcohol use: No   Drug use: Never   Sexual activity: Yes    Birth control/protection: Post-menopausal  Other Topics Concern   Not on file  Social History Narrative   Not on file   Social Determinants of Health   Financial Resource Strain: Not on file  Food Insecurity: Not on file  Transportation Needs: Not on file  Physical Activity: Not on file  Stress: Not on file  Social Connections: Not on file  Intimate Partner Violence: Not on file    FAMILY HISTORY: Family History  Problem  Relation Age of Onset   Arthritis Mother    Breast cancer Sister    Cancer Sister        Breast Cancer   Diabetes Sister    Hyperlipidemia Sister    Hypertension Sister    Stroke Sister    Diabetes Brother    Hypertension Brother     Review of Systems  Constitutional:  Positive for fatigue. Negative for appetite change, chills, fever and unexpected weight change.  HENT:   Negative for hearing loss, lump/mass and trouble swallowing.   Eyes:  Negative for eye problems and icterus.  Respiratory:  Negative for chest tightness, cough and shortness of breath.   Cardiovascular:  Negative for chest pain, leg swelling and palpitations.  Gastrointestinal:  Negative for abdominal distention, abdominal pain, constipation, diarrhea, nausea and vomiting.  Endocrine: Negative for hot flashes.  Genitourinary:  Negative for difficulty urinating.   Musculoskeletal:  Negative for arthralgias.  Skin:  Negative for itching and rash.  Neurological:  Negative for dizziness, extremity weakness, headaches and numbness.  Hematological:  Negative for adenopathy. Does not bruise/bleed easily.  Psychiatric/Behavioral:  Negative for depression. The patient is not nervous/anxious.       PHYSICAL EXAMINATION  ECOG PERFORMANCE STATUS: 2 - Symptomatic, <50% confined to bed  Vitals:   09/21/21 1219  BP: 98/60  Pulse: (!) 112  Resp: 16  Temp: 97.8 F (36.6 C)  SpO2: 97%    Physical Exam Constitutional:      General: She is not in acute distress.    Appearance: Normal appearance. She is not toxic-appearing.     Comments: She appears much more interactive today  HENT:     Head: Normocephalic and atraumatic.  Eyes:     General: No scleral icterus. Cardiovascular:     Rate and Rhythm: Normal rate and regular rhythm.     Pulses: Normal pulses.     Heart sounds: Normal heart sounds.  Pulmonary:     Effort: Pulmonary effort is normal.     Breath sounds: Normal breath sounds.  Chest:     Comments:  Right breast mass appears to be smaller on exam today. Abdominal:     General: Abdomen is flat. Bowel sounds are normal. There is no distension.     Palpations: Abdomen is soft.     Tenderness: There is no abdominal tenderness.  Musculoskeletal:        General: No swelling.     Cervical back: Neck supple.  Lymphadenopathy:     Cervical: No cervical adenopathy.  Skin:    General: Skin is warm and dry.     Findings: No rash.  Neurological:     Mental Status: She is alert and oriented to person, place, and time.  Psychiatric:        Mood and Affect: Mood normal.        Behavior: Behavior  normal.     LABORATORY DATA:  CBC    Component Value Date/Time   WBC 4.6 09/21/2021 1204   WBC 6.7 07/25/2021 0111   RBC 3.82 (L) 09/21/2021 1204   HGB 11.4 (L) 09/21/2021 1204   HGB 15.7 07/07/2021 1538   HCT 33.8 (L) 09/21/2021 1204   HCT 46.6 07/07/2021 1538   PLT 218 09/21/2021 1204   PLT 450 07/07/2021 1538   MCV 88.5 09/21/2021 1204   MCV 88 07/07/2021 1538   MCH 29.8 09/21/2021 1204   MCHC 33.7 09/21/2021 1204   RDW 14.8 09/21/2021 1204   RDW 12.9 07/07/2021 1538   LYMPHSABS 0.2 (L) 09/21/2021 1204   LYMPHSABS 1.6 07/07/2021 1538   MONOABS 0.4 09/21/2021 1204   EOSABS 0.0 09/21/2021 1204   EOSABS 0.1 07/07/2021 1538   BASOSABS 0.0 09/21/2021 1204   BASOSABS 0.0 07/07/2021 1538    CMP     Component Value Date/Time   NA 136 09/21/2021 1204   NA 141 07/07/2021 1538   K 3.7 09/21/2021 1204   CL 103 09/21/2021 1204   CO2 28 09/21/2021 1204   GLUCOSE 115 (H) 09/21/2021 1204   BUN 14 09/21/2021 1204   BUN 9 07/07/2021 1538   CREATININE 0.46 09/21/2021 1204   CALCIUM 9.4 09/21/2021 1204   PROT 6.2 (L) 09/21/2021 1204   PROT 7.9 07/07/2021 1538   ALBUMIN 3.3 (L) 09/21/2021 1204   ALBUMIN 4.6 07/07/2021 1538   AST 14 (L) 09/21/2021 1204   ALT 25 09/21/2021 1204   ALKPHOS 58 09/21/2021 1204   BILITOT 0.5 09/21/2021 1204   GFRNONAA >60 09/21/2021 1204   GFRAA 103  03/05/2020 1119     ASSESSMENT and THERAPY PLAN:   Malignant neoplasm of unspecified part of unspecified bronchus or lung ([place]) This is a 76 year old patient with 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  She also appears to have had some regional adenopathy including prevascular, AP window and left hilar adenopathy.  She has multiple intracranial metastatic lesions which are likely secondary to metastatic lung cancer given presence of regional adenopathy.  She is now status post SRS to the brain lesions.  She started concurrent chemoradiation with CarboTaxol on August 30, 2021.  She got 2 weekly cycles of chemotherapy so far. Although IS planning talks about 3 weekly cycles of chemotherapy so far, she did not receive the second cycle as planned hence she only received 2 weekly cycles.  Today is her cycle 3-day 1.  She appears to be doing significantly better compared to her previous visits.  Okay to proceed with chemotherapy as long as labs are within parameters.  Her last cycle of chemotherapy is currently planned for September 26.  Last day of radiation is planned for September 29.  We will plan to repeat imaging after completion of definitive concurrent chemoradiation.  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  There is no evidence of axillary lymphadenopathy on imaging. We have discussed her in the breast [place].  It appears that she may have locally advanced breast cancer without any involvement of axillary lymph nodes or other metastatic disease related to breast cancer.  Since CarboTaxol is also active in breast cancer, her breast mass appears smaller in size today consistent with  response Foundation 1 testing on the breast specimen with microsatellite status, stable, TMB 4 mutations per MB, some other genomic findings but no therapeutic implications.  PD-L1 showed a CPS score of 10 from the breast specimen She may get adjuvant immunotherapy for maintenance for her lung cancer which again will be active in triple negative biology.  We will continue to monitor  Total encounter time:30 minutes*in face-to-face visit time, chart review, lab review, care coordination, order entry, and documentation of the encounter time.  *Total Encounter Time as defined by the Centers for Medicare and Medicaid Services includes, in addition to the face-to-face time of a patient visit (documented in the note above) non-face-to-face time: obtaining and reviewing outside history, ordering and reviewing medications, tests or procedures, care coordination (communications with other health care professionals or caregivers) and documentation in the medical record.

## 2021-09-21 NOTE — Progress Notes (Signed)
Per Dr. Chryl Heck, okay to treat with elevated heart rate

## 2021-09-21 NOTE — Patient Instructions (Signed)
[facility]  Discharge Instructions: Thank you for choosing [facility] to provide your oncology and hematology care.   If you have a lab appointment with the [facility], please go directly to the [facility] and check in at the registration area.   Wear comfortable clothing and clothing appropriate for easy access to any Portacath or PICC line.   We strive to give you quality time with your provider. You may need to reschedule your appointment if you arrive late (15 or more minutes).  Arriving late affects you and other patients whose appointments are after yours.  Also, if you miss three or more appointments without notifying the office, you may be dismissed from the clinic at the provider's discretion.      For prescription refill requests, have your pharmacy contact our office and allow 72 hours for refills to be completed.    Today you received the following chemotherapy and/or immunotherapy agents: Paclitaxel and Carboplatin      To help prevent nausea and vomiting after your treatment, we encourage you to take your nausea medication as directed.  BELOW ARE SYMPTOMS THAT SHOULD BE REPORTED IMMEDIATELY: *FEVER GREATER THAN 100.4 F (38 C) OR HIGHER *CHILLS OR SWEATING *NAUSEA AND VOMITING THAT IS NOT CONTROLLED WITH YOUR NAUSEA MEDICATION *UNUSUAL SHORTNESS OF BREATH *UNUSUAL BRUISING OR BLEEDING *URINARY PROBLEMS (pain or burning when urinating, or frequent urination) *BOWEL PROBLEMS (unusual diarrhea, constipation, pain near the anus) TENDERNESS IN MOUTH AND THROAT WITH OR WITHOUT PRESENCE OF ULCERS (sore throat, sores in mouth, or a toothache) UNUSUAL RASH, SWELLING OR PAIN  UNUSUAL VAGINAL DISCHARGE OR ITCHING   Items with * indicate a potential emergency and should be followed up as soon as possible or go to the Emergency Department if any problems should occur.  Please show the CHEMOTHERAPY ALERT CARD or IMMUNOTHERAPY ALERT  CARD at check-in to the Emergency Department and triage nurse.  Should you have questions after your visit or need to cancel or reschedule your appointment, please contact [facility]  Dept: [phone]  and follow the prompts.  Office hours are 8:00 a.m. to 4:30 p.m. Monday - Friday. Please note that voicemails left after 4:00 p.m. may not be returned until the following business day.  We are closed weekends and major holidays. You have access to a nurse at all times for urgent questions. Please call the main number to the clinic Dept: [phone] and follow the prompts.   For any non-urgent questions, you may also contact your provider using MyChart. We now offer e-Visits for anyone 29 and older to request care online for non-urgent symptoms. For details visit [email or web address].   Also download the MyChart app! Go to the app store, search "MyChart", open the app, select [facility], and log in with your MyChart username and password.  Masks are optional in the cancer centers. If you would like for your care team to wear a mask while they are taking care of you, please let them know. You may have one support Jennifer Cowan who is at least 76 years old accompany you for your appointments.

## 2021-09-21 NOTE — Assessment & Plan Note (Addendum)
Mammogram done Jun 15, 2021 with concern for right breast mass warranting further investigation.  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  There is no evidence of axillary lymphadenopathy on imaging. We have discussed her in the breast [place].  It appears that she may have locally advanced breast cancer without any involvement of axillary lymph nodes or other metastatic disease related to breast cancer.  Since CarboTaxol is also active in breast cancer, her breast mass appears smaller in size today consistent with response Foundation 1 testing on the breast specimen with microsatellite status, stable, TMB 4 mutations per MB, some other genomic findings but no therapeutic implications.  PD-L1 showed a CPS score of 10 from the breast specimen She may get adjuvant immunotherapy for maintenance for her lung cancer which again will be active in triple negative biology.  We will continue to monitor

## 2021-09-22 ENCOUNTER — Other Ambulatory Visit (HOSPITAL_COMMUNITY): Payer: Self-pay

## 2021-09-22 ENCOUNTER — Other Ambulatory Visit: Payer: Self-pay

## 2021-09-22 ENCOUNTER — Ambulatory Visit
Admission: RE | Admit: 2021-09-22 | Discharge: 2021-09-22 | Disposition: A | Discharge status: Home / Self Care | Payer: Medicare Other | Source: Ambulatory Visit | Attending: Radiation Oncology | Admitting: Radiation Oncology

## 2021-09-22 DIAGNOSIS — C7931 Secondary malignant neoplasm of brain: Secondary | ICD-10-CM | POA: Diagnosis not present

## 2021-09-22 DIAGNOSIS — M8580 Other specified disorders of bone density and structure, unspecified site: Secondary | ICD-10-CM | POA: Diagnosis not present

## 2021-09-22 DIAGNOSIS — Z9181 History of falling: Secondary | ICD-10-CM | POA: Diagnosis not present

## 2021-09-22 DIAGNOSIS — Z510 Encounter for antineoplastic radiation therapy: Secondary | ICD-10-CM | POA: Diagnosis not present

## 2021-09-22 DIAGNOSIS — Z791 Long term (current) use of non-steroidal anti-inflammatories (NSAID): Secondary | ICD-10-CM | POA: Diagnosis not present

## 2021-09-22 DIAGNOSIS — Z853 Personal history of malignant neoplasm of breast: Secondary | ICD-10-CM | POA: Diagnosis not present

## 2021-09-22 DIAGNOSIS — E782 Mixed hyperlipidemia: Secondary | ICD-10-CM | POA: Diagnosis not present

## 2021-09-22 DIAGNOSIS — Z79899 Other long term (current) drug therapy: Secondary | ICD-10-CM | POA: Diagnosis not present

## 2021-09-22 DIAGNOSIS — C50211 Malignant neoplasm of upper-inner quadrant of right female breast: Secondary | ICD-10-CM | POA: Diagnosis not present

## 2021-09-22 DIAGNOSIS — G9389 Other specified disorders of brain: Secondary | ICD-10-CM | POA: Diagnosis not present

## 2021-09-22 DIAGNOSIS — I10 Essential (primary) hypertension: Secondary | ICD-10-CM | POA: Diagnosis not present

## 2021-09-22 DIAGNOSIS — Z5111 Encounter for antineoplastic chemotherapy: Secondary | ICD-10-CM | POA: Diagnosis not present

## 2021-09-22 DIAGNOSIS — D509 Iron deficiency anemia, unspecified: Secondary | ICD-10-CM | POA: Diagnosis not present

## 2021-09-22 DIAGNOSIS — Z9012 Acquired absence of left breast and nipple: Secondary | ICD-10-CM | POA: Diagnosis not present

## 2021-09-22 DIAGNOSIS — C3412 Malignant neoplasm of upper lobe, left bronchus or lung: Secondary | ICD-10-CM | POA: Diagnosis not present

## 2021-09-22 DIAGNOSIS — Z171 Estrogen receptor negative status [ER-]: Secondary | ICD-10-CM | POA: Diagnosis not present

## 2021-09-22 DIAGNOSIS — M1990 Unspecified osteoarthritis, unspecified site: Secondary | ICD-10-CM | POA: Diagnosis not present

## 2021-09-22 LAB — RAD ONC ARIA SESSION SUMMARY
Course Elapsed Days: 37
Plan Fractions Treated to Date: 16
Plan Prescribed Dose Per Fraction: 2 Gy
Plan Total Fractions Prescribed: 30
Plan Total Prescribed Dose: 60 Gy
Reference Point Dosage Given to Date: 32 Gy
Reference Point Session Dosage Given: 2 Gy
Session Number: 21

## 2021-09-23 ENCOUNTER — Other Ambulatory Visit: Payer: Self-pay

## 2021-09-23 ENCOUNTER — Ambulatory Visit
Admission: RE | Admit: 2021-09-23 | Discharge: 2021-09-23 | Disposition: A | Discharge status: Home / Self Care | Payer: Medicare Other | Source: Ambulatory Visit | Attending: Radiation Oncology | Admitting: Radiation Oncology

## 2021-09-23 DIAGNOSIS — C7931 Secondary malignant neoplasm of brain: Secondary | ICD-10-CM | POA: Diagnosis not present

## 2021-09-23 DIAGNOSIS — Z171 Estrogen receptor negative status [ER-]: Secondary | ICD-10-CM | POA: Diagnosis not present

## 2021-09-23 DIAGNOSIS — Z5111 Encounter for antineoplastic chemotherapy: Secondary | ICD-10-CM | POA: Diagnosis not present

## 2021-09-23 DIAGNOSIS — Z510 Encounter for antineoplastic radiation therapy: Secondary | ICD-10-CM | POA: Diagnosis not present

## 2021-09-23 DIAGNOSIS — C50211 Malignant neoplasm of upper-inner quadrant of right female breast: Secondary | ICD-10-CM | POA: Diagnosis not present

## 2021-09-23 DIAGNOSIS — C3412 Malignant neoplasm of upper lobe, left bronchus or lung: Secondary | ICD-10-CM | POA: Diagnosis not present

## 2021-09-23 DIAGNOSIS — Z79899 Other long term (current) drug therapy: Secondary | ICD-10-CM | POA: Diagnosis not present

## 2021-09-23 LAB — RAD ONC ARIA SESSION SUMMARY
Course Elapsed Days: 38
Plan Fractions Treated to Date: 17
Plan Prescribed Dose Per Fraction: 2 Gy
Plan Total Fractions Prescribed: 30
Plan Total Prescribed Dose: 60 Gy
Reference Point Dosage Given to Date: 34 Gy
Reference Point Session Dosage Given: 2 Gy
Session Number: 22

## 2021-09-24 ENCOUNTER — Ambulatory Visit
Admission: RE | Admit: 2021-09-24 | Discharge: 2021-09-24 | Disposition: A | Discharge status: Home / Self Care | Payer: Medicare Other | Source: Ambulatory Visit | Attending: Radiation Oncology | Admitting: Radiation Oncology

## 2021-09-24 ENCOUNTER — Other Ambulatory Visit: Payer: Self-pay

## 2021-09-24 DIAGNOSIS — Z79899 Other long term (current) drug therapy: Secondary | ICD-10-CM | POA: Diagnosis not present

## 2021-09-24 DIAGNOSIS — C7931 Secondary malignant neoplasm of brain: Secondary | ICD-10-CM | POA: Diagnosis not present

## 2021-09-24 DIAGNOSIS — C50211 Malignant neoplasm of upper-inner quadrant of right female breast: Secondary | ICD-10-CM | POA: Diagnosis not present

## 2021-09-24 DIAGNOSIS — Z510 Encounter for antineoplastic radiation therapy: Secondary | ICD-10-CM | POA: Diagnosis not present

## 2021-09-24 DIAGNOSIS — C3412 Malignant neoplasm of upper lobe, left bronchus or lung: Secondary | ICD-10-CM | POA: Diagnosis not present

## 2021-09-24 DIAGNOSIS — Z171 Estrogen receptor negative status [ER-]: Secondary | ICD-10-CM | POA: Diagnosis not present

## 2021-09-24 DIAGNOSIS — Z5111 Encounter for antineoplastic chemotherapy: Secondary | ICD-10-CM | POA: Diagnosis not present

## 2021-09-24 LAB — RAD ONC ARIA SESSION SUMMARY
Course Elapsed Days: 39
Plan Fractions Treated to Date: 18
Plan Prescribed Dose Per Fraction: 2 Gy
Plan Total Fractions Prescribed: 30
Plan Total Prescribed Dose: 60 Gy
Reference Point Dosage Given to Date: 36 Gy
Reference Point Session Dosage Given: 2 Gy
Session Number: 23

## 2021-09-24 MED FILL — Dexamethasone Sodium Phosphate Inj 100 MG/10ML: INTRAMUSCULAR | Qty: 1 | Status: AC

## 2021-09-27 ENCOUNTER — Ambulatory Visit
Admission: RE | Admit: 2021-09-27 | Discharge: 2021-09-27 | Disposition: A | Discharge status: Home / Self Care | Payer: Medicare Other | Source: Ambulatory Visit | Attending: Radiation Oncology | Admitting: Radiation Oncology

## 2021-09-27 ENCOUNTER — Other Ambulatory Visit: Payer: Self-pay

## 2021-09-27 ENCOUNTER — Inpatient Hospital Stay: Payer: Medicare Other

## 2021-09-27 VITALS — BP 106/62 | HR 107 | Temp 98.2°F | Resp 17 | Wt 173.0 lb

## 2021-09-27 DIAGNOSIS — C7931 Secondary malignant neoplasm of brain: Secondary | ICD-10-CM | POA: Diagnosis not present

## 2021-09-27 DIAGNOSIS — C3412 Malignant neoplasm of upper lobe, left bronchus or lung: Secondary | ICD-10-CM | POA: Diagnosis not present

## 2021-09-27 DIAGNOSIS — R739 Hyperglycemia, unspecified: Secondary | ICD-10-CM

## 2021-09-27 DIAGNOSIS — C3490 Malignant neoplasm of unspecified part of unspecified bronchus or lung: Secondary | ICD-10-CM

## 2021-09-27 DIAGNOSIS — Z510 Encounter for antineoplastic radiation therapy: Secondary | ICD-10-CM | POA: Diagnosis not present

## 2021-09-27 DIAGNOSIS — C50211 Malignant neoplasm of upper-inner quadrant of right female breast: Secondary | ICD-10-CM | POA: Diagnosis not present

## 2021-09-27 DIAGNOSIS — Z5111 Encounter for antineoplastic chemotherapy: Secondary | ICD-10-CM | POA: Diagnosis not present

## 2021-09-27 DIAGNOSIS — Z171 Estrogen receptor negative status [ER-]: Secondary | ICD-10-CM | POA: Diagnosis not present

## 2021-09-27 DIAGNOSIS — Z79899 Other long term (current) drug therapy: Secondary | ICD-10-CM | POA: Diagnosis not present

## 2021-09-27 DIAGNOSIS — Z95828 Presence of other vascular implants and grafts: Secondary | ICD-10-CM

## 2021-09-27 LAB — CBC WITH DIFFERENTIAL (CANCER CENTER ONLY)
Abs Immature Granulocytes: 0.04 10*3/uL (ref 0.00–0.07)
Basophils Absolute: 0 10*3/uL (ref 0.0–0.1)
Basophils Relative: 0 %
Eosinophils Absolute: 0.2 10*3/uL (ref 0.0–0.5)
Eosinophils Relative: 5 %
HCT: 30.9 % — ABNORMAL LOW (ref 36.0–46.0)
Hemoglobin: 10.3 g/dL — ABNORMAL LOW (ref 12.0–15.0)
Immature Granulocytes: 1 %
Lymphocytes Relative: 5 %
Lymphs Abs: 0.2 10*3/uL — ABNORMAL LOW (ref 0.7–4.0)
MCH: 30 pg (ref 26.0–34.0)
MCHC: 33.3 g/dL (ref 30.0–36.0)
MCV: 90.1 fL (ref 80.0–100.0)
Monocytes Absolute: 0.4 10*3/uL (ref 0.1–1.0)
Monocytes Relative: 12 %
Neutro Abs: 2.5 10*3/uL (ref 1.7–7.7)
Neutrophils Relative %: 77 %
Platelet Count: 225 10*3/uL (ref 150–400)
RBC: 3.43 MIL/uL — ABNORMAL LOW (ref 3.87–5.11)
RDW: 15.3 % (ref 11.5–15.5)
WBC Count: 3.2 10*3/uL — ABNORMAL LOW (ref 4.0–10.5)
nRBC: 0 % (ref 0.0–0.2)

## 2021-09-27 LAB — CMP (CANCER CENTER ONLY)
ALT: 22 U/L (ref 0–44)
AST: 16 U/L (ref 15–41)
Albumin: 3.2 g/dL — ABNORMAL LOW (ref 3.5–5.0)
Alkaline Phosphatase: 50 U/L (ref 38–126)
Anion gap: 5 (ref 5–15)
BUN: 15 mg/dL (ref 8–23)
CO2: 28 mmol/L (ref 22–32)
Calcium: 9.1 mg/dL (ref 8.9–10.3)
Chloride: 103 mmol/L (ref 98–111)
Creatinine: 0.47 mg/dL (ref 0.44–1.00)
GFR, Estimated: >60 mL/min (ref >60)
Glucose, Bld: 140 mg/dL — ABNORMAL HIGH (ref 70–99)
Potassium: 3.5 mmol/L (ref 3.5–5.1)
Sodium: 136 mmol/L (ref 135–145)
Total Bilirubin: 0.4 mg/dL (ref 0.3–1.2)
Total Protein: 6 g/dL — ABNORMAL LOW (ref 6.5–8.1)

## 2021-09-27 LAB — RAD ONC ARIA SESSION SUMMARY
Course Elapsed Days: 42
Plan Fractions Treated to Date: 19
Plan Prescribed Dose Per Fraction: 2 Gy
Plan Total Fractions Prescribed: 30
Plan Total Prescribed Dose: 60 Gy
Reference Point Dosage Given to Date: 38 Gy
Reference Point Session Dosage Given: 2 Gy
Session Number: 24

## 2021-09-27 MED ORDER — SODIUM CHLORIDE 0.9 % IV SOLN
10.0000 mg | Freq: Once | INTRAVENOUS | Status: AC
  Administered 2021-09-27: 10 mg via INTRAVENOUS
  Filled 2021-09-27: qty 10

## 2021-09-27 MED ORDER — PALONOSETRON HCL INJECTION 0.25 MG/5ML
0.2500 mg | Freq: Once | INTRAVENOUS | Status: AC
  Administered 2021-09-27: 0.25 mg via INTRAVENOUS
  Filled 2021-09-27: qty 5

## 2021-09-27 MED ORDER — SODIUM CHLORIDE 0.9% FLUSH
10.0000 mL | INTRAVENOUS | Status: DC | PRN
  Administered 2021-09-27: 10 mL

## 2021-09-27 MED ORDER — HEPARIN SOD (PORK) LOCK FLUSH 100 UNIT/ML IV SOLN
500.0000 [IU] | Freq: Once | INTRAVENOUS | Status: AC | PRN
  Administered 2021-09-27: 500 [IU]

## 2021-09-27 MED ORDER — SODIUM CHLORIDE 0.9 % IV SOLN: Freq: Once | INTRAVENOUS | Status: AC

## 2021-09-27 MED ORDER — DIPHENHYDRAMINE HCL 50 MG/ML IJ SOLN
50.0000 mg | Freq: Once | INTRAMUSCULAR | Status: AC
  Administered 2021-09-27: 50 mg via INTRAVENOUS
  Filled 2021-09-27: qty 1

## 2021-09-27 MED ORDER — SODIUM CHLORIDE 0.9% FLUSH
10.0000 mL | Freq: Once | INTRAVENOUS | Status: AC
  Administered 2021-09-27: 10 mL

## 2021-09-27 MED ORDER — SODIUM CHLORIDE 0.9 % IV SOLN
45.0000 mg/m2 | Freq: Once | INTRAVENOUS | Status: AC
  Administered 2021-09-27: 84 mg via INTRAVENOUS
  Filled 2021-09-27: qty 14

## 2021-09-27 MED ORDER — SODIUM CHLORIDE 0.9 % IV SOLN
169.8000 mg | Freq: Once | INTRAVENOUS | Status: AC
  Administered 2021-09-27: 170 mg via INTRAVENOUS
  Filled 2021-09-27: qty 17

## 2021-09-27 MED ORDER — FAMOTIDINE IN NACL 20-0.9 MG/50ML-% IV SOLN
20.0000 mg | Freq: Once | INTRAVENOUS | Status: AC
  Administered 2021-09-27: 20 mg via INTRAVENOUS
  Filled 2021-09-27: qty 50

## 2021-09-27 NOTE — Patient Instructions (Signed)
[facility]  Discharge Instructions: Thank you for choosing [facility] to provide your oncology and hematology care.   If you have a lab appointment with the [facility], please go directly to the [facility] and check in at the registration area.   Wear comfortable clothing and clothing appropriate for easy access to any Portacath or PICC line.   We strive to give you quality time with your provider. You may need to reschedule your appointment if you arrive late (15 or more minutes).  Arriving late affects you and other patients whose appointments are after yours.  Also, if you miss three or more appointments without notifying the office, you may be dismissed from the clinic at the provider's discretion.      For prescription refill requests, have your pharmacy contact our office and allow 72 hours for refills to be completed.    Today you received the following chemotherapy and/or immunotherapy agents : Taxol, Carboplatin      To help prevent nausea and vomiting after your treatment, we encourage you to take your nausea medication as directed.  BELOW ARE SYMPTOMS THAT SHOULD BE REPORTED IMMEDIATELY: *FEVER GREATER THAN 100.4 F (38 C) OR HIGHER *CHILLS OR SWEATING *NAUSEA AND VOMITING THAT IS NOT CONTROLLED WITH YOUR NAUSEA MEDICATION *UNUSUAL SHORTNESS OF BREATH *UNUSUAL BRUISING OR BLEEDING *URINARY PROBLEMS (pain or burning when urinating, or frequent urination) *BOWEL PROBLEMS (unusual diarrhea, constipation, pain near the anus) TENDERNESS IN MOUTH AND THROAT WITH OR WITHOUT PRESENCE OF ULCERS (sore throat, sores in mouth, or a toothache) UNUSUAL RASH, SWELLING OR PAIN  UNUSUAL VAGINAL DISCHARGE OR ITCHING   Items with * indicate a potential emergency and should be followed up as soon as possible or go to the Emergency Department if any problems should occur.  Please show the CHEMOTHERAPY ALERT CARD or IMMUNOTHERAPY ALERT CARD at  check-in to the Emergency Department and triage nurse.  Should you have questions after your visit or need to cancel or reschedule your appointment, please contact [facility]  Dept: [phone]  and follow the prompts.  Office hours are 8:00 a.m. to 4:30 p.m. Monday - Friday. Please note that voicemails left after 4:00 p.m. may not be returned until the following business day.  We are closed weekends and major holidays. You have access to a nurse at all times for urgent questions. Please call the main number to the clinic Dept: [phone] and follow the prompts.   For any non-urgent questions, you may also contact your provider using MyChart. We now offer e-Visits for anyone 85 and older to request care online for non-urgent symptoms. For details visit [email or web address].   Also download the MyChart app! Go to the app store, search "MyChart", open the app, select [facility], and log in with your MyChart username and password.  Masks are optional in the cancer centers. If you would like for your care team to wear a mask while they are taking care of you, please let them know. You may have one support person who is at least 76 years old accompany you for your appointments.

## 2021-09-28 ENCOUNTER — Other Ambulatory Visit: Payer: Self-pay

## 2021-09-28 ENCOUNTER — Ambulatory Visit
Admission: RE | Admit: 2021-09-28 | Discharge: 2021-09-28 | Disposition: A | Discharge status: Home / Self Care | Payer: Medicare Other | Source: Ambulatory Visit | Attending: Radiation Oncology | Admitting: Radiation Oncology

## 2021-09-28 ENCOUNTER — Other Ambulatory Visit (HOSPITAL_COMMUNITY): Payer: Self-pay

## 2021-09-28 DIAGNOSIS — Z171 Estrogen receptor negative status [ER-]: Secondary | ICD-10-CM | POA: Diagnosis not present

## 2021-09-28 DIAGNOSIS — C50211 Malignant neoplasm of upper-inner quadrant of right female breast: Secondary | ICD-10-CM | POA: Diagnosis not present

## 2021-09-28 DIAGNOSIS — Z510 Encounter for antineoplastic radiation therapy: Secondary | ICD-10-CM | POA: Diagnosis not present

## 2021-09-28 DIAGNOSIS — C3412 Malignant neoplasm of upper lobe, left bronchus or lung: Secondary | ICD-10-CM | POA: Diagnosis not present

## 2021-09-28 DIAGNOSIS — C7931 Secondary malignant neoplasm of brain: Secondary | ICD-10-CM | POA: Diagnosis not present

## 2021-09-28 DIAGNOSIS — Z5111 Encounter for antineoplastic chemotherapy: Secondary | ICD-10-CM | POA: Diagnosis not present

## 2021-09-28 DIAGNOSIS — Z79899 Other long term (current) drug therapy: Secondary | ICD-10-CM | POA: Diagnosis not present

## 2021-09-28 LAB — RAD ONC ARIA SESSION SUMMARY
Course Elapsed Days: 43
Plan Fractions Treated to Date: 20
Plan Prescribed Dose Per Fraction: 2 Gy
Plan Total Fractions Prescribed: 30
Plan Total Prescribed Dose: 60 Gy
Reference Point Dosage Given to Date: 40 Gy
Reference Point Session Dosage Given: 2 Gy
Session Number: 25

## 2021-09-29 ENCOUNTER — Ambulatory Visit
Admission: RE | Admit: 2021-09-29 | Discharge: 2021-09-29 | Disposition: A | Discharge status: Home / Self Care | Payer: Medicare Other | Source: Ambulatory Visit | Attending: Radiation Oncology | Admitting: Radiation Oncology

## 2021-09-29 ENCOUNTER — Other Ambulatory Visit: Payer: Self-pay

## 2021-09-29 ENCOUNTER — Other Ambulatory Visit (HOSPITAL_COMMUNITY): Payer: Self-pay

## 2021-09-29 DIAGNOSIS — Z853 Personal history of malignant neoplasm of breast: Secondary | ICD-10-CM | POA: Diagnosis not present

## 2021-09-29 DIAGNOSIS — I10 Essential (primary) hypertension: Secondary | ICD-10-CM | POA: Diagnosis not present

## 2021-09-29 DIAGNOSIS — Z171 Estrogen receptor negative status [ER-]: Secondary | ICD-10-CM | POA: Diagnosis not present

## 2021-09-29 DIAGNOSIS — Z510 Encounter for antineoplastic radiation therapy: Secondary | ICD-10-CM | POA: Diagnosis not present

## 2021-09-29 DIAGNOSIS — Z5111 Encounter for antineoplastic chemotherapy: Secondary | ICD-10-CM | POA: Diagnosis not present

## 2021-09-29 DIAGNOSIS — C7931 Secondary malignant neoplasm of brain: Secondary | ICD-10-CM | POA: Diagnosis not present

## 2021-09-29 DIAGNOSIS — Z791 Long term (current) use of non-steroidal anti-inflammatories (NSAID): Secondary | ICD-10-CM | POA: Diagnosis not present

## 2021-09-29 DIAGNOSIS — C50211 Malignant neoplasm of upper-inner quadrant of right female breast: Secondary | ICD-10-CM | POA: Diagnosis not present

## 2021-09-29 DIAGNOSIS — G9389 Other specified disorders of brain: Secondary | ICD-10-CM | POA: Diagnosis not present

## 2021-09-29 DIAGNOSIS — Z9181 History of falling: Secondary | ICD-10-CM | POA: Diagnosis not present

## 2021-09-29 DIAGNOSIS — Z9012 Acquired absence of left breast and nipple: Secondary | ICD-10-CM | POA: Diagnosis not present

## 2021-09-29 DIAGNOSIS — C3412 Malignant neoplasm of upper lobe, left bronchus or lung: Secondary | ICD-10-CM | POA: Diagnosis not present

## 2021-09-29 DIAGNOSIS — D509 Iron deficiency anemia, unspecified: Secondary | ICD-10-CM | POA: Diagnosis not present

## 2021-09-29 DIAGNOSIS — Z79899 Other long term (current) drug therapy: Secondary | ICD-10-CM | POA: Diagnosis not present

## 2021-09-29 DIAGNOSIS — M8580 Other specified disorders of bone density and structure, unspecified site: Secondary | ICD-10-CM | POA: Diagnosis not present

## 2021-09-29 DIAGNOSIS — M1990 Unspecified osteoarthritis, unspecified site: Secondary | ICD-10-CM | POA: Diagnosis not present

## 2021-09-29 DIAGNOSIS — E782 Mixed hyperlipidemia: Secondary | ICD-10-CM | POA: Diagnosis not present

## 2021-09-29 LAB — RAD ONC ARIA SESSION SUMMARY
Course Elapsed Days: 44
Plan Fractions Treated to Date: 21
Plan Prescribed Dose Per Fraction: 2 Gy
Plan Total Fractions Prescribed: 30
Plan Total Prescribed Dose: 60 Gy
Reference Point Dosage Given to Date: 42 Gy
Reference Point Session Dosage Given: 2 Gy
Session Number: 26

## 2021-09-29 NOTE — Telephone Encounter (Signed)
NA  No call back  Up coming appointment with PCP  This encounter will be closed

## 2021-09-30 ENCOUNTER — Ambulatory Visit
Admission: RE | Admit: 2021-09-30 | Discharge: 2021-09-30 | Disposition: A | Discharge status: Home / Self Care | Payer: Medicare Other | Source: Ambulatory Visit | Attending: Radiation Oncology | Admitting: Radiation Oncology

## 2021-09-30 ENCOUNTER — Other Ambulatory Visit: Payer: Self-pay

## 2021-09-30 DIAGNOSIS — Z9012 Acquired absence of left breast and nipple: Secondary | ICD-10-CM | POA: Diagnosis not present

## 2021-09-30 DIAGNOSIS — C7931 Secondary malignant neoplasm of brain: Secondary | ICD-10-CM | POA: Diagnosis not present

## 2021-09-30 DIAGNOSIS — Z5111 Encounter for antineoplastic chemotherapy: Secondary | ICD-10-CM | POA: Diagnosis not present

## 2021-09-30 DIAGNOSIS — Z79899 Other long term (current) drug therapy: Secondary | ICD-10-CM | POA: Diagnosis not present

## 2021-09-30 DIAGNOSIS — G9389 Other specified disorders of brain: Secondary | ICD-10-CM | POA: Diagnosis not present

## 2021-09-30 DIAGNOSIS — M1990 Unspecified osteoarthritis, unspecified site: Secondary | ICD-10-CM | POA: Diagnosis not present

## 2021-09-30 DIAGNOSIS — Z510 Encounter for antineoplastic radiation therapy: Secondary | ICD-10-CM | POA: Diagnosis not present

## 2021-09-30 DIAGNOSIS — Z791 Long term (current) use of non-steroidal anti-inflammatories (NSAID): Secondary | ICD-10-CM | POA: Diagnosis not present

## 2021-09-30 DIAGNOSIS — D509 Iron deficiency anemia, unspecified: Secondary | ICD-10-CM | POA: Diagnosis not present

## 2021-09-30 DIAGNOSIS — I10 Essential (primary) hypertension: Secondary | ICD-10-CM | POA: Diagnosis not present

## 2021-09-30 DIAGNOSIS — C50211 Malignant neoplasm of upper-inner quadrant of right female breast: Secondary | ICD-10-CM | POA: Diagnosis not present

## 2021-09-30 DIAGNOSIS — C3412 Malignant neoplasm of upper lobe, left bronchus or lung: Secondary | ICD-10-CM | POA: Diagnosis not present

## 2021-09-30 DIAGNOSIS — Z853 Personal history of malignant neoplasm of breast: Secondary | ICD-10-CM | POA: Diagnosis not present

## 2021-09-30 DIAGNOSIS — E782 Mixed hyperlipidemia: Secondary | ICD-10-CM | POA: Diagnosis not present

## 2021-09-30 DIAGNOSIS — Z171 Estrogen receptor negative status [ER-]: Secondary | ICD-10-CM | POA: Diagnosis not present

## 2021-09-30 DIAGNOSIS — M8580 Other specified disorders of bone density and structure, unspecified site: Secondary | ICD-10-CM | POA: Diagnosis not present

## 2021-09-30 DIAGNOSIS — Z9181 History of falling: Secondary | ICD-10-CM | POA: Diagnosis not present

## 2021-09-30 LAB — RAD ONC ARIA SESSION SUMMARY
Course Elapsed Days: 45
Plan Fractions Treated to Date: 22
Plan Prescribed Dose Per Fraction: 2 Gy
Plan Total Fractions Prescribed: 30
Plan Total Prescribed Dose: 60 Gy
Reference Point Dosage Given to Date: 44 Gy
Reference Point Session Dosage Given: 2 Gy
Session Number: 27

## 2021-10-01 ENCOUNTER — Other Ambulatory Visit: Payer: Self-pay

## 2021-10-01 ENCOUNTER — Ambulatory Visit
Admission: RE | Admit: 2021-10-01 | Discharge: 2021-10-01 | Disposition: A | Discharge status: Home / Self Care | Payer: Medicare Other | Source: Ambulatory Visit | Attending: Radiation Oncology | Admitting: Radiation Oncology

## 2021-10-01 DIAGNOSIS — C50211 Malignant neoplasm of upper-inner quadrant of right female breast: Secondary | ICD-10-CM | POA: Diagnosis not present

## 2021-10-01 DIAGNOSIS — Z510 Encounter for antineoplastic radiation therapy: Secondary | ICD-10-CM | POA: Diagnosis not present

## 2021-10-01 DIAGNOSIS — R918 Other nonspecific abnormal finding of lung field: Secondary | ICD-10-CM | POA: Diagnosis not present

## 2021-10-01 DIAGNOSIS — R5381 Other malaise: Secondary | ICD-10-CM | POA: Diagnosis not present

## 2021-10-01 DIAGNOSIS — G9389 Other specified disorders of brain: Secondary | ICD-10-CM | POA: Diagnosis not present

## 2021-10-01 DIAGNOSIS — Z5111 Encounter for antineoplastic chemotherapy: Secondary | ICD-10-CM | POA: Diagnosis not present

## 2021-10-01 DIAGNOSIS — C50919 Malignant neoplasm of unspecified site of unspecified female breast: Secondary | ICD-10-CM | POA: Diagnosis not present

## 2021-10-01 DIAGNOSIS — C3412 Malignant neoplasm of upper lobe, left bronchus or lung: Secondary | ICD-10-CM | POA: Diagnosis not present

## 2021-10-01 DIAGNOSIS — Z171 Estrogen receptor negative status [ER-]: Secondary | ICD-10-CM | POA: Diagnosis not present

## 2021-10-01 DIAGNOSIS — C7931 Secondary malignant neoplasm of brain: Secondary | ICD-10-CM | POA: Diagnosis not present

## 2021-10-01 DIAGNOSIS — Z79899 Other long term (current) drug therapy: Secondary | ICD-10-CM | POA: Diagnosis not present

## 2021-10-01 LAB — RAD ONC ARIA SESSION SUMMARY
Course Elapsed Days: 46
Plan Fractions Treated to Date: 23
Plan Prescribed Dose Per Fraction: 2 Gy
Plan Total Fractions Prescribed: 30
Plan Total Prescribed Dose: 60 Gy
Reference Point Dosage Given to Date: 46 Gy
Reference Point Session Dosage Given: 2 Gy
Session Number: 28

## 2021-10-01 MED FILL — Dexamethasone Sodium Phosphate Inj 100 MG/10ML: INTRAMUSCULAR | Qty: 1 | Status: AC

## 2021-10-04 ENCOUNTER — Ambulatory Visit
Admission: RE | Admit: 2021-10-04 | Discharge: 2021-10-04 | Disposition: A | Discharge status: Home / Self Care | Payer: Medicare Other | Source: Ambulatory Visit | Attending: Radiation Oncology | Admitting: Radiation Oncology

## 2021-10-04 ENCOUNTER — Inpatient Hospital Stay: Payer: Medicare Other

## 2021-10-04 ENCOUNTER — Other Ambulatory Visit: Payer: Self-pay

## 2021-10-04 ENCOUNTER — Encounter: Payer: Self-pay | Admitting: Hematology and Oncology

## 2021-10-04 ENCOUNTER — Inpatient Hospital Stay (HOSPITAL_BASED_OUTPATIENT_CLINIC_OR_DEPARTMENT_OTHER): Payer: Medicare Other | Admitting: Hematology and Oncology

## 2021-10-04 DIAGNOSIS — C50919 Malignant neoplasm of unspecified site of unspecified female breast: Secondary | ICD-10-CM

## 2021-10-04 DIAGNOSIS — C7931 Secondary malignant neoplasm of brain: Secondary | ICD-10-CM | POA: Diagnosis not present

## 2021-10-04 DIAGNOSIS — C50211 Malignant neoplasm of upper-inner quadrant of right female breast: Secondary | ICD-10-CM | POA: Diagnosis not present

## 2021-10-04 DIAGNOSIS — R739 Hyperglycemia, unspecified: Secondary | ICD-10-CM

## 2021-10-04 DIAGNOSIS — C3412 Malignant neoplasm of upper lobe, left bronchus or lung: Secondary | ICD-10-CM | POA: Diagnosis not present

## 2021-10-04 DIAGNOSIS — Z79899 Other long term (current) drug therapy: Secondary | ICD-10-CM | POA: Diagnosis not present

## 2021-10-04 DIAGNOSIS — Z5111 Encounter for antineoplastic chemotherapy: Secondary | ICD-10-CM | POA: Diagnosis not present

## 2021-10-04 DIAGNOSIS — Z171 Estrogen receptor negative status [ER-]: Secondary | ICD-10-CM | POA: Diagnosis not present

## 2021-10-04 DIAGNOSIS — C3490 Malignant neoplasm of unspecified part of unspecified bronchus or lung: Secondary | ICD-10-CM

## 2021-10-04 DIAGNOSIS — Z510 Encounter for antineoplastic radiation therapy: Secondary | ICD-10-CM | POA: Diagnosis not present

## 2021-10-04 DIAGNOSIS — Z95828 Presence of other vascular implants and grafts: Secondary | ICD-10-CM

## 2021-10-04 LAB — CMP (CANCER CENTER ONLY)
ALT: 17 U/L (ref 0–44)
AST: 12 U/L — ABNORMAL LOW (ref 15–41)
Albumin: 3.1 g/dL — ABNORMAL LOW (ref 3.5–5.0)
Alkaline Phosphatase: 54 U/L (ref 38–126)
Anion gap: 7 (ref 5–15)
BUN: 12 mg/dL (ref 8–23)
CO2: 25 mmol/L (ref 22–32)
Calcium: 9.1 mg/dL (ref 8.9–10.3)
Chloride: 103 mmol/L (ref 98–111)
Creatinine: 0.46 mg/dL (ref 0.44–1.00)
GFR, Estimated: >60 mL/min (ref >60)
Glucose, Bld: 130 mg/dL — ABNORMAL HIGH (ref 70–99)
Potassium: 3.5 mmol/L (ref 3.5–5.1)
Sodium: 135 mmol/L (ref 135–145)
Total Bilirubin: 0.4 mg/dL (ref 0.3–1.2)
Total Protein: 5.9 g/dL — ABNORMAL LOW (ref 6.5–8.1)

## 2021-10-04 LAB — CBC WITH DIFFERENTIAL (CANCER CENTER ONLY)
Abs Immature Granulocytes: 0.02 10*3/uL (ref 0.00–0.07)
Basophils Absolute: 0 10*3/uL (ref 0.0–0.1)
Basophils Relative: 1 %
Eosinophils Absolute: 0.1 10*3/uL (ref 0.0–0.5)
Eosinophils Relative: 4 %
HCT: 30.2 % — ABNORMAL LOW (ref 36.0–46.0)
Hemoglobin: 9.8 g/dL — ABNORMAL LOW (ref 12.0–15.0)
Immature Granulocytes: 1 %
Lymphocytes Relative: 5 %
Lymphs Abs: 0.1 10*3/uL — ABNORMAL LOW (ref 0.7–4.0)
MCH: 30.2 pg (ref 26.0–34.0)
MCHC: 32.5 g/dL (ref 30.0–36.0)
MCV: 92.9 fL (ref 80.0–100.0)
Monocytes Absolute: 0.5 10*3/uL (ref 0.1–1.0)
Monocytes Relative: 16 %
Neutro Abs: 2.2 10*3/uL (ref 1.7–7.7)
Neutrophils Relative %: 73 %
Platelet Count: 354 10*3/uL (ref 150–400)
RBC: 3.25 MIL/uL — ABNORMAL LOW (ref 3.87–5.11)
RDW: 16.1 % — ABNORMAL HIGH (ref 11.5–15.5)
WBC Count: 3 10*3/uL — ABNORMAL LOW (ref 4.0–10.5)
nRBC: 1 % — ABNORMAL HIGH (ref 0.0–0.2)

## 2021-10-04 LAB — RAD ONC ARIA SESSION SUMMARY
Course Elapsed Days: 49
Plan Fractions Treated to Date: 24
Plan Prescribed Dose Per Fraction: 2 Gy
Plan Total Fractions Prescribed: 30
Plan Total Prescribed Dose: 60 Gy
Reference Point Dosage Given to Date: 48 Gy
Reference Point Session Dosage Given: 2 Gy
Session Number: 29

## 2021-10-04 MED ORDER — SODIUM CHLORIDE 0.9 % IV SOLN
45.0000 mg/m2 | Freq: Once | INTRAVENOUS | Status: AC
  Administered 2021-10-04: 84 mg via INTRAVENOUS
  Filled 2021-10-04: qty 14

## 2021-10-04 MED ORDER — HEPARIN SOD (PORK) LOCK FLUSH 100 UNIT/ML IV SOLN
500.0000 [IU] | Freq: Once | INTRAVENOUS | Status: AC | PRN
  Administered 2021-10-04: 500 [IU]

## 2021-10-04 MED ORDER — SODIUM CHLORIDE 0.9% FLUSH
10.0000 mL | Freq: Once | INTRAVENOUS | Status: AC
  Administered 2021-10-04: 10 mL

## 2021-10-04 MED ORDER — FAMOTIDINE IN NACL 20-0.9 MG/50ML-% IV SOLN
20.0000 mg | Freq: Once | INTRAVENOUS | Status: AC
  Administered 2021-10-04: 20 mg via INTRAVENOUS
  Filled 2021-10-04: qty 50

## 2021-10-04 MED ORDER — SODIUM CHLORIDE 0.9% FLUSH
10.0000 mL | INTRAVENOUS | Status: DC | PRN
  Administered 2021-10-04: 10 mL

## 2021-10-04 MED ORDER — CETIRIZINE HCL 10 MG/ML IV SOLN
10.0000 mg | Freq: Once | INTRAVENOUS | Status: AC
  Administered 2021-10-04: 10 mg via INTRAVENOUS
  Filled 2021-10-04: qty 1

## 2021-10-04 MED ORDER — SODIUM CHLORIDE 0.9 % IV SOLN
169.8000 mg | Freq: Once | INTRAVENOUS | Status: AC
  Administered 2021-10-04: 170 mg via INTRAVENOUS
  Filled 2021-10-04: qty 17

## 2021-10-04 MED ORDER — SODIUM CHLORIDE 0.9 % IV SOLN
10.0000 mg | Freq: Once | INTRAVENOUS | Status: AC
  Administered 2021-10-04: 10 mg via INTRAVENOUS
  Filled 2021-10-04: qty 10

## 2021-10-04 MED ORDER — SODIUM CHLORIDE 0.9 % IV SOLN: Freq: Once | INTRAVENOUS | Status: AC

## 2021-10-04 MED ORDER — PALONOSETRON HCL INJECTION 0.25 MG/5ML
0.2500 mg | Freq: Once | INTRAVENOUS | Status: AC
  Administered 2021-10-04: 0.25 mg via INTRAVENOUS
  Filled 2021-10-04: qty 5

## 2021-10-04 NOTE — Assessment & Plan Note (Signed)
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  She also appears to have had some regional adenopathy including prevascular, AP window and left hilar adenopathy.  She has multiple intracranial metastatic lesions which are likely secondary to metastatic lung cancer given presence of regional adenopathy.  She is now status post SRS to the brain lesions.  She started concurrent chemoradiation with CarboTaxol on August 30, 2021.  She is tolerating chemotherapy very well so far. We will plan to repeat imaging after completion of definitive concurrent chemoradiation. 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

## 2021-10-04 NOTE — Assessment & Plan Note (Signed)
She is currently not on any dexamethasone.  She reports some headaches which are not significantly worse but definitely bothersome.  I have encouraged her to keep [name] posted if they continue to get worse.  No worsening neurological deficit.

## 2021-10-04 NOTE — Progress Notes (Signed)
[facility] Follow up:    Jennifer Cowan, [place] [address]  DIAGNOSIS: lung cancer  SUMMARY OF ONCOLOGIC HISTORY: Oncology History  Malignant neoplasm of unspecified part of unspecified bronchus or lung ([place])  08/13/2021 Initial Diagnosis   Malignant neoplasm of unspecified part of unspecified bronchus or lung ([place])   08/30/2021 - 09/13/2021 Chemotherapy   Patient is on Treatment Plan : LUNG Carboplatin / Paclitaxel + XRT q7d     08/30/2021 -  Chemotherapy   Patient is on Treatment Plan : LUNG Carboplatin + Paclitaxel + XRT q7d       CURRENT THERAPY: chemoradiation  INTERVAL HISTORY:  Jennifer Cowan 76 y.o. female returns for f/u. Jennifer Cowan is here with her sister.  Jennifer Cowan is doing quite well overall with the chemoradiation.  Jennifer Cowan reports some headaches which are not significantly worse from baseline.  Jennifer Cowan otherwise also has noted significant decrease in the size of her right breast mass.  No neuropathy reported.  Jennifer Cowan is able to walk around at home and do some small chores.  No change in bowel or urinary habits.  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  MEDICAL HISTORY: Past Medical History:  Diagnosis Date   Anemia     Arthritis    Breast cancer ([place])    Cancer ([place]) 1999   Breast Cancer   Hyperlipidemia    Hypertension    Osteopenia 01/12/2021    SURGICAL HISTORY: Past Surgical History:  Procedure Laterality Date   BREAST SURGERY  1999   Masectomy- left   BRONCHIAL BIOPSY  07/30/2021   Procedure: BRONCHIAL BIOPSIES;  Surgeon: Collene Gobble, MD;  Location: [facility] ENDOSCOPY;  Service: Pulmonary;;   BRONCHIAL BRUSHINGS  07/30/2021   Procedure: BRONCHIAL BRUSHINGS;  Surgeon: Collene Gobble, MD;  Location: [facility] ENDOSCOPY;  Service: Pulmonary;;   BRONCHIAL NEEDLE ASPIRATION BIOPSY  07/30/2021   Procedure: BRONCHIAL NEEDLE ASPIRATION BIOPSIES;  Surgeon: Collene Gobble, MD;  Location: [facility] ENDOSCOPY;  Service: Pulmonary;;   CYSTECTOMY     cyst removed off top of head   IR IMAGING GUIDED PORT INSERTION  08/20/2021   MASTECTOMY     VIDEO BRONCHOSCOPY WITH RADIAL ENDOBRONCHIAL ULTRASOUND  07/30/2021   Procedure: VIDEO BRONCHOSCOPY WITH RADIAL ENDOBRONCHIAL ULTRASOUND;  Surgeon: Collene Gobble, MD;  Location: [facility] ENDOSCOPY;  Service: Pulmonary;;    SOCIAL HISTORY: Social History   Socioeconomic History   Marital status: Single    Spouse name: Not on file   Number of children: 0   Years of education: 12   Highest education level: High school graduate  Occupational History   Occupation: retired  Tobacco Use   Smoking status: Never   Smokeless tobacco: Never  Vaping Use  Vaping Use: Never used  Substance and Sexual Activity   Alcohol use: No   Drug use: Never   Sexual activity: Yes    Birth control/protection: Post-menopausal  Other Topics Concern   Not on file  Social History Narrative   Not on file   Social Determinants of Health   Financial Resource Strain: Not on file  Food Insecurity: Not on file  Transportation Needs: Not on file  Physical Activity: Not on file  Stress: Not on file  Social Connections: Not on file  Intimate Partner Violence: Not on file    FAMILY HISTORY: Family  History  Problem Relation Age of Onset   Arthritis Mother    Breast cancer Sister    Cancer Sister        Breast Cancer   Diabetes Sister    Hyperlipidemia Sister    Hypertension Sister    Stroke Sister    Diabetes Brother    Hypertension Brother     Review of Systems  Constitutional:  Positive for fatigue. Negative for appetite change, chills, fever and unexpected weight change.  HENT:   Negative for hearing loss, lump/mass and trouble swallowing.   Eyes:  Negative for eye problems and icterus.  Respiratory:  Negative for chest tightness, cough and shortness of breath.   Cardiovascular:  Negative for chest pain, leg swelling and palpitations.  Gastrointestinal:  Negative for abdominal distention, abdominal pain, constipation, diarrhea, nausea and vomiting.  Endocrine: Negative for hot flashes.  Genitourinary:  Negative for difficulty urinating.   Musculoskeletal:  Negative for arthralgias.  Skin:  Negative for itching and rash.  Neurological:  Negative for dizziness, extremity weakness, headaches and numbness.  Hematological:  Negative for adenopathy. Does not bruise/bleed easily.  Psychiatric/Behavioral:  Negative for depression. The patient is not nervous/anxious.       PHYSICAL EXAMINATION  ECOG PERFORMANCE STATUS: 2 - Symptomatic, <50% confined to bed  Vitals:   10/04/21 1149  BP: (!) 150/57  Pulse: (!) 110  Resp: 18  Temp: 97.7 F (36.5 C)  SpO2: 95%    Physical Exam Constitutional:      General: Jennifer Cowan is not in acute distress.    Appearance: Normal appearance. Jennifer Cowan is not toxic-appearing.     Comments: Jennifer Cowan appears much more interactive today  HENT:     Head: Normocephalic and atraumatic.  Eyes:     General: No scleral icterus. Cardiovascular:     Rate and Rhythm: Normal rate and regular rhythm.     Pulses: Normal pulses.     Heart sounds: Normal heart sounds.  Pulmonary:     Effort: Pulmonary effort is normal.     Breath sounds: Normal breath sounds.   Chest:     Comments: Right breast mass appears to be smaller on exam today. Abdominal:     General: Abdomen is flat. Bowel sounds are normal. There is no distension.     Palpations: Abdomen is soft.     Tenderness: There is no abdominal tenderness.  Musculoskeletal:        General: No swelling.     Cervical back: Neck supple.  Lymphadenopathy:     Cervical: No cervical adenopathy.  Skin:    General: Skin is warm and dry.     Findings: No rash.  Neurological:     Mental Status: Jennifer Cowan is alert and oriented to person, place, and time.  Psychiatric:        Mood and Affect: Mood normal.  Behavior: Behavior normal.     LABORATORY DATA:  CBC    Component Value Date/Time   WBC 3.0 (L) 10/04/2021 1108   WBC 6.7 07/25/2021 0111   RBC 3.25 (L) 10/04/2021 1108   HGB 9.8 (L) 10/04/2021 1108   HGB 15.7 07/07/2021 1538   HCT 30.2 (L) 10/04/2021 1108   HCT 46.6 07/07/2021 1538   PLT 354 10/04/2021 1108   PLT 450 07/07/2021 1538   MCV 92.9 10/04/2021 1108   MCV 88 07/07/2021 1538   MCH 30.2 10/04/2021 1108   MCHC 32.5 10/04/2021 1108   RDW 16.1 (H) 10/04/2021 1108   RDW 12.9 07/07/2021 1538   LYMPHSABS 0.1 (L) 10/04/2021 1108   LYMPHSABS 1.6 07/07/2021 1538   MONOABS 0.5 10/04/2021 1108   EOSABS 0.1 10/04/2021 1108   EOSABS 0.1 07/07/2021 1538   BASOSABS 0.0 10/04/2021 1108   BASOSABS 0.0 07/07/2021 1538    CMP     Component Value Date/Time   NA 135 10/04/2021 1108   NA 141 07/07/2021 1538   K 3.5 10/04/2021 1108   CL 103 10/04/2021 1108   CO2 25 10/04/2021 1108   GLUCOSE 130 (H) 10/04/2021 1108   BUN 12 10/04/2021 1108   BUN 9 07/07/2021 1538   CREATININE 0.46 10/04/2021 1108   CALCIUM 9.1 10/04/2021 1108   PROT 5.9 (L) 10/04/2021 1108   PROT 7.9 07/07/2021 1538   ALBUMIN 3.1 (L) 10/04/2021 1108   ALBUMIN 4.6 07/07/2021 1538   AST 12 (L) 10/04/2021 1108   ALT 17 10/04/2021 1108   ALKPHOS 54 10/04/2021 1108   BILITOT 0.4 10/04/2021 1108   GFRNONAA >60  10/04/2021 1108   GFRAA 103 03/05/2020 1119     ASSESSMENT and THERAPY PLAN:   Malignant neoplasm of unspecified part of unspecified bronchus or lung ([place]) This is a 76 year old patient with 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  Jennifer Cowan also appears to have had some regional adenopathy including prevascular, AP window and left hilar adenopathy.  Jennifer Cowan has multiple intracranial metastatic lesions which are likely secondary to metastatic lung cancer given presence of regional adenopathy.  Jennifer Cowan is now status post SRS to the brain lesions.  Jennifer Cowan started concurrent chemoradiation with CarboTaxol on August 30, 2021.  Jennifer Cowan is tolerating chemotherapy very well so far. We will plan to repeat imaging after completion of definitive concurrent chemoradiation. 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  We will continue to monitor this breast mass.  Jennifer Cowan will also receive adjuvant immunotherapy for her lung cancer which once again may have activity in triple negative biology.  Metastasis to brain [facility]) Jennifer Cowan is currently not on any dexamethasone.  Jennifer Cowan reports some headaches which are not significantly worse but definitely bothersome.  I have encouraged her to keep [name] posted if they continue to get worse.  No worsening neurological deficit.  Total encounter time:30 minutes*in face-to-face visit time, chart review, lab review, care coordination, order entry, and documentation of the encounter time.  *Total Encounter Time as defined by the Centers for Medicare and Medicaid Services includes, in addition to the face-to-face time of a patient visit (documented in the note above) non-face-to-face time: obtaining and reviewing outside  history, ordering and reviewing medications, tests or procedures, care coordination (communications with  other health care professionals or caregivers) and documentation in the medical record.

## 2021-10-04 NOTE — Patient Instructions (Signed)
[facility] ONCOLOGY  Discharge Instructions: Thank you for choosing [facility] to provide your oncology and hematology care.   If you have a lab appointment with the [facility], please go directly to the [facility] and check in at the registration area.   Wear comfortable clothing and clothing appropriate for easy access to any Portacath or PICC line.   We strive to give you quality time with your provider. You may need to reschedule your appointment if you arrive late (15 or more minutes).  Arriving late affects you and other patients whose appointments are after yours.  Also, if you miss three or more appointments without notifying the office, you may be dismissed from the clinic at the provider's discretion.      For prescription refill requests, have your pharmacy contact our office and allow 72 hours for refills to be completed.    Today you received the following chemotherapy and/or immunotherapy agents: carboplatin, paclitaxel      To help prevent nausea and vomiting after your treatment, we encourage you to take your nausea medication as directed.  BELOW ARE SYMPTOMS THAT SHOULD BE REPORTED IMMEDIATELY: *FEVER GREATER THAN 100.4 F (38 C) OR HIGHER *CHILLS OR SWEATING *NAUSEA AND VOMITING THAT IS NOT CONTROLLED WITH YOUR NAUSEA MEDICATION *UNUSUAL SHORTNESS OF BREATH *UNUSUAL BRUISING OR BLEEDING *URINARY PROBLEMS (pain or burning when urinating, or frequent urination) *BOWEL PROBLEMS (unusual diarrhea, constipation, pain near the anus) TENDERNESS IN MOUTH AND THROAT WITH OR WITHOUT PRESENCE OF ULCERS (sore throat, sores in mouth, or a toothache) UNUSUAL RASH, SWELLING OR PAIN  UNUSUAL VAGINAL DISCHARGE OR ITCHING   Items with * indicate a potential emergency and should be followed up as soon as possible or go to the Emergency Department if any problems should occur.  Please show the CHEMOTHERAPY ALERT CARD or IMMUNOTHERAPY ALERT CARD  at check-in to the Emergency Department and triage nurse.  Should you have questions after your visit or need to cancel or reschedule your appointment, please contact [facility]  Dept: [phone]  and follow the prompts.  Office hours are 8:00 a.m. to 4:30 p.m. Monday - Friday. Please note that voicemails left after 4:00 p.m. may not be returned until the following business day.  We are closed weekends and major holidays. You have access to a nurse at all times for urgent questions. Please call the main number to the clinic Dept: [phone] and follow the prompts.   For any non-urgent questions, you may also contact your provider using MyChart. We now offer e-Visits for anyone 57 and older to request care online for non-urgent symptoms. For details visit [email or web address].   Also download the MyChart app! Go to the app store, search "MyChart", open the app, select [facility], and log in with your MyChart username and password.  Masks are optional in the cancer centers. If you would like for your care team to wear a mask while they are taking care of you, please let them know. You may have one support person who is at least 76 years old accompany you for your appointments.

## 2021-10-04 NOTE — Assessment & Plan Note (Signed)
She continues to have response in the right breast mass with neoadjuvant chemoradiation for lung cancer.  We will continue to monitor this breast mass.  She will also receive adjuvant immunotherapy for her lung cancer which once again may have activity in triple negative biology.

## 2021-10-05 ENCOUNTER — Ambulatory Visit
Admission: RE | Admit: 2021-10-05 | Discharge: 2021-10-05 | Disposition: A | Discharge status: Home / Self Care | Payer: Medicare Other | Source: Ambulatory Visit | Attending: Radiation Oncology | Admitting: Radiation Oncology

## 2021-10-05 ENCOUNTER — Other Ambulatory Visit: Payer: Self-pay

## 2021-10-05 DIAGNOSIS — C3412 Malignant neoplasm of upper lobe, left bronchus or lung: Secondary | ICD-10-CM | POA: Diagnosis not present

## 2021-10-05 DIAGNOSIS — C7931 Secondary malignant neoplasm of brain: Secondary | ICD-10-CM | POA: Diagnosis not present

## 2021-10-05 DIAGNOSIS — Z510 Encounter for antineoplastic radiation therapy: Secondary | ICD-10-CM | POA: Diagnosis not present

## 2021-10-05 DIAGNOSIS — L89322 Pressure ulcer of left buttock, stage 2: Secondary | ICD-10-CM | POA: Diagnosis not present

## 2021-10-05 DIAGNOSIS — Z5111 Encounter for antineoplastic chemotherapy: Secondary | ICD-10-CM | POA: Diagnosis not present

## 2021-10-05 DIAGNOSIS — Z79899 Other long term (current) drug therapy: Secondary | ICD-10-CM | POA: Diagnosis not present

## 2021-10-05 DIAGNOSIS — Z171 Estrogen receptor negative status [ER-]: Secondary | ICD-10-CM | POA: Diagnosis not present

## 2021-10-05 DIAGNOSIS — C50211 Malignant neoplasm of upper-inner quadrant of right female breast: Secondary | ICD-10-CM | POA: Diagnosis not present

## 2021-10-05 LAB — RAD ONC ARIA SESSION SUMMARY
Course Elapsed Days: 50
Plan Fractions Treated to Date: 25
Plan Prescribed Dose Per Fraction: 2 Gy
Plan Total Fractions Prescribed: 30
Plan Total Prescribed Dose: 60 Gy
Reference Point Dosage Given to Date: 50 Gy
Reference Point Session Dosage Given: 2 Gy
Session Number: 30

## 2021-10-06 ENCOUNTER — Other Ambulatory Visit: Payer: Self-pay

## 2021-10-06 ENCOUNTER — Ambulatory Visit
Admission: RE | Admit: 2021-10-06 | Discharge: 2021-10-06 | Disposition: A | Discharge status: Home / Self Care | Payer: Medicare Other | Source: Ambulatory Visit | Attending: Radiation Oncology | Admitting: Radiation Oncology

## 2021-10-06 ENCOUNTER — Encounter: Payer: Self-pay | Admitting: Family Medicine

## 2021-10-06 ENCOUNTER — Ambulatory Visit (INDEPENDENT_AMBULATORY_CARE_PROVIDER_SITE_OTHER): Payer: Medicare Other | Admitting: Family Medicine

## 2021-10-06 VITALS — BP 121/62 | HR 110 | Temp 98.0°F | Ht 68.0 in | Wt 173.4 lb

## 2021-10-06 DIAGNOSIS — L89321 Pressure ulcer of left buttock, stage 1: Secondary | ICD-10-CM | POA: Diagnosis not present

## 2021-10-06 DIAGNOSIS — E782 Mixed hyperlipidemia: Secondary | ICD-10-CM

## 2021-10-06 DIAGNOSIS — Z5111 Encounter for antineoplastic chemotherapy: Secondary | ICD-10-CM | POA: Diagnosis not present

## 2021-10-06 DIAGNOSIS — E119 Type 2 diabetes mellitus without complications: Secondary | ICD-10-CM | POA: Insufficient documentation

## 2021-10-06 DIAGNOSIS — C7931 Secondary malignant neoplasm of brain: Secondary | ICD-10-CM | POA: Diagnosis not present

## 2021-10-06 DIAGNOSIS — C50211 Malignant neoplasm of upper-inner quadrant of right female breast: Secondary | ICD-10-CM | POA: Diagnosis not present

## 2021-10-06 DIAGNOSIS — I10 Essential (primary) hypertension: Secondary | ICD-10-CM

## 2021-10-06 DIAGNOSIS — E1165 Type 2 diabetes mellitus with hyperglycemia: Secondary | ICD-10-CM

## 2021-10-06 DIAGNOSIS — Z79899 Other long term (current) drug therapy: Secondary | ICD-10-CM | POA: Diagnosis not present

## 2021-10-06 DIAGNOSIS — C3412 Malignant neoplasm of upper lobe, left bronchus or lung: Secondary | ICD-10-CM | POA: Diagnosis not present

## 2021-10-06 DIAGNOSIS — Z510 Encounter for antineoplastic radiation therapy: Secondary | ICD-10-CM | POA: Diagnosis not present

## 2021-10-06 DIAGNOSIS — Z171 Estrogen receptor negative status [ER-]: Secondary | ICD-10-CM | POA: Diagnosis not present

## 2021-10-06 LAB — RAD ONC ARIA SESSION SUMMARY
Course Elapsed Days: 51
Plan Fractions Treated to Date: 26
Plan Prescribed Dose Per Fraction: 2 Gy
Plan Total Fractions Prescribed: 30
Plan Total Prescribed Dose: 60 Gy
Reference Point Dosage Given to Date: 52 Gy
Reference Point Session Dosage Given: 2 Gy
Session Number: 31

## 2021-10-06 LAB — BAYER DCA HB A1C WAIVED: HB A1C (BAYER DCA - WAIVED): 8.1 % — ABNORMAL HIGH (ref 4.8–5.6)

## 2021-10-06 MED ORDER — LANTUS SOLOSTAR 100 UNIT/ML ~~LOC~~ SOPN: 8.0000 [IU] | PEN_INJECTOR | Freq: Every day | SUBCUTANEOUS | 1 refills | Status: DC

## 2021-10-06 NOTE — Progress Notes (Signed)
Established Patient Office Visit  Subjective   Patient ID: Jennifer Cowan, female    DOB: [birth year]  Age: 76 y.o. MRN: [id number]  Chief Complaint  Patient presents with   Medical Management of Chronic Issues    HPI Here with sister today. Jennifer Cowan is here for follow up of DM. She   She has been off of daily oral dexamethasone for a few weeks now. She continues to have IV steroids with some of her cancer treatments. She stopping her daily steroid, her bloods sugars have come down. For the last few morning her blood sugars have been in the seventies. Blood sugars are rarely over 200 now. If her blood sugar is over 200, she has been taking her sliding scale regular insulin. She continues to take 10 units nightly of lantus.   The pressure ulcer on her buttocks has been healing well. HH has been keeping a eye on this. Denies pain, fever, chills, skin breakdown, or exudate. She has been using barrier creams.   She reports feeling fairly well overall. She is very fatigued. Reports that he appetite has been good and she has not lost any weight.     Review of Systems  Constitutional:  Positive for malaise/fatigue. Negative for chills, fever and weight loss.  Respiratory:  Negative for shortness of breath.   Cardiovascular:  Negative for chest pain, palpitations, orthopnea, claudication and leg swelling.  Gastrointestinal:  Negative for vomiting.      Objective:     BP 121/62   Pulse (!) 110   Temp 98 F (36.7 C)   Ht 5\' 8"  (1.727 m)   Wt 173 lb 6.4 oz (78.7 kg)   SpO2 96%   BMI 26.37 kg/m  Wt Readings from Last 3 Encounters:  10/06/21 173 lb 6.4 oz (78.7 kg)  10/04/21 173 lb 11.2 oz (78.8 kg)  09/27/21 173 lb (78.5 kg)     Physical Exam Vitals and nursing note reviewed.  Constitutional:      General: She is not in acute distress.    Appearance: She is not ill-appearing, toxic-appearing or diaphoretic.  HENT:     Head: Normocephalic and atraumatic.  Cardiovascular:      Rate and Rhythm: Normal rate and regular rhythm.     Heart sounds: Normal heart sounds. No murmur heard. Pulmonary:     Effort: Pulmonary effort is normal. No respiratory distress.     Breath sounds: Normal breath sounds.  Musculoskeletal:     Right lower leg: No edema.     Left lower leg: No edema.  Skin:    General: Skin is warm and dry.  Neurological:     General: No focal deficit present.     Mental Status: She is alert and oriented to person, place, and time.     Gait: Gait normal.  Psychiatric:        Mood and Affect: Mood normal.        Behavior: Behavior normal.      No results found for any visits on 10/06/21.  Last CBC Lab Results  Component Value Date   WBC 3.0 (L) 10/04/2021   HGB 9.8 (L) 10/04/2021   HCT 30.2 (L) 10/04/2021   MCV 92.9 10/04/2021   MCH 30.2 10/04/2021   RDW 16.1 (H) 10/04/2021   PLT 354 40/34/7425   Last metabolic panel Lab Results  Component Value Date   GLUCOSE 130 (H) 10/04/2021   NA 135 10/04/2021   K 3.5 10/04/2021  CL 103 10/04/2021   CO2 25 10/04/2021   BUN 12 10/04/2021   CREATININE 0.46 10/04/2021   GFRNONAA >60 10/04/2021   CALCIUM 9.1 10/04/2021   PROT 5.9 (L) 10/04/2021   ALBUMIN 3.1 (L) 10/04/2021   LABGLOB 3.3 07/07/2021   AGRATIO 1.4 07/07/2021   BILITOT 0.4 10/04/2021   ALKPHOS 54 10/04/2021   AST 12 (L) 10/04/2021   ALT 17 10/04/2021   ANIONGAP 7 10/04/2021   Last lipids Lab Results  Component Value Date   CHOL 187 07/07/2021   HDL 60 07/07/2021   LDLCALC 112 (H) 07/07/2021   TRIG 83 07/07/2021   CHOLHDL 3.1 07/07/2021      The 10-year ASCVD risk score (Arnett DK, et al., 2019) is: 29.9%    Assessment & Plan:   Jennifer Cowan was seen today for medical management of chronic issues.  Diagnoses and all orders for this visit:  Primary hypertension Well controlled on current regimen. On ACE and norvasc.   Type 2 diabetes mellitus with hyperglycemia, without long-term current use of insulin (HCC) A1c not  at goal of <7. At 8.1 today. She is no longer on daily dexamethasone, so blood sugars have lowered significantly. She has been running on the low side for the last few mornings now, so will decrease lantus to 8 units nightly. Continue to monitor blood sugars and notify for persistently elevated or low readings. Continue sliding scale insulin with meals as needed. On ACE and statin.  -     Bayer DCA Hb A1c Waived -     insulin glargine (LANTUS SOLOSTAR) 100 UNIT/ML Solostar Pen; Inject 8 Units into the skin at bedtime.  Mixed hyperlipidemia Labs pending. On simvastatin and zetia.  -     Lipid panel  Pressure injury of left buttock, stage 1 Healing. Continue home wound care.   Return in about 3 months (around 01/05/2022) for chronic follow up.  The patient indicates understanding of these issues and agrees with the plan.    [name], FNP

## 2021-10-07 ENCOUNTER — Other Ambulatory Visit: Payer: Self-pay

## 2021-10-07 ENCOUNTER — Ambulatory Visit
Admission: RE | Admit: 2021-10-07 | Discharge: 2021-10-07 | Disposition: A | Discharge status: Home / Self Care | Payer: Medicare Other | Source: Ambulatory Visit | Attending: Radiation Oncology | Admitting: Radiation Oncology

## 2021-10-07 DIAGNOSIS — Z510 Encounter for antineoplastic radiation therapy: Secondary | ICD-10-CM | POA: Diagnosis not present

## 2021-10-07 DIAGNOSIS — Z5111 Encounter for antineoplastic chemotherapy: Secondary | ICD-10-CM | POA: Diagnosis not present

## 2021-10-07 DIAGNOSIS — C3412 Malignant neoplasm of upper lobe, left bronchus or lung: Secondary | ICD-10-CM | POA: Diagnosis not present

## 2021-10-07 DIAGNOSIS — Z79899 Other long term (current) drug therapy: Secondary | ICD-10-CM | POA: Diagnosis not present

## 2021-10-07 DIAGNOSIS — C7931 Secondary malignant neoplasm of brain: Secondary | ICD-10-CM | POA: Diagnosis not present

## 2021-10-07 DIAGNOSIS — C50211 Malignant neoplasm of upper-inner quadrant of right female breast: Secondary | ICD-10-CM | POA: Diagnosis not present

## 2021-10-07 DIAGNOSIS — Z171 Estrogen receptor negative status [ER-]: Secondary | ICD-10-CM | POA: Diagnosis not present

## 2021-10-07 LAB — RAD ONC ARIA SESSION SUMMARY
Course Elapsed Days: 52
Plan Fractions Treated to Date: 27
Plan Prescribed Dose Per Fraction: 2 Gy
Plan Total Fractions Prescribed: 30
Plan Total Prescribed Dose: 60 Gy
Reference Point Dosage Given to Date: 54 Gy
Reference Point Session Dosage Given: 2 Gy
Session Number: 32

## 2021-10-07 LAB — LIPID PANEL
Chol/HDL Ratio: 7.3 ratio — ABNORMAL HIGH (ref 0.0–4.4)
Cholesterol, Total: 197 mg/dL (ref 100–199)
HDL: 27 mg/dL — ABNORMAL LOW (ref >39)
LDL Chol Calc (NIH): 146 mg/dL — ABNORMAL HIGH (ref 0–99)
Triglycerides: 130 mg/dL (ref 0–149)
VLDL Cholesterol Cal: 24 mg/dL (ref 5–40)

## 2021-10-08 ENCOUNTER — Other Ambulatory Visit: Payer: Self-pay | Admitting: Family Medicine

## 2021-10-08 ENCOUNTER — Ambulatory Visit
Admission: RE | Admit: 2021-10-08 | Discharge: 2021-10-08 | Disposition: A | Discharge status: Home / Self Care | Payer: Medicare Other | Source: Ambulatory Visit | Attending: Radiation Oncology | Admitting: Radiation Oncology

## 2021-10-08 ENCOUNTER — Other Ambulatory Visit: Payer: Self-pay

## 2021-10-08 DIAGNOSIS — C3412 Malignant neoplasm of upper lobe, left bronchus or lung: Secondary | ICD-10-CM | POA: Diagnosis not present

## 2021-10-08 DIAGNOSIS — Z510 Encounter for antineoplastic radiation therapy: Secondary | ICD-10-CM | POA: Diagnosis not present

## 2021-10-08 DIAGNOSIS — C7931 Secondary malignant neoplasm of brain: Secondary | ICD-10-CM | POA: Diagnosis not present

## 2021-10-08 DIAGNOSIS — C50211 Malignant neoplasm of upper-inner quadrant of right female breast: Secondary | ICD-10-CM | POA: Diagnosis not present

## 2021-10-08 DIAGNOSIS — Z79899 Other long term (current) drug therapy: Secondary | ICD-10-CM | POA: Diagnosis not present

## 2021-10-08 DIAGNOSIS — Z171 Estrogen receptor negative status [ER-]: Secondary | ICD-10-CM | POA: Diagnosis not present

## 2021-10-08 DIAGNOSIS — E782 Mixed hyperlipidemia: Secondary | ICD-10-CM

## 2021-10-08 DIAGNOSIS — Z5111 Encounter for antineoplastic chemotherapy: Secondary | ICD-10-CM | POA: Diagnosis not present

## 2021-10-08 LAB — RAD ONC ARIA SESSION SUMMARY
Course Elapsed Days: 53
Plan Fractions Treated to Date: 28
Plan Prescribed Dose Per Fraction: 2 Gy
Plan Total Fractions Prescribed: 30
Plan Total Prescribed Dose: 60 Gy
Reference Point Dosage Given to Date: 56 Gy
Reference Point Session Dosage Given: 2 Gy
Session Number: 33

## 2021-10-08 MED ORDER — ATORVASTATIN CALCIUM 80 MG PO TABS: 80.0000 mg | ORAL_TABLET | Freq: Every day | ORAL | 3 refills | Status: DC

## 2021-10-08 MED FILL — Dexamethasone Sodium Phosphate Inj 100 MG/10ML: INTRAMUSCULAR | Qty: 1 | Status: AC

## 2021-10-11 ENCOUNTER — Inpatient Hospital Stay (HOSPITAL_BASED_OUTPATIENT_CLINIC_OR_DEPARTMENT_OTHER): Payer: Medicare Other | Admitting: Nurse Practitioner

## 2021-10-11 ENCOUNTER — Ambulatory Visit: Payer: Medicare Other

## 2021-10-11 ENCOUNTER — Other Ambulatory Visit: Payer: Self-pay

## 2021-10-11 ENCOUNTER — Inpatient Hospital Stay: Payer: Medicare Other

## 2021-10-11 ENCOUNTER — Ambulatory Visit
Admission: RE | Admit: 2021-10-11 | Discharge: 2021-10-11 | Disposition: A | Discharge status: Home / Self Care | Payer: Medicare Other | Source: Ambulatory Visit | Attending: Radiation Oncology | Admitting: Radiation Oncology

## 2021-10-11 ENCOUNTER — Encounter: Payer: Self-pay | Admitting: Nurse Practitioner

## 2021-10-11 VITALS — BP 114/51 | HR 113 | Temp 98.0°F | Resp 15 | Wt 172.9 lb

## 2021-10-11 VITALS — HR 100

## 2021-10-11 DIAGNOSIS — C3492 Malignant neoplasm of unspecified part of left bronchus or lung: Secondary | ICD-10-CM

## 2021-10-11 DIAGNOSIS — C3412 Malignant neoplasm of upper lobe, left bronchus or lung: Secondary | ICD-10-CM | POA: Diagnosis not present

## 2021-10-11 DIAGNOSIS — Z515 Encounter for palliative care: Secondary | ICD-10-CM | POA: Diagnosis not present

## 2021-10-11 DIAGNOSIS — Z79899 Other long term (current) drug therapy: Secondary | ICD-10-CM | POA: Diagnosis not present

## 2021-10-11 DIAGNOSIS — Z5111 Encounter for antineoplastic chemotherapy: Secondary | ICD-10-CM | POA: Diagnosis not present

## 2021-10-11 DIAGNOSIS — Z510 Encounter for antineoplastic radiation therapy: Secondary | ICD-10-CM | POA: Diagnosis not present

## 2021-10-11 DIAGNOSIS — Z95828 Presence of other vascular implants and grafts: Secondary | ICD-10-CM

## 2021-10-11 DIAGNOSIS — C7931 Secondary malignant neoplasm of brain: Secondary | ICD-10-CM | POA: Diagnosis not present

## 2021-10-11 DIAGNOSIS — C50211 Malignant neoplasm of upper-inner quadrant of right female breast: Secondary | ICD-10-CM | POA: Diagnosis not present

## 2021-10-11 DIAGNOSIS — Z171 Estrogen receptor negative status [ER-]: Secondary | ICD-10-CM | POA: Diagnosis not present

## 2021-10-11 DIAGNOSIS — R739 Hyperglycemia, unspecified: Secondary | ICD-10-CM

## 2021-10-11 DIAGNOSIS — R530 Neoplastic (malignant) related fatigue: Secondary | ICD-10-CM | POA: Diagnosis not present

## 2021-10-11 DIAGNOSIS — C3490 Malignant neoplasm of unspecified part of unspecified bronchus or lung: Secondary | ICD-10-CM

## 2021-10-11 LAB — CMP (CANCER CENTER ONLY)
ALT: 20 U/L (ref 0–44)
AST: 17 U/L (ref 15–41)
Albumin: 3 g/dL — ABNORMAL LOW (ref 3.5–5.0)
Alkaline Phosphatase: 50 U/L (ref 38–126)
Anion gap: 5 (ref 5–15)
BUN: 11 mg/dL (ref 8–23)
CO2: 27 mmol/L (ref 22–32)
Calcium: 8.9 mg/dL (ref 8.9–10.3)
Chloride: 101 mmol/L (ref 98–111)
Creatinine: 0.5 mg/dL (ref 0.44–1.00)
GFR, Estimated: >60 mL/min (ref >60)
Glucose, Bld: 148 mg/dL — ABNORMAL HIGH (ref 70–99)
Potassium: 3.4 mmol/L — ABNORMAL LOW (ref 3.5–5.1)
Sodium: 133 mmol/L — ABNORMAL LOW (ref 135–145)
Total Bilirubin: 0.5 mg/dL (ref 0.3–1.2)
Total Protein: 6.1 g/dL — ABNORMAL LOW (ref 6.5–8.1)

## 2021-10-11 LAB — RAD ONC ARIA SESSION SUMMARY
Course Elapsed Days: 56
Plan Fractions Treated to Date: 29
Plan Prescribed Dose Per Fraction: 2 Gy
Plan Total Fractions Prescribed: 30
Plan Total Prescribed Dose: 60 Gy
Reference Point Dosage Given to Date: 58 Gy
Reference Point Session Dosage Given: 2 Gy
Session Number: 34

## 2021-10-11 LAB — CBC WITH DIFFERENTIAL (CANCER CENTER ONLY)
Abs Immature Granulocytes: 0.02 10*3/uL (ref 0.00–0.07)
Basophils Absolute: 0 10*3/uL (ref 0.0–0.1)
Basophils Relative: 1 %
Eosinophils Absolute: 0.1 10*3/uL (ref 0.0–0.5)
Eosinophils Relative: 2 %
HCT: 28.8 % — ABNORMAL LOW (ref 36.0–46.0)
Hemoglobin: 9.7 g/dL — ABNORMAL LOW (ref 12.0–15.0)
Immature Granulocytes: 1 %
Lymphocytes Relative: 2 %
Lymphs Abs: 0.1 10*3/uL — ABNORMAL LOW (ref 0.7–4.0)
MCH: 30.6 pg (ref 26.0–34.0)
MCHC: 33.7 g/dL (ref 30.0–36.0)
MCV: 90.9 fL (ref 80.0–100.0)
Monocytes Absolute: 0.5 10*3/uL (ref 0.1–1.0)
Monocytes Relative: 13 %
Neutro Abs: 3.5 10*3/uL (ref 1.7–7.7)
Neutrophils Relative %: 81 %
Platelet Count: 327 10*3/uL (ref 150–400)
RBC: 3.17 MIL/uL — ABNORMAL LOW (ref 3.87–5.11)
RDW: 16.5 % — ABNORMAL HIGH (ref 11.5–15.5)
WBC Count: 4.2 10*3/uL (ref 4.0–10.5)
nRBC: 0.5 % — ABNORMAL HIGH (ref 0.0–0.2)

## 2021-10-11 MED ORDER — PALONOSETRON HCL INJECTION 0.25 MG/5ML
0.2500 mg | Freq: Once | INTRAVENOUS | Status: AC
  Administered 2021-10-11: 0.25 mg via INTRAVENOUS
  Filled 2021-10-11: qty 5

## 2021-10-11 MED ORDER — SODIUM CHLORIDE 0.9% FLUSH
10.0000 mL | Freq: Once | INTRAVENOUS | Status: AC
  Administered 2021-10-11: 10 mL

## 2021-10-11 MED ORDER — SODIUM CHLORIDE 0.9 % IV SOLN: Freq: Once | INTRAVENOUS | Status: AC

## 2021-10-11 MED ORDER — SODIUM CHLORIDE 0.9 % IV SOLN
45.0000 mg/m2 | Freq: Once | INTRAVENOUS | Status: AC
  Administered 2021-10-11: 84 mg via INTRAVENOUS
  Filled 2021-10-11: qty 14

## 2021-10-11 MED ORDER — HEPARIN SOD (PORK) LOCK FLUSH 100 UNIT/ML IV SOLN
500.0000 [IU] | Freq: Once | INTRAVENOUS | Status: AC | PRN
  Administered 2021-10-11: 500 [IU]

## 2021-10-11 MED ORDER — SODIUM CHLORIDE 0.9% FLUSH
10.0000 mL | INTRAVENOUS | Status: DC | PRN
  Administered 2021-10-11: 10 mL

## 2021-10-11 MED ORDER — SODIUM CHLORIDE 0.9 % IV SOLN
10.0000 mg | Freq: Once | INTRAVENOUS | Status: AC
  Administered 2021-10-11: 10 mg via INTRAVENOUS
  Filled 2021-10-11: qty 10

## 2021-10-11 MED ORDER — CETIRIZINE HCL 10 MG/ML IV SOLN
10.0000 mg | Freq: Once | INTRAVENOUS | Status: AC
  Administered 2021-10-11: 10 mg via INTRAVENOUS
  Filled 2021-10-11: qty 1

## 2021-10-11 MED ORDER — FAMOTIDINE IN NACL 20-0.9 MG/50ML-% IV SOLN
20.0000 mg | Freq: Once | INTRAVENOUS | Status: AC
  Administered 2021-10-11: 20 mg via INTRAVENOUS
  Filled 2021-10-11: qty 50

## 2021-10-11 MED ORDER — SODIUM CHLORIDE 0.9 % IV SOLN
167.8000 mg | Freq: Once | INTRAVENOUS | Status: AC
  Administered 2021-10-11: 170 mg via INTRAVENOUS
  Filled 2021-10-11: qty 17

## 2021-10-11 NOTE — Patient Instructions (Signed)
[facility] ONCOLOGY  Discharge Instructions: Thank you for choosing [facility] to provide your oncology and hematology care.   If you have a lab appointment with the [facility], please go directly to the [facility] and check in at the registration area.   Wear comfortable clothing and clothing appropriate for easy access to any Portacath or PICC line.   We strive to give you quality time with your provider. You may need to reschedule your appointment if you arrive late (15 or more minutes).  Arriving late affects you and other patients whose appointments are after yours.  Also, if you miss three or more appointments without notifying the office, you may be dismissed from the clinic at the provider's discretion.      For prescription refill requests, have your pharmacy contact our office and allow 72 hours for refills to be completed.    Today you received the following chemotherapy and/or immunotherapy agents: carboplatin, paclitaxel      To help prevent nausea and vomiting after your treatment, we encourage you to take your nausea medication as directed.  BELOW ARE SYMPTOMS THAT SHOULD BE REPORTED IMMEDIATELY: *FEVER GREATER THAN 100.4 F (38 C) OR HIGHER *CHILLS OR SWEATING *NAUSEA AND VOMITING THAT IS NOT CONTROLLED WITH YOUR NAUSEA MEDICATION *UNUSUAL SHORTNESS OF BREATH *UNUSUAL BRUISING OR BLEEDING *URINARY PROBLEMS (pain or burning when urinating, or frequent urination) *BOWEL PROBLEMS (unusual diarrhea, constipation, pain near the anus) TENDERNESS IN MOUTH AND THROAT WITH OR WITHOUT PRESENCE OF ULCERS (sore throat, sores in mouth, or a toothache) UNUSUAL RASH, SWELLING OR PAIN  UNUSUAL VAGINAL DISCHARGE OR ITCHING   Items with * indicate a potential emergency and should be followed up as soon as possible or go to the Emergency Department if any problems should occur.  Please show the CHEMOTHERAPY ALERT CARD or IMMUNOTHERAPY ALERT CARD  at check-in to the Emergency Department and triage nurse.  Should you have questions after your visit or need to cancel or reschedule your appointment, please contact [facility]  Dept: [phone]  and follow the prompts.  Office hours are 8:00 a.m. to 4:30 p.m. Monday - Friday. Please note that voicemails left after 4:00 p.m. may not be returned until the following business day.  We are closed weekends and major holidays. You have access to a nurse at all times for urgent questions. Please call the main number to the clinic Dept: [phone] and follow the prompts.   For any non-urgent questions, you may also contact your provider using MyChart. We now offer e-Visits for anyone 38 and older to request care online for non-urgent symptoms. For details visit [email or web address].   Also download the MyChart app! Go to the app store, search "MyChart", open the app, select [facility], and log in with your MyChart username and password.  Masks are optional in the cancer centers. If you would like for your care team to wear a mask while they are taking care of you, please let them know. You may have one support Shekera Beavers who is at least 76 years old accompany you for your appointments.

## 2021-10-11 NOTE — Progress Notes (Signed)
[place]  [phone] [phone]   Name: Jennifer Cowan Date: 10/11/2021 MRN: [id number]  DOB: [birth year]  Patient Care Team: [name], FNP as PCP - General (Family Medicine) Pickenpack-Cousar, Carlena Sax, NP as Nurse Practitioner (Nurse Practitioner)    INTERVAL HISTORY: Jennifer Cowan is a 76 y.o. 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 Palliative ask to see for symptom management and goals of care.   SOCIAL HISTORY:     reports that she has never smoked. She has never used smokeless tobacco. She reports that she does not drink alcohol and does not use drugs.  ADVANCE DIRECTIVES:  Patient does not have an advanced directive however is interested in completing.   CODE STATUS:   PAST MEDICAL HISTORY: Past Medical History:  Diagnosis Date   Anemia    Arthritis    Breast cancer ([place])    Cancer ([place]) 1999   Breast Cancer   Hyperlipidemia    Hypertension    Osteopenia 01/12/2021    ALLERGIES:  has No Known Allergies.  MEDICATIONS:  Current Outpatient Medications  Medication Sig Dispense Refill   Accu-Chek Softclix Lancets lancets Use as directed 4 times a day 100 each 1   acetaminophen (TYLENOL) 500 MG tablet Take 1,000 mg by mouth every 6 (six) hours as needed for moderate pain.     amLODipine (NORVASC) 10 MG tablet TAKE 1 TABLET BY MOUTH DAILY 90 tablet 3   atorvastatin (LIPITOR) 80 MG tablet Take 1 tablet (80 mg total) by mouth daily. 90 tablet 3   benazepril (LOTENSIN) 10 MG tablet TAKE 1 TABLET BY MOUTH DAILY 90 tablet 3   blood glucose meter kit and supplies KIT Dispense based on patient and insurance preference. Use up to four times daily as directed. 1 each 0   Blood Glucose Monitoring Suppl (ACCU-CHEK GUIDE) w/Device KIT Use as directed  4 times a day 1 kit 0   Cholecalciferol (VITAMIN D3) 125 MCG (5000 UT) CAPS Take 5,000 Units by mouth daily.     dexamethasone (DECADRON) 4 MG tablet Take 1 tablet (4 mg total) by mouth 3 (three) times daily. 90 tablet 1   ezetimibe (ZETIA) 10 MG tablet Take 1 tablet (10 mg total) by mouth daily. 90 tablet 3   glucose 4 GM chewable tablet Chew 1 tablet (4 g total) by mouth as needed for low blood sugar. 50 tablet 0   glucose blood test strip Use as directed 4 times a day 100 each 1   insulin glargine (LANTUS SOLOSTAR) 100 UNIT/ML Solostar Pen Inject 8 Units into the skin at bedtime. 15 mL 1   Insulin Pen Needle 31G X 5 MM MISC 1 each by Does not apply route daily. 90 each 1   insulin regular (NOVOLIN R) 100 units/mL injection If blood sugar is 200 or less: 0 units 201-250: 2 units 251-300: 4 units 301-350: 6 units 351-400: 8 units 401 or higher: 10 units and call MD 10 mL 1   Insulin Syringe-Needle U-100 (INSULIN SYRINGE .3CC/29GX1") 29G X 1" 0.3 ML MISC 1 Dose by Does not apply route 3 (three) times daily before meals. 90 each 1   LORazepam (ATIVAN) 0.5 MG tablet 1 tab po 30 minutes prior to radiation or MRI scans 30 tablet 0   meloxicam (MOBIC) 7.5 MG tablet TAKE 1 TABLET BY MOUTH  DAILY 90 tablet 3   prochlorperazine (COMPAZINE) 10 MG tablet Take 1 tablet (10 mg total) by mouth every 6 (six) hours as needed for nausea or vomiting. 30 tablet 1   trolamine salicylate (ASPERCREME) 10 % cream Apply 1 Application topically daily as needed for muscle pain.     No current facility-administered medications for this visit.   Facility-Administered Medications Ordered in Other Visits  Medication Dose Route Frequency Provider Last Rate Last Admin   acetaminophen (TYLENOL) 325 MG tablet            cyanocobalamin (VITAMIN B12) 1000 MCG/ML injection            diphenhydrAMINE (BENADRYL) 25 mg capsule             VITAL SIGNS: BP (!) 114/51 (BP Location: Right Arm, Patient Position: Sitting) Comment: Nurse  notified  Pulse (!) 113 Comment: Nurse notified  Temp 98 F (36.7 C) (Oral)   Resp 15   Wt 172 lb 14.4 oz (78.4 kg)   SpO2 96%   BMI 26.29 kg/m  Filed Weights   10/11/21 1357  Weight: 172 lb 14.4 oz (78.4 kg)    Estimated body mass index is 26.29 kg/m as calculated from the following:   Height as of 10/06/21: _0  (1.727 m).   Weight as of this encounter: 172 lb 14.4 oz (78.4 kg).   PERFORMANCE STATUS (ECOG) : 2 - Symptomatic, <50% confined to bed   Physical Exam General: NAD, in wheelchair  Cardiovascular: regular rate and rhythm Pulmonary: normal breathing pattern  Abdomen: soft, nontender, + bowel sounds Extremities: no edema, no joint deformities Skin: no rashes Neurological: AAO x3, mood appropriate   IMPRESSION: Jennifer Cowan presents to clinic today for follow-up. She is in a wheelchair. Continues to feel well. Is scheduled for treatment today. Is tolerating well.   She is doing much better. Continues to be interactive. Denies pain or discomfort. Denies nausea, vomiting, constipation, or diarrhea. Her appetite is good. Often snacks. Weight is stable at 172lbs.   Expressed goals are to continue treating the treatable allowing Ms. Lineman to continue to thrive. Patient and family appreciative of her current state of health.   No symptom management needs.   I discussed the importance of continued conversation with family and their medical providers regarding overall plan of care and treatment options, ensuring decisions are within the context of the patients values and GOCs.  PLAN: Ongoing goals of care and support. Patient with some improvement.  No symptom needs.  Will plan to follow-up in 4-6 weeks in collaboration with other oncology appointments.    Patient expressed understanding and was in agreement with this plan. She also understands that She can call the clinic at any time with any questions, concerns, or complaints.    Time Total: 20 min   Visit  consisted of counseling and education dealing with the complex and emotionally intense issues of symptom management and palliative care in the setting of serious and potentially life-threatening illness.Greater than 50%  of this time was spent counseling and coordinating care related to the above assessment and plan.  Alda Lea, AGPCNP-BC  Palliative Medicine Team/[place]

## 2021-10-12 ENCOUNTER — Ambulatory Visit: Payer: Medicare Other

## 2021-10-12 ENCOUNTER — Other Ambulatory Visit: Payer: Self-pay

## 2021-10-12 ENCOUNTER — Ambulatory Visit
Admission: RE | Admit: 2021-10-12 | Discharge: 2021-10-12 | Disposition: A | Discharge status: Home / Self Care | Payer: Medicare Other | Source: Ambulatory Visit | Attending: Radiation Oncology | Admitting: Radiation Oncology

## 2021-10-12 DIAGNOSIS — C3412 Malignant neoplasm of upper lobe, left bronchus or lung: Secondary | ICD-10-CM | POA: Diagnosis not present

## 2021-10-12 DIAGNOSIS — Z510 Encounter for antineoplastic radiation therapy: Secondary | ICD-10-CM | POA: Diagnosis not present

## 2021-10-12 DIAGNOSIS — C7931 Secondary malignant neoplasm of brain: Secondary | ICD-10-CM | POA: Diagnosis not present

## 2021-10-12 DIAGNOSIS — Z5111 Encounter for antineoplastic chemotherapy: Secondary | ICD-10-CM | POA: Diagnosis not present

## 2021-10-12 DIAGNOSIS — Z79899 Other long term (current) drug therapy: Secondary | ICD-10-CM | POA: Diagnosis not present

## 2021-10-12 DIAGNOSIS — C50211 Malignant neoplasm of upper-inner quadrant of right female breast: Secondary | ICD-10-CM | POA: Diagnosis not present

## 2021-10-12 DIAGNOSIS — Z171 Estrogen receptor negative status [ER-]: Secondary | ICD-10-CM | POA: Diagnosis not present

## 2021-10-12 LAB — RAD ONC ARIA SESSION SUMMARY
Course Elapsed Days: 57
Plan Fractions Treated to Date: 30
Plan Prescribed Dose Per Fraction: 2 Gy
Plan Total Fractions Prescribed: 30
Plan Total Prescribed Dose: 60 Gy
Reference Point Dosage Given to Date: 60 Gy
Reference Point Session Dosage Given: 2 Gy
Session Number: 35

## 2021-10-13 ENCOUNTER — Ambulatory Visit: Payer: Medicare Other

## 2021-10-13 ENCOUNTER — Other Ambulatory Visit: Payer: Self-pay

## 2021-10-13 ENCOUNTER — Ambulatory Visit
Admission: RE | Admit: 2021-10-13 | Discharge: 2021-10-13 | Disposition: A | Discharge status: Home / Self Care | Payer: Medicare Other | Source: Ambulatory Visit | Attending: Radiation Oncology | Admitting: Radiation Oncology

## 2021-10-13 DIAGNOSIS — C50211 Malignant neoplasm of upper-inner quadrant of right female breast: Secondary | ICD-10-CM | POA: Diagnosis not present

## 2021-10-13 DIAGNOSIS — Z79899 Other long term (current) drug therapy: Secondary | ICD-10-CM | POA: Diagnosis not present

## 2021-10-13 DIAGNOSIS — Z5111 Encounter for antineoplastic chemotherapy: Secondary | ICD-10-CM | POA: Diagnosis not present

## 2021-10-13 DIAGNOSIS — Z171 Estrogen receptor negative status [ER-]: Secondary | ICD-10-CM | POA: Diagnosis not present

## 2021-10-13 DIAGNOSIS — C7931 Secondary malignant neoplasm of brain: Secondary | ICD-10-CM | POA: Diagnosis not present

## 2021-10-13 DIAGNOSIS — Z510 Encounter for antineoplastic radiation therapy: Secondary | ICD-10-CM | POA: Diagnosis not present

## 2021-10-13 DIAGNOSIS — C3412 Malignant neoplasm of upper lobe, left bronchus or lung: Secondary | ICD-10-CM | POA: Diagnosis not present

## 2021-10-13 LAB — RAD ONC ARIA SESSION SUMMARY
Course Elapsed Days: 58
Plan Fractions Treated to Date: 1
Plan Prescribed Dose Per Fraction: 2 Gy
Plan Total Fractions Prescribed: 3
Plan Total Prescribed Dose: 6 Gy
Reference Point Dosage Given to Date: 2 Gy
Reference Point Session Dosage Given: 2 Gy
Session Number: 36

## 2021-10-14 ENCOUNTER — Ambulatory Visit: Payer: Medicare Other

## 2021-10-14 ENCOUNTER — Ambulatory Visit
Admission: RE | Admit: 2021-10-14 | Discharge: 2021-10-14 | Disposition: A | Discharge status: Home / Self Care | Payer: Medicare Other | Source: Ambulatory Visit | Attending: Radiation Oncology | Admitting: Radiation Oncology

## 2021-10-14 ENCOUNTER — Other Ambulatory Visit: Payer: Self-pay

## 2021-10-14 DIAGNOSIS — C50211 Malignant neoplasm of upper-inner quadrant of right female breast: Secondary | ICD-10-CM | POA: Diagnosis not present

## 2021-10-14 DIAGNOSIS — C7931 Secondary malignant neoplasm of brain: Secondary | ICD-10-CM | POA: Diagnosis not present

## 2021-10-14 DIAGNOSIS — Z510 Encounter for antineoplastic radiation therapy: Secondary | ICD-10-CM | POA: Diagnosis not present

## 2021-10-14 DIAGNOSIS — Z171 Estrogen receptor negative status [ER-]: Secondary | ICD-10-CM | POA: Diagnosis not present

## 2021-10-14 DIAGNOSIS — Z79899 Other long term (current) drug therapy: Secondary | ICD-10-CM | POA: Diagnosis not present

## 2021-10-14 DIAGNOSIS — Z5111 Encounter for antineoplastic chemotherapy: Secondary | ICD-10-CM | POA: Diagnosis not present

## 2021-10-14 DIAGNOSIS — C3412 Malignant neoplasm of upper lobe, left bronchus or lung: Secondary | ICD-10-CM | POA: Diagnosis not present

## 2021-10-14 LAB — RAD ONC ARIA SESSION SUMMARY
Course Elapsed Days: 59
Plan Fractions Treated to Date: 2
Plan Prescribed Dose Per Fraction: 2 Gy
Plan Total Fractions Prescribed: 3
Plan Total Prescribed Dose: 6 Gy
Reference Point Dosage Given to Date: 4 Gy
Reference Point Session Dosage Given: 2 Gy
Session Number: 37

## 2021-10-15 ENCOUNTER — Encounter: Payer: Self-pay | Admitting: Radiation Oncology

## 2021-10-15 ENCOUNTER — Ambulatory Visit
Admission: RE | Admit: 2021-10-15 | Discharge: 2021-10-15 | Disposition: A | Discharge status: Home / Self Care | Payer: Medicare Other | Source: Ambulatory Visit | Attending: Radiation Oncology | Admitting: Radiation Oncology

## 2021-10-15 ENCOUNTER — Other Ambulatory Visit: Payer: Self-pay

## 2021-10-15 ENCOUNTER — Ambulatory Visit: Payer: Medicare Other

## 2021-10-15 DIAGNOSIS — Z171 Estrogen receptor negative status [ER-]: Secondary | ICD-10-CM | POA: Diagnosis not present

## 2021-10-15 DIAGNOSIS — C7931 Secondary malignant neoplasm of brain: Secondary | ICD-10-CM | POA: Diagnosis not present

## 2021-10-15 DIAGNOSIS — Z510 Encounter for antineoplastic radiation therapy: Secondary | ICD-10-CM | POA: Diagnosis not present

## 2021-10-15 DIAGNOSIS — Z5111 Encounter for antineoplastic chemotherapy: Secondary | ICD-10-CM | POA: Diagnosis not present

## 2021-10-15 DIAGNOSIS — C3412 Malignant neoplasm of upper lobe, left bronchus or lung: Secondary | ICD-10-CM | POA: Diagnosis not present

## 2021-10-15 DIAGNOSIS — C50211 Malignant neoplasm of upper-inner quadrant of right female breast: Secondary | ICD-10-CM | POA: Diagnosis not present

## 2021-10-15 DIAGNOSIS — Z79899 Other long term (current) drug therapy: Secondary | ICD-10-CM | POA: Diagnosis not present

## 2021-10-15 LAB — RAD ONC ARIA SESSION SUMMARY
Course Elapsed Days: 60
Plan Fractions Treated to Date: 3
Plan Prescribed Dose Per Fraction: 2 Gy
Plan Total Fractions Prescribed: 3
Plan Total Prescribed Dose: 6 Gy
Reference Point Dosage Given to Date: 6 Gy
Reference Point Session Dosage Given: 2 Gy
Session Number: 38

## 2021-10-18 ENCOUNTER — Ambulatory Visit: Payer: Medicare Other

## 2021-10-23 ENCOUNTER — Other Ambulatory Visit (HOSPITAL_COMMUNITY): Payer: Self-pay

## 2021-10-25 ENCOUNTER — Ambulatory Visit
Admission: RE | Admit: 2021-10-25 | Discharge: 2021-10-25 | Disposition: A | Discharge status: Home / Self Care | Payer: Medicare Other | Source: Ambulatory Visit | Attending: Radiation Oncology | Admitting: Radiation Oncology

## 2021-10-25 MED ORDER — SUCRALFATE 1 G PO TABS: 1.0000 g | ORAL_TABLET | Freq: Three times a day (TID) | ORAL | 2 refills | Status: DC

## 2021-10-25 NOTE — Progress Notes (Signed)
  Radiation Oncology         [phone] ________________________________  Name: Jennifer Cowan MRN: [id number]  Date of Service: 10/25/2021  DOB: May 25, 1945  Post Treatment Telephone Note  Diagnosis:  Stage IV, poorly differentiated NSCLC of the LUL and synchronous cT3N0M0, grade 3, triple negative invasive ductal carcinoma of the right breast with a remote history of 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   Intent: Palliative  Radiation Treatment Dates:    08/16/2021 through 08/20/2021 SRS Treatment was administered with IMRT technique with 6XFFF beam energy to the following sites and doses:   PTV_1_Rtemp_54 mm received 30 Gy in 5 fractions  PTV_2_Lparafal_36 mm received 27 Gy in 3 fractions PTV_3_Ltemp_3 mm received 20 Gy in 1 fraction PTV_4_Loccipit_58mm received 20 Gy in 1 fraction PTV_5_Rtemp_30mm received 30 Gy in 3 fractions PTV_6_Loccipit_55mm received 20 Gy in 1 fraction(as documented in provider EOT note)   The patient was not available for call today.  The patient did not note fatigue during radiation. The patient did not note hair loss or skin changes in the field of radiation during therapy.  The patient isis not taking dexamethasone. The patient does not have symptoms of  weakness or loss of control of the extremities. The patient does not have symptoms of headache. The patient does not have symptoms of seizure or uncontrolled movement. The patient does not have symptoms of changes in vision. The patient does not have changes in speech.  The patient does not have confusion. Pt has had dysphagia from recent radiation she also completed to her lung. New rx sent in for carafate.  The patient was counseled that she will be contacted by our brain and spine navigator to schedule surveillance imaging. The patient was encouraged to call if  she have not received a call to schedule imaging, or if she develop concerns or questions regarding radiation. The patient will  also continue to follow up with Dr. Chryl Heck in medical oncology.        Carola Rhine, PAC

## 2021-10-27 ENCOUNTER — Ambulatory Visit: Payer: Medicare Other | Admitting: Hematology and Oncology

## 2021-10-27 ENCOUNTER — Other Ambulatory Visit: Payer: Medicare Other

## 2021-10-31 DIAGNOSIS — C50919 Malignant neoplasm of unspecified site of unspecified female breast: Secondary | ICD-10-CM | POA: Diagnosis not present

## 2021-10-31 DIAGNOSIS — G9389 Other specified disorders of brain: Secondary | ICD-10-CM | POA: Diagnosis not present

## 2021-10-31 DIAGNOSIS — R918 Other nonspecific abnormal finding of lung field: Secondary | ICD-10-CM | POA: Diagnosis not present

## 2021-10-31 DIAGNOSIS — R5381 Other malaise: Secondary | ICD-10-CM | POA: Diagnosis not present

## 2021-11-02 ENCOUNTER — Other Ambulatory Visit: Payer: Self-pay | Admitting: Emergency Medicine

## 2021-11-02 ENCOUNTER — Other Ambulatory Visit: Payer: Self-pay | Admitting: *Deleted

## 2021-11-02 ENCOUNTER — Inpatient Hospital Stay (HOSPITAL_BASED_OUTPATIENT_CLINIC_OR_DEPARTMENT_OTHER): Payer: Medicare Other | Admitting: Hematology and Oncology

## 2021-11-02 ENCOUNTER — Encounter: Payer: Self-pay | Admitting: Nurse Practitioner

## 2021-11-02 ENCOUNTER — Other Ambulatory Visit: Payer: Self-pay

## 2021-11-02 ENCOUNTER — Inpatient Hospital Stay (HOSPITAL_BASED_OUTPATIENT_CLINIC_OR_DEPARTMENT_OTHER): Payer: Medicare Other | Admitting: Nurse Practitioner

## 2021-11-02 ENCOUNTER — Encounter: Payer: Self-pay | Admitting: Hematology and Oncology

## 2021-11-02 ENCOUNTER — Inpatient Hospital Stay: Payer: Medicare Other | Attending: Hematology and Oncology

## 2021-11-02 VITALS — BP 98/61 | HR 105 | Temp 97.7°F | Resp 16 | Ht 68.0 in | Wt 167.4 lb

## 2021-11-02 DIAGNOSIS — C3490 Malignant neoplasm of unspecified part of unspecified bronchus or lung: Secondary | ICD-10-CM

## 2021-11-02 DIAGNOSIS — R530 Neoplastic (malignant) related fatigue: Secondary | ICD-10-CM

## 2021-11-02 DIAGNOSIS — Z515 Encounter for palliative care: Secondary | ICD-10-CM | POA: Diagnosis not present

## 2021-11-02 DIAGNOSIS — C7931 Secondary malignant neoplasm of brain: Secondary | ICD-10-CM | POA: Insufficient documentation

## 2021-11-02 DIAGNOSIS — R630 Anorexia: Secondary | ICD-10-CM

## 2021-11-02 DIAGNOSIS — E1165 Type 2 diabetes mellitus with hyperglycemia: Secondary | ICD-10-CM | POA: Insufficient documentation

## 2021-11-02 DIAGNOSIS — D509 Iron deficiency anemia, unspecified: Secondary | ICD-10-CM | POA: Insufficient documentation

## 2021-11-02 DIAGNOSIS — C3492 Malignant neoplasm of unspecified part of left bronchus or lung: Secondary | ICD-10-CM

## 2021-11-02 DIAGNOSIS — I10 Essential (primary) hypertension: Secondary | ICD-10-CM | POA: Diagnosis not present

## 2021-11-02 DIAGNOSIS — Z79899 Other long term (current) drug therapy: Secondary | ICD-10-CM | POA: Insufficient documentation

## 2021-11-02 DIAGNOSIS — C50919 Malignant neoplasm of unspecified site of unspecified female breast: Secondary | ICD-10-CM | POA: Diagnosis not present

## 2021-11-02 DIAGNOSIS — Z5111 Encounter for antineoplastic chemotherapy: Secondary | ICD-10-CM | POA: Insufficient documentation

## 2021-11-02 LAB — CBC WITH DIFFERENTIAL (CANCER CENTER ONLY)
Abs Immature Granulocytes: 0.01 10*3/uL (ref 0.00–0.07)
Basophils Absolute: 0 10*3/uL (ref 0.0–0.1)
Basophils Relative: 0 %
Eosinophils Absolute: 0.1 10*3/uL (ref 0.0–0.5)
Eosinophils Relative: 2 %
HCT: 33.3 % — ABNORMAL LOW (ref 36.0–46.0)
Hemoglobin: 10.8 g/dL — ABNORMAL LOW (ref 12.0–15.0)
Immature Granulocytes: 0 %
Lymphocytes Relative: 9 %
Lymphs Abs: 0.3 10*3/uL — ABNORMAL LOW (ref 0.7–4.0)
MCH: 30.3 pg (ref 26.0–34.0)
MCHC: 32.4 g/dL (ref 30.0–36.0)
MCV: 93.5 fL (ref 80.0–100.0)
Monocytes Absolute: 0.4 10*3/uL (ref 0.1–1.0)
Monocytes Relative: 13 %
Neutro Abs: 2.5 10*3/uL (ref 1.7–7.7)
Neutrophils Relative %: 76 %
Platelet Count: 435 10*3/uL — ABNORMAL HIGH (ref 150–400)
RBC: 3.56 MIL/uL — ABNORMAL LOW (ref 3.87–5.11)
RDW: 17.2 % — ABNORMAL HIGH (ref 11.5–15.5)
WBC Count: 3.3 10*3/uL — ABNORMAL LOW (ref 4.0–10.5)
nRBC: 0 % (ref 0.0–0.2)

## 2021-11-02 LAB — CMP (CANCER CENTER ONLY)
ALT: 14 U/L (ref 0–44)
AST: 15 U/L (ref 15–41)
Albumin: 3.1 g/dL — ABNORMAL LOW (ref 3.5–5.0)
Alkaline Phosphatase: 73 U/L (ref 38–126)
Anion gap: 8 (ref 5–15)
BUN: 7 mg/dL — ABNORMAL LOW (ref 8–23)
CO2: 30 mmol/L (ref 22–32)
Calcium: 9.2 mg/dL (ref 8.9–10.3)
Chloride: 101 mmol/L (ref 98–111)
Creatinine: 0.48 mg/dL (ref 0.44–1.00)
GFR, Estimated: >60 mL/min (ref >60)
Glucose, Bld: 110 mg/dL — ABNORMAL HIGH (ref 70–99)
Potassium: 3.1 mmol/L — ABNORMAL LOW (ref 3.5–5.1)
Sodium: 139 mmol/L (ref 135–145)
Total Bilirubin: 0.5 mg/dL (ref 0.3–1.2)
Total Protein: 6.2 g/dL — ABNORMAL LOW (ref 6.5–8.1)

## 2021-11-02 NOTE — Assessment & Plan Note (Signed)
She continues to have response in the right breast mass with neoadjuvant chemoradiation for lung cancer.  Since last visit, right breast mass appears to have shrunk a bit more.  I hope that the immunotherapy that we will use adjuvantly for the lung cancer will also benefit the triple negative breast cancer.  If on her repeat imaging and in the future imaging she has no active evidence of lung cancer and if she still remains to be a good candidate, we can once again discussed her in the breast tumor board if she will be a candidate for resection of the right sided breast cancer.
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
## 2021-11-02 NOTE — Assessment & Plan Note (Signed)
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  She also appears to have had some regional adenopathy including prevascular, AP window and left hilar adenopathy.  She has multiple intracranial metastatic lesions which are likely secondary to metastatic lung cancer given presence of regional adenopathy.  She is now status post SRS to the brain lesions.  She started concurrent chemoradiation with CarboTaxol on August 30, 2021.  She completed chemoradiation on October 15, 2021.  She is here for follow-up.  She will have repeat imaging in about 6 to 8 weeks to assess response.  In the interim we have talked about initiating immunotherapy which will also benefit the triple negative breast cancer.  Unfortunately she is not a robust candidate for intensive chemotherapy.  It also appears that there are some ongoing social issues about she going to an assisted living versus staying at home.  I have encouraged him to reach out to Education officer, museum.

## 2021-11-02 NOTE — Progress Notes (Signed)
[place]  [phone] [phone]) [phone]   Name: Jennifer Cowan Date: 11/02/2021 MRN: [id number]  DOB: [birth year]  Patient Care Team: [name], FNP as PCP - General (Family Medicine) Pickenpack-Cousar, Carlena Sax, NP as Nurse Practitioner (Nurse Practitioner)    INTERVAL HISTORY: Jennifer Cowan is a 76 y.o. 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 Palliative ask to see for symptom management and goals of care.   SOCIAL HISTORY:     reports that she has never smoked. She has never used smokeless tobacco. She reports that she does not drink alcohol and does not use drugs.  ADVANCE DIRECTIVES:  Patient does not have an advanced directive however is interested in completing.   CODE STATUS:   PAST MEDICAL HISTORY: Past Medical History:  Diagnosis Date   Anemia    Arthritis    Breast cancer ([place])    Cancer ([place]) 1999   Breast Cancer   Hyperlipidemia    Hypertension    Osteopenia 01/12/2021    ALLERGIES:  has No Known Allergies.  MEDICATIONS:  Current Outpatient Medications  Medication Sig Dispense Refill   Accu-Chek Softclix Lancets lancets Use as directed 4 times a day 100 each 1   acetaminophen (TYLENOL) 500 MG tablet Take 1,000 mg by mouth every 6 (six) hours as needed for moderate pain.     amLODipine (NORVASC) 10 MG tablet TAKE 1 TABLET BY MOUTH DAILY 90 tablet 3   atorvastatin (LIPITOR) 80 MG tablet Take 1 tablet (80 mg total) by mouth daily. 90 tablet 3   benazepril (LOTENSIN) 10 MG tablet TAKE 1 TABLET BY MOUTH DAILY 90 tablet 3   blood glucose meter kit and supplies KIT Dispense based on patient and insurance preference. Use up to four times daily as directed. 1 each 0   Blood Glucose Monitoring Suppl (ACCU-CHEK GUIDE) w/Device KIT Use as directed  4 times a day 1 kit 0   Cholecalciferol (VITAMIN D3) 125 MCG (5000 UT) CAPS Take 5,000 Units by mouth daily.     dexamethasone (DECADRON) 4 MG tablet Take 1 tablet (4 mg total) by mouth 3 (three) times daily. 90 tablet 1   ezetimibe (ZETIA) 10 MG tablet Take 1 tablet (10 mg total) by mouth daily. 90 tablet 3   glucose 4 GM chewable tablet Chew 1 tablet (4 g total) by mouth as needed for low blood sugar. 50 tablet 0   glucose blood test strip Use as directed 4 times a day 100 each 1   insulin glargine (LANTUS SOLOSTAR) 100 UNIT/ML Solostar Pen Inject 8 Units into the skin at bedtime. 15 mL 1   Insulin Pen Needle 31G X 5 MM MISC 1 each by Does not apply route daily. 90 each 1   insulin regular (NOVOLIN R) 100 units/mL injection If blood sugar is 200 or less: 0 units 201-250: 2 units 251-300: 4 units 301-350: 6 units 351-400: 8 units 401 or higher: 10 units and call MD 10 mL 1   Insulin Syringe-Needle U-100 (INSULIN SYRINGE .3CC/29GX1") 29G X 1" 0.3 ML MISC 1 Dose by Does not apply route 3 (three) times daily before meals. 90 each 1   LORazepam (ATIVAN) 0.5 MG tablet 1 tab po 30 minutes prior to radiation or MRI scans 30 tablet 0   meloxicam (MOBIC) 7.5 MG tablet TAKE 1 TABLET BY MOUTH  DAILY 90 tablet 3   prochlorperazine (COMPAZINE) 10 MG tablet Take 1 tablet (10 mg total) by mouth every 6 (six) hours as needed for nausea or vomiting. 30 tablet 1   sucralfate (CARAFATE) 1 g tablet Take 1 tablet (1 g total) by mouth 4 (four) times daily -  with meals and at bedtime. Crush 1 tablet in 1 oz water and drink 5 min before meals for radiation induced esophagitis 120 tablet 2   trolamine salicylate (ASPERCREME) 10 % cream Apply 1 Application topically daily as needed for muscle pain.     No current facility-administered medications for this visit.   Facility-Administered Medications Ordered in Other Visits  Medication Dose Route Frequency Provider Last Rate Last Admin   acetaminophen (TYLENOL) 325 MG  tablet            cyanocobalamin (VITAMIN B12) 1000 MCG/ML injection            diphenhydrAMINE (BENADRYL) 25 mg capsule             VITAL SIGNS: There were no vitals taken for this visit. There were no vitals filed for this visit.   Estimated body mass index is 25.45 kg/m as calculated from the following:   Height as of an earlier encounter on 11/02/21: _0  (1.727 m).   Weight as of an earlier encounter on 11/02/21: 167 lb 6.4 oz (75.9 kg).   PERFORMANCE STATUS (ECOG) : 2 - Symptomatic, <50% confined to bed   Physical Exam General: NAD, in wheelchair  Cardiovascular: regular rate and rhythm Pulmonary: normal breathing pattern  Extremities: no edema, no joint deformities Skin: no rashes Neurological: AAO x3, mood appropriate   IMPRESSION: Jennifer Cowan presents to clinic today for follow-up. She is in a wheelchair.  Her sister and niece are present with her today.   Family shares they continue to take things 1 day at a time.  Patient is having some occasional behavior challenges where she exhibits aggressive behaviors and will often refuse to eat.  They are trying to continue encouraging patient to increase nutrition.  Offering protein drinks while also monitoring her blood sugars.[name] states her blood sugars have been running in the low 90s.  Current weight is 167.6 pounds down from 172.14 on 9/25.  Education provided on ways to increase protein.  Encouraged family to focus on small frequent meals versus 3 large meals per day and also encouraging patient to snack in between meals and when she has a desire.  Patient Sister Mardene Celeste continues to provide daily care and offer support.  Family is interested in additional home support to assist with some ADLs.   Expressed goals remain clear by family to continue treating the treatable allowing Jennifer Cowan to continue to thrive. Patient and family appreciative of her current state of health.   No symptom management needs.   I  discussed the importance of continued conversation with family and their medical providers regarding overall plan of care and treatment options, ensuring decisions are within the context of the patients values and GOCs.  PLAN: Ongoing goals of care and support. Patient with some improvement.  No symptom needs at this time.  Will send referral to our CSW for ongoing support.  Will plan to follow-up in 4-6 weeks in collaboration with other oncology appointments.    Patient expressed understanding and was in agreement with this plan. She also understands that She can call the clinic at any time with any questions, concerns, or complaints.  Time Total: 40 min   Visit consisted of counseling and education dealing with the complex and emotionally intense issues of symptom management and palliative care in the setting of serious and potentially life-threatening illness.Greater than 50%  of this time was spent counseling and coordinating care related to the above assessment and plan.  Alda Lea, AGPCNP-BC  Palliative Medicine Team/[place]

## 2021-11-03 ENCOUNTER — Inpatient Hospital Stay: Payer: Medicare Other | Admitting: Licensed Clinical Social Worker

## 2021-11-03 NOTE — Progress Notes (Signed)
[place] Work  Clinical Social Work was referred by medical provider for assessment of psychosocial needs.  Clinical Social Worker contacted caregiver by phone (niece- Geraldo Pitter) to offer support and assess for needs.    Per niece, pt's sisters have been providing care for pt with ADLs and this has been particularly difficult around meals as pt has not been wanting to eat. This may be improving as she heals from treatment and now with assistance from providers. Family is interested in options for extra assistance, possibly through a SNF prior to assisted living. CSW discussed potential differences in options with niece as well as in-home aide/respite care. Niece expressed interest in trying respite first. CSW placed referral to [facility] [employer] [facility] to explore options as well as reached out to [facility]ability[facility] in [place] to explore options and if there are any grants available.  CSW will follow-up by phone with niece next week to check progress.     [place], [place] Worker [employer]

## 2021-11-04 ENCOUNTER — Encounter: Payer: Self-pay | Admitting: Hematology and Oncology

## 2021-11-04 NOTE — Progress Notes (Signed)
                                                                                                                                                             Patient Name: Jennifer Cowan MRN: [id number] DOB: [birth year] Referring Physician: Sabino Gasser DAVID Date of Service: 10/15/2021 [facility], [place]                                                        End Of Treatment Note  Diagnoses: C34.12-Malignant neoplasm of upper lobe, left bronchus or lung C79.31-Secondary malignant neoplasm of brain  Cancer Staging: Stage IV, poorly differentiated NSCLC of the LUL and synchronous cT3N0M0, grade 3, triple negative invasive ductal carcinoma of the right breast with a remote history of 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    Intent: Palliative  Radiation Treatment Dates: 08/30/2021 through 10/15/2021 Site Technique Total Dose (Gy) Dose per Fx (Gy) Completed Fx Beam Energies  Lung, Left: Lung_L 3D 60/60 2 30/30 6X, 10X  Lung, Left: Lung_L_Bst 3D 6/6 2 3/3 6X, 10X   Narrative: The patient tolerated radiation therapy relatively well. She developed fatigue and anticipated skin changes in the treatment field. No esophagitis was noted.  Plan: The patient will receive a call in about one month from the radiation oncology department. She will continue follow up with Dr. Chryl Heck as well.  ________________________________________________    Carola Rhine, [facility]

## 2021-11-09 ENCOUNTER — Telehealth: Payer: Self-pay | Admitting: Licensed Clinical Social Worker

## 2021-11-09 NOTE — Telephone Encounter (Signed)
[place] CSW Progress Note  Clinical Social Worker attempted to contact pt's niece to follow up on discussion and resources from last week (see note). Niece unable to speak initially, asked for a return call. CSW returned call later. No answer, left VM.    Jovon Streetman E Jaryiah Mehlman, LCSW

## 2021-11-12 ENCOUNTER — Other Ambulatory Visit (HOSPITAL_COMMUNITY): Payer: Self-pay

## 2021-11-12 ENCOUNTER — Other Ambulatory Visit: Payer: Self-pay | Admitting: Nurse Practitioner

## 2021-11-12 MED ORDER — ACCU-CHEK GUIDE VI STRP
ORAL_STRIP | 1 refills | Status: DC
  Filled 2021-11-12: qty 100, 25d supply, fill #0
  Filled 2021-12-26: qty 100, 25d supply, fill #1

## 2021-11-12 NOTE — Telephone Encounter (Signed)
Med refill, see new orders

## 2021-11-16 ENCOUNTER — Encounter: Payer: Self-pay | Admitting: Hematology and Oncology

## 2021-11-16 ENCOUNTER — Telehealth: Payer: Self-pay | Admitting: *Deleted

## 2021-11-16 ENCOUNTER — Inpatient Hospital Stay: Payer: Medicare Other

## 2021-11-16 ENCOUNTER — Other Ambulatory Visit: Payer: Self-pay

## 2021-11-16 ENCOUNTER — Encounter: Payer: Self-pay | Admitting: Adult Health

## 2021-11-16 ENCOUNTER — Inpatient Hospital Stay (HOSPITAL_BASED_OUTPATIENT_CLINIC_OR_DEPARTMENT_OTHER): Payer: Medicare Other | Admitting: Adult Health

## 2021-11-16 VITALS — BP 97/46 | HR 94 | Temp 97.0°F | Resp 16 | Ht 68.0 in | Wt 166.1 lb

## 2021-11-16 DIAGNOSIS — C3490 Malignant neoplasm of unspecified part of unspecified bronchus or lung: Secondary | ICD-10-CM | POA: Diagnosis not present

## 2021-11-16 DIAGNOSIS — C7931 Secondary malignant neoplasm of brain: Secondary | ICD-10-CM | POA: Diagnosis not present

## 2021-11-16 DIAGNOSIS — I10 Essential (primary) hypertension: Secondary | ICD-10-CM | POA: Diagnosis not present

## 2021-11-16 DIAGNOSIS — Z5111 Encounter for antineoplastic chemotherapy: Secondary | ICD-10-CM | POA: Diagnosis not present

## 2021-11-16 DIAGNOSIS — E1165 Type 2 diabetes mellitus with hyperglycemia: Secondary | ICD-10-CM | POA: Diagnosis not present

## 2021-11-16 DIAGNOSIS — D509 Iron deficiency anemia, unspecified: Secondary | ICD-10-CM | POA: Diagnosis not present

## 2021-11-16 DIAGNOSIS — Z79899 Other long term (current) drug therapy: Secondary | ICD-10-CM | POA: Diagnosis not present

## 2021-11-16 LAB — CBC WITH DIFFERENTIAL (CANCER CENTER ONLY)
Abs Immature Granulocytes: 0.01 10*3/uL (ref 0.00–0.07)
Basophils Absolute: 0 10*3/uL (ref 0.0–0.1)
Basophils Relative: 1 %
Eosinophils Absolute: 0.2 10*3/uL (ref 0.0–0.5)
Eosinophils Relative: 4 %
HCT: 35 % — ABNORMAL LOW (ref 36.0–46.0)
Hemoglobin: 11.3 g/dL — ABNORMAL LOW (ref 12.0–15.0)
Immature Granulocytes: 0 %
Lymphocytes Relative: 9 %
Lymphs Abs: 0.4 10*3/uL — ABNORMAL LOW (ref 0.7–4.0)
MCH: 30.7 pg (ref 26.0–34.0)
MCHC: 32.3 g/dL (ref 30.0–36.0)
MCV: 95.1 fL (ref 80.0–100.0)
Monocytes Absolute: 0.7 10*3/uL (ref 0.1–1.0)
Monocytes Relative: 17 %
Neutro Abs: 2.9 10*3/uL (ref 1.7–7.7)
Neutrophils Relative %: 69 %
Platelet Count: 438 10*3/uL — ABNORMAL HIGH (ref 150–400)
RBC: 3.68 MIL/uL — ABNORMAL LOW (ref 3.87–5.11)
RDW: 16.5 % — ABNORMAL HIGH (ref 11.5–15.5)
WBC Count: 4.1 10*3/uL (ref 4.0–10.5)
nRBC: 0 % (ref 0.0–0.2)

## 2021-11-16 LAB — TSH: TSH: 1.404 u[IU]/mL (ref 0.350–4.500)

## 2021-11-16 LAB — CMP (CANCER CENTER ONLY)
ALT: 17 U/L (ref 0–44)
AST: 23 U/L (ref 15–41)
Albumin: 3.4 g/dL — ABNORMAL LOW (ref 3.5–5.0)
Alkaline Phosphatase: 63 U/L (ref 38–126)
Anion gap: 7 (ref 5–15)
BUN: 9 mg/dL (ref 8–23)
CO2: 31 mmol/L (ref 22–32)
Calcium: 9.3 mg/dL (ref 8.9–10.3)
Chloride: 102 mmol/L (ref 98–111)
Creatinine: 0.49 mg/dL (ref 0.44–1.00)
GFR, Estimated: >60 mL/min (ref >60)
Glucose, Bld: 109 mg/dL — ABNORMAL HIGH (ref 70–99)
Potassium: 2.9 mmol/L — ABNORMAL LOW (ref 3.5–5.1)
Sodium: 140 mmol/L (ref 135–145)
Total Bilirubin: 0.5 mg/dL (ref 0.3–1.2)
Total Protein: 6.6 g/dL (ref 6.5–8.1)

## 2021-11-16 MED ORDER — SODIUM CHLORIDE 0.9% FLUSH
10.0000 mL | INTRAVENOUS | Status: DC | PRN
  Administered 2021-11-16: 10 mL

## 2021-11-16 MED ORDER — HEPARIN SOD (PORK) LOCK FLUSH 100 UNIT/ML IV SOLN
500.0000 [IU] | Freq: Once | INTRAVENOUS | Status: AC | PRN
  Administered 2021-11-16: 500 [IU]

## 2021-11-16 MED ORDER — SODIUM CHLORIDE 0.9 % IV SOLN: Freq: Once | INTRAVENOUS | Status: AC

## 2021-11-16 MED ORDER — POTASSIUM CHLORIDE ER 10 MEQ PO CPCR: 20.0000 meq | ORAL_CAPSULE | Freq: Every day | ORAL | 1 refills | Status: DC

## 2021-11-16 MED ORDER — SODIUM CHLORIDE 0.9 % IV SOLN
200.0000 mg | Freq: Once | INTRAVENOUS | Status: AC
  Administered 2021-11-16: 200 mg via INTRAVENOUS
  Filled 2021-11-16: qty 200

## 2021-11-16 NOTE — Assessment & Plan Note (Signed)
Jennifer Cowan is a 76 year old woman with both lung cancer and breast cancer.  She is status post chemoradiation for the lung cancer and will begin [place] today.  We discussed the risks and benefits in detail and I reviewed with her family to be on the look out for side effects including substantial fatigue or skin rashes or diarrhea.  Other abnormalities that can happen such as thyroid level changes or liver enzyme elevation will detect on labs.  They verbalized understanding of this.  I am hopeful that her breast tumor will continue to shrink as well as continue to treat her lung cancer.  She will return in 3 weeks for labs, follow-up with Dr. Chryl Heck, and her next treatment.

## 2021-11-16 NOTE — Progress Notes (Signed)
[facility] Follow up:    Jennifer Cowan, [place] [id number]   DIAGNOSIS:  Cancer Staging  No matching staging information was found for the patient.  SUMMARY OF ONCOLOGIC HISTORY: Oncology History  Malignant neoplasm of unspecified part of unspecified bronchus or lung ([place])  08/13/2021 Initial Diagnosis   Malignant neoplasm of unspecified part of unspecified bronchus or lung ([place])   08/30/2021 - 09/13/2021 Chemotherapy   Patient is on Treatment Plan : LUNG Carboplatin / Paclitaxel + XRT q7d     08/30/2021 - 10/11/2021 Chemotherapy   Patient is on Treatment Plan : LUNG Carboplatin + Paclitaxel + XRT q7d     11/16/2021 -  Chemotherapy   Patient is on Treatment Plan : LUNG Pembrolizumab Q 21D       CURRENT THERAPY: Pembrolizumab  INTERVAL HISTORY: Jennifer Cowan 76 y.o. female returns for follow-up of her breast and lung cancer.  She is due to receive Keytruda given every 3 weeks to begin today.  She is accompanied by her family and is feeling well today.  They note that the breast mass is softer.  She denies any new pain.  She did have some difficulty swallowing and esophagitis that was relieved with Carafate.   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  MEDICAL HISTORY: Past Medical History:  Diagnosis Date   Anemia    Arthritis    Breast cancer ([place])    Cancer ([place]) 1999   Breast Cancer   Hyperlipidemia    Hypertension    Osteopenia 01/12/2021    SURGICAL HISTORY: Past Surgical History:  Procedure Laterality Date   BREAST SURGERY  1999   Masectomy- left   BRONCHIAL BIOPSY  07/30/2021   Procedure: BRONCHIAL BIOPSIES;  Surgeon: Collene Gobble, MD;  [facility] END[facility]COPY;  Service: Pulmonary;;   BRONCHIAL BRUSHINGS  07/30/2021   Procedure: BRONCHIAL BRUSHINGS;  Surgeon: Collene Gobble, MD;  Location[facility] ENDOSCOPY;  Service: Pulmonary;;   BRONCHIAL NEEDLE ASPIRATION BIOPSY  07/30/2021   Procedure: BRONCHIAL NEEDLE ASPIRATION BIOPSIES;  Surgeon: Collene Gobble, MD;  Location: [facility] ENDOSCOPY;  Service: Pulmonary;;   CYSTECTOMY     cyst removed off top of head   IR IMAGING GUIDED PORT INSERTION  08/20/2021   MASTECTOMY     VIDEO BRONCHOSCOPY WITH RADIAL ENDOBRONCHIAL ULTRASOUND  07/30/2021   Procedure: VIDEO BRONCHOSCOPY WITH RADIAL ENDOBRONCHIAL ULTRASOUND;  Surgeon: Collene Gobble, MD;  Location: [facility] ENDOSCOPY;  Service: Pulmonary;;    SOCIAL HISTORY: Social History   Socioeconomic History   Marital status: Single    Spouse name: Not on file   Number of children: 0   Years of education: 12   Highest education level: High school  graduate  Occupational History   Occupation: retired  Tobacco Use   Smoking status: Never   Smokeless tobacco: Never  Vaping Use   Vaping Use: Never used  Substance and Sexual Activity   Alcohol use: No   Drug use: Never   Sexual activity: Yes    Birth control/protection: Post-menopausal  Other Topics Concern   Not on file  Social History Narrative   Not on file   Social Determinants of Health   Financial Resource Strain: Not on file  Food Insecurity: Not on file  Transportation Needs: Not on file   Physical Activity: Not on file  Stress: Not on file  Social Connections: Not on file  Intimate Partner Violence: Not on file    FAMILY HISTORY: Family History  Problem Relation Age of Onset   Arthritis Mother    Breast cancer Sister    Cancer Sister        Breast Cancer   Diabetes Sister    Hyperlipidemia Sister    Hypertension Sister    Stroke Sister    Diabetes Brother    Hypertension Brother     Review of Systems  Constitutional:  Positive for fatigue. Negative for appetite change, chills, fever and unexpected weight change.  HENT:   Negative for hearing loss, lump/mass and trouble swallowing.   Eyes:  Negative for eye problems and icterus.  Respiratory:  Negative for chest tightness, cough and shortness of breath.   Cardiovascular:  Negative for chest pain, leg swelling and palpitations.  Gastrointestinal:  Negative for abdominal distention, abdominal pain, constipation, diarrhea, nausea and vomiting.  Endocrine: Negative for hot flashes.  Genitourinary:  Negative for difficulty urinating.   Musculoskeletal:  Negative for arthralgias.  Skin:  Negative for itching and rash.  Neurological:  Negative for dizziness, extremity weakness, headaches and numbness.  Hematological:  Negative for adenopathy. Does not bruise/bleed easily.  Psychiatric/Behavioral:  Negative for depression. The patient is not nervous/anxious.       PHYSICAL EXAMINATION  ECOG PERFORMANCE STATUS: 3 - Symptomatic, >50% confined to bed  Vitals:   11/16/21 1310  BP: (!) 97/46  Pulse: 94  Resp: 16  Temp: (!) 97 F (36.1 C)  SpO2: 96%    Physical Exam Constitutional:      General: She is not in acute distress.    Appearance: Normal appearance. She is not toxic-appearing.  HENT:     Head: Normocephalic and atraumatic.  Eyes:     General: No scleral icterus. Cardiovascular:     Rate and Rhythm: Normal rate and regular rhythm.     Pulses: Normal pulses.     Heart sounds: Normal heart  sounds.  Pulmonary:     Effort: Pulmonary effort is normal.     Breath sounds: Normal breath sounds.  Abdominal:     General: Abdomen is flat. Bowel sounds are normal. There is no distension.     Palpations: Abdomen is soft.     Tenderness: There is no abdominal tenderness.  Musculoskeletal:        General: No swelling.     Cervical back: Neck supple.  Lymphadenopathy:     Cervical: No cervical adenopathy.  Skin:    General: Skin is warm and dry.     Findings: No rash.  Neurological:     General: No focal deficit present.     Mental Status: She is alert.  Psychiatric:        Mood and Affect: Mood normal.  Behavior: Behavior normal.     LABORATORY DATA:  CBC    Component Value Date/Time   WBC 4.1 11/16/2021 1250   WBC 6.7 07/25/2021 0111   RBC 3.68 (L) 11/16/2021 1250   HGB 11.3 (L) 11/16/2021 1250   HGB 15.7 07/07/2021 1538   HCT 35.0 (L) 11/16/2021 1250   HCT 46.6 07/07/2021 1538   PLT 438 (H) 11/16/2021 1250   PLT 450 07/07/2021 1538   MCV 95.1 11/16/2021 1250   MCV 88 07/07/2021 1538   MCH 30.7 11/16/2021 1250   MCHC 32.3 11/16/2021 1250   RDW 16.5 (H) 11/16/2021 1250   RDW 12.9 07/07/2021 1538   LYMPHSABS 0.4 (L) 11/16/2021 1250   LYMPHSABS 1.6 07/07/2021 1538   MONOABS 0.7 11/16/2021 1250   EOSABS 0.2 11/16/2021 1250   EOSABS 0.1 07/07/2021 1538   BASOSABS 0.0 11/16/2021 1250   BASOSABS 0.0 07/07/2021 1538    CMP     Component Value Date/Time   NA 140 11/16/2021 1250   NA 141 07/07/2021 1538   K 2.9 (L) 11/16/2021 1250   CL 102 11/16/2021 1250   CO2 31 11/16/2021 1250   GLUCOSE 109 (H) 11/16/2021 1250   BUN 9 11/16/2021 1250   BUN 9 07/07/2021 1538   CREATININE 0.49 11/16/2021 1250   CALCIUM 9.3 11/16/2021 1250   PROT 6.6 11/16/2021 1250   PROT 7.9 07/07/2021 1538   ALBUMIN 3.4 (L) 11/16/2021 1250   ALBUMIN 4.6 07/07/2021 1538   AST 23 11/16/2021 1250   ALT 17 11/16/2021 1250   ALKPHOS 63 11/16/2021 1250   BILITOT 0.5 11/16/2021  1250   GFRNONAA >60 11/16/2021 1250   GFRAA 103 03/05/2020 1119         ASSESSMENT and THERAPY PLAN:   Malignant neoplasm of unspecified part of unspecified bronchus or lung ([place]) Jennifer Cowan is a 76 year old woman with both lung cancer and breast cancer.  She is status post chemoradiation for the lung cancer and will begin [place] today.  We discussed the risks and benefits in detail and I reviewed with her family to be on the look out for side effects including substantial fatigue or skin rashes or diarrhea.  Other abnormalities that can happen such as thyroid level changes or liver enzyme elevation will detect on labs.  They verbalized understanding of this.  I am hopeful that her breast tumor will continue to shrink as well as continue to treat her lung cancer.  She will return in 3 weeks for labs, follow-up with Dr. Chryl Heck, and her next treatment.   All questions were answered. The patient knows to call the clinic with any problems, questions or concerns. We can certainly see the patient much sooner if necessary.  Total encounter time:20 minutes*in face-to-face visit time, chart review, lab review, care coordination, order entry, and documentation of the encounter time.    Wilber Bihari, NP 11/16/21 3:52 PM Medical Oncology and Hematology [facility] [place], [place] [address] Tel. [phone]    Fax. [phone]  *Total Encounter Time as defined by the Centers for Medicare and Medicaid Services includes, in addition to the face-to-face time of a patient visit (documented in the note above) non-face-to-face time: obtaining and reviewing outside history, ordering and reviewing medications, tests or procedures, care coordination (communications with other health care professionals or caregivers) and documentation in the medical record.

## 2021-11-16 NOTE — Patient Instructions (Signed)
[facility] ONCOL[facility]  Discharge Instructions: Thank you for choosing [facility] to provide your oncology and hematology care.   If you have a lab appointment with the [facility], please go directly to the [facility] and check in at the registration area.   Wear comfortable clothing and clothing appropriate for easy access to any Portacath or PICC line.   We strive to give you quality time with your provider. You may need to reschedule your appointment if you arrive late (15 or more minutes).  Arriving late affects you and other patients whose appointments are after yours.  Also, if you miss three or more appointments without notifying the office, you may be dismissed from the clinic at the provider's discretion.      For prescription refill requests, have your pharmacy contact our office and allow 72 hours for refills to be completed.    Today you received the following chemotherapy and/or immunotherapy agents Keytruda      To help prevent nausea and vomiting after your treatment, we encourage you to take your nausea medication as directed.  BELOW ARE SYMPTOMS THAT SHOULD BE REPORTED IMMEDIATELY: *FEVER GREATER THAN 100.4 F (38 C) OR HIGHER *CHILLS OR SWEATING *NAUSEA AND VOMITING THAT IS NOT CONTROLLED WITH YOUR NAUSEA MEDICATION *UNUSUAL SHORTNESS OF BREATH *UNUSUAL BRUISING OR BLEEDING *URINARY PROBLEMS (pain or burning when urinating, or frequent urination) *BOWEL PROBLEMS (unusual diarrhea, constipation, pain near the anus) TENDERNESS IN MOUTH AND THROAT WITH OR WITHOUT PRESENCE OF ULCERS (sore throat, sores in mouth, or a toothache) UNUSUAL RASH, SWELLING OR PAIN  UNUSUAL VAGINAL DISCHARGE OR ITCHING   Items with * indicate a potential emergency and should be followed up as soon as possible or go to the Emergency Department if any problems should occur.  Please show the CHEMOTHERAPY ALERT CARD or IMMUNOTHERAPY ALERT CARD at check-in to  the Emergency Department and triage nurse.  Should you have questions after your visit or need to cancel or reschedule your appointment, please contact [facility]  Dept: [phone]  and follow the prompts.  Office hours are 8:00 a.m. to 4:30 p.m. Monday - Friday. Please note that voicemails left after 4:00 p.m. may not be returned until the following business day.  We are closed weekends and major holidays. You have access to a nurse at all times for urgent questions. Please call the main number to the clinic Dept: [phone] and follow the prompts.   For any non-urgent questions, you may also contact your provider using MyChart. We now offer e-Visits for anyone 31 and older to request care online for non-urgent symptoms. For details visit [email or web address].   Also download the MyChart app! Go to the app store, search "MyChart", open the app, select [facility], and log in with your MyChart username and password.  Masks are optional in the cancer centers. If you would like for your care team to wear a mask while they are taking care of you, please let them know. You may have one support person who is at least 76 years old accompany you for your appointments.  Pembrolizumab Injection What is this medication? PEMBROLIZUMAB (PEM broe LIZ ue mab) treats some types of cancer. It works by helping your immune system slow or stop the spread of cancer cells. It is a monoclonal antibody. This medicine may be used for other purposes; ask your health care provider or pharmacist if you have questions. COMMON BRAND NAME(S): Keytruda What  should I tell my care team before I take this medication? They need to know if you have any of these conditions: Allogeneic stem cell transplant (uses someone else's stem cells) Autoimmune diseases, such as Crohn disease, ulcerative colitis, lupus History of chest radiation Nervous system problems, such as Guillain-Barre syndrome,  myasthenia gravis Organ transplant An unusual or allergic reaction to pembrolizumab, other medications, foods, dyes, or preservatives Pregnant or trying to get pregnant Breast-feeding How should I use this medication? This medication is injected into a vein. It is given by your care team in a hospital or clinic setting. A special MedGuide will be given to you before each treatment. Be sure to read this information carefully each time. Talk to your care team about the use of this medication in children. While it may be prescribed for children as young as 6 months for selected conditions, precautions do apply. Overdosage: If you think you have taken too much of this medicine contact a poison control center or emergency room at once. NOTE: This medicine is only for you. Do not share this medicine with others. What if I miss a dose? Keep appointments for follow-up doses. It is important not to miss your dose. Call your care team if you are unable to keep an appointment. What may interact with this medication? Interactions have not been studied. This list may not describe all possible interactions. Give your health care provider a list of all the medicines, herbs, non-prescription drugs, or dietary supplements you use. Also tell them if you smoke, drink alcohol, or use illegal drugs. Some items may interact with your medicine. What should I watch for while using this medication? Your condition will be monitored carefully while you are receiving this medication. You may need blood work while taking this medication. This medication may cause serious skin reactions. They can happen weeks to months after starting the medication. Contact your care team right away if you notice fevers or flu-like symptoms with a rash. The rash may be red or purple and then turn into blisters or peeling of the skin. You may also notice a red rash with swelling of the face, lips, or lymph nodes in your neck or under your  arms. Tell your care team right away if you have any change in your eyesight. Talk to your care team if you may be pregnant. Serious birth defects can occur if you take this medication during pregnancy and for 4 months after the last dose. You will need a negative pregnancy test before starting this medication. Contraception is recommended while taking this medication and for 4 months after the last dose. Your care team can help you find the option that works for you. Do not breastfeed while taking this medication and for 4 months after the last dose. What side effects may I notice from receiving this medication? Side effects that you should report to your care team as soon as possible: Allergic reactions--skin rash, itching, hives, swelling of the face, lips, tongue, or throat Dry cough, shortness of breath or trouble breathing Eye pain, redness, irritation, or discharge with blurry or decreased vision Heart muscle inflammation--unusual weakness or fatigue, shortness of breath, chest pain, fast or irregular heartbeat, dizziness, swelling of the ankles, feet, or hands Hormone gland problems--headache, sensitivity to light, unusual weakness or fatigue, dizziness, fast or irregular heartbeat, increased sensitivity to cold or heat, excessive sweating, constipation, hair loss, increased thirst or amount of urine, tremors or shaking, irritability Infusion reactions--chest pain, shortness of breath  or trouble breathing, feeling faint or lightheaded Kidney injury (glomerulonephritis)--decrease in the amount of urine, red or dark brown urine, foamy or bubbly urine, swelling of the ankles, hands, or feet Liver injury--right upper belly pain, loss of appetite, nausea, light-colored stool, dark yellow or brown urine, yellowing skin or eyes, unusual weakness or fatigue Pain, tingling, or numbness in the hands or feet, muscle weakness, change in vision, confusion or trouble speaking, loss of balance or  coordination, trouble walking, seizures Rash, fever, and swollen lymph nodes Redness, blistering, peeling, or loosening of the skin, including inside the mouth Sudden or severe stomach pain, bloody diarrhea, fever, nausea, vomiting Side effects that usually do not require medical attention (report to your care team if they continue or are bothersome): Bone, joint, or muscle pain Diarrhea Fatigue Loss of appetite Nausea Skin rash This list may not describe all possible side effects. Call your doctor for medical advice about side effects. You may report side effects to FDA at 1-800-FDA-1088. Where should I keep my medication? This medication is given in a hospital or clinic. It will not be stored at home. NOTE: This sheet is a summary. It may not cover all possible information. If you have questions about this medicine, talk to your doctor, pharmacist, or health care provider.  2023 Elsevier/Gold Standard (2012-09-24 00:00:00)

## 2021-11-16 NOTE — Telephone Encounter (Addendum)
-----   Message from Benay Pike, MD sent at 11/16/2021  3:39 PM EDT ----- Potassium is low. I didn't see any potassium in her med list. If she can tolerate oral potassium, may be a good idea to do Kcl 20 meq daily. Can you may be talk to her.  Thanks,  This RN called and spoke with [name] the pt's niece and caregiver- verified pt is not currently taking potassium nor does she have it on hand.  Pharmacy verified and prescription sent per MD request.

## 2021-11-17 ENCOUNTER — Telehealth: Payer: Self-pay

## 2021-11-17 NOTE — Telephone Encounter (Signed)
Niece [name] stes that Jennifer Cowan is doing fine.  She is eating, drinking, and urinating well. Family knows to call the office at [phone] if they have any questions or concerns.

## 2021-11-17 NOTE — Telephone Encounter (Signed)
-----   Message from Rolene Course, RN sent at 11/16/2021  3:29 PM EDT ----- Regarding: [place] 1st Tx F/U call - Jennifer Cowan [place] 1st Tx F/U call - Keytruda.  Patient tolerated well.

## 2021-11-18 LAB — T4: T4, Total: 8 ug/dL (ref 4.5–12.0)

## 2021-11-22 ENCOUNTER — Other Ambulatory Visit: Payer: Self-pay | Admitting: Radiation Therapy

## 2021-11-22 DIAGNOSIS — C7931 Secondary malignant neoplasm of brain: Secondary | ICD-10-CM

## 2021-11-22 NOTE — Progress Notes (Signed)
Orders entered for port access and de-access the day of brain Mri at [facility].   Mont Dutton R.T.(R)(T) Radiation Special Procedures Navigator

## 2021-11-23 ENCOUNTER — Encounter: Payer: Self-pay | Admitting: Radiation Oncology

## 2021-11-24 ENCOUNTER — Ambulatory Visit
Admission: RE | Admit: 2021-11-24 | Discharge: 2021-11-24 | Disposition: A | Discharge status: Home / Self Care | Payer: Medicare Other | Source: Ambulatory Visit | Attending: Radiation Oncology | Admitting: Radiation Oncology

## 2021-11-24 DIAGNOSIS — C7931 Secondary malignant neoplasm of brain: Secondary | ICD-10-CM

## 2021-11-24 MED ORDER — HEPARIN SOD (PORK) LOCK FLUSH 100 UNIT/ML IV SOLN
500.0000 [IU] | Freq: Once | INTRAVENOUS | Status: AC
  Administered 2021-11-24: 500 [IU] via INTRAVENOUS

## 2021-11-24 MED ORDER — GADOPICLENOL 0.5 MMOL/ML IV SOLN
8.0000 mL | Freq: Once | INTRAVENOUS | Status: AC | PRN
  Administered 2021-11-24: 8 mL via INTRAVENOUS

## 2021-11-24 MED ORDER — SODIUM CHLORIDE 0.9% FLUSH
10.0000 mL | INTRAVENOUS | Status: DC | PRN
  Administered 2021-11-24: 10 mL via INTRAVENOUS

## 2021-11-25 ENCOUNTER — Encounter: Payer: Self-pay | Admitting: Hematology and Oncology

## 2021-11-25 NOTE — Telephone Encounter (Signed)
This RN contacted the patient's niece- Jennifer Cowan per her My chart message sent to radiation oncology stating "There is a rash, swelling and pain on right side of the back. The pain started Sunday and rash appeared today "  Per phone discussion Jennifer Cowan states they have been using the cream from when pt was getting chest radiation. She is asking what else can they use.  This RN stated need for assessment of rash for best recommendation- and asked for better description- including primary concern for shingles- Jennifer Cowan states she has seen shingles rash before and this does not look the same.  Rash is not as raised but is red and has scabs.  Rash has not gotten worse since presentation.  This RN asked if Jennifer Cowan could take a picture and send it attached to a My Chart message addressed to Dr Chryl Heck.  Jennifer Cowan is presently at work and will obtain and send later today.  In the meantime discussed possible benefit from use of benadryl cream mixed with hydrocortisone.

## 2021-11-29 ENCOUNTER — Encounter: Payer: Self-pay | Admitting: Radiation Oncology

## 2021-11-29 ENCOUNTER — Ambulatory Visit
Admission: RE | Admit: 2021-11-29 | Discharge: 2021-11-29 | Disposition: A | Discharge status: Home / Self Care | Payer: Medicare Other | Source: Ambulatory Visit | Attending: Radiation Oncology | Admitting: Radiation Oncology

## 2021-11-29 ENCOUNTER — Other Ambulatory Visit: Payer: Self-pay | Admitting: Radiation Therapy

## 2021-11-29 DIAGNOSIS — C50919 Malignant neoplasm of unspecified site of unspecified female breast: Secondary | ICD-10-CM

## 2021-11-29 DIAGNOSIS — C7931 Secondary malignant neoplasm of brain: Secondary | ICD-10-CM

## 2021-11-29 DIAGNOSIS — C3412 Malignant neoplasm of upper lobe, left bronchus or lung: Secondary | ICD-10-CM | POA: Diagnosis not present

## 2021-11-29 DIAGNOSIS — C3490 Malignant neoplasm of unspecified part of unspecified bronchus or lung: Secondary | ICD-10-CM

## 2021-11-29 DIAGNOSIS — C801 Malignant (primary) neoplasm, unspecified: Secondary | ICD-10-CM | POA: Diagnosis not present

## 2021-11-29 NOTE — Progress Notes (Signed)
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 I verified patient's identity and began nursing interview. Patient reports tender rash on RUQ of back, that is improving. No other issues reported at this time.   Meaningful use complete.   Patient aware of their 1:00pm-11/29/2021 telephone appointment w/ Shona Simpson to receive most recent MRI results. I left my extension [phone] in case patient needs anything. Patient verbalized understanding. This concludes the nursing interview.   Patient contact [phone]     Leandra Kern, LPN

## 2021-11-29 NOTE — Progress Notes (Addendum)
Patient was not available for this call. A voicemail to return my call was left w/ my extension [phone].  Leandra Kern, LPN

## 2021-11-29 NOTE — Progress Notes (Signed)
Radiation Oncology         (336) [phone] ________________________________   Outpatient Follow Up- Conducted via telephone at patient request.  I spoke with the patient to conduct this consult visit via telephone. The patient was notified in advance and was offered an in person or telemedicine meeting to allow for face to face communication but instead preferred to proceed with a telephone visit.   Radiation Oncology         (336) [phone] ________________________________  Name: Jennifer Cowan        MRN: [id number]  Date of Service: 11/29/2021 DOB: July 31, 1945  HA:LPFXTK, Arman Bogus, FNP  Dwyane Dee, MD     REFERRING PHYSICIAN: Dwyane Dee, MD   DIAGNOSIS: The primary encounter diagnosis was Metastasis to brain [facility]). 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   HISTORY OF PRESENT ILLNESS: Jennifer Cowan is a 76 y.o. 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  She was seen in the hospital setting after presenting with confusion.  She had 6 lesions that were treated with stereotactic radiosurgery, and following this went on to receive a course of chemoradiation as her disease was felt to be minimal in tumor burden.  She is receiving consolidative immunotherapy at this time.  Since her breathing treatment she has done well and was able to taper off of her steroids.  Her most recent MRI was performed on 11/24/2021.  She could not tolerate postcontrast imaging so she is scheduled to go back on Wednesday to have this portion of the exam completed.  Our discussion and brain oncology conference this morning however was that the  images thus far appeared to be reassuring.  Her niece Jennifer Cowan is contacted to review these results.  PREVIOUS RADIATION THERAPY: Yes   08/30/2021 through 10/15/2021 Site Technique Total Dose (Gy) Dose per Fx (Gy) Completed Fx Beam Energies  Lung, Left: Lung_L 3D 60/60 2 30/30 6X, 10X  Lung, Left: Lung_L_Bst 3D 6/6 2 3/3 6X, 10X   08/16/2021 through 08/20/2021 SRS Treatment was administered with IMRT technique with 6XFFF beam energy to the following sites and doses:   PTV_1_Rtemp_54 mm received 30 Gy in 5 fractions  PTV_2_Lparafal_36 mm received 27 Gy in 3 fractions PTV_3_Ltemp_3 mm received 20 Gy in 1 fraction PTV_4_Loccipit_71m received 20 Gy in 1 fraction PTV_5_Rtemp_538mreceived 30 Gy in 5 fractions PTV_6_Loccipit_2m64meceived 20 Gy in 1 fraction   PAST MEDICAL HISTORY:  Past Medical History:  Diagnosis Date   Anemia    Arthritis    [place] cancer [place]   Breast Cancer   Hyperlipidemia    Hypertension    Osteopenia 01/12/2021       PAST SURGICAL HISTORY: Past Surgical History:  Procedure Laterality Date   BREAST SURGERY  1999   Masectomy- left   BRONCHIAL BIOPSY  07/30/2021   Procedure: BRONCHIAL BIOPSIES;  Surgeon: ByrCollene GobbleD;  [facility];  Service: Pulmonary;;   BRONCHIAL BRUSHINGS  07/30/2021   Procedure: BRONCHIAL BRUSHINGS;  Surgeon: ByrCollene GobbleD;  [facility];  Service: Pulmonary;;   BRONCHIAL NEEDLE ASPIRATION BIOPSY  07/30/2021   Procedure:  BRONCHIAL NEEDLE ASPIRATION BIOPSIES;  Surgeon: Collene Gobble, MD;  Location: [facility]OS[facility];  Service: Pulmonary;;   CYSTECTOMY     cyst removed off top of head   IR IMAGING GUIDED PORT INSERTION  08/20/2021   MASTECTOMY     VIDEO BRONCHOSCOPY WITH RADIAL ENDOBRONCHIAL ULTRASOUND  07/30/2021   Procedure: VIDEO BRONCHOSCOPY WITH RADIAL ENDOBRONCHIAL ULTRASOUND;  Surgeon: Collene Gobble, MD;  Location: [facility] END[facility];  Service: Pulmonary;;     FAMILY HISTORY:   Family History  Problem Relation Age of Onset   Arthritis Mother    Breast cancer Sister    Cancer Sister        Breast Cancer   Diabetes Sister    Hyperlipidemia Sister    Hypertension Sister    Stroke Sister    Diabetes Brother    Hypertension Brother      SOCIAL HISTORY:  reports that she has never smoked. She has never used smokeless tobacco. She reports that she does not drink alcohol and does not use drugs. The patient is single.  She lives in [place].  Her daughter Jennifer Cowan is her next of kin.    ALLERGIES: Patient has no known allergies.   MEDICATIONS:  Current Outpatient Medications  Medication Sig Dispense Refill   Accu-Chek Softclix Lancets lancets Use as directed 4 times a day 100 each 1   acetaminophen (TYLENOL) 500 MG tablet Take 1,000 mg by mouth every 6 (six) hours as needed for moderate pain.     amLODipine (NORVASC) 10 MG tablet TAKE 1 TABLET BY MOUTH DAILY 90 tablet 3   atorvastatin (LIPITOR) 80 MG tablet Take 1 tablet (80 mg total) by mouth daily. 90 tablet 3   benazepril (LOTENSIN) 10 MG tablet TAKE 1 TABLET BY MOUTH DAILY 90 tablet 3   blood glucose meter kit and supplies KIT Dispense based on patient and insurance preference. Use up to four times daily as directed. 1 each 0   Blood Glucose Monitoring Suppl (ACCU-CHEK GUIDE) w/Device KIT Use as directed 4 times a day 1 kit 0   Cholecalciferol (VITAMIN D3) 125 MCG (5000 UT) CAPS Take 5,000 Units by mouth daily.     dexamethasone (DECADRON) 4 MG tablet Take 1 tablet (4 mg total) by mouth 3 (three) times daily. 90 tablet 1   ezetimibe (ZETIA) 10 MG tablet Take 1 tablet (10 mg total) by mouth daily. 90 tablet 3   glucose 4 GM chewable tablet Chew 1 tablet (4 g total) by mouth as needed for low blood sugar. 50 tablet 0   glucose blood (ACCU-CHEK GUIDE) test strip Use as directed 4 times a day 100 each 1   insulin glargine (LANTUS SOLOSTAR) 100 UNIT/ML Solostar Pen Inject 8 Units into the skin at bedtime. 15  mL 1   Insulin Pen Needle 31G X 5 MM MISC 1 each by Does not apply route daily. 90 each 1   insulin regular (NOVOLIN R) 100 units/mL injection If blood sugar is 200 or less: 0 units 201-250: 2 units 251-300: 4 units 301-350: 6 units 351-400: 8 units 401 or higher: 10 units and call MD 10 mL 1   Insulin Syringe-Needle U-100 (INSULIN SYRINGE .3CC/29GX1") 29G X 1" 0.3 ML MISC 1 Dose by Does not apply route 3 (three) times daily before meals. 90 each 1   LORazepam (ATIVAN) 0.5 MG tablet 1 tab po 30 minutes prior to radiation or MRI scans 30 tablet 0   meloxicam (MOBIC) 7.5 MG tablet TAKE 1  TABLET BY MOUTH DAILY 90 tablet 3   potassium chloride (MICRO-K) 10 MEQ CR capsule Take 2 capsules (20 mEq total) by mouth daily. 60 capsule 1   prochlorperazine (COMPAZINE) 10 MG tablet Take 1 tablet (10 mg total) by mouth every 6 (six) hours as needed for nausea or vomiting. 30 tablet 1   sucralfate (CARAFATE) 1 g tablet Take 1 tablet (1 g total) by mouth 4 (four) times daily -  with meals and at bedtime. Crush 1 tablet in 1 oz water and drink 5 min before meals for radiation induced esophagitis 120 tablet 2   trolamine salicylate (ASPERCREME) 10 % cream Apply 1 Application topically daily as needed for muscle pain.     No current facility-administered medications for this encounter.   Facility-Administered Medications Ordered in Other Encounters  Medication Dose Route Frequency Provider Last Rate Last Admin   acetaminophen (TYLENOL) 325 MG tablet            cyanocobalamin (VITAMIN B12) 1000 MCG/ML injection            diphenhydrAMINE (BENADRYL) 25 mg capsule              REVIEW OF SYSTEMS: On review of systems, the patient's neice reports that her aunt has been doing well without evidence of headaches, no complaints of verbal or speech changes or episodes of confusion have been noted.  She did develop a rash that was felt to be related to her immunotherapy and has been using topical over-the-counter cream.  No  other complaints are verbalized.     PHYSICAL EXAM:  Unable to assess due to encounter type.    ECOG = 1  0 - Asymptomatic (Fully active, able to carry on all predisease activities without restriction)  1 - Symptomatic but completely ambulatory (Restricted in physically strenuous activity but ambulatory and able to carry out work of a light or sedentary nature. For example, light housework, office work)  2 - Symptomatic, <50% in bed during the day (Ambulatory and capable of all self care but unable to carry out any work activities. Up and about more than 50% of waking hours)  3 - Symptomatic, >50% in bed, but not bedbound (Capable of only limited self-care, confined to bed or chair 50% or more of waking hours)  4 - Bedbound (Completely disabled. Cannot carry on any self-care. Totally confined to bed or chair)  5 - Death   Eustace Pen MM, Creech RH, Tormey DC, et al. [phone]). "Toxicity and response criteria of the [facility]". [place] Oncol. 5 (6): 649-55    LABORATORY DATA:  Lab Results  Component Value Date   WBC 4.1 11/16/2021   HGB 11.3 (L) 11/16/2021   HCT 35.0 (L) 11/16/2021   MCV 95.1 11/16/2021   PLT 438 (H) 11/16/2021   Lab Results  Component Value Date   NA 140 11/16/2021   K 2.9 (L) 11/16/2021   CL 102 11/16/2021   CO2 31 11/16/2021   Lab Results  Component Value Date   ALT 17 11/16/2021   AST 23 11/16/2021   ALKPHOS 63 11/16/2021   BILITOT 0.5 11/16/2021      RADIOGRAPHY: No results found.     IMPRESSION/PLAN: 1. Stage IV, poorly differentiated NSCLC of the LUL and synchronous cT3N0M0, grade 3, triple negative invasive ductal carcinoma of the right breast with a remote history of 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   We will await the second half  of her MRI scan which is to be performed on Wednesday.  I will touch base with the patient's niece Jennifer Cowan who is her next of kin and surrogate decision  maker to confirm stable findings.  If all is felt to be stable we would repeat her next MRI in 3 months time.  [name] is in agreement with this plan.  She will otherwise continue under the care of medical oncology with systemic immunotherapy.    This encounter was conducted via telephone.  The patient has provided two factor identification and has given verbal consent for this type of encounter and has been advised to only accept a meeting of this type in a secure network environment. The time spent during this encounter was 35 minutes including preparation, discussion, and coordination of the patient's care. The attendants for this meeting include  Hayden Pedro  and her niece Jennifer Cowan. During the encounter,   Hayden Pedro was located at [facility] Radiation Oncology Department.  [name] was located at home unable to participate but I spoke with her niece Jennifer Cowan.Carola Rhine, [facility]   **Disclaimer: This note was dictated with voice recognition software. Similar sounding words can inadvertently be transcribed and this note may contain transcription errors which may not have been corrected upon publication of note.**

## 2021-11-29 NOTE — Addendum Note (Signed)
Encounter addended by: Mollie Germany, LPN on: 41/74/0814 4:81 PM  Actions taken: Vitals modified, Medication List reviewed, Problem List reviewed, Allergies reviewed, Flowsheet accepted, Clinical Note Signed

## 2021-11-30 ENCOUNTER — Other Ambulatory Visit: Payer: Self-pay | Admitting: Nurse Practitioner

## 2021-11-30 ENCOUNTER — Inpatient Hospital Stay: Payer: Medicare Other | Attending: Hematology and Oncology | Admitting: Nurse Practitioner

## 2021-11-30 DIAGNOSIS — Z79899 Other long term (current) drug therapy: Secondary | ICD-10-CM | POA: Insufficient documentation

## 2021-11-30 DIAGNOSIS — C3490 Malignant neoplasm of unspecified part of unspecified bronchus or lung: Secondary | ICD-10-CM | POA: Insufficient documentation

## 2021-11-30 DIAGNOSIS — C7931 Secondary malignant neoplasm of brain: Secondary | ICD-10-CM | POA: Insufficient documentation

## 2021-11-30 DIAGNOSIS — Z5111 Encounter for antineoplastic chemotherapy: Secondary | ICD-10-CM | POA: Insufficient documentation

## 2021-12-01 ENCOUNTER — Other Ambulatory Visit (HOSPITAL_COMMUNITY): Payer: Self-pay

## 2021-12-01 ENCOUNTER — Other Ambulatory Visit: Payer: Self-pay

## 2021-12-01 DIAGNOSIS — C7931 Secondary malignant neoplasm of brain: Secondary | ICD-10-CM | POA: Diagnosis not present

## 2021-12-01 DIAGNOSIS — C50919 Malignant neoplasm of unspecified site of unspecified female breast: Secondary | ICD-10-CM | POA: Diagnosis not present

## 2021-12-01 DIAGNOSIS — G9389 Other specified disorders of brain: Secondary | ICD-10-CM | POA: Diagnosis not present

## 2021-12-01 DIAGNOSIS — R918 Other nonspecific abnormal finding of lung field: Secondary | ICD-10-CM | POA: Diagnosis not present

## 2021-12-01 DIAGNOSIS — R5381 Other malaise: Secondary | ICD-10-CM | POA: Diagnosis not present

## 2021-12-01 MED ORDER — GADOPICLENOL 0.5 MMOL/ML IV SOLN: 7.5000 mL | Freq: Once | INTRAVENOUS | Status: AC | PRN

## 2021-12-01 MED ORDER — ACCU-CHEK SOFTCLIX LANCETS MISC
1 refills | Status: DC
  Filled 2021-12-01: qty 100, 25d supply, fill #0
  Filled 2022-01-06: qty 100, 25d supply, fill #1

## 2021-12-01 MED ORDER — HEPARIN SOD (PORK) LOCK FLUSH 100 UNIT/ML IV SOLN: 500.0000 [IU] | Freq: Once | INTRAVENOUS | Status: DC

## 2021-12-01 MED ORDER — SODIUM CHLORIDE 0.9% FLUSH: 10.0000 mL | Freq: Once | INTRAVENOUS | Status: DC

## 2021-12-01 NOTE — Addendum Note (Signed)
Encounter addended by: Hayden Pedro, PA-C on: 12/01/2021 3:46 PM  Actions taken: Clinical Note Signed

## 2021-12-06 ENCOUNTER — Telehealth: Payer: Self-pay | Admitting: Radiation Oncology

## 2021-12-06 DIAGNOSIS — C7931 Secondary malignant neoplasm of brain: Secondary | ICD-10-CM

## 2021-12-06 DIAGNOSIS — C50919 Malignant neoplasm of unspecified site of unspecified female breast: Secondary | ICD-10-CM

## 2021-12-06 DIAGNOSIS — C3490 Malignant neoplasm of unspecified part of unspecified bronchus or lung: Secondary | ICD-10-CM

## 2021-12-06 NOTE — Telephone Encounter (Signed)
I called the patient's niece and discussed our review her Ms. Osso's MRI and rationale to meet with Dr. Mickeal Skinner for follow up on vascular changes, but no new brain disease was noted. He will see her next week in clinic.

## 2021-12-06 NOTE — Telephone Encounter (Signed)
I called the patient's niece Jennifer Cowan back to let her know that Jennifer Cowan is also a candidate for genetic testing given her original diagnosis of breast cancer and with her history of metastatic disease. This could also impact other family members and their testing needs and possibly screening if positive for mutation. She is in agreement to a referral with genetics. A new request will be made and can be performed their convenience.

## 2021-12-07 ENCOUNTER — Other Ambulatory Visit: Payer: Medicare Other

## 2021-12-07 ENCOUNTER — Ambulatory Visit: Payer: Medicare Other | Admitting: Hematology and Oncology

## 2021-12-07 ENCOUNTER — Ambulatory Visit: Payer: Medicare Other

## 2021-12-13 ENCOUNTER — Ambulatory Visit: Payer: Medicare Other | Admitting: Internal Medicine

## 2021-12-16 ENCOUNTER — Inpatient Hospital Stay: Payer: Medicare Other

## 2021-12-16 ENCOUNTER — Inpatient Hospital Stay (HOSPITAL_BASED_OUTPATIENT_CLINIC_OR_DEPARTMENT_OTHER): Payer: Medicare Other | Admitting: Hematology and Oncology

## 2021-12-16 ENCOUNTER — Encounter: Payer: Self-pay | Admitting: Hematology and Oncology

## 2021-12-16 ENCOUNTER — Inpatient Hospital Stay (HOSPITAL_BASED_OUTPATIENT_CLINIC_OR_DEPARTMENT_OTHER): Payer: Medicare Other | Admitting: Internal Medicine

## 2021-12-16 ENCOUNTER — Other Ambulatory Visit: Payer: Self-pay

## 2021-12-16 VITALS — BP 106/58 | HR 105 | Temp 98.2°F | Resp 20

## 2021-12-16 VITALS — BP 99/61 | HR 108 | Temp 97.0°F | Resp 18 | Wt 157.1 lb

## 2021-12-16 DIAGNOSIS — C3490 Malignant neoplasm of unspecified part of unspecified bronchus or lung: Secondary | ICD-10-CM

## 2021-12-16 DIAGNOSIS — Z5111 Encounter for antineoplastic chemotherapy: Secondary | ICD-10-CM | POA: Diagnosis not present

## 2021-12-16 DIAGNOSIS — C7931 Secondary malignant neoplasm of brain: Secondary | ICD-10-CM | POA: Diagnosis not present

## 2021-12-16 DIAGNOSIS — I639 Cerebral infarction, unspecified: Secondary | ICD-10-CM | POA: Diagnosis not present

## 2021-12-16 DIAGNOSIS — Z79899 Other long term (current) drug therapy: Secondary | ICD-10-CM | POA: Diagnosis not present

## 2021-12-16 DIAGNOSIS — R739 Hyperglycemia, unspecified: Secondary | ICD-10-CM

## 2021-12-16 DIAGNOSIS — Z95828 Presence of other vascular implants and grafts: Secondary | ICD-10-CM

## 2021-12-16 LAB — CMP (CANCER CENTER ONLY)
ALT: 12 U/L (ref 0–44)
AST: 12 U/L — ABNORMAL LOW (ref 15–41)
Albumin: 3.9 g/dL (ref 3.5–5.0)
Alkaline Phosphatase: 72 U/L (ref 38–126)
Anion gap: 9 (ref 5–15)
BUN: 12 mg/dL (ref 8–23)
CO2: 25 mmol/L (ref 22–32)
Calcium: 10.4 mg/dL — ABNORMAL HIGH (ref 8.9–10.3)
Chloride: 105 mmol/L (ref 98–111)
Creatinine: 0.48 mg/dL (ref 0.44–1.00)
GFR, Estimated: >60 mL/min (ref >60)
Glucose, Bld: 91 mg/dL (ref 70–99)
Potassium: 4.2 mmol/L (ref 3.5–5.1)
Sodium: 139 mmol/L (ref 135–145)
Total Bilirubin: 0.3 mg/dL (ref 0.3–1.2)
Total Protein: 7.4 g/dL (ref 6.5–8.1)

## 2021-12-16 LAB — CBC WITH DIFFERENTIAL (CANCER CENTER ONLY)
Abs Immature Granulocytes: 0.03 10*3/uL (ref 0.00–0.07)
Basophils Absolute: 0 10*3/uL (ref 0.0–0.1)
Basophils Relative: 1 %
Eosinophils Absolute: 0.2 10*3/uL (ref 0.0–0.5)
Eosinophils Relative: 4 %
HCT: 38.3 % (ref 36.0–46.0)
Hemoglobin: 12.2 g/dL (ref 12.0–15.0)
Immature Granulocytes: 1 %
Lymphocytes Relative: 9 %
Lymphs Abs: 0.5 10*3/uL — ABNORMAL LOW (ref 0.7–4.0)
MCH: 30.3 pg (ref 26.0–34.0)
MCHC: 31.9 g/dL (ref 30.0–36.0)
MCV: 95 fL (ref 80.0–100.0)
Monocytes Absolute: 0.8 10*3/uL (ref 0.1–1.0)
Monocytes Relative: 16 %
Neutro Abs: 3.7 10*3/uL (ref 1.7–7.7)
Neutrophils Relative %: 69 %
Platelet Count: 477 10*3/uL — ABNORMAL HIGH (ref 150–400)
RBC: 4.03 MIL/uL (ref 3.87–5.11)
RDW: 14.4 % (ref 11.5–15.5)
WBC Count: 5.2 10*3/uL (ref 4.0–10.5)
nRBC: 0 % (ref 0.0–0.2)

## 2021-12-16 MED ORDER — SODIUM CHLORIDE 0.9 % IV SOLN
200.0000 mg | Freq: Once | INTRAVENOUS | Status: AC
  Administered 2021-12-16: 200 mg via INTRAVENOUS
  Filled 2021-12-16: qty 200

## 2021-12-16 MED ORDER — SODIUM CHLORIDE 0.9% FLUSH
10.0000 mL | Freq: Once | INTRAVENOUS | Status: AC
  Administered 2021-12-16: 10 mL

## 2021-12-16 MED ORDER — VALACYCLOVIR HCL 500 MG PO TABS: 500.0000 mg | ORAL_TABLET | Freq: Two times a day (BID) | ORAL | 0 refills | Status: DC

## 2021-12-16 MED ORDER — SODIUM CHLORIDE 0.9 % IV SOLN: Freq: Once | INTRAVENOUS | Status: AC

## 2021-12-16 MED ORDER — ASPIRIN 325 MG PO TBEC: 325.0000 mg | DELAYED_RELEASE_TABLET | Freq: Every day | ORAL | 2 refills | Status: DC

## 2021-12-16 MED ORDER — LIDOCAINE 5 % EX PTCH: 1.0000 | MEDICATED_PATCH | CUTANEOUS | 0 refills | Status: DC

## 2021-12-16 MED ORDER — SODIUM CHLORIDE 0.9% FLUSH
10.0000 mL | INTRAVENOUS | Status: DC | PRN
  Administered 2021-12-16: 10 mL

## 2021-12-16 MED ORDER — HEPARIN SOD (PORK) LOCK FLUSH 100 UNIT/ML IV SOLN
500.0000 [IU] | Freq: Once | INTRAVENOUS | Status: AC | PRN
  Administered 2021-12-16: 500 [IU]

## 2021-12-16 NOTE — Progress Notes (Signed)
[facility] Follow up:    Jennifer Cowan, [place] [id number]   DIAGNOSIS:  Cancer Staging  No matching staging information was found for the patient.  SUMMARY OF ONCOLOGIC HISTORY: Oncology History  Malignant neoplasm of unspecified part of unspecified bronchus or lung ([place])  08/13/2021 Initial Diagnosis   Malignant neoplasm of unspecified part of unspecified bronchus or lung ([place])   08/30/2021 - 09/13/2021 Chemotherapy   Patient is on Treatment Plan : LUNG Carboplatin / Paclitaxel + XRT q7d     08/30/2021 - 10/11/2021 Chemotherapy   Patient is on Treatment Plan : LUNG Carboplatin + Paclitaxel + XRT q7d     11/16/2021 -  Chemotherapy   Patient is on Treatment Plan : LUNG Pembrolizumab Q 21D       CURRENT THERAPY: Pembrolizumab  INTERVAL HISTORY:  Jennifer Cowan 76 y.o. female returns for follow-up of her breast and lung cancer.   She is on maintenance immunotherapy. She is tolerating it very well except for shingles like rash which is resolving. No cough, chest pain, SOB. No change in bowel habits or urinary habits. She is eating well. She is following up with Dr Mickeal Skinner, plan is to an MRA to follow up on the ? Lesions. 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  MEDICAL HISTORY: Past Medical History:  Diagnosis Date   Anemia    Arthritis    Breast cancer ([place])    Cancer ([place]) 1999   Breast Cancer   Hyperlipidemia    Hypertension    Osteopenia 01/12/2021    SURGICAL HISTORY: Past Surgical History:  Procedure Laterality Date   BREAST SURGERY  1999   Masectomy- left   BRONCHIAL BIOPSY  07/30/2021   Procedure: BRONCHIAL BIOPSIES;  Surgeon: Collene Gobble, MD;  Location: [facility] ENDOSCOPY;  Service: Pulmonary;;   BRONCHIAL BRUSHINGS  07/30/2021   Procedure: BRONCHIAL BRUSHINGS;  Surgeon: Collene Gobble, MD;  Location: [facility] ENDOSCOPY;  Service: Pulmonary;;   BRONCHIAL NEEDLE ASPIRATION BIOPSY  07/30/2021   Procedure: BRONCHIAL NEEDLE ASPIRATION BIOPSIES;  Surgeon: Collene Gobble, MD;  Location: [facility] ENDOSCOPY;  Service: Pulmonary;;   CYSTECTOMY     cyst removed off top of head   IR IMAGING GUIDED PORT INSERTION  08/20/2021   MASTECTOMY     VIDEO BRONCHOSCOPY WITH RADIAL ENDOBRONCHIAL ULTRASOUND  07/30/2021   Procedure: VIDEO BRONCHOSCOPY WITH RADIAL ENDOBRONCHIAL ULTRASOUND;  Surgeon: Collene Gobble, MD;  Location: [facility] ENDOSCOPY;  Service: Pulmonary;;    SOCIAL HISTORY: Social History   Socioeconomic History   Marital status: Single    Spouse name: Not on file  Number of children: 0   Years of education: 12   Highest education level: High school graduate  Occupational History   Occupation: retired  Tobacco Use   Smoking status: Never   Smokeless tobacco: Never  Vaping Use   Vaping Use: Never used  Substance and Sexual Activity   Alcohol use: No   Drug use: Never   Sexual activity: Yes    Birth control/protection: Post-menopausal  Other Topics Concern   Not on file  Social History Narrative   Not on file   Social Determinants of Health    Financial Resource Strain: Not on file  Food Insecurity: Not on file  Transportation Needs: Not on file  Physical Activity: Not on file  Stress: Not on file  Social Connections: Not on file  Intimate Partner Violence: Not on file    FAMILY HISTORY: Family History  Problem Relation Age of Onset   Arthritis Mother    Breast cancer Sister    Cancer Sister        Breast Cancer   Diabetes Sister    Hyperlipidemia Sister    Hypertension Sister    Stroke Sister    Diabetes Brother    Hypertension Brother     Review of Systems  Constitutional:  Positive for fatigue. Negative for appetite change, chills, fever and unexpected weight change.  HENT:   Negative for hearing loss, lump/mass and trouble swallowing.   Eyes:  Negative for eye problems and icterus.  Respiratory:  Negative for chest tightness, cough and shortness of breath.   Cardiovascular:  Negative for chest pain, leg swelling and palpitations.  Gastrointestinal:  Negative for abdominal distention, abdominal pain, constipation, diarrhea, nausea and vomiting.  Endocrine: Negative for hot flashes.  Genitourinary:  Negative for difficulty urinating.   Musculoskeletal:  Negative for arthralgias.  Skin:  Positive for rash. Negative for itching.  Neurological:  Negative for dizziness, extremity weakness, headaches and numbness.  Hematological:  Negative for adenopathy. Does not bruise/bleed easily.  Psychiatric/Behavioral:  Negative for depression. The patient is not nervous/anxious.       PHYSICAL EXAMINATION  ECOG PERFORMANCE STATUS: 3 - Symptomatic, >50% confined to bed  Vitals:   12/16/21 1318  BP: (!) 105/57  Pulse: (!) 108  Resp: 19  Temp: (!) 96.8 F (36 C)  SpO2: 100%    Physical Exam Constitutional:      General: She is not in acute distress.    Appearance: Normal appearance. She is not toxic-appearing.  HENT:     Head: Normocephalic and atraumatic.  Eyes:     General: No scleral  icterus. Cardiovascular:     Rate and Rhythm: Normal rate and regular rhythm.     Pulses: Normal pulses.     Heart sounds: Normal heart sounds.  Pulmonary:     Effort: Pulmonary effort is normal.     Breath sounds: Normal breath sounds.  Abdominal:     General: Abdomen is flat. Bowel sounds are normal. There is no distension.     Palpations: Abdomen is soft.     Tenderness: There is no abdominal tenderness.  Musculoskeletal:        General: No swelling.     Cervical back: Neck supple.  Lymphadenopathy:     Cervical: No cervical adenopathy.  Skin:    General: Skin is warm and dry.     Findings: Rash (shingles like rash noted) present.  Neurological:     General: No focal deficit present.     Mental  Status: She is alert.  Psychiatric:        Mood and Affect: Mood normal.        Behavior: Behavior normal.     LABORATORY DATA:  CBC    Component Value Date/Time   WBC 5.2 12/16/2021 1300   WBC 6.7 07/25/2021 0111   RBC 4.03 12/16/2021 1300   HGB 12.2 12/16/2021 1300   HGB 15.7 07/07/2021 1538   HCT 38.3 12/16/2021 1300   HCT 46.6 07/07/2021 1538   PLT 477 (H) 12/16/2021 1300   PLT 450 07/07/2021 1538   MCV 95.0 12/16/2021 1300   MCV 88 07/07/2021 1538   MCH 30.3 12/16/2021 1300   MCHC 31.9 12/16/2021 1300   RDW 14.4 12/16/2021 1300   RDW 12.9 07/07/2021 1538   LYMPHSABS 0.5 (L) 12/16/2021 1300   LYMPHSABS 1.6 07/07/2021 1538   MONOABS 0.8 12/16/2021 1300   EOSABS 0.2 12/16/2021 1300   EOSABS 0.1 07/07/2021 1538   BASOSABS 0.0 12/16/2021 1300   BASOSABS 0.0 07/07/2021 1538    CMP     Component Value Date/Time   NA 139 12/16/2021 1300   NA 141 07/07/2021 1538   K 4.2 12/16/2021 1300   CL 105 12/16/2021 1300   CO2 25 12/16/2021 1300   GLUCOSE 91 12/16/2021 1300   BUN 12 12/16/2021 1300   BUN 9 07/07/2021 1538   CREATININE 0.48 12/16/2021 1300   CALCIUM 10.4 (H) 12/16/2021 1300   PROT 7.4 12/16/2021 1300   PROT 7.9 07/07/2021 1538   ALBUMIN 3.9  12/16/2021 1300   ALBUMIN 4.6 07/07/2021 1538   AST 12 (L) 12/16/2021 1300   ALT 12 12/16/2021 1300   ALKPHOS 72 12/16/2021 1300   BILITOT 0.3 12/16/2021 1300   GFRNONAA >60 12/16/2021 1300   GFRAA 103 03/05/2020 1119         ASSESSMENT and THERAPY PLAN:   Malignant neoplasm of unspecified part of unspecified bronchus or lung ([place]) This is a 76 year old patient with 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  She also appears to have had some regional adenopathy including prevascular, AP window and left hilar adenopathy.  She has multiple intracranial metastatic lesions which are likely secondary to metastatic lung cancer given presence of regional adenopathy.  She is now status post SRS to the brain lesions.  She started concurrent chemoradiation with CarboTaxol on August 30, 2021.  She completed chemoradiation on October 15, 2021.  She is here for follow-up.  She is now on single agent Keytruda every 3 weeks.  She is tolerating this very well.  On exam today, she has been doing remarkably well except for shingles like rash.  The right breast exam shows response, the mass is smaller and softer.  She is also due for a PET/CT to assess response hence have ordered this.  She will continue to follow-up with Dr. Mickeal Skinner for her brain lesions. For the shingles like rash, I will send Valtrex and lidocaine patch.  She will return to clinic for follow-up on December 12. Okay to proceed with Beryle Flock if labs are all within parameters   All questions were answered. The patient knows to call the clinic with any problems, questions or concerns. We can certainly see the patient much sooner if necessary.  Total encounter time:30 minutes*in face-to-face visit time, chart review, lab review, care coordination, order entry, and documentation of the encounter time.  *Total Encounter Time as defined by the Centers  for Medicare  and Medicaid Services includes, in addition to the face-to-face time of a patient visit (documented in the note above) non-face-to-face time: obtaining and reviewing outside history, ordering and reviewing medications, tests or procedures, care coordination (communications with other health care professionals or caregivers) and documentation in the medical record.

## 2021-12-16 NOTE — Progress Notes (Signed)
 [facility] [address]  , [place] [address] [phone]   New Patient Evaluation  Date of Service: 12/16/21 Patient Name: Jennifer Cowan Patient MRN: [id number] Patient DOB: 07/14/1945 Provider:  K , MD  Identifying Statement:  Jennifer Cowan is a 76 y.o. female with Cerebrovascular accident (CVA), unspecified mechanism (HCC)  Metastasis to brain (HCC) who presents for initial consultation and evaluation regarding cancer associated neurologic deficits.    Referring Provider: Morgan, Tiffany M, FNP [address] [place],  [place] [address]  Primary Cancer:  Oncologic History: Oncology History  Malignant neoplasm of unspecified part of unspecified bronchus or lung (HCC)  08/13/2021 Initial Diagnosis   Malignant neoplasm of unspecified part of unspecified bronchus or lung (HCC)   08/30/2021 - 09/13/2021 Chemotherapy   Patient is on Treatment Plan : LUNG Carboplatin / Paclitaxel + XRT q7d     08/30/2021 - 10/11/2021 Chemotherapy   Patient is on Treatment Plan : LUNG Carboplatin + Paclitaxel + XRT q7d     11/16/2021 -  Chemotherapy   Patient is on Treatment Plan : LUNG Pembrolizumab Q 21D      CNS Oncologic History 08/20/21: Completes SRS to 6 metastases, including frx to large R temporal and L frontal lesions ([place])  History of Present Illness: The patient's records from the referring physician were obtained and reviewed and the patient interviewed to confirm this HPI.  Jennifer Cowan presents today for evaluation following abnormal MRI brain study.  She denies any focal neurolgic complaints at this time.  No weakness, numbness, speech difficulty.  Family does help her with cooking and "running the bath" because of memory, cognitive issues.  She walks on her own but uses a cane or walker for longer trips.  Has undergone one infusion of Keytruda with Dr. Iruku.  No headaches or seizures.  Medications: Current Outpatient  Medications on File Prior to Visit  Medication Sig Dispense Refill   Accu-Chek Softclix Lancets lancets Use to check blood sugar as directed 4 times a day 100 each 1   acetaminophen (TYLENOL) 500 MG tablet Take 1,000 mg by mouth every 6 (six) hours as needed for moderate pain.     amLODipine (NORVASC) 10 MG tablet TAKE 1 TABLET BY MOUTH DAILY 90 tablet 3   atorvastatin (LIPITOR) 80 MG tablet Take 1 tablet (80 mg total) by mouth daily. 90 tablet 3   benazepril (LOTENSIN) 10 MG tablet TAKE 1 TABLET BY MOUTH DAILY 90 tablet 3   blood glucose meter kit and supplies KIT Dispense based on patient and insurance preference. Use up to four times daily as directed. 1 each 0   Blood Glucose Monitoring Suppl (ACCU-CHEK GUIDE) w/Device KIT Use as directed 4 times a day 1 kit 0   Cholecalciferol (VITAMIN D3) 125 MCG (5000 UT) CAPS Take 5,000 Units by mouth daily.     dexamethasone (DECADRON) 4 MG tablet Take 1 tablet (4 mg total) by mouth 3 (three) times daily. 90 tablet 1   ezetimibe (ZETIA) 10 MG tablet Take 1 tablet (10 mg total) by mouth daily. 90 tablet 3   glucose 4 GM chewable tablet Chew 1 tablet (4 g total) by mouth as needed for low blood sugar. 50 tablet 0   glucose blood (ACCU-CHEK GUIDE) test strip Use as directed 4 times a day 100 each 1   insulin glargine (LANTUS SOLOSTAR) 100 UNIT/ML Solostar Pen Inject 8 Units into the skin at bedtime. 15 mL   1   Insulin Pen Needle 31G X 5 MM MISC 1 each by Does not apply route daily. 90 each 1   insulin regular (NOVOLIN R) 100 units/mL injection If blood sugar is 200 or less: 0 units 201-250: 2 units 251-300: 4 units 301-350: 6 units 351-400: 8 units 401 or higher: 10 units and call MD 10 mL 1   Insulin Syringe-Needle U-100 (INSULIN SYRINGE .3CC/29GX1") 29G X 1" 0.3 ML MISC 1 Dose by Does not apply route 3 (three) times daily before meals. 90 each 1   LORazepam (ATIVAN) 0.5 MG tablet 1 tab po 30 minutes prior to radiation or MRI scans 30 tablet 0   meloxicam  (MOBIC) 7.5 MG tablet TAKE 1 TABLET BY MOUTH DAILY 90 tablet 3   potassium chloride (MICRO-K) 10 MEQ CR capsule Take 2 capsules (20 mEq total) by mouth daily. 60 capsule 1   prochlorperazine (COMPAZINE) 10 MG tablet Take 1 tablet (10 mg total) by mouth every 6 (six) hours as needed for nausea or vomiting. 30 tablet 1   sucralfate (CARAFATE) 1 g tablet Take 1 tablet (1 g total) by mouth 4 (four) times daily -  with meals and at bedtime. Crush 1 tablet in 1 oz water and drink 5 min before meals for radiation induced esophagitis 120 tablet 2   trolamine salicylate (ASPERCREME) 10 % cream Apply 1 Application topically daily as needed for muscle pain.     Current Facility-Administered Medications on File Prior to Visit  Medication Dose Route Frequency Provider Last Rate Last Admin   acetaminophen (TYLENOL) 325 MG tablet            cyanocobalamin (VITAMIN B12) 1000 MCG/ML injection            diphenhydrAMINE (BENADRYL) 25 mg capsule            gadopiclenol (VUEWAY) 0.5 MMOL/ML solution 7.5 mL  7.5 mL Intravenous Once PRN Tyler Pita, MD       heparin lock flush 100 unit/mL  500 Units Intravenous Once Kyung Rudd, MD       sodium chloride flush (NS) 0.9 % injection 10 mL  10 mL Intravenous Once Kyung Rudd, MD        Allergies: No Known Allergies Past Medical History:  Past Medical History:  Diagnosis Date   Anemia    Arthritis    Breast cancer ([place])    Cancer ([facility]) 1999   Breast Cancer   Hyperlipidemia    Hypertension    Osteopenia 01/12/2021   Past Surgical History:  Past Surgical History:  Procedure Laterality Date   [place]- left   BRONCHIAL BIOPSY  07/30/2021   Procedure: BRONCHIAL BIOPSIES;  Surgeon: Collene Gobble, MD;  Location: [facility];  Service: Pulmonary;;   BRONCHIAL BRUSHINGS  07/30/2021   Procedure: BRONCHIAL BRUSHINGS;  Surgeon: Collene Gobble, MD;  Location: [facility] ENDOSCOPY;  Service: Pulmonary;;   BRONCHIAL NEEDLE ASPIRATION BIOPSY   07/30/2021   Procedure: BRONCHIAL NEEDLE ASPIRATION BIOPSIES;  Surgeon: Collene Gobble, MD;  Location: [facility] ENDOSCOPY;  Service: Pulmonary;;   CYSTECTOMY     cyst removed off top of head   IR IMAGING GUIDED PORT INSERTION  08/20/2021   MASTECTOMY     VIDEO BRONCHOSCOPY WITH RADIAL ENDOBRONCHIAL ULTRASOUND  07/30/2021   Procedure: VIDEO BRONCHOSCOPY WITH RADIAL ENDOBRONCHIAL ULTRASOUND;  Surgeon: Collene Gobble, MD;  Location: [facility] ENDOSCOPY;  Service: Pulmonary;;   Social History:  Social History   Socioeconomic  History   Marital status: Single    Spouse name: Not on file   Number of children: 0   Years of education: 12   Highest education level: High school graduate  Occupational History   Occupation: retired  Tobacco Use   Smoking status: Never   Smokeless tobacco: Never  Vaping Use   Vaping Use: Never used  Substance and Sexual Activity   Alcohol use: No   Drug use: Never   Sexual activity: Yes    Birth control/protection: Post-menopausal  Other Topics Concern   Not on file  Social History Narrative   Not on file   Social Determinants of Health   Financial Resource Strain: Not on file  Food Insecurity: Not on file  Transportation Needs: Not on file  Physical Activity: Not on file  Stress: Not on file  Social Connections: Not on file  Intimate Partner Violence: Not on file   Family History:  Family History  Problem Relation Age of Onset   Arthritis Mother    Breast cancer Sister    Cancer Sister        Breast Cancer   Diabetes Sister    Hyperlipidemia Sister    Hypertension Sister    Stroke Sister    Diabetes Brother    Hypertension Brother     Review of Systems: Constitutional: Doesn't report fevers, chills or abnormal weight loss Eyes: Doesn't report blurriness of vision Ears, nose, mouth, throat, and face: Doesn't report sore throat Respiratory: Doesn't report cough, dyspnea or wheezes Cardiovascular: Doesn't report palpitation, chest discomfort   Gastrointestinal:  Doesn't report nausea, constipation, diarrhea GU: Doesn't report incontinence Skin: Doesn't report skin rashes Neurological: Per HPI Musculoskeletal: Doesn't report joint pain Behavioral/Psych: Doesn't report anxiety  Physical Exam: Vitals:   12/16/21 1211  BP: 99/61  Pulse: (!) 108  Resp: 18  Temp: (!) 97 F (36.1 C)  SpO2: 97%   KPS: 70. General: Alert, cooperative, pleasant, in no acute distress Head: Normal EENT: No conjunctival injection or scleral icterus.  Lungs: Resp effort normal Cardiac: Regular rate Abdomen: Non-distended abdomen Skin: No rashes cyanosis or petechiae. Extremities: No clubbing or edema  Neurologic Exam: Mental Status: Awake, alert, attentive to examiner. Oriented to self and environment. Language is fluent with intact comprehension.  Pyschomotor slowing, impaired recall Cranial Nerves: Visual acuity is grossly normal. Visual fields are full. Extra-ocular movements intact. No ptosis. Face is symmetric Motor: Tone and bulk are normal. Power is full in both arms and legs. Reflexes are symmetric, no pathologic reflexes present.  Sensory: Intact to light touch Gait: deferred   Labs: I have reviewed the data as listed    Component Value Date/Time   NA 140 11/16/2021 1250   NA 141 07/07/2021 1538   K 2.9 (L) 11/16/2021 1250   CL 102 11/16/2021 1250   CO2 31 11/16/2021 1250   GLUCOSE 109 (H) 11/16/2021 1250   BUN 9 11/16/2021 1250   BUN 9 07/07/2021 1538   CREATININE 0.49 11/16/2021 1250   CALCIUM 9.3 11/16/2021 1250   PROT 6.6 11/16/2021 1250   PROT 7.9 07/07/2021 1538   ALBUMIN 3.4 (L) 11/16/2021 1250   ALBUMIN 4.6 07/07/2021 1538   AST 23 11/16/2021 1250   ALT 17 11/16/2021 1250   ALKPHOS 63 11/16/2021 1250   BILITOT 0.5 11/16/2021 1250   GFRNONAA >60 11/16/2021 1250   GFRAA 103 03/05/2020 1119   Lab Results  Component Value Date   WBC 4.1 11/16/2021   NEUTROABS 2.9 11/16/2021  HGB 11.3 (L) 11/16/2021   HCT  35.0 (L) 11/16/2021   MCV 95.1 11/16/2021   PLT 438 (H) 11/16/2021    Imaging:  MR Brain W Wo Contrast  Result Date: 12/01/2021 CLINICAL DATA:  Follow-up brain metastases treated with radiation therapy 08/16/2021 through 08/20/2021. History of lung cancer and breast cancer. EXAM: MRI HEAD WITHOUT AND WITH CONTRAST TECHNIQUE: Multiplanar, multiecho pulse sequences of the brain and surrounding structures were obtained without and with intravenous contrast. CONTRAST:  8 mL Vueway Contrast was administered via a port which was accessed by a Equities trader. COMPARISON:  Head MRI 08/06/2021 FINDINGS: The patient was unable to complete the examination during a single session due to back pain. Precontrast imaging was obtained on 11/24/2021, and the patient returned on 12/01/2021 for postcontrast imaging. Brain: New lesions: 1. 3 mm enhancing focus in the posterior right frontal lobe (series 16, image 121), not seen on black blood postcontrast imaging. 2. 2 mm enhancing focus in the left perirolandic region (series 16, image 124), not seen on black blood postcontrast imaging. Larger lesions: None. Stable or smaller lesions: 1. 3.0 x 2.2 cm lesion in the right temporal lobe, greatly decreased in size from prior with new subacute blood products (series 15, image 65). 2. The separate small lesion more anteriorly in the right temporal lobe is no longer visible. 3. 1-2 mm lesion in the left temporal lobe, decreased in size from prior (series 15, image 73). 4. 1.2 cm lesion in the parasagittal left frontal lobe, decreased in size from prior (series 15, image 132). 5. 4 mm lesion versus prominent vessel in the posterior left occipital lobe, unchanged (series 17, image 6). 6. The 2 mm lesion more laterally in the left occipital lobe on the prior study is no longer visible. Other brain findings: There is mild residual edema associated with the right temporal and left frontal metastasis, greatly decreased from prior. 3  subcentimeter foci of trace diffusion weighted signal hyperintensity are present in the subcortical white matter of the right and left frontal lobes and right parieto-occipital region without reduced ADC or enhancement (series 3, images 61 and 67). There is a focus of microhemorrhage adjacent to the right frontal diffusion abnormality. Small T2 hyperintensities elsewhere in the cerebral white matter bilaterally appear to have mildly progressed from the prior MRI and are nonspecific but compatible with mild-to-moderate chronic small vessel ischemic disease. Small chronic left occipital and left cerebellar infarcts are unchanged. There is no evidence of an acute large territory infarct, midline shift, hydrocephalus, or extra-axial fluid collection. There is mild ex vacuo dilatation of the temporal horn of the right lateral ventricle. 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 Vascular: Major intracranial vascular flow voids are preserved. Skull and upper cervical spine: No suspicious marrow lesion. Sinuses/Orbits: Unremarkable orbits. Paranasal sinuses and mastoid air cells are clear. Other: None. IMPRESSION: 1. Decreased size of the treated brain metastases with some no longer being visible. 2. Two new subcentimeter enhancing foci in the right frontal lobe and left perirolandic region, indeterminate though potentially reflecting subacute infarcts (particularly given #3 below) versus new small metastases. Short-term follow-up MRI is recommended. 3. 3 subcentimeter foci of nonenhancing subcortical diffusion abnormality in the frontal lobes and right parieto-occipital region which may reflect subacute infarcts. 4. 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 Attention on follow-up studies. Electronically Signed  By: Logan Bores M.D.   On: 12/01/2021 16:30    [facility]  Clinician Interpretation: I have personally reviewed the radiological images as listed.  My interpretation, in the context of the patient's clinical presentation, is  infarcts vs metastases vs mixed   Assessment/Plan Cerebrovascular accident (CVA), unspecified mechanism ([place])  Metastasis to brain [facility])  [name] is clinically stable today, now ~4 month s/p fractionated SRS to large right temporal and left frontal metastases.    Current MRI demonstrates two new small foci of enhancement, as well as two separate small foci of DWI signal abnormality.  Etiology is vascular/infarct(s) vs small novel metastases vs both processes.    Regardless, she has older infarcts and remains at high risk for stroke.  We reviewed secondary prevention and discussed risk factors for stroke.    We recommended initiating Aspirin 361m daily for prevention, she will also continue high dose statin.    Recommended transthoracic echocardiogram, as well as MRA head/neck for further evaluation.  MRA can be added on to MRI brain study which will be ordered for the end of December.  We will follow up with her after this testing in early January 2024.    We spent twenty additional minutes teaching regarding the natural history, biology, and historical experience in the treatment of neurologic complications of cancer.   We appreciate the opportunity to participate in the care of MLATESHIA SCHMOKER   All questions were answered. The patient knows to call the clinic with any problems, questions or concerns. No barriers to learning were detected.  The total time spent in the encounter was 40 minutes and more than 50% was on counseling and review of test results   ZVentura Sellers MD Medical Director of Neuro-Oncology [facility] [place]11/30/23 11:29 AM

## 2021-12-16 NOTE — Patient Instructions (Signed)
[facility] ONCOLOGY  Discharge Instructions: Thank you for choosing [facility] to provide your oncology and hematology care.   If you have a lab appointment with the [facility], please go directly to the [facility] and check in at the registration area.   Wear comfortable clothing and clothing appropriate for easy access to any Portacath or PICC line.   We strive to give you quality time with your provider. You may need to reschedule your appointment if you arrive late (15 or more minutes).  Arriving late affects you and other patients whose appointments are after yours.  Also, if you miss three or more appointments without notifying the office, you may be dismissed from the clinic at the provider's discretion.      For prescription refill requests, have your pharmacy contact our office and allow 72 hours for refills to be completed.    Today you received the following chemotherapy and/or immunotherapy agent: Pembrolizumab (Keytruda)   To help prevent nausea and vomiting after your treatment, we encourage you to take your nausea medication as directed.  BELOW ARE SYMPTOMS THAT SHOULD BE REPORTED IMMEDIATELY: *FEVER GREATER THAN 100.4 F (38 C) OR HIGHER *CHILLS OR SWEATING *NAUSEA AND VOMITING THAT IS NOT CONTROLLED WITH YOUR NAUSEA MEDICATION *UNUSUAL SHORTNESS OF BREATH *UNUSUAL BRUISING OR BLEEDING *URINARY PROBLEMS (pain or burning when urinating, or frequent urination) *BOWEL PROBLEMS (unusual diarrhea, constipation, pain near the anus) TENDERNESS IN MOUTH AND THROAT WITH OR WITHOUT PRESENCE OF ULCERS (sore throat, sores in mouth, or a toothache) UNUSUAL RASH, SWELLING OR PAIN  UNUSUAL VAGINAL DISCHARGE OR ITCHING   Items with * indicate a potential emergency and should be followed up as soon as possible or go to the Emergency Department if any problems should occur.  Please show the CHEMOTHERAPY ALERT CARD or IMMUNOTHERAPY ALERT CARD at  check-in to the Emergency Department and triage nurse.  Should you have questions after your visit or need to cancel or reschedule your appointment, please contact [facility]  Dept: [phone]  and follow the prompts.  Office hours are 8:00 a.m. to 4:30 p.m. Monday - Friday. Please note that voicemails left after 4:00 p.m. may not be returned until the following business day.  We are closed weekends and major holidays. You have access to a nurse at all times for urgent questions. Please call the main number to the clinic Dept: [phone] and follow the prompts.   For any non-urgent questions, you may also contact your provider using MyChart. We now offer e-Visits for anyone 60 and older to request care online for non-urgent symptoms. For details visit [email or web address].   Also download the MyChart app! Go to the app store, search "MyChart", open the app, select [facility], and log in with your MyChart username and password.  Masks are optional in the cancer centers. If you would like for your care team to wear a mask while they are taking care of you, please let them know. You may have one support person who is at least 76 years old accompany you for your appointments. Pembrolizumab Injection What is this medication? PEMBROLIZUMAB (PEM broe LIZ ue mab) treats some types of cancer. It works by helping your immune system slow or stop the spread of cancer cells. It is a monoclonal antibody. This medicine may be used for other purposes; ask your health care provider or pharmacist if you have questions. COMMON BRAND NAME(S): Keytruda What should I tell  my care team before I take this medication? They need to know if you have any of these conditions: Allogeneic stem cell transplant (uses someone else's stem cells) Autoimmune diseases, such as Crohn disease, ulcerative colitis, lupus History of chest radiation Nervous system problems, such as Guillain-Barre  syndrome, myasthenia gravis Organ transplant An unusual or allergic reaction to pembrolizumab, other medications, foods, dyes, or preservatives Pregnant or trying to get pregnant Breast-feeding How should I use this medication? This medication is injected into a vein. It is given by your care team in a hospital or clinic setting. A special MedGuide will be given to you before each treatment. Be sure to read this information carefully each time. Talk to your care team about the use of this medication in children. While it may be prescribed for children as young as 6 months for selected conditions, precautions do apply. Overdosage: If you think you have taken too much of this medicine contact a poison control center or emergency room at once. NOTE: This medicine is only for you. Do not share this medicine with others. What if I miss a dose? Keep appointments for follow-up doses. It is important not to miss your dose. Call your care team if you are unable to keep an appointment. What may interact with this medication? Interactions have not been studied. This list may not describe all possible interactions. Give your health care provider a list of all the medicines, herbs, non-prescription drugs, or dietary supplements you use. Also tell them if you smoke, drink alcohol, or use illegal drugs. Some items may interact with your medicine. What should I watch for while using this medication? Your condition will be monitored carefully while you are receiving this medication. You may need blood work while taking this medication. This medication may cause serious skin reactions. They can happen weeks to months after starting the medication. Contact your care team right away if you notice fevers or flu-like symptoms with a rash. The rash may be red or purple and then turn into blisters or peeling of the skin. You may also notice a red rash with swelling of the face, lips, or lymph nodes in your neck or under  your arms. Tell your care team right away if you have any change in your eyesight. Talk to your care team if you may be pregnant. Serious birth defects can occur if you take this medication during pregnancy and for 4 months after the last dose. You will need a negative pregnancy test before starting this medication. Contraception is recommended while taking this medication and for 4 months after the last dose. Your care team can help you find the option that works for you. Do not breastfeed while taking this medication and for 4 months after the last dose. What side effects may I notice from receiving this medication? Side effects that you should report to your care team as soon as possible: Allergic reactions--skin rash, itching, hives, swelling of the face, lips, tongue, or throat Dry cough, shortness of breath or trouble breathing Eye pain, redness, irritation, or discharge with blurry or decreased vision Heart muscle inflammation--unusual weakness or fatigue, shortness of breath, chest pain, fast or irregular heartbeat, dizziness, swelling of the ankles, feet, or hands Hormone gland problems--headache, sensitivity to light, unusual weakness or fatigue, dizziness, fast or irregular heartbeat, increased sensitivity to cold or heat, excessive sweating, constipation, hair loss, increased thirst or amount of urine, tremors or shaking, irritability Infusion reactions--chest pain, shortness of breath or trouble breathing,  feeling faint or lightheaded Kidney injury (glomerulonephritis)--decrease in the amount of urine, red or dark brown urine, foamy or bubbly urine, swelling of the ankles, hands, or feet Liver injury--right upper belly pain, loss of appetite, nausea, light-colored stool, dark yellow or brown urine, yellowing skin or eyes, unusual weakness or fatigue Pain, tingling, or numbness in the hands or feet, muscle weakness, change in vision, confusion or trouble speaking, loss of balance or  coordination, trouble walking, seizures Rash, fever, and swollen lymph nodes Redness, blistering, peeling, or loosening of the skin, including inside the mouth Sudden or severe stomach pain, bloody diarrhea, fever, nausea, vomiting Side effects that usually do not require medical attention (report to your care team if they continue or are bothersome): Bone, joint, or muscle pain Diarrhea Fatigue Loss of appetite Nausea Skin rash This list may not describe all possible side effects. Call your doctor for medical advice about side effects. You may report side effects to FDA at 1-800-FDA-1088. Where should I keep my medication? This medication is given in a hospital or clinic. It will not be stored at home. NOTE: This sheet is a summary. It may not cover all possible information. If you have questions about this medicine, talk to your doctor, pharmacist, or health care provider.  2023 Elsevier/Gold Standard (2012-09-24 00:00:00)

## 2021-12-16 NOTE — Assessment & Plan Note (Signed)
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  She also appears to have had some regional adenopathy including prevascular, AP window and left hilar adenopathy.  She has multiple intracranial metastatic lesions which are likely secondary to metastatic lung cancer given presence of regional adenopathy.  She is now status post SRS to the brain lesions.  She started concurrent chemoradiation with CarboTaxol on August 30, 2021.  She completed chemoradiation on October 15, 2021.  She is here for follow-up.  She is now on single agent Keytruda every 3 weeks.  She is tolerating this very well.  On exam today, she has been doing remarkably well except for shingles like rash.  The right breast exam shows response, the mass is smaller and softer.  She is also due for a PET/CT to assess response hence have ordered this.  She will continue to follow-up with Dr. Mickeal Skinner for her brain lesions. For the shingles like rash, I will send Valtrex and lidocaine patch.  She will return to clinic for follow-up on December 12. Okay to proceed with Beryle Flock if labs are all within parameters

## 2021-12-16 NOTE — Progress Notes (Signed)
Per Dr Chryl Heck, ok to proceed today with elevated HR 108-110

## 2021-12-23 ENCOUNTER — Other Ambulatory Visit: Payer: Self-pay | Admitting: Radiation Therapy

## 2021-12-27 ENCOUNTER — Other Ambulatory Visit (HOSPITAL_COMMUNITY): Payer: Self-pay

## 2021-12-28 ENCOUNTER — Encounter: Payer: Self-pay | Admitting: Hematology and Oncology

## 2021-12-28 ENCOUNTER — Inpatient Hospital Stay (HOSPITAL_BASED_OUTPATIENT_CLINIC_OR_DEPARTMENT_OTHER): Payer: Medicare Other | Admitting: Nurse Practitioner

## 2021-12-28 ENCOUNTER — Inpatient Hospital Stay: Payer: Medicare Other | Attending: Hematology and Oncology

## 2021-12-28 ENCOUNTER — Inpatient Hospital Stay (HOSPITAL_BASED_OUTPATIENT_CLINIC_OR_DEPARTMENT_OTHER): Payer: Medicare Other | Admitting: Hematology and Oncology

## 2021-12-28 ENCOUNTER — Inpatient Hospital Stay: Payer: Medicare Other

## 2021-12-28 ENCOUNTER — Other Ambulatory Visit: Payer: Self-pay

## 2021-12-28 ENCOUNTER — Encounter: Payer: Self-pay | Admitting: Nurse Practitioner

## 2021-12-28 VITALS — BP 100/59 | HR 109 | Temp 98.4°F | Resp 16 | Ht 68.0 in | Wt 161.4 lb

## 2021-12-28 DIAGNOSIS — Z95828 Presence of other vascular implants and grafts: Secondary | ICD-10-CM

## 2021-12-28 DIAGNOSIS — Z515 Encounter for palliative care: Secondary | ICD-10-CM

## 2021-12-28 DIAGNOSIS — C7931 Secondary malignant neoplasm of brain: Secondary | ICD-10-CM | POA: Diagnosis not present

## 2021-12-28 DIAGNOSIS — Z79899 Other long term (current) drug therapy: Secondary | ICD-10-CM | POA: Diagnosis not present

## 2021-12-28 DIAGNOSIS — Z5111 Encounter for antineoplastic chemotherapy: Secondary | ICD-10-CM | POA: Diagnosis not present

## 2021-12-28 DIAGNOSIS — R739 Hyperglycemia, unspecified: Secondary | ICD-10-CM

## 2021-12-28 DIAGNOSIS — C3490 Malignant neoplasm of unspecified part of unspecified bronchus or lung: Secondary | ICD-10-CM

## 2021-12-28 DIAGNOSIS — Z7189 Other specified counseling: Secondary | ICD-10-CM

## 2021-12-28 DIAGNOSIS — Z853 Personal history of malignant neoplasm of breast: Secondary | ICD-10-CM | POA: Diagnosis not present

## 2021-12-28 DIAGNOSIS — R530 Neoplastic (malignant) related fatigue: Secondary | ICD-10-CM | POA: Diagnosis not present

## 2021-12-28 DIAGNOSIS — R630 Anorexia: Secondary | ICD-10-CM | POA: Diagnosis not present

## 2021-12-28 LAB — CMP (CANCER CENTER ONLY)
ALT: 8 U/L (ref 0–44)
AST: 10 U/L — ABNORMAL LOW (ref 15–41)
Albumin: 3.5 g/dL (ref 3.5–5.0)
Alkaline Phosphatase: 71 U/L (ref 38–126)
Anion gap: 6 (ref 5–15)
BUN: 10 mg/dL (ref 8–23)
CO2: 27 mmol/L (ref 22–32)
Calcium: 10.1 mg/dL (ref 8.9–10.3)
Chloride: 107 mmol/L (ref 98–111)
Creatinine: 0.51 mg/dL (ref 0.44–1.00)
GFR, Estimated: >60 mL/min (ref >60)
Glucose, Bld: 155 mg/dL — ABNORMAL HIGH (ref 70–99)
Potassium: 3.8 mmol/L (ref 3.5–5.1)
Sodium: 140 mmol/L (ref 135–145)
Total Bilirubin: 0.3 mg/dL (ref 0.3–1.2)
Total Protein: 6.4 g/dL — ABNORMAL LOW (ref 6.5–8.1)

## 2021-12-28 LAB — CBC WITH DIFFERENTIAL (CANCER CENTER ONLY)
Abs Immature Granulocytes: 0.01 10*3/uL (ref 0.00–0.07)
Basophils Absolute: 0 10*3/uL (ref 0.0–0.1)
Basophils Relative: 1 %
Eosinophils Absolute: 0.1 10*3/uL (ref 0.0–0.5)
Eosinophils Relative: 2 %
HCT: 38 % (ref 36.0–46.0)
Hemoglobin: 12 g/dL (ref 12.0–15.0)
Immature Granulocytes: 0 %
Lymphocytes Relative: 9 %
Lymphs Abs: 0.4 10*3/uL — ABNORMAL LOW (ref 0.7–4.0)
MCH: 30 pg (ref 26.0–34.0)
MCHC: 31.6 g/dL (ref 30.0–36.0)
MCV: 95 fL (ref 80.0–100.0)
Monocytes Absolute: 0.6 10*3/uL (ref 0.1–1.0)
Monocytes Relative: 13 %
Neutro Abs: 3.5 10*3/uL (ref 1.7–7.7)
Neutrophils Relative %: 75 %
Platelet Count: 490 10*3/uL — ABNORMAL HIGH (ref 150–400)
RBC: 4 MIL/uL (ref 3.87–5.11)
RDW: 14.3 % (ref 11.5–15.5)
WBC Count: 4.7 10*3/uL (ref 4.0–10.5)
nRBC: 0 % (ref 0.0–0.2)

## 2021-12-28 LAB — TSH: TSH: 2.546 u[IU]/mL (ref 0.350–4.500)

## 2021-12-28 MED ORDER — SODIUM CHLORIDE 0.9% FLUSH
10.0000 mL | Freq: Once | INTRAVENOUS | Status: AC
  Administered 2021-12-28: 10 mL

## 2021-12-28 MED ORDER — SODIUM CHLORIDE 0.9 % IV SOLN: Freq: Once | INTRAVENOUS | Status: AC

## 2021-12-28 MED ORDER — HEPARIN SOD (PORK) LOCK FLUSH 100 UNIT/ML IV SOLN
500.0000 [IU] | Freq: Once | INTRAVENOUS | Status: AC
  Administered 2021-12-28: 500 [IU] via INTRAVENOUS

## 2021-12-28 MED ORDER — HEPARIN SOD (PORK) LOCK FLUSH 100 UNIT/ML IV SOLN: 500.0000 [IU] | Freq: Once | INTRAVENOUS | Status: DC | PRN

## 2021-12-28 MED ORDER — SODIUM CHLORIDE 0.9% FLUSH: 10.0000 mL | INTRAVENOUS | Status: DC | PRN

## 2021-12-28 MED ORDER — SODIUM CHLORIDE 0.9% FLUSH
10.0000 mL | Freq: Once | INTRAVENOUS | Status: AC
  Administered 2021-12-28: 10 mL via INTRAVENOUS

## 2021-12-28 MED ORDER — SODIUM CHLORIDE 0.9 % IV SOLN: 200.0000 mg | Freq: Once | INTRAVENOUS | Status: DC

## 2021-12-28 NOTE — Progress Notes (Signed)
[place]  [phone] [phone]   Name: Jennifer Cowan Date: 12/28/2021 MRN: [id number]  DOB: 03/17/1945  Patient Care Team: Jennifer Perking, FNP as PCP - General (Family Medicine) Jennifer Cowan, Jennifer Sax, NP as Nurse Practitioner (Nurse Practitioner)    INTERVAL HISTORY: Jennifer Cowan is a 76 y.o. 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 Palliative ask to see for symptom management and goals of care.   SOCIAL HISTORY:     reports that she has never smoked. She has never used smokeless tobacco. She reports that she does not drink alcohol and does not use drugs.  ADVANCE DIRECTIVES:  Patient does not have an advanced directive however is interested in completing.   CODE STATUS:   PAST MEDICAL HISTORY: Past Medical History:  Diagnosis Date   Anemia    Arthritis    Breast cancer ([place])    Cancer ([place]) 1999   Breast Cancer   Hyperlipidemia    Hypertension    Osteopenia 01/12/2021    ALLERGIES:  has No Known Allergies.  MEDICATIONS:  Current Outpatient Medications  Medication Sig Dispense Refill   Accu-Chek Softclix Lancets lancets Use to check blood sugar as directed 4 times a day 100 each 1   acetaminophen (TYLENOL) 500 MG tablet Take 1,000 mg by mouth every 6 (six) hours as needed for moderate pain.     amLODipine (NORVASC) 10 MG tablet TAKE 1 TABLET BY MOUTH DAILY 90 tablet 3   aspirin EC 325 MG tablet Take 1 tablet (325 mg total) by mouth daily. 60 tablet 2   atorvastatin (LIPITOR) 80 MG tablet Take 1 tablet (80 mg total) by mouth daily. 90 tablet 3   benazepril (LOTENSIN) 10 MG tablet TAKE 1 TABLET BY MOUTH DAILY 90 tablet 3   blood glucose meter kit and supplies KIT Dispense based on patient and insurance preference. Use up to four times  daily as directed. 1 each 0   Blood Glucose Monitoring Suppl (ACCU-CHEK GUIDE) w/Device KIT Use as directed 4 times a day 1 kit 0   Cholecalciferol (VITAMIN D3) 125 MCG (5000 UT) CAPS Take 5,000 Units by mouth daily.     dexamethasone (DECADRON) 4 MG tablet Take 1 tablet (4 mg total) by mouth 3 (three) times daily. 90 tablet 1   ezetimibe (ZETIA) 10 MG tablet Take 1 tablet (10 mg total) by mouth daily. 90 tablet 3   glucose 4 GM chewable tablet Chew 1 tablet (4 g total) by mouth as needed for low blood sugar. 50 tablet 0   glucose blood (ACCU-CHEK GUIDE) test strip Use as directed 4 times a day 100 each 1   insulin glargine (LANTUS SOLOSTAR) 100 UNIT/ML Solostar Pen Inject 8 Units into the skin at bedtime. 15 mL 1   Insulin Pen Needle 31G X 5 MM MISC 1 each by Does not apply route daily. 90 each 1   insulin regular (NOVOLIN R) 100 units/mL injection If blood sugar is 200 or less: 0 units 201-250: 2 units 251-300: 4 units 301-350: 6 units 351-400: 8 units 401 or higher: 10 units and call MD 10 mL 1   Insulin Syringe-Needle U-100 (INSULIN SYRINGE .3CC/29GX1") 29G X 1" 0.3 ML MISC 1 Dose by Does not apply route 3 (three) times daily before meals. 90 each 1   lidocaine (LIDODERM) 5 % Place 1  patch onto the skin daily. Remove & Discard patch within 12 hours or as directed by MD 30 patch 0   LORazepam (ATIVAN) 0.5 MG tablet 1 tab po 30 minutes prior to radiation or MRI scans 30 tablet 0   meloxicam (MOBIC) 7.5 MG tablet TAKE 1 TABLET BY MOUTH DAILY 90 tablet 3   potassium chloride (MICRO-K) 10 MEQ CR capsule Take 2 capsules (20 mEq total) by mouth daily. 60 capsule 1   prochlorperazine (COMPAZINE) 10 MG tablet Take 1 tablet (10 mg total) by mouth every 6 (six) hours as needed for nausea or vomiting. 30 tablet 1   sucralfate (CARAFATE) 1 g tablet Take 1 tablet (1 g total) by mouth 4 (four) times daily -  with meals and at bedtime. Crush 1 tablet in 1 oz water and drink 5 min before meals for radiation  induced esophagitis 120 tablet 2   trolamine salicylate (ASPERCREME) 10 % cream Apply 1 Application topically daily as needed for muscle pain.     valACYclovir (VALTREX) 500 MG tablet Take 1 tablet (500 mg total) by mouth 2 (two) times daily. 20 tablet 0   Current Facility-Administered Medications  Medication Dose Route Frequency Provider Last Rate Last Admin   heparin lock flush 100 unit/mL  500 Units Intravenous Once Jennifer Rudd, MD       sodium chloride flush (NS) 0.9 % injection 10 mL  10 mL Intravenous Once Jennifer Rudd, MD       Facility-Administered Medications Ordered in Other Visits  Medication Dose Route Frequency Provider Last Rate Last Admin   acetaminophen (TYLENOL) 325 MG tablet            cyanocobalamin (VITAMIN B12) 1000 MCG/ML injection            diphenhydrAMINE (BENADRYL) 25 mg capsule            gadopiclenol (VUEWAY) 0.5 MMOL/ML solution 7.5 mL  7.5 mL Intravenous Once PRN Jennifer Pita, MD       heparin lock flush 100 unit/mL  500 Units Intracatheter Once PRN Jennifer Phlegm, NP       sodium chloride flush (NS) 0.9 % injection 10 mL  10 mL Intracatheter PRN Jennifer Cowan, Jennifer Massed, NP        VITAL SIGNS: There were no vitals taken for this visit. There were no vitals filed for this visit.   Estimated body mass index is 24.54 kg/m as calculated from the following:   Height as of an earlier encounter on 12/28/21: _0  (1.727 m).   Weight as of an earlier encounter on 12/28/21: 161 lb 6.4 oz (73.2 kg).   PERFORMANCE STATUS (ECOG) : 2 - Symptomatic, <50% confined to bed   Physical Exam General: NAD, in wheelchair  Cardiovascular: regular rate and rhythm Pulmonary: normal breathing pattern  Extremities: no edema, no joint deformities Skin: no rashes Neurological: AAO x3, mood appropriate   IMPRESSION: [name] presents to clinic today for follow-up. She is in a wheelchair.  Her sister and niece are present with her today. No acute distress.  Patient states things are going fairly well.   Denies nausea, vomiting, constipation, or diarrhea. Appetite has improved. Weight increased to 161 lbs up from 156 lbs 11/30. Family is appreciative of this.   Encouraged family to focus on small frequent meals versus 3 large meals per day and also encouraging patient to snack in between meals and when she has a desire.  Patient Sister Mardene Celeste continues to provide daily care  and offer support.  Family is interested in additional home support to assist with some ADLs.   Expressed goals remain clear by family to continue treating the treatable allowing Ms. Erskin to continue to thrive. Patient and family appreciative of her current state of health.   No symptom management needs.   I discussed the importance of continued conversation with family and their medical providers regarding overall plan of care and treatment options, ensuring decisions are within the context of the patients values and GOCs.  PLAN: Ongoing goals of care and support. Patient with some improvement.  No symptom needs at this time.  Will plan to follow-up in 4-6 weeks in collaboration with other oncology appointments.    Patient expressed understanding and was in agreement with this plan. She also understands that She can call the clinic at any time with any questions, concerns, or complaints.     Time Total: 20 min   Visit consisted of counseling and education dealing with the complex and emotionally intense issues of symptom management and palliative care in the setting of serious and potentially life-threatening illness.Greater than 50%  of this time was spent counseling and coordinating care related to the above assessment and plan.  Alda Lea, AGPCNP-BC  Palliative Medicine Team/ [place]

## 2021-12-28 NOTE — Assessment & Plan Note (Signed)
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  She also appears to have had some regional adenopathy including prevascular, AP window and left hilar adenopathy.  She has multiple intracranial metastatic lesions which are likely secondary to metastatic lung cancer given presence of regional adenopathy.  She is now status post SRS to the brain lesions.  She started concurrent chemoradiation with CarboTaxol on August 30, 2021.  She completed chemoradiation on October 15, 2021.  She is here for follow-up.  She is now on single agent Keytruda every 3 weeks.  She is tolerating this very well.  On exam today, she has been doing remarkably well.  The right breast exam shows response, the mass is smaller and softer.   End of treatment PET/CT ordered for December, this has been scheduled for December 18.  She will also get MRI brain as well as MRI per Dr. Mickeal Skinner brain imaging. If she continues to have great response in the brain and complete response in the lung, we can really review her tumor board to see if she will be a candidate for breast surgery.  At this time she appears to be tolerating Keytruda really well toxicity. Okay to proceed with Beryle Flock if labs are all within parameters

## 2021-12-28 NOTE — Progress Notes (Signed)
[facility] Follow up:    Jennifer Cowan, [place] [address]   DIAGNOSIS:  Cancer Staging  No matching staging information was found for the patient.  SUMMARY OF ONCOLOGIC HISTORY: Oncology History  Malignant neoplasm of unspecified part of unspecified bronchus or lung ([place])  08/13/2021 Initial Diagnosis   Malignant neoplasm of unspecified part of unspecified bronchus or lung ([place])   08/30/2021 - 09/13/2021 Chemotherapy   Patient is on Treatment Plan : LUNG Carboplatin / Paclitaxel + XRT q7d     08/30/2021 - 10/11/2021 Chemotherapy   Patient is on Treatment Plan : LUNG Carboplatin + Paclitaxel + XRT q7d     11/16/2021 -  Chemotherapy   Patient is on Treatment Plan : LUNG Pembrolizumab Q 21D       CURRENT THERAPY: Pembrolizumab  INTERVAL HISTORY:  Jennifer Cowan 76 y.o. female returns for follow-up of her breast and lung cancer.   She is on maintenance immunotherapy.  She is tolerating this very well.  She denies any complaints at all.  No cough, chest pain, shortness of breath.  She is walking well, eating well and hydrating well.  No diarrhea or change in urinary habits. She has PET and MR scheduled for end of December. 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  MEDICAL HISTORY: Past Medical History:  Diagnosis Date   Anemia    Arthritis    Breast cancer ([place])    Cancer ([place]) 1999   Breast Cancer   Hyperlipidemia    Hypertension    Osteopenia 01/12/2021    SURGICAL HISTORY: Past Surgical History:  Procedure Laterality Date   BREAST SURGERY  1999   Masectomy- left   BRONCHIAL BIOPSY  07/30/2021   Procedure: BRONCHIAL BIOPSIES;  Surgeon: Collene Gobble, MD;  Location[facility] ENDOSCOPY;  Service: Pulmonary;;   BRONCHIAL BRUSHINGS  07/30/2021   Procedure: BRONCHIAL BRUSHINGS;  Surgeon: Collene Gobble, MD;  [facility] ENDOSCOPY;  Service: Pulmonary;;   BRONCHIAL NEEDLE ASPIRATION BIOPSY  07/30/2021   Procedure: BRONCHIAL NEEDLE ASPIRATION BIOPSIES;  Surgeon: Collene Gobble, MD;  Location: [facility] ENDOSCOPY;  Service: Pulmonary;;   CYSTECTOMY     cyst removed off top of head   IR IMAGING GUIDED PORT INSERTION  08/20/2021   MASTECTOMY     VIDEO BRONCHOSCOPY WITH RADIAL ENDOBRONCHIAL ULTRASOUND  07/30/2021   Procedure: VIDEO BRONCHOSCOPY WITH RADIAL ENDOBRONCHIAL ULTRASOUND;  Surgeon: Collene Gobble, MD;  Location: [facility] ENDOSCOPY;  Service: Pulmonary;;    SOCIAL HISTORY: Social History   Socioeconomic History   Marital status: Single    Spouse name: Not on file  Number of children: 0   Years of education: 12   Highest education level: High school graduate  Occupational History   Occupation: retired  Tobacco Use   Smoking status: Never   Smokeless tobacco: Never  Vaping Use   Vaping Use: Never used  Substance and Sexual Activity   Alcohol use: No   Drug use: Never   Sexual activity: Yes    Birth control/protection: Post-menopausal  Other Topics Concern   Not on file  Social History Narrative   Not on file   Social Determinants of Health    Financial Resource Strain: Not on file  Food Insecurity: Not on file  Transportation Needs: Not on file  Physical Activity: Not on file  Stress: Not on file  Social Connections: Not on file  Intimate Partner Violence: Not on file    FAMILY HISTORY: Family History  Problem Relation Age of Onset   Arthritis Mother    Breast cancer Sister    Cancer Sister        Breast Cancer   Diabetes Sister    Hyperlipidemia Sister    Hypertension Sister    Stroke Sister    Diabetes Brother    Hypertension Brother     Review of Systems  Constitutional:  Positive for fatigue. Negative for appetite change, chills, fever and unexpected weight change.  HENT:   Negative for hearing loss, lump/mass and trouble swallowing.   Eyes:  Negative for eye problems and icterus.  Respiratory:  Negative for chest tightness, cough and shortness of breath.   Cardiovascular:  Negative for chest pain, leg swelling and palpitations.  Gastrointestinal:  Negative for abdominal distention, abdominal pain, constipation, diarrhea, nausea and vomiting.  Endocrine: Negative for hot flashes.  Genitourinary:  Negative for difficulty urinating.   Musculoskeletal:  Negative for arthralgias.  Skin:  Negative for itching and rash.  Neurological:  Negative for dizziness, extremity weakness, headaches and numbness.  Hematological:  Negative for adenopathy. Does not bruise/bleed easily.  Psychiatric/Behavioral:  Negative for depression. The patient is not nervous/anxious.       PHYSICAL EXAMINATION  ECOG PERFORMANCE STATUS: 3 - Symptomatic, >50% confined to bed  Vitals:   12/28/21 1104  BP: (!) 100/59  Pulse: (!) 109  Resp: 16  Temp: 98.4 F (36.9 C)  SpO2: 99%    Physical Exam Constitutional:      General: She is not in acute distress.    Appearance: Normal appearance. She is not toxic-appearing.  HENT:     Head: Normocephalic and atraumatic.  Eyes:     General: No scleral icterus. Cardiovascular:      Rate and Rhythm: Normal rate and regular rhythm.     Pulses: Normal pulses.     Heart sounds: Normal heart sounds.  Pulmonary:     Effort: Pulmonary effort is normal.     Breath sounds: Normal breath sounds.  Abdominal:     General: Abdomen is flat. Bowel sounds are normal. There is no distension.     Palpations: Abdomen is soft.     Tenderness: There is no abdominal tenderness.  Musculoskeletal:        General: No swelling.     Cervical back: Neck supple.  Lymphadenopathy:     Cervical: No cervical adenopathy.  Skin:    General: Skin is warm and dry.     Findings: No rash.  Neurological:     General: No focal deficit present.     Mental Status: She is alert.  Psychiatric:  Mood and Affect: Mood normal.        Behavior: Behavior normal.     LABORATORY DATA:  CBC    Component Value Date/Time   WBC 4.7 12/28/2021 1044   WBC 6.7 07/25/2021 0111   RBC 4.00 12/28/2021 1044   HGB 12.0 12/28/2021 1044   HGB 15.7 07/07/2021 1538   HCT 38.0 12/28/2021 1044   HCT 46.6 07/07/2021 1538   PLT 490 (H) 12/28/2021 1044   PLT 450 07/07/2021 1538   MCV 95.0 12/28/2021 1044   MCV 88 07/07/2021 1538   MCH 30.0 12/28/2021 1044   MCHC 31.6 12/28/2021 1044   RDW 14.3 12/28/2021 1044   RDW 12.9 07/07/2021 1538   LYMPHSABS 0.4 (L) 12/28/2021 1044   LYMPHSABS 1.6 07/07/2021 1538   MONOABS 0.6 12/28/2021 1044   EOSABS 0.1 12/28/2021 1044   EOSABS 0.1 07/07/2021 1538   BASOSABS 0.0 12/28/2021 1044   BASOSABS 0.0 07/07/2021 1538    CMP     Component Value Date/Time   NA 139 12/16/2021 1300   NA 141 07/07/2021 1538   K 4.2 12/16/2021 1300   CL 105 12/16/2021 1300   CO2 25 12/16/2021 1300   GLUCOSE 91 12/16/2021 1300   BUN 12 12/16/2021 1300   BUN 9 07/07/2021 1538   CREATININE 0.48 12/16/2021 1300   CALCIUM 10.4 (H) 12/16/2021 1300   PROT 7.4 12/16/2021 1300   PROT 7.9 07/07/2021 1538   ALBUMIN 3.9 12/16/2021 1300   ALBUMIN 4.6 07/07/2021 1538   AST 12 (L) 12/16/2021  1300   ALT 12 12/16/2021 1300   ALKPHOS 72 12/16/2021 1300   BILITOT 0.3 12/16/2021 1300   GFRNONAA >60 12/16/2021 1300   GFRAA 103 03/05/2020 1119         ASSESSMENT and THERAPY PLAN:   Malignant neoplasm of unspecified part of unspecified bronchus or lung ([place]) This is a 76 year old patient with 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  She also appears to have had some regional adenopathy including prevascular, AP window and left hilar adenopathy.  She has multiple intracranial metastatic lesions which are likely secondary to metastatic lung cancer given presence of regional adenopathy.  She is now status post SRS to the brain lesions.  She started concurrent chemoradiation with CarboTaxol on August 30, 2021.  She completed chemoradiation on October 15, 2021.  She is here for follow-up.  She is now on single agent Keytruda every 3 weeks.  She is tolerating this very well.  On exam today, she has been doing remarkably well.  The right breast exam shows response, the mass is smaller and softer.   End of treatment PET/CT ordered for December, this has been scheduled for December 18.  She will also get MRI brain as well as MRI per Dr. Mickeal Skinner brain imaging. If she continues to have great response in the brain and complete response in the lung, we can really review her tumor board to see if she will be a candidate for breast surgery.  At this time she appears to be tolerating Keytruda really well toxicity. Okay to proceed with Beryle Flock if labs are all within parameters   All questions were answered. The patient knows to call the clinic with any problems, questions or concerns. We can certainly see the patient much sooner if necessary.  Total encounter time:30 minutes*in face-to-face visit time, chart review, lab review, care coordination, order entry, and documentation of the encounter  time.  *Total Encounter  Time as defined by the Centers for Medicare and Medicaid Services includes, in addition to the face-to-face time of a patient visit (documented in the note above) non-face-to-face time: obtaining and reviewing outside history, ordering and reviewing medications, tests or procedures, care coordination (communications with other health care professionals or caregivers) and documentation in the medical record.

## 2021-12-29 ENCOUNTER — Telehealth: Payer: Self-pay | Admitting: Hematology and Oncology

## 2021-12-29 ENCOUNTER — Encounter: Payer: Self-pay | Admitting: Hematology and Oncology

## 2021-12-29 NOTE — Telephone Encounter (Signed)
Rescheduled appointment per 12/13 secure chat. Left voicemail.

## 2021-12-30 LAB — T4: T4, Total: 6.9 ug/dL (ref 4.5–12.0)

## 2021-12-31 ENCOUNTER — Ambulatory Visit (HOSPITAL_COMMUNITY)
Admission: RE | Admit: 2021-12-31 | Discharge: 2021-12-31 | Disposition: A | Discharge status: Home / Self Care | Payer: Medicare Other | Source: Ambulatory Visit | Attending: Internal Medicine | Admitting: Internal Medicine

## 2021-12-31 DIAGNOSIS — I639 Cerebral infarction, unspecified: Secondary | ICD-10-CM

## 2021-12-31 DIAGNOSIS — I10 Essential (primary) hypertension: Secondary | ICD-10-CM | POA: Diagnosis not present

## 2021-12-31 DIAGNOSIS — I6389 Other cerebral infarction: Secondary | ICD-10-CM | POA: Diagnosis not present

## 2021-12-31 DIAGNOSIS — E785 Hyperlipidemia, unspecified: Secondary | ICD-10-CM | POA: Diagnosis not present

## 2021-12-31 DIAGNOSIS — G9389 Other specified disorders of brain: Secondary | ICD-10-CM | POA: Diagnosis not present

## 2021-12-31 DIAGNOSIS — R918 Other nonspecific abnormal finding of lung field: Secondary | ICD-10-CM | POA: Diagnosis not present

## 2021-12-31 DIAGNOSIS — R5381 Other malaise: Secondary | ICD-10-CM | POA: Diagnosis not present

## 2021-12-31 DIAGNOSIS — C7931 Secondary malignant neoplasm of brain: Secondary | ICD-10-CM | POA: Diagnosis not present

## 2021-12-31 DIAGNOSIS — C50919 Malignant neoplasm of unspecified site of unspecified female breast: Secondary | ICD-10-CM | POA: Diagnosis not present

## 2021-12-31 LAB — ECHOCARDIOGRAM COMPLETE
Area-P 1/2: 4.57 cm2
Calc EF: 61.1 %
S' Lateral: 2.4 cm
Single Plane A2C EF: 60.5 %
Single Plane A4C EF: 64.1 %

## 2021-12-31 NOTE — Progress Notes (Signed)
Echocardiogram 2D Echocardiogram has been performed.  Fidel Levy 12/31/2021, 11:49 AM

## 2022-01-03 ENCOUNTER — Ambulatory Visit (HOSPITAL_COMMUNITY)
Admission: RE | Admit: 2022-01-03 | Discharge: 2022-01-03 | Disposition: A | Discharge status: Home / Self Care | Payer: Medicare Other | Source: Ambulatory Visit | Attending: Hematology and Oncology | Admitting: Hematology and Oncology

## 2022-01-03 DIAGNOSIS — C3490 Malignant neoplasm of unspecified part of unspecified bronchus or lung: Secondary | ICD-10-CM | POA: Insufficient documentation

## 2022-01-03 DIAGNOSIS — R918 Other nonspecific abnormal finding of lung field: Secondary | ICD-10-CM | POA: Diagnosis not present

## 2022-01-03 LAB — GLUCOSE, CAPILLARY: Glucose-Capillary: 85 mg/dL (ref 70–99)

## 2022-01-03 MED ORDER — FLUDEOXYGLUCOSE F - 18 (FDG) INJECTION
8.0000 | Freq: Once | INTRAVENOUS | Status: AC
  Administered 2022-01-03: 8 via INTRAVENOUS

## 2022-01-05 ENCOUNTER — Inpatient Hospital Stay: Payer: Medicare Other

## 2022-01-05 ENCOUNTER — Inpatient Hospital Stay (HOSPITAL_BASED_OUTPATIENT_CLINIC_OR_DEPARTMENT_OTHER): Payer: Medicare Other | Admitting: Hematology and Oncology

## 2022-01-05 ENCOUNTER — Encounter: Payer: Self-pay | Admitting: Hematology and Oncology

## 2022-01-05 DIAGNOSIS — C3490 Malignant neoplasm of unspecified part of unspecified bronchus or lung: Secondary | ICD-10-CM

## 2022-01-05 DIAGNOSIS — Z5111 Encounter for antineoplastic chemotherapy: Secondary | ICD-10-CM | POA: Diagnosis not present

## 2022-01-05 DIAGNOSIS — Z853 Personal history of malignant neoplasm of breast: Secondary | ICD-10-CM | POA: Diagnosis not present

## 2022-01-05 DIAGNOSIS — C7931 Secondary malignant neoplasm of brain: Secondary | ICD-10-CM | POA: Diagnosis not present

## 2022-01-05 DIAGNOSIS — R739 Hyperglycemia, unspecified: Secondary | ICD-10-CM

## 2022-01-05 DIAGNOSIS — Z95828 Presence of other vascular implants and grafts: Secondary | ICD-10-CM

## 2022-01-05 DIAGNOSIS — Z79899 Other long term (current) drug therapy: Secondary | ICD-10-CM | POA: Diagnosis not present

## 2022-01-05 LAB — CBC WITH DIFFERENTIAL (CANCER CENTER ONLY)
Abs Immature Granulocytes: 0.01 10*3/uL (ref 0.00–0.07)
Basophils Absolute: 0 10*3/uL (ref 0.0–0.1)
Basophils Relative: 0 %
Eosinophils Absolute: 0.1 10*3/uL (ref 0.0–0.5)
Eosinophils Relative: 1 %
HCT: 37.8 % (ref 36.0–46.0)
Hemoglobin: 12.2 g/dL (ref 12.0–15.0)
Immature Granulocytes: 0 %
Lymphocytes Relative: 9 %
Lymphs Abs: 0.5 10*3/uL — ABNORMAL LOW (ref 0.7–4.0)
MCH: 30 pg (ref 26.0–34.0)
MCHC: 32.3 g/dL (ref 30.0–36.0)
MCV: 92.9 fL (ref 80.0–100.0)
Monocytes Absolute: 0.6 10*3/uL (ref 0.1–1.0)
Monocytes Relative: 10 %
Neutro Abs: 4.7 10*3/uL (ref 1.7–7.7)
Neutrophils Relative %: 80 %
Platelet Count: 476 10*3/uL — ABNORMAL HIGH (ref 150–400)
RBC: 4.07 MIL/uL (ref 3.87–5.11)
RDW: 14.4 % (ref 11.5–15.5)
WBC Count: 6 10*3/uL (ref 4.0–10.5)
nRBC: 0 % (ref 0.0–0.2)

## 2022-01-05 LAB — CMP (CANCER CENTER ONLY)
ALT: 9 U/L (ref 0–44)
AST: 10 U/L — ABNORMAL LOW (ref 15–41)
Albumin: 3.6 g/dL (ref 3.5–5.0)
Alkaline Phosphatase: 78 U/L (ref 38–126)
Anion gap: 5 (ref 5–15)
BUN: 12 mg/dL (ref 8–23)
CO2: 27 mmol/L (ref 22–32)
Calcium: 10 mg/dL (ref 8.9–10.3)
Chloride: 106 mmol/L (ref 98–111)
Creatinine: 0.6 mg/dL (ref 0.44–1.00)
GFR, Estimated: >60 mL/min (ref >60)
Glucose, Bld: 165 mg/dL — ABNORMAL HIGH (ref 70–99)
Potassium: 3.7 mmol/L (ref 3.5–5.1)
Sodium: 138 mmol/L (ref 135–145)
Total Bilirubin: 0.3 mg/dL (ref 0.3–1.2)
Total Protein: 6.8 g/dL (ref 6.5–8.1)

## 2022-01-05 LAB — TSH: TSH: 2.51 u[IU]/mL (ref 0.350–4.500)

## 2022-01-05 MED ORDER — SODIUM CHLORIDE 0.9% FLUSH
10.0000 mL | Freq: Once | INTRAVENOUS | Status: AC
  Administered 2022-01-05: 10 mL

## 2022-01-05 MED ORDER — SODIUM CHLORIDE 0.9 % IV SOLN
200.0000 mg | Freq: Once | INTRAVENOUS | Status: AC
  Administered 2022-01-05: 200 mg via INTRAVENOUS
  Filled 2022-01-05: qty 200

## 2022-01-05 MED ORDER — SODIUM CHLORIDE 0.9 % IV SOLN: Freq: Once | INTRAVENOUS | Status: AC

## 2022-01-05 NOTE — Patient Instructions (Signed)

## 2022-01-05 NOTE — Assessment & Plan Note (Signed)
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  She also appears to have had some regional adenopathy including prevascular, AP window and left hilar adenopathy.  She has multiple intracranial metastatic lesions which are likely secondary to metastatic lung cancer given presence of regional adenopathy.  She is now status post SRS to the brain lesions.  She started concurrent chemoradiation with CarboTaxol on August 30, 2021.  She completed chemoradiation on October 15, 2021.  She is here for follow-up.  She is now on single agent Keytruda every 3 weeks.  She is tolerating this very well.  On exam today, she has been doing remarkably well.  The right breast exam shows response, the mass is smaller and softer.   End of treatment PET/CT reviewed consistent with response in the lung, likely postradiation changes noted.  The breast mass also appears smaller.  MRI brain scheduled for.  If she continues to have response in the breast and no evidence of disease in her next CT/PET imaging in about 3 months then we will review if there is any role for breast surgery.   CBC reviewed, satisfactory to proceed with immunotherapy.  Thank you for consulting [name] in the care of this patient.  Please do not hesitate to contact us with any additional questions or concerns.

## 2022-01-05 NOTE — Patient Instructions (Signed)
[facility]  Discharge Instructions: Thank you for choosing [facility] to provide your oncology and hematology care.   If you have a lab appointment with the [facility], please go directly to the [facility] and check in at the registration area.   Wear comfortable clothing and clothing appropriate for easy access to any Portacath or PICC line.   We strive to give you quality time with your provider. You may need to reschedule your appointment if you arrive late (15 or more minutes).  Arriving late affects you and other patients whose appointments are after yours.  Also, if you miss three or more appointments without notifying the office, you may be dismissed from the clinic at the provider's discretion.      For prescription refill requests, have your pharmacy contact our office and allow 72 hours for refills to be completed.    Today you received the following chemotherapy and/or immunotherapy agents Keytruda      To help prevent nausea and vomiting after your treatment, we encourage you to take your nausea medication as directed.  BELOW ARE SYMPTOMS THAT SHOULD BE REPORTED IMMEDIATELY: *FEVER GREATER THAN 100.4 F (38 C) OR HIGHER *CHILLS OR SWEATING *NAUSEA AND VOMITING THAT IS NOT CONTROLLED WITH YOUR NAUSEA MEDICATION *UNUSUAL SHORTNESS OF BREATH *UNUSUAL BRUISING OR BLEEDING *URINARY PROBLEMS (pain or burning when urinating, or frequent urination) *BOWEL PROBLEMS (unusual diarrhea, constipation, pain near the anus) TENDERNESS IN MOUTH AND THROAT WITH OR WITHOUT PRESENCE OF ULCERS (sore throat, sores in mouth, or a toothache) UNUSUAL RASH, SWELLING OR PAIN  UNUSUAL VAGINAL DISCHARGE OR ITCHING   Items with * indicate a potential emergency and should be followed up as soon as possible or go to the Emergency Department if any problems should occur.  Please show the CHEMOTHERAPY ALERT CARD or IMMUNOTHERAPY ALERT CARD at check-in to  the Emergency Department and triage nurse.  Should you have questions after your visit or need to cancel or reschedule your appointment, please contact [facility]  Dept: [phone]  and follow the prompts.  Office hours are 8:00 a.m. to 4:30 p.m. Monday - Friday. Please note that voicemails left after 4:00 p.m. may not be returned until the following business day.  We are closed weekends and major holidays. You have access to a nurse at all times for urgent questions. Please call the main number to the clinic Dept: [phone] and follow the prompts.   For any non-urgent questions, you may also contact your provider using MyChart. We now offer e-Visits for anyone 52 and older to request care online for non-urgent symptoms. For details visit [email or web address].   Also download the MyChart app! Go to the app store, search "MyChart", open the app, select [facility], and log in with your MyChart username and password.  Masks are optional in the cancer centers. If you would like for your care team to wear a mask while they are taking care of you, please let them know. You may have one support person who is at least 76 years old accompany you for your appointments.

## 2022-01-05 NOTE — Progress Notes (Signed)
Per Dr Chryl Heck, ok to proceed with treatment with elevated HR today.

## 2022-01-05 NOTE — Progress Notes (Signed)
[facility] Follow up:    Jennifer Cowan, [place] [id number]   DIAGNOSIS:  Cancer Staging  No matching staging information was found for the patient.  SUMMARY OF ONCOLOGIC HISTORY: Oncology History  Malignant neoplasm of unspecified part of unspecified bronchus or lung ([facility])  08/13/2021 Initial Diagnosis   Malignant neoplasm of unspecified part of unspecified bronchus or lung ([facility] [place])   08/30/2021 - 09/13/2021 Chemotherapy   Patient is on Treatment Plan : LUNG Carboplatin / Paclitaxel + XRT q7d     08/30/2021 - 10/11/2021 Chemotherapy   Patient is on Treatment Plan : LUNG Carboplatin + Paclitaxel + XRT q7d     11/16/2021 -  Chemotherapy   Patient is on Treatment Plan : LUNG Pembrolizumab Q 21D       CURRENT THERAPY: Pembrolizumab  INTERVAL HISTORY:  Jennifer Cowan 76 y.o. female returns for follow-up of her breast and lung cancer.   She is on maintenance immunotherapy.  She is tolerating this very well.  She denies any complaints at all.  No cough, chest pain, shortness of breath.  She is walking well, eating well and hydrating well.  No diarrhea or change in urinary habits. She is here to review her PET/CT results, MRI brain scheduled for 01/13/2022. 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  MEDICAL HISTORY: Past Medical History:  Diagnosis Date   Anemia    Arthritis    Breast cancer ([facility])    Cancer ([facility]) 1999   Breast Cancer   Hyperlipidemia    Hypertension    Osteopenia 01/12/2021    SURGICAL HISTORY: Past Surgical History:  Procedure Laterality Date   BREAST SURGERY  1999   Masectomy- left   BRONCHIAL BIOPSY  07/30/2021   Procedure: BRONCHIAL BIOPSIES;  Surgeon: Collene Gobble, MD;  Location: [facility] ENDOSCOPY;  Service: Pulmonary;;   BRONCHIAL BRUSHINGS  07/30/2021   Procedure: BRONCHIAL BRUSHINGS;  Surgeon: Collene Gobble, MD;  [facility];  Service: Pulmonary;;   BRONCHIAL NEEDLE ASPIRATION BIOPSY  07/30/2021   Procedure: BRONCHIAL NEEDLE ASPIRATION BIOPSIES;  Surgeon: Collene Gobble, MD;  Location: [facility] ENDOSCOPY;  Service: Pulmonary;;   CYSTECTOMY     cyst removed off top of head   IR IMAGING GUIDED PORT INSERTION  08/20/2021   MASTECTOMY     VIDEO BRONCHOSCOPY WITH RADIAL ENDOBRONCHIAL ULTRASOUND  07/30/2021   Procedure: VIDEO BRONCHOSCOPY WITH RADIAL ENDOBRONCHIAL ULTRASOUND;  Surgeon: Collene Gobble, MD;  Location: [facility] ENDOSCOPY;  Service: Pulmonary;;    SOCIAL HISTORY: Social History   Socioeconomic History   Marital status: Single    Spouse name: Not  on file   Number of children: 0   Years of education: 12   Highest education level: High school graduate  Occupational History   Occupation: retired  Tobacco Use   Smoking status: Never   Smokeless tobacco: Never  Vaping Use   Vaping Use: Never used  Substance and Sexual Activity   Alcohol use: No   Drug use: Never   Sexual activity: Yes    Birth control/protection: Post-menopausal  Other Topics Concern   Not on file  Social History Narrative   Not on file   Social  Determinants of Health   Financial Resource Strain: Not on file  Food Insecurity: Not on file  Transportation Needs: Not on file  Physical Activity: Not on file  Stress: Not on file  Social Connections: Not on file  Intimate Partner Violence: Not on file    FAMILY HISTORY: Family History  Problem Relation Age of Onset   Arthritis Mother    Breast cancer Sister    Cancer Sister        Breast Cancer   Diabetes Sister    Hyperlipidemia Sister    Hypertension Sister    Stroke Sister    Diabetes Brother    Hypertension Brother     Review of Systems  Constitutional:  Positive for fatigue. Negative for appetite change, chills, fever and unexpected weight change.  HENT:   Negative for hearing loss, lump/mass and trouble swallowing.   Eyes:  Negative for eye problems and icterus.  Respiratory:  Negative for chest tightness, cough and shortness of breath.   Cardiovascular:  Negative for chest pain, leg swelling and palpitations.  Gastrointestinal:  Negative for abdominal distention, abdominal pain, constipation, diarrhea, nausea and vomiting.  Endocrine: Negative for hot flashes.  Genitourinary:  Negative for difficulty urinating.   Musculoskeletal:  Negative for arthralgias.  Skin:  Negative for itching and rash.  Neurological:  Negative for dizziness, extremity weakness, headaches and numbness.  Hematological:  Negative for adenopathy. Does not bruise/bleed easily.  Psychiatric/Behavioral:  Negative for depression. The patient is not nervous/anxious.       PHYSICAL EXAMINATION  ECOG PERFORMANCE STATUS: 3 - Symptomatic, >50% confined to bed  Vitals:   01/05/22 1308  BP: 112/79  Pulse: (!) 107  Resp: 18  Temp: 98 F (36.7 C)  SpO2: 98%    Physical Exam Constitutional:      General: She is not in acute distress.    Appearance: Normal appearance. She is not toxic-appearing.  HENT:     Head: Normocephalic and atraumatic.  Eyes:     General: No scleral  icterus. Cardiovascular:     Rate and Rhythm: Normal rate and regular rhythm.     Pulses: Normal pulses.     Heart sounds: Normal heart sounds.  Pulmonary:     Effort: Pulmonary effort is normal.     Breath sounds: Normal breath sounds.  Abdominal:     General: Abdomen is flat. Bowel sounds are normal. There is no distension.     Palpations: Abdomen is soft.     Tenderness: There is no abdominal tenderness.  Musculoskeletal:        General: No swelling.     Cervical back: Neck supple.  Lymphadenopathy:     Cervical: No cervical adenopathy.  Skin:    General: Skin is warm and dry.     Findings: No rash.  Neurological:     General: No focal deficit present.     Mental Status: She is  alert.  Psychiatric:        Mood and Affect: Mood normal.        Behavior: Behavior normal.     LABORATORY DATA:  CBC    Component Value Date/Time   WBC 6.0 01/05/2022 1240   WBC 6.7 07/25/2021 0111   RBC 4.07 01/05/2022 1240   HGB 12.2 01/05/2022 1240   HGB 15.7 07/07/2021 1538   HCT 37.8 01/05/2022 1240   HCT 46.6 07/07/2021 1538   PLT 476 (H) 01/05/2022 1240   PLT 450 07/07/2021 1538   MCV 92.9 01/05/2022 1240   MCV 88 07/07/2021 1538   MCH 30.0 01/05/2022 1240   MCHC 32.3 01/05/2022 1240   RDW 14.4 01/05/2022 1240   RDW 12.9 07/07/2021 1538   LYMPHSABS 0.5 (L) 01/05/2022 1240   LYMPHSABS 1.6 07/07/2021 1538   MONOABS 0.6 01/05/2022 1240   EOSABS 0.1 01/05/2022 1240   EOSABS 0.1 07/07/2021 1538   BASOSABS 0.0 01/05/2022 1240   BASOSABS 0.0 07/07/2021 1538    CMP     Component Value Date/Time   NA 138 01/05/2022 1240   NA 141 07/07/2021 1538   K 3.7 01/05/2022 1240   CL 106 01/05/2022 1240   CO2 27 01/05/2022 1240   GLUCOSE 165 (H) 01/05/2022 1240   BUN 12 01/05/2022 1240   BUN 9 07/07/2021 1538   CREATININE 0.60 01/05/2022 1240   CALCIUM 10.0 01/05/2022 1240   PROT 6.8 01/05/2022 1240   PROT 7.9 07/07/2021 1538   ALBUMIN 3.6 01/05/2022 1240   ALBUMIN 4.6  07/07/2021 1538   AST 10 (L) 01/05/2022 1240   ALT 9 01/05/2022 1240   ALKPHOS 78 01/05/2022 1240   BILITOT 0.3 01/05/2022 1240   GFRNONAA >60 01/05/2022 1240   GFRAA 103 03/05/2020 1119         ASSESSMENT and THERAPY PLAN:   Malignant neoplasm of unspecified part of unspecified bronchus or lung ([place]) This is a 76 year old patient with 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  She also appears to have had some regional adenopathy including prevascular, AP window and left hilar adenopathy.  She has multiple intracranial metastatic lesions which are likely secondary to metastatic lung cancer given presence of regional adenopathy.  She is now status post SRS to the brain lesions.  She started concurrent chemoradiation with CarboTaxol on August 30, 2021.  She completed chemoradiation on October 15, 2021.  She is here for follow-up.  She is now on single agent Keytruda every 3 weeks.  She is tolerating this very well.  On exam today, she has been doing remarkably well.  The right breast exam shows response, the mass is smaller and softer.   End of treatment PET/CT reviewed consistent with response in the lung, likely postradiation changes noted.  The breast mass also appears smaller.  MRI brain scheduled for.  If she continues to have response in the breast and no evidence of disease in her next CT/PET imaging in about 3 months then we will review if there is any role for breast surgery.   CBC reviewed, satisfactory to proceed with immunotherapy.  Thank you for consulting [name] in the care of this patient.  Please do not hesitate to contact us with any additional questions or concerns.  All questions were answered. The patient knows to call the clinic with any problems, questions or concerns. We can certainly see the patient much sooner if necessary.  Total  encounter time:30 minutes*in face-to-face visit time,  chart review, lab review, care coordination, order entry, and documentation of the encounter time.  *Total Encounter Time as defined by the Centers for Medicare and Medicaid Services includes, in addition to the face-to-face time of a patient visit (documented in the note above) non-face-to-face time: obtaining and reviewing outside history, ordering and reviewing medications, tests or procedures, care coordination (communications with other health care professionals or caregivers) and documentation in the medical record.

## 2022-01-06 ENCOUNTER — Ambulatory Visit (INDEPENDENT_AMBULATORY_CARE_PROVIDER_SITE_OTHER): Payer: Medicare Other | Admitting: Family Medicine

## 2022-01-06 ENCOUNTER — Encounter: Payer: Self-pay | Admitting: Family Medicine

## 2022-01-06 VITALS — BP 104/58 | HR 100 | Temp 98.8°F | Ht 68.0 in | Wt 161.0 lb

## 2022-01-06 DIAGNOSIS — E1165 Type 2 diabetes mellitus with hyperglycemia: Secondary | ICD-10-CM

## 2022-01-06 DIAGNOSIS — E782 Mixed hyperlipidemia: Secondary | ICD-10-CM

## 2022-01-06 DIAGNOSIS — C7931 Secondary malignant neoplasm of brain: Secondary | ICD-10-CM | POA: Diagnosis not present

## 2022-01-06 DIAGNOSIS — I10 Essential (primary) hypertension: Secondary | ICD-10-CM | POA: Diagnosis not present

## 2022-01-06 DIAGNOSIS — L608 Other nail disorders: Secondary | ICD-10-CM | POA: Diagnosis not present

## 2022-01-06 DIAGNOSIS — C3490 Malignant neoplasm of unspecified part of unspecified bronchus or lung: Secondary | ICD-10-CM

## 2022-01-06 LAB — BAYER DCA HB A1C WAIVED: HB A1C (BAYER DCA - WAIVED): 5.6 % (ref 4.8–5.6)

## 2022-01-06 MED ORDER — INSULIN PEN NEEDLE 31G X 5 MM MISC: 1.0000 | Freq: Every day | 1 refills | Status: DC

## 2022-01-06 NOTE — Patient Instructions (Signed)
Drop insulin down to 6 units daily. Keep log of blood sugars and call with reading next Thursday.

## 2022-01-06 NOTE — Progress Notes (Signed)
Established Patient Office Visit  Subjective   Patient ID: Jennifer Cowan, female    DOB: 1945/02/27  Age: 76 y.o. MRN: [id number]  Chief Complaint  Patient presents with   Medical Management of Chronic Issues    3 month    HPI Here with family today.  DM Pt presents for follow up evaluation of Type 2 diabetes mellitus.  Current symptoms include none. Patient denies foot ulcerations, hypoglycemia , increased appetite, nausea, paresthesia of the feet, polydipsia, polyuria, visual disturbances, vomiting, and weight loss.  Current diabetic medications include: Lantus 8 units at bedtime. She has not needed her novolin.  Compliant with meds - Yes  Current monitoring regimen: home blood tests - daily Home blood sugar records:  overall 80-120 Any episodes of hypoglycemia? One blood sugar of 77  Urine microalbumin UTD? No Is She on ACE inhibitor or angiotensin II receptor blocker?  Yes, benazepril Is She on statin? Yes atorvastatin 80 Is She on ASA 81 mg daily?  Yes  They would like a referral to podiatry.   2. HTN Complaint with meds - Yes Current Medications - amlodipine 10, benazepril 10 mg Pertinent ROS:  Visual Disturbances - No Chest pain - No Dyspnea - No Palpitations - No LE edema - No  3. HLD On atorvastatin and zetia.   4. Cancer She continues to follow up with oncology. She had a CBC, CMP, TSH, and T4 done yesterday. She is no longer on decadron.    Past Medical History:  Diagnosis Date   Anemia    Arthritis    Breast cancer ([place])    Cancer ([place]) 1999   Breast Cancer   Hyperlipidemia    Hypertension    Osteopenia 01/12/2021      ROS As per HPI.    Objective:     BP (!) 104/58   Pulse 100   Temp 98.8 F (37.1 C)   Ht 5\' 8"  (1.727 m)   Wt 161 lb (73 kg)   SpO2 97%   BMI 24.48 kg/m  BP Readings from Last 3 Encounters:  01/06/22 (!) 104/58  01/05/22 112/79  12/28/21 (!) 100/59      Physical Exam Vitals and nursing note reviewed.   Constitutional:      General: She is not in acute distress.    Appearance: She is not ill-appearing, toxic-appearing or diaphoretic.  Eyes:     General: No scleral icterus.       Right eye: No discharge.        Left eye: No discharge.  Neck:     Thyroid: No thyroid mass, thyromegaly or thyroid tenderness.     Vascular: No carotid bruit.  Cardiovascular:     Rate and Rhythm: Normal rate and regular rhythm.     Heart sounds: Normal heart sounds. No murmur heard. Pulmonary:     Effort: Pulmonary effort is normal. No respiratory distress.     Breath sounds: Normal breath sounds.  Abdominal:     General: Bowel sounds are normal. There is no distension.     Palpations: Abdomen is soft.     Tenderness: There is no abdominal tenderness. There is no guarding or rebound.  Musculoskeletal:     Cervical back: Neck supple. No rigidity.     Right lower leg: No edema.     Left lower leg: No edema.  Skin:    General: Skin is warm and dry.  Neurological:     General: No focal deficit present.  Mental Status: She is alert and oriented to person, place, and time.     Motor: No weakness.     Gait: Gait normal.  Psychiatric:        Attention and Perception: Attention normal.        Mood and Affect: Affect is flat.        Behavior: Behavior normal.      No results found for any visits on 01/06/22.  Last CBC Lab Results  Component Value Date   WBC 6.0 01/05/2022   HGB 12.2 01/05/2022   HCT 37.8 01/05/2022   MCV 92.9 01/05/2022   MCH 30.0 01/05/2022   RDW 14.4 01/05/2022   PLT 476 (H) 73/71/0626   Last metabolic panel Lab Results  Component Value Date   GLUCOSE 165 (H) 01/05/2022   NA 138 01/05/2022   K 3.7 01/05/2022   CL 106 01/05/2022   CO2 27 01/05/2022   BUN 12 01/05/2022   CREATININE 0.60 01/05/2022   GFRNONAA >60 01/05/2022   CALCIUM 10.0 01/05/2022   PROT 6.8 01/05/2022   ALBUMIN 3.6 01/05/2022   LABGLOB 3.3 07/07/2021   AGRATIO 1.4 07/07/2021   BILITOT 0.3  01/05/2022   ALKPHOS 78 01/05/2022   AST 10 (L) 01/05/2022   ALT 9 01/05/2022   ANIONGAP 5 01/05/2022   Last lipids Lab Results  Component Value Date   CHOL 197 10/06/2021   HDL 27 (L) 10/06/2021   LDLCALC 146 (H) 10/06/2021   TRIG 130 10/06/2021   CHOLHDL 7.3 (H) 10/06/2021   Last hemoglobin A1c Lab Results  Component Value Date   HGBA1C 8.1 (H) 10/06/2021   Last thyroid functions Lab Results  Component Value Date   TSH 2.510 01/05/2022   T4TOTAL 6.9 12/28/2021      The ASCVD Risk score (Arnett DK, et al., 2019) failed to calculate for the following reasons:   The patient has a prior MI or stroke diagnosis    Assessment & Plan:   Jennifer Cowan was seen today for medical management of chronic issues.  Diagnoses and all orders for this visit:  Type 2 diabetes mellitus with hyperglycemia, without long-term current use of insulin (H[facility]) A1c is 5.6 today. Discussed decreasing basal insulin to 6 units. Keep BP log and call next week to discuss titrating down further vs switching to oral medication. Reviewed labs from yesterday. On ACE, statin, aspirin. Foot exam today. Referral to podiatry as requested.  -     Bayer DCA Hb A1c Waived -     Microalbumin / creatinine urine ratio -     Insulin Pen Needle 31G X 5 MM MISC; 1 each by Does not apply route daily. -     Cancel: Ambulatory referral to Podiatry -     Ambulatory referral to Podiatry  Discoloration and thickening of nails both feet -     Ambulatory referral to Podiatry  Mixed hyperlipidemia On atorvastatin and zetia.   Primary hypertension Well controlled on current regimen. Continue benazepril and amlodipine.   Malignant neoplasm of unspecified part of unspecified bronchus or lung ([place]) Metastasis to brain [facility]) Managed by oncology.   Return in about 3 months (around 04/07/2022) for chronic follow up.   The patient indicates understanding of these issues and agrees with the plan.  [name], FNP

## 2022-01-07 LAB — MICROALBUMIN / CREATININE URINE RATIO
Creatinine, Urine: 168.7 mg/dL
Microalb/Creat Ratio: 18 mg/g creat (ref 0–29)
Microalbumin, Urine: 30.9 ug/mL

## 2022-01-07 LAB — T4: T4, Total: 7.2 ug/dL (ref 4.5–12.0)

## 2022-01-13 ENCOUNTER — Ambulatory Visit
Admission: RE | Admit: 2022-01-13 | Discharge: 2022-01-13 | Disposition: A | Discharge status: Home / Self Care | Payer: Medicare Other | Source: Ambulatory Visit | Attending: Internal Medicine | Admitting: Internal Medicine

## 2022-01-13 ENCOUNTER — Telehealth: Payer: Self-pay | Admitting: Family Medicine

## 2022-01-13 DIAGNOSIS — I639 Cerebral infarction, unspecified: Secondary | ICD-10-CM | POA: Diagnosis not present

## 2022-01-13 DIAGNOSIS — C7931 Secondary malignant neoplasm of brain: Secondary | ICD-10-CM

## 2022-01-13 MED ORDER — SODIUM CHLORIDE 0.9% FLUSH
10.0000 mL | INTRAVENOUS | Status: DC | PRN
  Administered 2022-01-13: 10 mL via INTRAVENOUS

## 2022-01-13 MED ORDER — HEPARIN SOD (PORK) LOCK FLUSH 100 UNIT/ML IV SOLN
500.0000 [IU] | Freq: Once | INTRAVENOUS | Status: AC
  Administered 2022-01-13: 500 [IU] via INTRAVENOUS

## 2022-01-13 MED ORDER — GADOPICLENOL 0.5 MMOL/ML IV SOLN
7.0000 mL | Freq: Once | INTRAVENOUS | Status: AC | PRN
  Administered 2022-01-13: 7 mL via INTRAVENOUS

## 2022-01-13 NOTE — Telephone Encounter (Signed)
Have her drop down to 4 units of insulin daily. If fasting blood sugars stay <140 after 2-3 days, she can then drop down to 2 units daily over the weekend. Call on Monday with amount of insulin and blood sugar log.

## 2022-01-13 NOTE — Telephone Encounter (Signed)
Patients sister called to give PCP patients BS readings since starting lower insulin dosage last week. Readings are for Morning, Mid Day, and Night.  01/07/2022 106, 147,103  01/08/2022 98, 123, 93  01/09/2022 101, 85, 110  01/10/2022 99, 86, 111  01/11/2022 102, 102, 96  01/12/2022 154, 86, 92  01/13/2022 110

## 2022-01-14 NOTE — Telephone Encounter (Signed)
Patient's sister calling back. Please return call.

## 2022-01-18 ENCOUNTER — Ambulatory Visit: Payer: Medicare Other | Admitting: Hematology and Oncology

## 2022-01-18 ENCOUNTER — Other Ambulatory Visit: Payer: Medicare Other

## 2022-01-18 ENCOUNTER — Ambulatory Visit: Payer: Medicare Other

## 2022-01-18 ENCOUNTER — Telehealth: Payer: Self-pay | Admitting: Hematology and Oncology

## 2022-01-18 NOTE — Telephone Encounter (Signed)
Called per 12/29 high priority in basket. R/s patient appointments to days where patient does not have to travel more than once a week/month. Spoke with niece. Patient will be notified.

## 2022-01-20 ENCOUNTER — Ambulatory Visit: Payer: Medicare Other | Admitting: Internal Medicine

## 2022-01-20 ENCOUNTER — Encounter: Payer: Medicare Other | Admitting: Nurse Practitioner

## 2022-01-24 ENCOUNTER — Inpatient Hospital Stay: Payer: 59 | Attending: Hematology and Oncology

## 2022-01-24 DIAGNOSIS — Z79899 Other long term (current) drug therapy: Secondary | ICD-10-CM | POA: Insufficient documentation

## 2022-01-24 DIAGNOSIS — C7931 Secondary malignant neoplasm of brain: Secondary | ICD-10-CM | POA: Insufficient documentation

## 2022-01-24 DIAGNOSIS — Z5111 Encounter for antineoplastic chemotherapy: Secondary | ICD-10-CM | POA: Insufficient documentation

## 2022-01-24 DIAGNOSIS — C3490 Malignant neoplasm of unspecified part of unspecified bronchus or lung: Secondary | ICD-10-CM | POA: Insufficient documentation

## 2022-01-26 ENCOUNTER — Other Ambulatory Visit: Payer: Self-pay

## 2022-01-26 ENCOUNTER — Encounter: Payer: Self-pay | Admitting: Nurse Practitioner

## 2022-01-26 ENCOUNTER — Inpatient Hospital Stay (HOSPITAL_BASED_OUTPATIENT_CLINIC_OR_DEPARTMENT_OTHER): Payer: 59 | Admitting: Nurse Practitioner

## 2022-01-26 ENCOUNTER — Inpatient Hospital Stay: Payer: 59

## 2022-01-26 ENCOUNTER — Other Ambulatory Visit: Payer: Self-pay | Admitting: Nurse Practitioner

## 2022-01-26 ENCOUNTER — Inpatient Hospital Stay (HOSPITAL_BASED_OUTPATIENT_CLINIC_OR_DEPARTMENT_OTHER): Payer: 59 | Admitting: Hematology and Oncology

## 2022-01-26 ENCOUNTER — Other Ambulatory Visit (HOSPITAL_COMMUNITY): Payer: Self-pay

## 2022-01-26 VITALS — BP 111/55 | HR 97 | Resp 16

## 2022-01-26 VITALS — BP 114/65 | HR 99 | Temp 98.0°F | Resp 15 | Wt 165.6 lb

## 2022-01-26 VITALS — BP 124/65 | HR 99 | Temp 97.8°F | Resp 16 | Ht 68.0 in | Wt 162.9 lb

## 2022-01-26 DIAGNOSIS — Z515 Encounter for palliative care: Secondary | ICD-10-CM | POA: Diagnosis not present

## 2022-01-26 DIAGNOSIS — Z5111 Encounter for antineoplastic chemotherapy: Secondary | ICD-10-CM | POA: Diagnosis not present

## 2022-01-26 DIAGNOSIS — Z79899 Other long term (current) drug therapy: Secondary | ICD-10-CM | POA: Diagnosis not present

## 2022-01-26 DIAGNOSIS — Z95828 Presence of other vascular implants and grafts: Secondary | ICD-10-CM

## 2022-01-26 DIAGNOSIS — R530 Neoplastic (malignant) related fatigue: Secondary | ICD-10-CM | POA: Diagnosis not present

## 2022-01-26 DIAGNOSIS — C3490 Malignant neoplasm of unspecified part of unspecified bronchus or lung: Secondary | ICD-10-CM

## 2022-01-26 DIAGNOSIS — R739 Hyperglycemia, unspecified: Secondary | ICD-10-CM

## 2022-01-26 DIAGNOSIS — M792 Neuralgia and neuritis, unspecified: Secondary | ICD-10-CM

## 2022-01-26 DIAGNOSIS — C7931 Secondary malignant neoplasm of brain: Secondary | ICD-10-CM | POA: Diagnosis not present

## 2022-01-26 LAB — CMP (CANCER CENTER ONLY)
ALT: 10 U/L (ref 0–44)
AST: 11 U/L — ABNORMAL LOW (ref 15–41)
Albumin: 3.7 g/dL (ref 3.5–5.0)
Alkaline Phosphatase: 76 U/L (ref 38–126)
Anion gap: 4 — ABNORMAL LOW (ref 5–15)
BUN: 12 mg/dL (ref 8–23)
CO2: 28 mmol/L (ref 22–32)
Calcium: 9.7 mg/dL (ref 8.9–10.3)
Chloride: 108 mmol/L (ref 98–111)
Creatinine: 0.5 mg/dL (ref 0.44–1.00)
GFR, Estimated: >60 mL/min (ref >60)
Glucose, Bld: 80 mg/dL (ref 70–99)
Potassium: 3.7 mmol/L (ref 3.5–5.1)
Sodium: 140 mmol/L (ref 135–145)
Total Bilirubin: 0.3 mg/dL (ref 0.3–1.2)
Total Protein: 6.7 g/dL (ref 6.5–8.1)

## 2022-01-26 LAB — CBC WITH DIFFERENTIAL (CANCER CENTER ONLY)
Abs Immature Granulocytes: 0.02 10*3/uL (ref 0.00–0.07)
Basophils Absolute: 0 10*3/uL (ref 0.0–0.1)
Basophils Relative: 0 %
Eosinophils Absolute: 0 10*3/uL (ref 0.0–0.5)
Eosinophils Relative: 1 %
HCT: 39.1 % (ref 36.0–46.0)
Hemoglobin: 12.8 g/dL (ref 12.0–15.0)
Immature Granulocytes: 0 %
Lymphocytes Relative: 9 %
Lymphs Abs: 0.6 10*3/uL — ABNORMAL LOW (ref 0.7–4.0)
MCH: 29.8 pg (ref 26.0–34.0)
MCHC: 32.7 g/dL (ref 30.0–36.0)
MCV: 90.9 fL (ref 80.0–100.0)
Monocytes Absolute: 0.7 10*3/uL (ref 0.1–1.0)
Monocytes Relative: 12 %
Neutro Abs: 4.6 10*3/uL (ref 1.7–7.7)
Neutrophils Relative %: 78 %
Platelet Count: 419 10*3/uL — ABNORMAL HIGH (ref 150–400)
RBC: 4.3 MIL/uL (ref 3.87–5.11)
RDW: 14.6 % (ref 11.5–15.5)
WBC Count: 6 10*3/uL (ref 4.0–10.5)
nRBC: 0 % (ref 0.0–0.2)

## 2022-01-26 MED ORDER — SODIUM CHLORIDE 0.9 % IV SOLN
200.0000 mg | Freq: Once | INTRAVENOUS | Status: AC
  Administered 2022-01-26: 200 mg via INTRAVENOUS
  Filled 2022-01-26: qty 200

## 2022-01-26 MED ORDER — LIDOCAINE 5 % EX PTCH: 1.0000 | MEDICATED_PATCH | CUTANEOUS | 1 refills | Status: DC

## 2022-01-26 MED ORDER — ACCU-CHEK GUIDE VI STRP
ORAL_STRIP | 1 refills | Status: DC
  Filled 2022-01-26: qty 100, 25d supply, fill #0
  Filled 2022-03-03: qty 100, 25d supply, fill #1

## 2022-01-26 MED ORDER — SODIUM CHLORIDE 0.9% FLUSH
10.0000 mL | INTRAVENOUS | Status: DC | PRN
  Administered 2022-01-26: 10 mL

## 2022-01-26 MED ORDER — SODIUM CHLORIDE 0.9 % IV SOLN: Freq: Once | INTRAVENOUS | Status: AC

## 2022-01-26 MED ORDER — HEPARIN SOD (PORK) LOCK FLUSH 100 UNIT/ML IV SOLN
500.0000 [IU] | Freq: Once | INTRAVENOUS | Status: AC | PRN
  Administered 2022-01-26: 500 [IU]

## 2022-01-26 MED ORDER — SODIUM CHLORIDE 0.9% FLUSH
10.0000 mL | Freq: Once | INTRAVENOUS | Status: AC
  Administered 2022-01-26: 10 mL

## 2022-01-26 NOTE — Progress Notes (Signed)
[facility] Follow up:    [name], [place] [address]   DIAGNOSIS:  Cancer Staging  No matching staging information was found for the patient.  SUMMARY OF ONCOLOGIC HISTORY: Oncology History  Malignant neoplasm of unspecified part of unspecified bronchus or lung ([place])  08/13/2021 Initial Diagnosis   Malignant neoplasm of unspecified part of unspecified bronchus or lung ([place])   08/30/2021 - 09/13/2021 Chemotherapy   Patient is on Treatment Plan : LUNG Carboplatin / Paclitaxel + XRT q7d     08/30/2021 - 10/11/2021 Chemotherapy   Patient is on Treatment Plan : LUNG Carboplatin + Paclitaxel + XRT q7d     11/16/2021 -  Chemotherapy   Patient is on Treatment Plan : LUNG Pembrolizumab Q 21D       CURRENT THERAPY: Pembrolizumab  INTERVAL HISTORY:  DAELYNN BLOWER 77 y.o. female returns for follow-up of her breast and lung cancer.   She is on maintenance immunotherapy.  Last imaging 01/03/2022 showed response in LUL mass, AP window and prevascular LN. She denies any new complaints except for pain in the area of the shingles rash. She says the right breast mass is likely the same. No change in breathing. She is moving around well at home. No change in bowel habits or urinary habits. Rest of the pertinent 10 point ROS reviewed and neg.  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  MEDICAL HISTORY: Past Medical History:  Diagnosis Date   Anemia    Arthritis    Breast cancer ([place])    Cancer ([place]) 1999   Breast Cancer   Hyperlipidemia    Hypertension    Osteopenia 01/12/2021    SURGICAL HISTORY: Past Surgical History:  Procedure Laterality Date   BREAST SURGERY  1999   Masectomy- left   BRONCHIAL BIOPSY  07/30/2021   Procedure: BRONCHIAL BIOPSIES;  Surgeon: Collene Gobble, MD;  Location[facility] ENDOSCOPY;  Service: Pulmonary;;   BRONCHIAL BRUSHINGS  07/30/2021   Procedure: BRONCHIAL BRUSHINGS;  Surgeon: Collene Gobble, MD;  Location: [facility] ENDOSCOPY;  Service: Pulmonary;;   BRONCHIAL NEEDLE ASPIRATION BIOPSY  07/30/2021   Procedure: BRONCHIAL NEEDLE ASPIRATION BIOPSIES;  Surgeon: Collene Gobble, MD;  Location: [facility] ENDOSCOPY;  Service: Pulmonary;;   CYSTECTOMY     cyst removed off top of head   IR IMAGING GUIDED PORT INSERTION  08/20/2021   MASTECTOMY     VIDEO BRONCHOSCOPY WITH RADIAL ENDOBRONCHIAL ULTRASOUND  07/30/2021   Procedure: VIDEO BRONCHOSCOPY WITH RADIAL ENDOBRONCHIAL ULTRASOUND;  Surgeon: Collene Gobble, MD;  Location: [facility] ENDOSCOPY;  Service: Pulmonary;;    SOCIAL HISTORY: Social History   Socioeconomic History   Marital status: Single  Spouse name: Not on file   Number of children: 0   Years of education: 12   Highest education level: High school graduate  Occupational History   Occupation: retired  Tobacco Use   Smoking status: Never   Smokeless tobacco: Never  Vaping Use   Vaping Use: Never used  Substance and Sexual Activity   Alcohol use: No   Drug use: Never   Sexual activity: Yes    Birth control/protection: Post-menopausal  Other Topics Concern   Not on file  Social History  Narrative   Not on file   Social Determinants of Health   Financial Resource Strain: Not on file  Food Insecurity: Not on file  Transportation Needs: Not on file  Physical Activity: Not on file  Stress: Not on file  Social Connections: Not on file  Intimate Partner Violence: Not on file    FAMILY HISTORY: Family History  Problem Relation Age of Onset   Arthritis Mother    Breast cancer Sister    Cancer Sister        Breast Cancer   Diabetes Sister    Hyperlipidemia Sister    Hypertension Sister    Stroke Sister    Diabetes Brother    Hypertension Brother     Review of Systems  Constitutional:  Positive for fatigue. Negative for appetite change, chills, fever and unexpected weight change.  HENT:   Negative for hearing loss, lump/mass and trouble swallowing.   Eyes:  Negative for eye problems and icterus.  Respiratory:  Negative for chest tightness, cough and shortness of breath.   Cardiovascular:  Negative for chest pain, leg swelling and palpitations.  Gastrointestinal:  Negative for abdominal distention, abdominal pain, constipation, diarrhea, nausea and vomiting.  Endocrine: Negative for hot flashes.  Genitourinary:  Negative for difficulty urinating.   Musculoskeletal:  Negative for arthralgias.  Skin:  Negative for itching and rash.  Neurological:  Negative for dizziness, extremity weakness, headaches and numbness.  Hematological:  Negative for adenopathy. Does not bruise/bleed easily.  Psychiatric/Behavioral:  Negative for depression. The patient is not nervous/anxious.       PHYSICAL EXAMINATION  ECOG PERFORMANCE STATUS: 3 - Symptomatic, >50% confined to bed  Vitals:   01/26/22 1346  BP: 124/65  Pulse: 99  Resp: 16  Temp: 97.8 F (36.6 C)  SpO2: 98%     Physical Exam Constitutional:      General: She is not in acute distress.    Appearance: Normal appearance. She is not toxic-appearing.  HENT:     Head: Normocephalic and atraumatic.  Eyes:      General: No scleral icterus. Cardiovascular:     Rate and Rhythm: Normal rate and regular rhythm.     Pulses: Normal pulses.     Heart sounds: Normal heart sounds.  Pulmonary:     Effort: Pulmonary effort is normal.     Breath sounds: Normal breath sounds.  Chest:       Comments: Right breast mass appears larger on exam today. No def palpable mass but there is left supraclavicular fullness. Abdominal:     General: Abdomen is flat. Bowel sounds are normal. There is no distension.     Palpations: Abdomen is soft.     Tenderness: There is no abdominal tenderness.  Musculoskeletal:        General: No swelling.     Cervical back: Neck supple.  Skin:    General: Skin is warm and dry.     Findings: No rash.  Neurological:     General: No focal deficit present.     Mental Status: She is alert.  Psychiatric:        Mood and Affect: Mood normal.        Behavior: Behavior normal.     LABORATORY DATA:  CBC    Component Value Date/Time   WBC 6.0 01/26/2022 1330   WBC 6.7 07/25/2021 0111   RBC 4.30 01/26/2022 1330   HGB 12.8 01/26/2022 1330   HGB 15.7 07/07/2021 1538   HCT 39.1 01/26/2022 1330   HCT 46.6 07/07/2021 1538   PLT 419 (H) 01/26/2022 1330   PLT 450 07/07/2021 1538   MCV 90.9 01/26/2022 1330   MCV 88 07/07/2021 1538   MCH 29.8 01/26/2022 1330   MCHC 32.7 01/26/2022 1330   RDW 14.6 01/26/2022 1330   RDW 12.9 07/07/2021 1538   LYMPHSABS 0.6 (L) 01/26/2022 1330   LYMPHSABS 1.6 07/07/2021 1538   MONOABS 0.7 01/26/2022 1330   EOSABS 0.0 01/26/2022 1330   EOSABS 0.1 07/07/2021 1538   BASOSABS 0.0 01/26/2022 1330   BASOSABS 0.0 07/07/2021 1538    CMP     Component Value Date/Time   NA 140 01/26/2022 1330   NA 141 07/07/2021 1538   K 3.7 01/26/2022 1330   CL 108 01/26/2022 1330   CO2 28 01/26/2022 1330   GLUCOSE 80 01/26/2022 1330   BUN 12 01/26/2022 1330   BUN 9 07/07/2021 1538   CREATININE 0.50 01/26/2022 1330   CALCIUM 9.7 01/26/2022 1330   PROT 6.7  01/26/2022 1330   PROT 7.9 07/07/2021 1538   ALBUMIN 3.7 01/26/2022 1330   ALBUMIN 4.6 07/07/2021 1538   AST 11 (L) 01/26/2022 1330   ALT 10 01/26/2022 1330   ALKPHOS 76 01/26/2022 1330   BILITOT 0.3 01/26/2022 1330   GFRNONAA >60 01/26/2022 1330   GFRAA 103 03/05/2020 1119         ASSESSMENT and THERAPY PLAN:   Malignant neoplasm of unspecified part of unspecified bronchus or lung ([place]) This is a 77 year old patient with 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  She also appears to have had some regional adenopathy including prevascular, AP window and left hilar adenopathy.  She has multiple intracranial metastatic lesions which are likely secondary to metastatic lung cancer given presence of regional adenopathy.  She is now status post SRS to the brain lesions. She started concurrent chemoradiation with CarboTaxol on August 30, 2021.  She completed chemoradiation on October 15, 2021.  She is now on single agent Keytruda every 3 weeks.   End of treatment PET/CT reviewed consistent with response in the lung, likely postradiation changes noted. PET also commented on decrease in size of the breast mass. She is here before her cycle of immunotherapy, there appears to be some left supraclavicular fullness on exam today and also increased size of the breast mass. I have ordered CT chest with attention to left supraclavicular fossa. Labs reviewed, ok to proceed with immunotherapy as scheduled. She will RTC before her next immunotherapy.  Thank you for consulting [name] in the care of this patient.  Please do not hesitate to contact us with any additional questions or concerns.  All questions were answered. The patient knows to call the clinic with any problems, questions or concerns. We can certainly see the patient much sooner if necessary.  Total encounter time:30 minutes*in face-to-face visit time, chart  review, lab review, care  coordination, order entry, and documentation of the encounter time.  *Total Encounter Time as defined by the Centers for Medicare and Medicaid Services includes, in addition to the face-to-face time of a patient visit (documented in the note above) non-face-to-face time: obtaining and reviewing outside history, ordering and reviewing medications, tests or procedures, care coordination (communications with other health care professionals or caregivers) and documentation in the medical record.

## 2022-01-26 NOTE — Progress Notes (Signed)
[facility]  Telephone:([phone]) [phone] Fax:(336) [phone]   Name: Jennifer Cowan Date: 01/26/2022 MRN: [id number]  DOB: Apr 13, 1945  Patient Care Team: [name], FNP as PCP - General (Family Medicine) Pickenpack-Cousar, Carlena Sax, NP as Nurse Practitioner (Nurse Practitioner)    INTERVAL HISTORY: Jennifer Cowan is a 77 y.o. 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 Palliative ask to see for symptom management and goals of care.   SOCIAL HISTORY:     reports that she has never smoked. She has never used smokeless tobacco. She reports that she does not drink alcohol and does not use drugs.  ADVANCE DIRECTIVES:  Patient does not have an advanced directive however is interested in completing.   CODE STATUS:   PAST MEDICAL HISTORY: Past Medical History:  Diagnosis Date   Anemia    Arthritis    Breast cancer ([place])    Cancer ([place]) 1999   Breast Cancer   Hyperlipidemia    Hypertension    Osteopenia 01/12/2021    ALLERGIES:  has No Known Allergies.  MEDICATIONS:  Current Outpatient Medications  Medication Sig Dispense Refill   Accu-Chek Softclix Lancets lancets Use to check blood sugar as directed 4 times a day 100 each 1   acetaminophen (TYLENOL) 500 MG tablet Take 1,000 mg by mouth every 6 (six) hours as needed for moderate pain.     amLODipine (NORVASC) 10 MG tablet TAKE 1 TABLET BY MOUTH DAILY 90 tablet 3   aspirin EC 325 MG tablet Take 1 tablet (325 mg total) by mouth daily. 60 tablet 2   atorvastatin (LIPITOR) 80 MG tablet Take 1 tablet (80 mg total) by mouth daily. 90 tablet 3   benazepril (LOTENSIN) 10 MG tablet TAKE 1 TABLET BY MOUTH DAILY 90 tablet 3   blood glucose meter kit and supplies KIT Dispense based on patient and insurance preference. Use up to four times  daily as directed. 1 each 0   Blood Glucose Monitoring Suppl (ACCU-CHEK GUIDE) w/Device KIT Use as directed 4 times a day 1 kit 0   Cholecalciferol (VITAMIN D3) 125 MCG (5000 UT) CAPS Take 5,000 Units by mouth daily.     dexamethasone (DECADRON) 4 MG tablet Take 1 tablet (4 mg total) by mouth 3 (three) times daily. 90 tablet 1   ezetimibe (ZETIA) 10 MG tablet Take 1 tablet (10 mg total) by mouth daily. 90 tablet 3   glucose 4 GM chewable tablet Chew 1 tablet (4 g total) by mouth as needed for low blood sugar. 50 tablet 0   glucose blood (ACCU-CHEK GUIDE) test strip Use as directed 4 times a day 100 each 1   insulin glargine (LANTUS SOLOSTAR) 100 UNIT/ML Solostar Pen Inject 8 Units into the skin at bedtime. 15 mL 1   Insulin Pen Needle 31G X 5 MM MISC 1 each by Does not apply route daily. 90 each 1   insulin regular (NOVOLIN R) 100 units/mL injection If blood sugar is 200 or less: 0 units 201-250: 2 units 251-300: 4 units 301-350: 6 units 351-400: 8 units 401 or higher: 10 units and call MD 10 mL 1   Insulin Syringe-Needle U-100 (INSULIN SYRINGE .3CC/29GX1") 29G X 1" 0.3 ML MISC 1 Dose by Does not apply route 3 (three) times daily before meals. 90 each 1   lidocaine (LIDODERM) 5 % Place 1  patch onto the skin daily. Remove & Discard patch within 12 hours or as directed by MD 30 patch 0   LORazepam (ATIVAN) 0.5 MG tablet 1 tab po 30 minutes prior to radiation or MRI scans 30 tablet 0   meloxicam (MOBIC) 7.5 MG tablet TAKE 1 TABLET BY MOUTH DAILY 90 tablet 3   potassium chloride (MICRO-K) 10 MEQ CR capsule Take 2 capsules (20 mEq total) by mouth daily. 60 capsule 1   prochlorperazine (COMPAZINE) 10 MG tablet Take 1 tablet (10 mg total) by mouth every 6 (six) hours as needed for nausea or vomiting. 30 tablet 1   sucralfate (CARAFATE) 1 g tablet Take 1 tablet (1 g total) by mouth 4 (four) times daily -  with meals and at bedtime. Crush 1 tablet in 1 oz water and drink 5 min before meals for radiation  induced esophagitis 120 tablet 2   trolamine salicylate (ASPERCREME) 10 % cream Apply 1 Application topically daily as needed for muscle pain.     valACYclovir (VALTREX) 500 MG tablet Take 1 tablet (500 mg total) by mouth 2 (two) times daily. 20 tablet 0   Current Facility-Administered Medications  Medication Dose Route Frequency Provider Last Rate Last Admin   heparin lock flush 100 unit/mL  500 Units Intravenous Once Kyung Rudd, MD       sodium chloride flush (NS) 0.9 % injection 10 mL  10 mL Intravenous Once Kyung Rudd, MD       Facility-Administered Medications Ordered in Other Visits  Medication Dose Route Frequency Provider Last Rate Last Admin   acetaminophen (TYLENOL) 325 MG tablet            cyanocobalamin (VITAMIN B12) 1000 MCG/ML injection            diphenhydrAMINE (BENADRYL) 25 mg capsule            gadopiclenol (VUEWAY) 0.5 MMOL/ML solution 7.5 mL  7.5 mL Intravenous Once PRN Tyler Pita, MD        VITAL SIGNS: There were no vitals taken for this visit. There were no vitals filed for this visit.   Estimated body mass index is 24.48 kg/m as calculated from the following:   Height as of 01/06/22: 5\' 8"  (1.727 m).   Weight as of 01/06/22: 161 lb (73 kg).   PERFORMANCE STATUS (ECOG) : 2 - Symptomatic, <50% confined to bed   Physical Exam General: NAD, in wheelchair  Cardiovascular: regular rate and rhythm Pulmonary: normal breathing pattern  Extremities: no edema, no joint deformities Skin: no rashes Neurological: AAO x3, mood appropriate   IMPRESSION: Jennifer Cowan presents to clinic today for follow-up. Her daughter is present. Patient in wheelchair. No acute distress noted. Daughter shares appreciation of how well patient is feeling. She is recovering without difficulty from shingles. Continues to use lidocaine patch for pain which is effective.   Appetite is much improved. Weight increased to 165lbs from 161 lbs on 12/21. Patient denies nausea, vomiting,  constipation, or diarrhea. Is taking daily stool softener. Sleeping well at night. Occasional naps during the day.   Patient and family aware we are available as needed for ongoing support and symptom management.   I discussed the importance of continued conversation with family and their medical providers regarding overall plan of care and treatment options, ensuring decisions are within the context of the patients values and GOCs.  PLAN: Ongoing goals of care and support. Continues to improve.  Lidocaine patch  No symptom needs at this time.  Will plan to follow-up in 4-6 weeks in collaboration with other oncology appointments.    Patient expressed understanding and was in agreement with this plan. She also understands that She can call the clinic at any time with any questions, concerns, or complaints.       Time Total: 20 min   Visit consisted of counseling and education dealing with the complex and emotionally intense issues of symptom management and palliative care in the setting of serious and potentially life-threatening illness.Greater than 50%  of this time was spent counseling and coordinating care related to the above assessment and plan.  Alda Lea, AGPCNP-BC  Palliative Medicine Team/[facility]

## 2022-01-26 NOTE — Assessment & Plan Note (Addendum)
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  She also appears to have had some regional adenopathy including prevascular, AP window and left hilar adenopathy.  She has multiple intracranial metastatic lesions which are likely secondary to metastatic lung cancer given presence of regional adenopathy.  She is now status post SRS to the brain lesions. She started concurrent chemoradiation with CarboTaxol on August 30, 2021.  She completed chemoradiation on October 15, 2021.  She is now on single agent Keytruda every 3 weeks.   End of treatment PET/CT reviewed consistent with response in the lung, likely postradiation changes noted. PET also commented on decrease in size of the breast mass. She is here before her cycle of immunotherapy, there appears to be some left supraclavicular fullness on exam today and also increased size of the breast mass. I have ordered CT chest with attention to left supraclavicular fossa. Labs reviewed, ok to proceed with immunotherapy as scheduled. She will RTC before her next immunotherapy.  Thank you for consulting [name] in the care of this patient.  Please do not hesitate to contact us with any additional questions or concerns.

## 2022-01-26 NOTE — Patient Instructions (Signed)
[facility] ONCOLOGY  Discharge Instructions: Thank you for choosing [facility] to provide your oncology and hematology care.   If you have a lab appointment with the [facility], please go directly to the [facility] and check in at the registration area.   Wear comfortable clothing and clothing appropriate for easy access to any Portacath or PICC line.   We strive to give you quality time with your provider. You may need to reschedule your appointment if you arrive late (15 or more minutes).  Arriving late affects you and other patients whose appointments are after yours.  Also, if you miss three or more appointments without notifying the office, you may be dismissed from the clinic at the provider's discretion.      For prescription refill requests, have your pharmacy contact our office and allow 72 hours for refills to be completed.    Today you received the following chemotherapy and/or immunotherapy agents: Keytruda      To help prevent nausea and vomiting after your treatment, we encourage you to take your nausea medication as directed.  BELOW ARE SYMPTOMS THAT SHOULD BE REPORTED IMMEDIATELY: *FEVER GREATER THAN 100.4 F (38 C) OR HIGHER *CHILLS OR SWEATING *NAUSEA AND VOMITING THAT IS NOT CONTROLLED WITH YOUR NAUSEA MEDICATION *UNUSUAL SHORTNESS OF BREATH *UNUSUAL BRUISING OR BLEEDING *URINARY PROBLEMS (pain or burning when urinating, or frequent urination) *BOWEL PROBLEMS (unusual diarrhea, constipation, pain near the anus) TENDERNESS IN MOUTH AND THROAT WITH OR WITHOUT PRESENCE OF ULCERS (sore throat, sores in mouth, or a toothache) UNUSUAL RASH, SWELLING OR PAIN  UNUSUAL VAGINAL DISCHARGE OR ITCHING   Items with * indicate a potential emergency and should be followed up as soon as possible or go to the Emergency Department if any problems should occur.  Please show the CHEMOTHERAPY ALERT CARD or IMMUNOTHERAPY ALERT CARD at check-in to  the Emergency Department and triage nurse.  Should you have questions after your visit or need to cancel or reschedule your appointment, please contact [facility]  Dept: [phone]  and follow the prompts.  Office hours are 8:00 a.m. to 4:30 p.m. Monday - Friday. Please note that voicemails left after 4:00 p.m. may not be returned until the following business day.  We are closed weekends and major holidays. You have access to a nurse at all times for urgent questions. Please call the main number to the clinic Dept: [phone] and follow the prompts.   For any non-urgent questions, you may also contact your provider using MyChart. We now offer e-Visits for anyone 44 and older to request care online for non-urgent symptoms. For details visit [email or web address].   Also download the MyChart app! Go to the app store, search "MyChart", open the app, select [facility], and log in with your MyChart username and password.

## 2022-01-31 DIAGNOSIS — C50919 Malignant neoplasm of unspecified site of unspecified female breast: Secondary | ICD-10-CM | POA: Diagnosis not present

## 2022-01-31 DIAGNOSIS — G9389 Other specified disorders of brain: Secondary | ICD-10-CM | POA: Diagnosis not present

## 2022-01-31 DIAGNOSIS — R5381 Other malaise: Secondary | ICD-10-CM | POA: Diagnosis not present

## 2022-01-31 DIAGNOSIS — R918 Other nonspecific abnormal finding of lung field: Secondary | ICD-10-CM | POA: Diagnosis not present

## 2022-02-04 ENCOUNTER — Encounter: Payer: Self-pay | Admitting: Hematology and Oncology

## 2022-02-12 ENCOUNTER — Other Ambulatory Visit: Payer: Self-pay | Admitting: Nurse Practitioner

## 2022-02-14 ENCOUNTER — Other Ambulatory Visit (HOSPITAL_COMMUNITY): Payer: Self-pay

## 2022-02-14 MED ORDER — ACCU-CHEK SOFTCLIX LANCETS MISC
1 refills | Status: DC
  Filled 2022-02-14 – 2022-02-19 (×2): qty 100, 25d supply, fill #0

## 2022-02-17 ENCOUNTER — Encounter: Payer: Self-pay | Admitting: Hematology and Oncology

## 2022-02-17 ENCOUNTER — Inpatient Hospital Stay: Payer: 59

## 2022-02-17 ENCOUNTER — Inpatient Hospital Stay (HOSPITAL_BASED_OUTPATIENT_CLINIC_OR_DEPARTMENT_OTHER): Payer: 59 | Admitting: Internal Medicine

## 2022-02-17 ENCOUNTER — Inpatient Hospital Stay: Payer: 59 | Attending: Hematology and Oncology | Admitting: Genetic Counselor

## 2022-02-17 ENCOUNTER — Inpatient Hospital Stay (HOSPITAL_BASED_OUTPATIENT_CLINIC_OR_DEPARTMENT_OTHER): Payer: 59 | Admitting: Hematology and Oncology

## 2022-02-17 ENCOUNTER — Other Ambulatory Visit: Payer: Self-pay

## 2022-02-17 ENCOUNTER — Encounter: Payer: Self-pay | Admitting: Genetic Counselor

## 2022-02-17 VITALS — BP 126/65 | HR 99 | Temp 97.8°F | Resp 16 | Ht 68.0 in | Wt 170.0 lb

## 2022-02-17 VITALS — BP 124/64 | HR 102 | Temp 97.2°F | Resp 18 | Wt 171.1 lb

## 2022-02-17 DIAGNOSIS — C7931 Secondary malignant neoplasm of brain: Secondary | ICD-10-CM

## 2022-02-17 DIAGNOSIS — Z803 Family history of malignant neoplasm of breast: Secondary | ICD-10-CM | POA: Insufficient documentation

## 2022-02-17 DIAGNOSIS — C50919 Malignant neoplasm of unspecified site of unspecified female breast: Secondary | ICD-10-CM

## 2022-02-17 DIAGNOSIS — R739 Hyperglycemia, unspecified: Secondary | ICD-10-CM

## 2022-02-17 DIAGNOSIS — Z853 Personal history of malignant neoplasm of breast: Secondary | ICD-10-CM

## 2022-02-17 DIAGNOSIS — I639 Cerebral infarction, unspecified: Secondary | ICD-10-CM | POA: Diagnosis not present

## 2022-02-17 DIAGNOSIS — C3490 Malignant neoplasm of unspecified part of unspecified bronchus or lung: Secondary | ICD-10-CM

## 2022-02-17 DIAGNOSIS — Z87820 Personal history of traumatic brain injury: Secondary | ICD-10-CM | POA: Insufficient documentation

## 2022-02-17 DIAGNOSIS — Z5111 Encounter for antineoplastic chemotherapy: Secondary | ICD-10-CM | POA: Insufficient documentation

## 2022-02-17 DIAGNOSIS — Z7982 Long term (current) use of aspirin: Secondary | ICD-10-CM | POA: Diagnosis not present

## 2022-02-17 DIAGNOSIS — Z79899 Other long term (current) drug therapy: Secondary | ICD-10-CM | POA: Insufficient documentation

## 2022-02-17 DIAGNOSIS — Z95828 Presence of other vascular implants and grafts: Secondary | ICD-10-CM

## 2022-02-17 LAB — CMP (CANCER CENTER ONLY)
ALT: 12 U/L (ref 0–44)
AST: 12 U/L — ABNORMAL LOW (ref 15–41)
Albumin: 3.6 g/dL (ref 3.5–5.0)
Alkaline Phosphatase: 86 U/L (ref 38–126)
Anion gap: 5 (ref 5–15)
BUN: 11 mg/dL (ref 8–23)
CO2: 28 mmol/L (ref 22–32)
Calcium: 9.6 mg/dL (ref 8.9–10.3)
Chloride: 109 mmol/L (ref 98–111)
Creatinine: 0.48 mg/dL (ref 0.44–1.00)
GFR, Estimated: >60 mL/min (ref >60)
Glucose, Bld: 97 mg/dL (ref 70–99)
Potassium: 3.5 mmol/L (ref 3.5–5.1)
Sodium: 142 mmol/L (ref 135–145)
Total Bilirubin: 0.4 mg/dL (ref 0.3–1.2)
Total Protein: 6.8 g/dL (ref 6.5–8.1)

## 2022-02-17 LAB — GENETIC SCREENING ORDER

## 2022-02-17 LAB — CBC WITH DIFFERENTIAL (CANCER CENTER ONLY)
Abs Immature Granulocytes: 0.01 10*3/uL (ref 0.00–0.07)
Basophils Absolute: 0 10*3/uL (ref 0.0–0.1)
Basophils Relative: 1 %
Eosinophils Absolute: 0.1 10*3/uL (ref 0.0–0.5)
Eosinophils Relative: 1 %
HCT: 38.9 % (ref 36.0–46.0)
Hemoglobin: 12.9 g/dL (ref 12.0–15.0)
Immature Granulocytes: 0 %
Lymphocytes Relative: 12 %
Lymphs Abs: 0.6 10*3/uL — ABNORMAL LOW (ref 0.7–4.0)
MCH: 29.9 pg (ref 26.0–34.0)
MCHC: 33.2 g/dL (ref 30.0–36.0)
MCV: 90 fL (ref 80.0–100.0)
Monocytes Absolute: 0.7 10*3/uL (ref 0.1–1.0)
Monocytes Relative: 14 %
Neutro Abs: 3.5 10*3/uL (ref 1.7–7.7)
Neutrophils Relative %: 72 %
Platelet Count: 403 10*3/uL — ABNORMAL HIGH (ref 150–400)
RBC: 4.32 MIL/uL (ref 3.87–5.11)
RDW: 14.6 % (ref 11.5–15.5)
WBC Count: 4.9 10*3/uL (ref 4.0–10.5)
nRBC: 0 % (ref 0.0–0.2)

## 2022-02-17 MED ORDER — SODIUM CHLORIDE 0.9 % IV SOLN: Freq: Once | INTRAVENOUS | Status: AC

## 2022-02-17 MED ORDER — SODIUM CHLORIDE 0.9% FLUSH
10.0000 mL | Freq: Once | INTRAVENOUS | Status: AC
  Administered 2022-02-17: 10 mL

## 2022-02-17 MED ORDER — SODIUM CHLORIDE 0.9 % IV SOLN
200.0000 mg | Freq: Once | INTRAVENOUS | Status: AC
  Administered 2022-02-17: 200 mg via INTRAVENOUS
  Filled 2022-02-17: qty 8

## 2022-02-17 MED ORDER — SODIUM CHLORIDE 0.9% FLUSH
10.0000 mL | INTRAVENOUS | Status: DC | PRN
  Administered 2022-02-17: 10 mL

## 2022-02-17 MED ORDER — HEPARIN SOD (PORK) LOCK FLUSH 100 UNIT/ML IV SOLN
500.0000 [IU] | Freq: Once | INTRAVENOUS | Status: AC | PRN
  Administered 2022-02-17: 500 [IU]

## 2022-02-17 NOTE — Patient Instructions (Signed)
[facility]  Discharge Instructions: Thank you for choosing [facility] to provide your oncology and hematology care.   If you have a lab appointment with the [facility], please go directly to the [facility] and check in at the registration area.   Wear comfortable clothing and clothing appropriate for easy access to any Portacath or PICC line.   We strive to give you quality time with your provider. You may need to reschedule your appointment if you arrive late (15 or more minutes).  Arriving late affects you and other patients whose appointments are after yours.  Also, if you miss three or more appointments without notifying the office, you may be dismissed from the clinic at the provider's discretion.      For prescription refill requests, have your pharmacy contact our office and allow 72 hours for refills to be completed.    Today you received the following chemotherapy and/or immunotherapy agents: Keytruda.       To help prevent nausea and vomiting after your treatment, we encourage you to take your nausea medication as directed.  BELOW ARE SYMPTOMS THAT SHOULD BE REPORTED IMMEDIATELY: *FEVER GREATER THAN 100.4 F (38 C) OR HIGHER *CHILLS OR SWEATING *NAUSEA AND VOMITING THAT IS NOT CONTROLLED WITH YOUR NAUSEA MEDICATION *UNUSUAL SHORTNESS OF BREATH *UNUSUAL BRUISING OR BLEEDING *URINARY PROBLEMS (pain or burning when urinating, or frequent urination) *BOWEL PROBLEMS (unusual diarrhea, constipation, pain near the anus) TENDERNESS IN MOUTH AND THROAT WITH OR WITHOUT PRESENCE OF ULCERS (sore throat, sores in mouth, or a toothache) UNUSUAL RASH, SWELLING OR PAIN  UNUSUAL VAGINAL DISCHARGE OR ITCHING   Items with * indicate a potential emergency and should be followed up as soon as possible or go to the Emergency Department if any problems should occur.  Please show the CHEMOTHERAPY ALERT CARD or IMMUNOTHERAPY ALERT CARD at  check-in to the Emergency Department and triage nurse.  Should you have questions after your visit or need to cancel or reschedule your appointment, please contact [facility]  Dept: [phone]  and follow the prompts.  Office hours are 8:00 a.m. to 4:30 p.m. Monday - Friday. Please note that voicemails left after 4:00 p.m. may not be returned until the following business day.  We are closed weekends and major holidays. You have access to a nurse at all times for urgent questions. Please call the main number to the clinic Dept: [phone] and follow the prompts.   For any non-urgent questions, you may also contact your provider using MyChart. We now offer e-Visits for anyone 67 and older to request care online for non-urgent symptoms. For details visit [email or web address].   Also download the MyChart app! Go to the app store, search "MyChart", open the app, select [facility], and log in with your MyChart username and password.

## 2022-02-17 NOTE — Assessment & Plan Note (Addendum)
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  She also appears to have had some regional adenopathy including prevascular, AP window and left hilar adenopathy.  She has multiple intracranial metastatic lesions which are likely secondary to metastatic lung cancer given presence of regional adenopathy.  She is now status post SRS to the brain lesions. She started concurrent chemoradiation with CarboTaxol on August 30, 2021.  She completed chemoradiation on October 15, 2021.  She is now on single agent Keytruda every 3 weeks.   End of treatment PET/CT reviewed consistent with response in the lung, likely postradiation changes noted. PET also commented on decrease in size of the breast mass. She is here before her cycle of immunotherapy. During her last treatment, I recommended CT chest on an urgent basis because of some supraclavicular fullness on the left side and also concern for increasing size of the breast mass.  This is not done.  She most recently had MRI brain and MRA which was without definitive progression however there were some areas of concern and she will have a repeat imaging in 3 months according to Dr. Mickeal Skinner. Will try to get the CT imaging scheduled as soon as possible.  I also ordered a mammogram and ultrasound.  I believe the right breast mass is progressively increasing and she also appears to have a nipple discharge today.  I will try to discuss with breast surgery if there is any role for surgery. Okay to proceed with immunotherapy as planned today.  Thank you for consulting [name] in the care of this patient.  Please do not hesitate to contact us with any additional questions or concerns.

## 2022-02-17 NOTE — Progress Notes (Signed)
[facility] [place], [place] [address] [phone]   Interval Evaluation  Date of Service: 02/17/22 Patient Name: Jennifer Cowan Patient MRN: [id number] Patient DOB: 12-02-1945 Provider: Ventura Sellers, MD  Identifying Statement:  [name] is a 77 y.o. female with Metastasis to brain [facility])  Cerebrovascular accident (CVA), unspecified mechanism ([facility])   Primary Cancer:  Oncologic History: Oncology History  Malignant neoplasm of unspecified part of unspecified bronchus or lung ([place])  08/13/2021 Initial Diagnosis   Malignant neoplasm of unspecified part of unspecified bronchus or lung ([place])   08/30/2021 - 09/13/2021 Chemotherapy   Patient is on Treatment Plan : LUNG Carboplatin / Paclitaxel + XRT q7d     08/30/2021 - 10/11/2021 Chemotherapy   Patient is on Treatment Plan : LUNG Carboplatin + Paclitaxel + XRT q7d     11/16/2021 -  Chemotherapy   Patient is on Treatment Plan : LUNG Pembrolizumab Q 21D      CNS Oncologic History 08/20/21: Completes SRS to 6 metastases, including frx to large R temporal and L frontal lesions Jennifer Cowan)  Interval History: Jennifer Cowan presents today for evaluation following recent MRI, MRA.  She continues to deny focal neurolgic complaints.  No new or progressive changes. No weakness, numbness, speech difficulty.  Family does help her with cooking and "running the bath" because of memory, cognitive issues.  She walks on her own but uses a cane or walker for longer trips.  Continues on [place] with Dr. Chryl Heck.  No headaches or seizures.  Medications: Current Outpatient Medications on File Prior to Visit  Medication Sig Dispense Refill   Accu-Chek Softclix Lancets lancets Use to check blood sugar as directed 4 times a day 100 each 1   acetaminophen (TYLENOL) 500 MG tablet Take 1,000 mg by mouth every 6 (six) hours as needed for moderate pain.     amLODipine (NORVASC) 10 MG tablet TAKE 1 TABLET  BY MOUTH DAILY 90 tablet 3   aspirin EC 325 MG tablet Take 1 tablet (325 mg total) by mouth daily. 60 tablet 2   atorvastatin (LIPITOR) 80 MG tablet Take 1 tablet (80 mg total) by mouth daily. 90 tablet 3   benazepril (LOTENSIN) 10 MG tablet TAKE 1 TABLET BY MOUTH DAILY 90 tablet 3   blood glucose meter kit and supplies KIT Dispense based on patient and insurance preference. Use up to four times daily as directed. 1 each 0   Blood Glucose Monitoring Suppl (ACCU-CHEK GUIDE) w/Device KIT Use as directed 4 times a day 1 kit 0   Cholecalciferol (VITAMIN D3) 125 MCG (5000 UT) CAPS Take 5,000 Units by mouth daily.     dexamethasone (DECADRON) 4 MG tablet Take 1 tablet (4 mg total) by mouth 3 (three) times daily. 90 tablet 1   ezetimibe (ZETIA) 10 MG tablet Take 1 tablet (10 mg total) by mouth daily. 90 tablet 3   glucose 4 GM chewable tablet Chew 1 tablet (4 g total) by mouth as needed for low blood sugar. 50 tablet 0   glucose blood (ACCU-CHEK GUIDE) test strip Use as directed 4 times a day 100 each 1   insulin glargine (LANTUS SOLOSTAR) 100 UNIT/ML Solostar Pen Inject 8 Units into the skin at bedtime. 15 mL 1   Insulin Pen Needle 31G X 5 MM MISC 1 each by Does not apply route daily. 90 each 1   insulin regular (NOVOLIN R) 100 units/mL injection If blood  sugar is 200 or less: 0 units 201-250: 2 units 251-300: 4 units 301-350: 6 units 351-400: 8 units 401 or higher: 10 units and call MD 10 mL 1   Insulin Syringe-Needle U-100 (INSULIN SYRINGE .3CC/29GX1") 29G X 1" 0.3 ML MISC 1 Dose by Does not apply route 3 (three) times daily before meals. 90 each 1   lidocaine (LIDODERM) 5 % Place 1 patch onto the skin daily. Remove & Discard patch within 12 hours or as directed by MD 30 patch 1   LORazepam (ATIVAN) 0.5 MG tablet 1 tab po 30 minutes prior to radiation or MRI scans 30 tablet 0   meloxicam (MOBIC) 7.5 MG tablet TAKE 1 TABLET BY MOUTH DAILY 90 tablet 3   potassium chloride (MICRO-K) 10 MEQ CR capsule Take  2 capsules (20 mEq total) by mouth daily. 60 capsule 1   prochlorperazine (COMPAZINE) 10 MG tablet Take 1 tablet (10 mg total) by mouth every 6 (six) hours as needed for nausea or vomiting. 30 tablet 1   sucralfate (CARAFATE) 1 g tablet Take 1 tablet (1 g total) by mouth 4 (four) times daily -  with meals and at bedtime. Crush 1 tablet in 1 oz water and drink 5 min before meals for radiation induced esophagitis 120 tablet 2   trolamine salicylate (ASPERCREME) 10 % cream Apply 1 Application topically daily as needed for muscle pain.     valACYclovir (VALTREX) 500 MG tablet Take 1 tablet (500 mg total) by mouth 2 (two) times daily. 20 tablet 0   Current Facility-Administered Medications on File Prior to Visit  Medication Dose Route Frequency Provider Last Rate Last Admin   acetaminophen (TYLENOL) 325 MG tablet            cyanocobalamin (VITAMIN B12) 1000 MCG/ML injection            diphenhydrAMINE (BENADRYL) 25 mg capsule            gadopiclenol (VUEWAY) 0.5 MMOL/ML solution 7.5 mL  7.5 mL Intravenous Once PRN Tyler Pita, MD       heparin lock flush 100 unit/mL  500 Units Intravenous Once Kyung Rudd, MD       sodium chloride flush (NS) 0.9 % injection 10 mL  10 mL Intravenous Once Kyung Rudd, MD        Allergies: No Known Allergies Past Medical History:  Past Medical History:  Diagnosis Date   Anemia    Arthritis    Breast cancer ([place])    Cancer ([place]) 1999   Breast Cancer   Hyperlipidemia    Hypertension    Osteopenia 01/12/2021   Past Surgical History:  Past Surgical History:  Procedure Laterality Date   BREAST SURGERY  1999   Masectomy- left   BRONCHIAL BIOPSY  07/30/2021   Procedure: BRONCHIAL BIOPSIES;  Surgeon: Collene Gobble, MD;  Location: [facility] ENDOSCOPY;  Service: Pulmonary;;   BRONCHIAL BRUSHINGS  07/30/2021   Procedure: BRONCHIAL BRUSHINGS;  Surgeon: Collene Gobble, MD;  Location: [facility] END[facility]COPY;  Service: Pulmonary;;   BRONCHIAL NEEDLE ASPIRATION BIOPSY  07/30/2021    Procedure: BRONCHIAL NEEDLE ASPIRATION BIOPSIES;  Surgeon: Collene Gobble, MD;  Location: [facility] ENDOSCOPY;  Service: Pulmonary;;   CYSTECTOMY     cyst removed off top of head   IR IMAGING GUIDED PORT INSERTION  08/20/2021   MASTECTOMY     VIDEO BRONCHOSCOPY WITH RADIAL ENDOBRONCHIAL ULTRASOUND  07/30/2021   Procedure: VIDEO BRONCHOSCOPY WITH RADIAL ENDOBRONCHIAL ULTRASOUND;  Surgeon: Collene Gobble, MD;  Location: [facility] ENDOSCOPY;  Service: Pulmonary;;   Social History:  Social History   Socioeconomic History   Marital status: Single    Spouse name: Not on file   Number of children: 0   Years of education: 12   Highest education level: High school graduate  Occupational History   Occupation: retired  Tobacco Use   Smoking status: Never   Smokeless tobacco: Never  Scientific laboratory technician Use: Never used  Substance and Sexual Activity   Alcohol use: No   Drug use: Never   Sexual activity: Yes    Birth control/protection: Post-menopausal  Other Topics Concern   Not on file  Social History Narrative   Not on file   Social Determinants of Health   Financial Resource Strain: Not on file  Food Insecurity: Not on file  Transportation Needs: Not on file  Physical Activity: Not on file  Stress: Not on file  Social Connections: Not on file  Intimate Partner Violence: Not on file   Family History:  Family History  Problem Relation Age of Onset   Arthritis Mother    Breast cancer Sister    Diabetes Sister    Hyperlipidemia Sister    Hypertension Sister    Stroke Sister    Diabetes Brother    Hypertension Brother     Review of Systems: Constitutional: Doesn't report fevers, chills or abnormal weight loss Eyes: Doesn't report blurriness of vision Ears, nose, mouth, throat, and face: Doesn't report sore throat Respiratory: Doesn't report cough, dyspnea or wheezes Cardiovascular: Doesn't report palpitation, chest discomfort  Gastrointestinal:  Doesn't report nausea,  constipation, diarrhea GU: Doesn't report incontinence Skin: Doesn't report skin rashes Neurological: Per HPI Musculoskeletal: Doesn't report joint pain Behavioral/Psych: Doesn't report anxiety  Physical Exam: There were no vitals filed for this visit.  KPS: 70. General: Alert, cooperative, pleasant, in no acute distress Head: Normal EENT: No conjunctival injection or scleral icterus.  Lungs: Resp effort normal Cardiac: Regular rate Abdomen: Non-distended abdomen Skin: No rashes cyanosis or petechiae. Extremities: No clubbing or edema  Neurologic Exam: Mental Status: Awake, alert, attentive to examiner. Oriented to self and environment. Language is fluent with intact comprehension.  Pyschomotor slowing, impaired recall Cranial Nerves: Visual acuity is grossly normal. Visual fields are full. Extra-ocular movements intact. No ptosis. Face is symmetric Motor: Tone and bulk are normal. Power is full in both arms and legs. Reflexes are symmetric, no pathologic reflexes present.  Sensory: Intact to light touch Gait: deferred   Labs: I have reviewed the data as listed    Component Value Date/Time   NA 140 01/26/2022 1330   NA 141 07/07/2021 1538   K 3.7 01/26/2022 1330   CL 108 01/26/2022 1330   CO2 28 01/26/2022 1330   GLUCOSE 80 01/26/2022 1330   BUN 12 01/26/2022 1330   BUN 9 07/07/2021 1538   CREATININE 0.50 01/26/2022 1330   CALCIUM 9.7 01/26/2022 1330   PROT 6.7 01/26/2022 1330   PROT 7.9 07/07/2021 1538   ALBUMIN 3.7 01/26/2022 1330   ALBUMIN 4.6 07/07/2021 1538   AST 11 (L) 01/26/2022 1330   ALT 10 01/26/2022 1330   ALKPHOS 76 01/26/2022 1330   BILITOT 0.3 01/26/2022 1330   GFRNONAA >60 01/26/2022 1330   GFRAA 103 03/05/2020 1119   Lab Results  Component Value Date   WBC 6.0 01/26/2022   NEUTROABS 4.6 01/26/2022   HGB 12.8 01/26/2022   HCT 39.1 01/26/2022   MCV 90.9 01/26/2022  PLT 419 (H) 01/26/2022    Imaging: CLINICAL DATA:  Stroke, follow up;  Brain/CNS neoplasm, assess treatment response   EXAM: MRI HEAD WITHOUT CONTRAST   MRA HEAD WITHOUT CONTRAST   MRA NECK WITHOUT AND WITH CONTRAST   TECHNIQUE: Multiplanar, multi-echo pulse sequences of the brain and surrounding structures were acquired without intravenous contrast. Angiographic images of the Circle of Willis were acquired using MRA technique without intravenous contrast. Angiographic images of the neck were acquired using MRA technique without and with intravenous contrast. Carotid stenosis measurements (when applicable) are obtained utilizing NASCET criteria, using the distal internal carotid diameter as the denominator.   CONTRAST:  7 mL of Vueway   COMPARISON:  MRI December 01, 2021.   FINDINGS: MRI HEAD FINDINGS   Brain:   Motion limited postcontrast imaging.  Within this limitation:   Stable or smaller lesions:   1. Mildly decreased size of 2.3 x 2.6 x 1.9 cm peripherally enhancing lesion in the right temporal lobe (series 13, image 59). Evolving subacute blood products with similar surrounding edema. 2. Decreased size of a 9 mm lesion in the high left paramidline frontal lobe (series 13, image 122. 3. Subcentimeter vessel versus lesion in the left occipital lobe appears similar versus slightly decreased and is poorly visualized on series 15, image 23. 4. Possible punctate focus of enhancement in the left perirolandic region may persist (series 13, image 110) although motion limits assessment.   Punctate lesion in the left temporal lobe described on the prior is no longer visible. The lesions in the right frontal lobe scribed on prior are not definitely visualized. No definite new lesions identified, although motion does limit assessment.   Similar enlargement of the pituitary gland, measuring approximately 1 cm on series 15, image 23.   No evidence of acute hemorrhage, acute infarct, midline shift or hydrocephalus. Evidence of evolving  infarcts the frontal lobe bilaterally.   Vascular: See below.   Skull and upper cervical spine: Normal marrow signal.   Sinuses/Orbits: Clear sinuses.  No acute orbital findings.   Other: No mastoid effusions.   MRA HEAD FINDINGS   Motion limited study.   Anterior circulation: Bilateral intracranial ICAs, MCAs, and ACAs are patent without proximal high-grade stenosis.   Posterior circulation: Bilateral intradural vertebral arteries, basilar artery and bilateral posterior cerebral arteries are patent without proximal high-grade stenosis   MRA NECK FINDINGS   Aortic arch: Great vessel origins are patent.   Right carotid system: Patent without significant stenosis.   Left carotid system: Patent without significant stenosis.   Vertebral arteries: Bilateral vertebral arteries patent without high-grade stenosis   Other: None   IMPRESSION: MRI head:   1. Possible punctate focus of enhancement in the left perirolandic region may persist although significant motion limits assessment and this certainly could be artifactual. Given possible persistence, a small metastasis is difficult to exclude in this region. If it will change management then repeat postcontrast imaging is recommended to further evaluate this lesion and to also better assess for new lesions. 2. Other lesions are similar or decreased in size, described above. 3. No definite new lesions identified; however, significant motion limits assessment. 4. Similar suspected evolving subacute infarcts in the frontal lobes bilaterally. 5. 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 Recommend continued attention on follow-up.   MRA:   No large vessel occlusion or proximal high-grade stenosis.     Electronically Signed   By: Margaretha Sheffield M.D.   On: 01/16/2022 16:39  [facility]  Clinician Interpretation: I have personally reviewed the radiological images as listed.  My interpretation, in the context  of the patient's clinical presentation, is stable disease   Assessment/Plan Metastasis to brain [facility])  Cerebrovascular accident (CVA), unspecified mechanism ([place])  AQSA [place] is clinically and radiographically stable today.  Two foci of enhancement and DWI signal on prior scan are not clearly redemonstrated.  Echocardiogram and MRA head/neck were reviewed, are normal.  We recommended continuing Aspirin 325mg  daily for stroke prevention, she will also continue high dose statin.    MRA can be added on to MRI brain study which will be ordered for the end of December.  We will follow up with her after this testing in early January 2024.    We spent twenty additional minutes teaching regarding the natural history, biology, and historical experience in the treatment of neurologic complications of cancer.   We appreciate the opportunity to participate in the care of LEANNY MOECKEL.   All questions were answered. The patient knows to call the clinic with any problems, questions or concerns. No barriers to learning were detected.  The total time spent in the encounter was 30 minutes and more than 50% was on counseling and review of test results   Ventura Sellers, MD Medical Director of Neuro-Oncology [facility] 02/17/22 10:39 AM

## 2022-02-17 NOTE — Progress Notes (Signed)
REFERRING PROVIDER: [name], FNP [place],  [place] [id number]  PRIMARY PROVIDER:  [name], FNP  PRIMARY REASON FOR VISIT:  1. Family history of breast cancer   2. Malignant neoplasm of unspecified part of unspecified bronchus or lung ([place])   3. History of breast cancer      HISTORY OF PRESENT ILLNESS:   Jennifer Cowan, a 77 y.o. female, was seen for a [facility] cancer genetics consultation at the request of Dr. Lilia Pro due to a personal and family history of breast cancer.  Jennifer Cowan presents to clinic today to discuss the possibility of a hereditary predisposition to cancer, genetic testing, and to further clarify her future cancer risks, as well as potential cancer risks for family members.   In 2000, at the age of 67, Jennifer Cowan was diagnosed with breast cancer.  Jennifer Cowan was diagnosed with a second breast cancer at age 89.       CANCER HISTORY:  Oncology History  Malignant neoplasm of unspecified part of unspecified bronchus or lung ([place])  08/13/2021 Initial Diagnosis   Malignant neoplasm of unspecified part of unspecified bronchus or lung ([place])   08/30/2021 - 09/13/2021 Chemotherapy   Patient is on Treatment Plan : LUNG Carboplatin / Paclitaxel + XRT q7d     08/30/2021 - 10/11/2021 Chemotherapy   Patient is on Treatment Plan : LUNG Carboplatin + Paclitaxel + XRT q7d     11/16/2021 -  Chemotherapy   Patient is on Treatment Plan : LUNG Pembrolizumab Q 21D       Past Medical History:  Diagnosis Date   Anemia    Arthritis    Breast cancer ([place])    Cancer ([place]) 1999   Breast Cancer   Family history of breast cancer    Hyperlipidemia    Hypertension    Osteopenia 01/12/2021   Personal history of malignant neoplasm of breast     Past Surgical History:  Procedure Laterality Date   [place]- left   BRONCHIAL BIOPSY  07/30/2021   Procedure: BRONCHIAL BIOPSIES;  Surgeon: Collene Gobble, MD;  Location: [facility];   Service: Pulmonary;;   BRONCHIAL BRUSHINGS  07/30/2021   Procedure: BRONCHIAL BRUSHINGS;  Surgeon: Collene Gobble, MD;  Location: [facility];  Service: Pulmonary;;   BRONCHIAL NEEDLE ASPIRATION BIOPSY  07/30/2021   Procedure: BRONCHIAL NEEDLE ASPIRATION BIOPSIES;  Surgeon: Collene Gobble, MD;  [facility];  Service: Pulmonary;;   CYSTECTOMY     cyst removed off top of head   IR IMAGING GUIDED PORT INSERTION  08/20/2021   MASTECTOMY     VIDEO BRONCHOSCOPY WITH RADIAL ENDOBRONCHIAL ULTRASOUND  07/30/2021   Procedure: VIDEO BRONCHOSCOPY WITH RADIAL ENDOBRONCHIAL ULTRASOUND;  Surgeon: Collene Gobble, MD;  Location[facility];  Service: Pulmonary;;    Social History   Socioeconomic History   Marital status: Single    Spouse name: Not on file   Number of children: 0   Years of education: 12   Highest education level: High school graduate  Occupational History   Occupation: retired  Tobacco Use   Smoking status: Never   Smokeless tobacco: Never  Vaping Use   Vaping Use: Never used  Substance and Sexual Activity   Alcohol use: No   Drug use: Never   Sexual activity: Yes    Birth control/protection: Post-menopausal  Other Topics Concern   Not on file  Social History Narrative   Not  on file   Social Determinants of Health   Financial Resource Strain: Not on file  Food Insecurity: Not on file  Transportation Needs: Not on file  Physical Activity: Not on file  Stress: Not on file  Social Connections: Not on file     FAMILY HISTORY:  We obtained a detailed, 4-generation family history.  Significant diagnoses are listed below: Family History  Problem Relation Age of Onset   Arthritis Mother    Breast cancer Sister 55   Diabetes Sister    Hyperlipidemia Sister    Hypertension Sister    Stroke Sister    Dementia Sister    Diabetes Brother    Hypertension Brother    Breast cancer Niece 20      The patient does not have children.  She has two brothers and  six sisters.  One brother has a daughter who had breast cancer at 73.  One sister developed breast cancer at 16.  Both parents are deceased.  The patient's mother died at 48 with dementia.  She was an only child.  Her parents died of natural causes.  The patient's father died at 75.  He had several siblings, one had lung cancer.  There is no other reported family history of cancer.  Jennifer Cowan is unaware of previous family history of genetic testing for hereditary cancer risks. There is no reported Ashkenazi Jewish ancestry. There is no known consanguinity.  GENETIC COUNSELING ASSESSMENT: Jennifer Cowan is a 77 y.o. female with a personal and family history of breast cancer which is somewhat suggestive of a hereditary cancer syndrome and predisposition to cancer given the number of women in the family with breast cancer. We, therefore, discussed and recommended the following at today's visit.   DISCUSSION: We discussed that, in general, most cancer is not inherited in families, but instead is sporadic or familial. Sporadic cancers occur by chance and typically happen at older ages (>50 years) as this type of cancer is caused by genetic changes acquired during an individual's lifetime. Some families have more cancers than would be expected by chance; however, the ages or types of cancer are not consistent with a known genetic mutation or known genetic mutations have been ruled out. This type of familial cancer is thought to be due to a combination of multiple genetic, environmental, hormonal, and lifestyle factors. While this combination of factors likely increases the risk of cancer, the exact source of this risk is not currently identifiable or testable.  We discussed that 5 - 10% of breast cancer is hereditary, with most cases associated with BRCA mutations.  There are other genes that can be associated with hereditary breast cancer syndromes.  These include ATM, CHEK2 and PALB2.  We discussed that testing  is beneficial for several reasons including knowing how to follow individuals after completing their treatment, identifying whether potential treatment options such as PARP inhibitors would be beneficial, and understand if other family members could be at risk for cancer and allow them to undergo genetic testing.   We reviewed the characteristics, features and inheritance patterns of hereditary cancer syndromes. We also discussed genetic testing, including the appropriate family members to test, the process of testing, insurance coverage and turn-around-time for results. We discussed the implications of a negative, positive, carrier and/or variant of uncertain significant result. Jennifer Cowan  was offered a common hereditary cancer panel (47 genes) and an expanded pan-cancer panel (77 genes). Jennifer Cowan was informed of the benefits and limitations of each  panel, including that expanded pan-cancer panels contain genes that do not have clear management guidelines at this point in time.  We also discussed that as the number of genes included on a panel increases, the chances of variants of uncertain significance increases. Jennifer Cowan decided to pursue genetic testing for the CancerNext-Expanded+RNAinsight gene panel.   Based on Jennifer Cowan's personal and family history of cancer, she meets medical criteria for genetic testing. Despite that she meets criteria, she may still have an out of pocket cost. We discussed that if her out of pocket cost for testing is over $100, the laboratory will call and confirm whether she wants to proceed with testing.  If the out of pocket cost of testing is less than $100 she will be billed by the genetic testing laboratory.   We discussed that some people do not want to undergo genetic testing due to fear of genetic discrimination.  The Genetic Information Nondiscrimination Act (GINA) was signed into federal law in 2008. GINA prohibits health insurers and most employers from  discriminating against individuals based on genetic information (including the results of genetic tests and family history information). According to GINA, health insurance companies cannot consider genetic information to be a preexisting condition, nor can they use it to make decisions regarding coverage or rates. GINA also makes it illegal for most employers to use genetic information in making decisions about hiring, firing, promotion, or terms of employment. It is important to note that GINA does not offer protections for life insurance, disability insurance, or long-term care insurance. GINA does not apply to those in the [employer], those who work for companies with less than 15 employees, and new life insurance or long-term disability insurance policies.  Health status due to a cancer diagnosis is not protected under GINA. More information about GINA can be found by visiting [email or web address].   PLAN: After considering the risks, benefits, and limitations, Jennifer Cowan provided informed consent to pursue genetic testing and the blood sample was sent to [facility] for analysis of the CancerNext-Expanded+RNAinsight. Results should be available within approximately 2-3 weeks' time, at which point they will be disclosed by telephone to Jennifer Cowan, as will any additional recommendations warranted by these results. Jennifer Cowan will receive a summary of her genetic counseling visit and a copy of her results once available. This information will also be available in Epic.   Lastly, we encouraged Jennifer Cowan to remain in contact with cancer genetics annually so that we can continuously update the family history and inform her of any changes in cancer genetics and testing that may be of benefit for this family.   Jennifer Cowan questions were answered to her satisfaction today. Our contact information was provided should additional questions or concerns arise. Thank you for the referral and allowing [name] to  share in the care of your patient.   Marchelle Rinella P. Florene Glen, [place], [facility] Licensed, Insurance risk surveyor [email or web address] phone: [phone]  The patient was seen for a total of 35 minutes in face-to-face genetic counseling.  The patient brought her sister and niece. Drs. Michell Heinrich, and/or [place] were available for questions, if needed..    _______________________________________________________________________ For Office Staff:  Number of people involved in session: 3 Was an Intern/ student involved with case: no

## 2022-02-17 NOTE — Progress Notes (Signed)
[facility] Follow up:    Jennifer Cowan, [place] [id number]   DIAGNOSIS:  Cancer Staging  No matching staging information was found for the patient.  SUMMARY OF ONCOLOGIC HISTORY: Oncology History  Malignant neoplasm of unspecified part of unspecified bronchus or lung ([place])  08/13/2021 Initial Diagnosis   Malignant neoplasm of unspecified part of unspecified bronchus or lung ([place])   08/30/2021 - 09/13/2021 Chemotherapy   Patient is on Treatment Plan : LUNG Carboplatin / Paclitaxel + XRT q7d     08/30/2021 - 10/11/2021 Chemotherapy   Patient is on Treatment Plan : LUNG Carboplatin + Paclitaxel + XRT q7d     11/16/2021 -  Chemotherapy   Patient is on Treatment Plan : LUNG Pembrolizumab Q 21D       CURRENT THERAPY: Pembrolizumab  INTERVAL HISTORY:  Jennifer Cowan 77 y.o. female returns for follow-up of her breast and lung cancer.   She is on maintenance immunotherapy.  Last imaging 01/03/2022 showed response in LUL mass, AP window and prevascular LN. She denies any new complaints except for pain in the area of the shingles rash. She also had MR brain, saw Dr Mickeal Skinner, recommendation is to repeat MRI of the brain in 3 months. She continues to have pain in the back, in the area of previous shingles. She tells me that the breast mass is about the same to her.  No other adverse effects from immunotherapy. Rest of the pertinent 10 point ROS reviewed and neg.  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  MEDICAL HISTORY: Past Medical History:  Diagnosis Date   Anemia    Arthritis    Breast cancer ([place])    Cancer ([place]) 1999   Breast Cancer   Family history of breast cancer    Hyperlipidemia    Hypertension    Osteopenia 01/12/2021   Personal history of malignant neoplasm of breast     SURGICAL HISTORY: Past Surgical History:  Procedure Laterality Date   BREAST SURGERY  1999   Masectomy- left   BRONCHIAL BIOPSY  07/30/2021   Procedure: BRONCHIAL BIOPSIES;  Surgeon: Collene Gobble, MD;  Location: [facility];  Service: Pulmonary;;   BRONCHIAL BRUSHINGS  07/30/2021   Procedure: BRONCHIAL BRUSHINGS;  Surgeon: Collene Gobble, MD;  Location: [facility];  Service: Pulmonary;;   BRONCHIAL NEEDLE ASPIRATION BIOPSY  07/30/2021   Procedure: BRONCHIAL NEEDLE ASPIRATION BIOPSIES;  Surgeon: Collene Gobble, MD;  Location: [facility];  Service: Pulmonary;;   CYSTECTOMY     cyst removed off top of head   IR IMAGING GUIDED PORT INSERTION  08/20/2021   MASTECTOMY  VIDEO BRONCHOSCOPY WITH RADIAL ENDOBRONCHIAL ULTRASOUND  07/30/2021   Procedure: VIDEO BRONCHOSCOPY WITH RADIAL ENDOBRONCHIAL ULTRASOUND;  Surgeon: Collene Gobble, MD;  Location[facility];  Service: Pulmonary;;    SOCIAL HISTORY: Social History   Socioeconomic History   Marital status: Single    Spouse name: Not on file   Number of children: 0   Years of education: 12   Highest education level: High school graduate  Occupational History   Occupation: retired  Tobacco Use   Smoking status: Never    Smokeless tobacco: Never  Vaping Use   Vaping Use: Never used  Substance and Sexual Activity   Alcohol use: No   Drug use: Never   Sexual activity: Yes    Birth control/protection: Post-menopausal  Other Topics Concern   Not on file  Social History Narrative   Not on file   Social Determinants of Health   Financial Resource Strain: Not on file  Food Insecurity: Not on file  Transportation Needs: Not on file  Physical Activity: Not on file  Stress: Not on file  Social Connections: Not on file  Intimate Partner Violence: Not on file    FAMILY HISTORY: Family History  Problem Relation Age of Onset   Arthritis Mother    Breast cancer Sister 10   Diabetes Sister    Hyperlipidemia Sister    Hypertension Sister    Stroke Sister    Dementia Sister    Diabetes Brother    Hypertension Brother    Breast cancer Niece 45    Review of Systems  Constitutional:  Positive for fatigue. Negative for appetite change, chills, fever and unexpected weight change.  HENT:   Negative for hearing loss, lump/mass and trouble swallowing.   Eyes:  Negative for eye problems and icterus.  Respiratory:  Negative for chest tightness, cough and shortness of breath.   Cardiovascular:  Negative for chest pain, leg swelling and palpitations.  Gastrointestinal:  Negative for abdominal distention, abdominal pain, constipation, diarrhea, nausea and vomiting.  Endocrine: Negative for hot flashes.  Genitourinary:  Negative for difficulty urinating.   Musculoskeletal:  Negative for arthralgias.  Skin:  Negative for itching and rash.  Neurological:  Negative for dizziness, extremity weakness, headaches and numbness.  Hematological:  Negative for adenopathy. Does not bruise/bleed easily.  Psychiatric/Behavioral:  Negative for depression. The patient is not nervous/anxious.       PHYSICAL EXAMINATION  ECOG PERFORMANCE STATUS: 3 - Symptomatic, >50% confined to bed  Vitals:   02/17/22 1306  BP: 126/65   Pulse: 99  Resp: 16  Temp: 97.8 F (36.6 C)  SpO2: 99%     Physical Exam Constitutional:      General: She is not in acute distress.    Appearance: Normal appearance. She is not toxic-appearing.  HENT:     Head: Normocephalic and atraumatic.  Eyes:     General: No scleral icterus. Cardiovascular:     Rate and Rhythm: Normal rate and regular rhythm.     Pulses: Normal pulses.     Heart sounds: Normal heart sounds.  Pulmonary:     Effort: Pulmonary effort is normal.     Breath sounds: Normal breath sounds.  Chest:       Comments: Right breast mass appears larger on exam today.  There also appears to be some nipple discharge Abdominal:     General: Abdomen is flat. Bowel sounds are normal. There is no distension.     Palpations: Abdomen is soft.  Tenderness: There is no abdominal tenderness.  Musculoskeletal:        General: No swelling.     Cervical back: Neck supple.  Skin:    General: Skin is warm and dry.     Findings: No rash.  Neurological:     General: No focal deficit present.     Mental Status: She is alert.  Psychiatric:        Mood and Affect: Mood normal.        Behavior: Behavior normal.     LABORATORY DATA:  CBC    Component Value Date/Time   WBC 4.9 02/17/2022 1140   WBC 6.7 07/25/2021 0111   RBC 4.32 02/17/2022 1140   HGB 12.9 02/17/2022 1140   HGB 15.7 07/07/2021 1538   HCT 38.9 02/17/2022 1140   HCT 46.6 07/07/2021 1538   PLT 403 (H) 02/17/2022 1140   PLT 450 07/07/2021 1538   MCV 90.0 02/17/2022 1140   MCV 88 07/07/2021 1538   MCH 29.9 02/17/2022 1140   MCHC 33.2 02/17/2022 1140   RDW 14.6 02/17/2022 1140   RDW 12.9 07/07/2021 1538   LYMPHSABS 0.6 (L) 02/17/2022 1140   LYMPHSABS 1.6 07/07/2021 1538   MONOABS 0.7 02/17/2022 1140   EOSABS 0.1 02/17/2022 1140   EOSABS 0.1 07/07/2021 1538   BASOSABS 0.0 02/17/2022 1140   BASOSABS 0.0 07/07/2021 1538    CMP     Component Value Date/Time   NA 142 02/17/2022 1140   NA 141  07/07/2021 1538   K 3.5 02/17/2022 1140   CL 109 02/17/2022 1140   CO2 28 02/17/2022 1140   GLUCOSE 97 02/17/2022 1140   BUN 11 02/17/2022 1140   BUN 9 07/07/2021 1538   CREATININE 0.48 02/17/2022 1140   CALCIUM 9.6 02/17/2022 1140   PROT 6.8 02/17/2022 1140   PROT 7.9 07/07/2021 1538   ALBUMIN 3.6 02/17/2022 1140   ALBUMIN 4.6 07/07/2021 1538   AST 12 (L) 02/17/2022 1140   ALT 12 02/17/2022 1140   ALKPHOS 86 02/17/2022 1140   BILITOT 0.4 02/17/2022 1140   GFRNONAA >60 02/17/2022 1140   GFRAA 103 03/05/2020 1119         ASSESSMENT and THERAPY PLAN:   Malignant neoplasm of unspecified part of unspecified bronchus or lung ([place]) This is a 76 year old patient with 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  She also appears to have had some regional adenopathy including prevascular, AP window and left hilar adenopathy.  She has multiple intracranial metastatic lesions which are likely secondary to metastatic lung cancer given presence of regional adenopathy.  She is now status post SRS to the brain lesions. She started concurrent chemoradiation with CarboTaxol on August 30, 2021.  She completed chemoradiation on October 15, 2021.  She is now on single agent Keytruda every 3 weeks.   End of treatment PET/CT reviewed consistent with response in the lung, likely postradiation changes noted. PET also commented on decrease in size of the breast mass. She is here before her cycle of immunotherapy. During her last treatment, I recommended CT chest on an urgent basis because of some supraclavicular fullness on the left side and also concern for increasing size of the breast mass.  This is not done.  She most recently had MRI brain and MRA which was without definitive progression however there were some areas of concern and she will have a repeat imaging in 3 months according to  Dr. Mickeal Skinner. Will try to get the CT  imaging scheduled as soon as possible.  I also ordered a mammogram and ultrasound.  I believe the right breast mass is progressively increasing and she also appears to have a nipple discharge today.  I will try to discuss with breast surgery if there is any role for surgery. Okay to proceed with immunotherapy as planned today.  Thank you for consulting [name] in the care of this patient.  Please do not hesitate to contact us with any additional questions or concerns.  All questions were answered. The patient knows to call the clinic with any problems, questions or concerns. We can certainly see the patient much sooner if necessary.  Total encounter time:40 minutes*in face-to-face visit time, chart review, lab review, care coordination, order entry, and documentation of the encounter time.  *Total Encounter Time as defined by the Centers for Medicare and Medicaid Services includes, in addition to the face-to-face time of a patient visit (documented in the note above) non-face-to-face time: obtaining and reviewing outside history, ordering and reviewing medications, tests or procedures, care coordination (communications with other health care professionals or caregivers) and documentation in the medical record.

## 2022-02-18 ENCOUNTER — Other Ambulatory Visit: Payer: Self-pay | Admitting: Radiation Therapy

## 2022-02-18 ENCOUNTER — Telehealth: Payer: Self-pay | Admitting: *Deleted

## 2022-02-18 NOTE — Telephone Encounter (Signed)
Per Dr. Chryl Heck - patient scheduled for Diagnostic Mammogram and Breast US at [facility] on  Monday 2/12 at 1130, arrive 1115.  Contacted patient and left message with appt times, location w/ph # and provided information shared by GI- [facility] regarding fee for cancellation less than 24 hour. Encouraged patient to contact office for questions or GI-[facility] if she needs to r/s appt.

## 2022-02-19 ENCOUNTER — Other Ambulatory Visit (HOSPITAL_COMMUNITY): Payer: Self-pay

## 2022-02-21 ENCOUNTER — Other Ambulatory Visit: Payer: Self-pay

## 2022-02-22 NOTE — Telephone Encounter (Signed)
Patients sister returned call. Spoke to patients sister per signed DPR.  Patient's sister is aware and verbalized understanding.  Informed that a Mychart message was sent to patients chart for printed reference.

## 2022-02-24 ENCOUNTER — Emergency Department (HOSPITAL_COMMUNITY): Admission: EM | Admit: 2022-02-24 | Discharge: 2022-02-24 | Discharge status: Absent without leave | Payer: 59

## 2022-02-24 ENCOUNTER — Ambulatory Visit (HOSPITAL_COMMUNITY)
Admission: RE | Admit: 2022-02-24 | Discharge: 2022-02-24 | Disposition: A | Discharge status: Home / Self Care | Payer: 59 | Source: Ambulatory Visit | Attending: Hematology and Oncology | Admitting: Hematology and Oncology

## 2022-02-24 ENCOUNTER — Other Ambulatory Visit (HOSPITAL_COMMUNITY): Payer: Self-pay

## 2022-02-24 DIAGNOSIS — R911 Solitary pulmonary nodule: Secondary | ICD-10-CM | POA: Diagnosis not present

## 2022-02-24 DIAGNOSIS — C3490 Malignant neoplasm of unspecified part of unspecified bronchus or lung: Secondary | ICD-10-CM | POA: Insufficient documentation

## 2022-02-24 DIAGNOSIS — J90 Pleural effusion, not elsewhere classified: Secondary | ICD-10-CM | POA: Diagnosis not present

## 2022-02-24 DIAGNOSIS — C50919 Malignant neoplasm of unspecified site of unspecified female breast: Secondary | ICD-10-CM | POA: Diagnosis not present

## 2022-02-24 MED ORDER — IOHEXOL 300 MG/ML  SOLN
75.0000 mL | Freq: Once | INTRAMUSCULAR | Status: AC | PRN
  Administered 2022-02-24: 75 mL via INTRAVENOUS

## 2022-02-28 ENCOUNTER — Telehealth: Payer: Self-pay | Admitting: Genetic Counselor

## 2022-02-28 ENCOUNTER — Telehealth: Payer: Self-pay | Admitting: Hematology and Oncology

## 2022-02-28 ENCOUNTER — Ambulatory Visit
Admission: RE | Admit: 2022-02-28 | Discharge: 2022-02-28 | Disposition: A | Discharge status: Home / Self Care | Payer: 59 | Source: Ambulatory Visit | Attending: Hematology and Oncology | Admitting: Hematology and Oncology

## 2022-02-28 ENCOUNTER — Encounter: Payer: Self-pay | Admitting: Genetic Counselor

## 2022-02-28 ENCOUNTER — Telehealth: Payer: Self-pay | Admitting: Family Medicine

## 2022-02-28 ENCOUNTER — Other Ambulatory Visit: Payer: Self-pay | Admitting: Hematology and Oncology

## 2022-02-28 DIAGNOSIS — C3490 Malignant neoplasm of unspecified part of unspecified bronchus or lung: Secondary | ICD-10-CM

## 2022-02-28 DIAGNOSIS — C50919 Malignant neoplasm of unspecified site of unspecified female breast: Secondary | ICD-10-CM

## 2022-02-28 DIAGNOSIS — N6489 Other specified disorders of breast: Secondary | ICD-10-CM | POA: Diagnosis not present

## 2022-02-28 DIAGNOSIS — R928 Other abnormal and inconclusive findings on diagnostic imaging of breast: Secondary | ICD-10-CM | POA: Diagnosis not present

## 2022-02-28 DIAGNOSIS — Z1379 Encounter for other screening for genetic and chromosomal anomalies: Secondary | ICD-10-CM | POA: Insufficient documentation

## 2022-02-28 NOTE — Telephone Encounter (Signed)
Spoke with patient niece confirming upcoming appointments

## 2022-02-28 NOTE — Telephone Encounter (Signed)
These are normal blood sugars.  If she is symptomatic?  I am not quite sure what the concern is.  Can you please clarify?

## 2022-02-28 NOTE — Telephone Encounter (Signed)
LM on VM that results are back and to please call.  Left CB instructions. 

## 2022-03-01 NOTE — Telephone Encounter (Signed)
Lmtcb.

## 2022-03-01 NOTE — Telephone Encounter (Signed)
Larose Kells R/C for pt Please call [phone]

## 2022-03-01 NOTE — Telephone Encounter (Signed)
Called patient she is states Jennifer Cowan wanted the numbers called back to her because she was lowering her insulin. Patient aware [name] will be her tomorrow to address.

## 2022-03-02 NOTE — Telephone Encounter (Signed)
Pt's sister is aware of provider feedback and voiced understanding.

## 2022-03-02 NOTE — Telephone Encounter (Signed)
R/c

## 2022-03-02 NOTE — Telephone Encounter (Signed)
Ok to discontinue insulin

## 2022-03-03 ENCOUNTER — Inpatient Hospital Stay: Payer: 59

## 2022-03-03 ENCOUNTER — Encounter: Payer: Self-pay | Admitting: Adult Health

## 2022-03-03 ENCOUNTER — Telehealth: Payer: Self-pay | Admitting: *Deleted

## 2022-03-03 ENCOUNTER — Inpatient Hospital Stay (HOSPITAL_BASED_OUTPATIENT_CLINIC_OR_DEPARTMENT_OTHER): Payer: 59 | Admitting: Adult Health

## 2022-03-03 ENCOUNTER — Other Ambulatory Visit: Payer: Self-pay

## 2022-03-03 VITALS — BP 122/55 | HR 106 | Temp 97.9°F | Resp 18 | Ht 68.0 in | Wt 171.8 lb

## 2022-03-03 DIAGNOSIS — Z87820 Personal history of traumatic brain injury: Secondary | ICD-10-CM | POA: Diagnosis not present

## 2022-03-03 DIAGNOSIS — G9389 Other specified disorders of brain: Secondary | ICD-10-CM | POA: Diagnosis not present

## 2022-03-03 DIAGNOSIS — J90 Pleural effusion, not elsewhere classified: Secondary | ICD-10-CM

## 2022-03-03 DIAGNOSIS — Z5111 Encounter for antineoplastic chemotherapy: Secondary | ICD-10-CM | POA: Diagnosis not present

## 2022-03-03 DIAGNOSIS — C3490 Malignant neoplasm of unspecified part of unspecified bronchus or lung: Secondary | ICD-10-CM

## 2022-03-03 DIAGNOSIS — C50919 Malignant neoplasm of unspecified site of unspecified female breast: Secondary | ICD-10-CM | POA: Diagnosis not present

## 2022-03-03 DIAGNOSIS — C3492 Malignant neoplasm of unspecified part of left bronchus or lung: Secondary | ICD-10-CM

## 2022-03-03 DIAGNOSIS — Z7982 Long term (current) use of aspirin: Secondary | ICD-10-CM | POA: Diagnosis not present

## 2022-03-03 DIAGNOSIS — R739 Hyperglycemia, unspecified: Secondary | ICD-10-CM

## 2022-03-03 DIAGNOSIS — Z95828 Presence of other vascular implants and grafts: Secondary | ICD-10-CM

## 2022-03-03 DIAGNOSIS — R5381 Other malaise: Secondary | ICD-10-CM | POA: Diagnosis not present

## 2022-03-03 DIAGNOSIS — C7931 Secondary malignant neoplasm of brain: Secondary | ICD-10-CM | POA: Diagnosis not present

## 2022-03-03 DIAGNOSIS — R918 Other nonspecific abnormal finding of lung field: Secondary | ICD-10-CM | POA: Diagnosis not present

## 2022-03-03 DIAGNOSIS — Z79899 Other long term (current) drug therapy: Secondary | ICD-10-CM | POA: Diagnosis not present

## 2022-03-03 LAB — CMP (CANCER CENTER ONLY)
ALT: 20 U/L (ref 0–44)
AST: 17 U/L (ref 15–41)
Albumin: 3.9 g/dL (ref 3.5–5.0)
Alkaline Phosphatase: 91 U/L (ref 38–126)
Anion gap: 7 (ref 5–15)
BUN: 11 mg/dL (ref 8–23)
CO2: 26 mmol/L (ref 22–32)
Calcium: 9.7 mg/dL (ref 8.9–10.3)
Chloride: 108 mmol/L (ref 98–111)
Creatinine: 0.47 mg/dL (ref 0.44–1.00)
GFR, Estimated: >60 mL/min (ref >60)
Glucose, Bld: 97 mg/dL (ref 70–99)
Potassium: 3.9 mmol/L (ref 3.5–5.1)
Sodium: 141 mmol/L (ref 135–145)
Total Bilirubin: 0.4 mg/dL (ref 0.3–1.2)
Total Protein: 7 g/dL (ref 6.5–8.1)

## 2022-03-03 LAB — LACTATE DEHYDROGENASE: LDH: 209 U/L — ABNORMAL HIGH (ref 98–192)

## 2022-03-03 MED ORDER — SODIUM CHLORIDE 0.9% FLUSH
10.0000 mL | Freq: Once | INTRAVENOUS | Status: AC
  Administered 2022-03-03: 10 mL

## 2022-03-03 MED ORDER — HEPARIN SOD (PORK) LOCK FLUSH 100 UNIT/ML IV SOLN
500.0000 [IU] | Freq: Once | INTRAVENOUS | Status: AC
  Administered 2022-03-03: 500 [IU]

## 2022-03-03 NOTE — Progress Notes (Signed)
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  The report date is February 27, 2022.  The CancerNext-Expanded gene panel offered by [facility] and includes sequencing and rearrangement analysis for the following 77 genes: AIP, ALK, APC*, ATM*, AXIN2, BAP1, BARD1, BLM, BMPR1A, BRCA1*, BRCA2*, BRIP1*, CDC73, CDH1*, CDK4, CDKN1B, CDKN2A, CHEK2*, CTNNA1, DICER1, FANCC, FH, FLCN, GALNT12, KIF1B, LZTR1, MAX, MEN1, MET, MLH1*, MSH2*, MSH3, MSH6*, MUTYH*, NBN, NF1*, NF2, NTHL1, PALB2*, PHOX2B, PMS2*, POT1, PRKAR1A, PTCH1, PTEN*, RAD51C*, RAD51D*, RB1, RECQL, RET, SDHA, SDHAF2, SDHB, SDHC, SDHD, SMAD4, SMARCA4, SMARCB1, SMARCE1, STK11, SUFU, TMEM127, TP53*, TSC1, TSC2, VHL and XRCC2 (sequencing and deletion/duplication); EGFR, EGLN1, HOXB13, KIT, MITF, PDGFRA, POLD1, and POLE (sequencing only); EPCAM and GREM1 (deletion/duplication only). DNA and RNA analyses performed for * genes.    Malignant neoplasm of unspecified part of unspecified bronchus or lung ([place])  08/13/2021 Initial Diagnosis   Malignant neoplasm of unspecified part of unspecified bronchus or lung ([place])   08/30/2021 - 09/13/2021 Chemotherapy   Patient is on Treatment Plan : LUNG Carboplatin / Paclitaxel + XRT q7d     08/30/2021 - 10/11/2021 Chemotherapy   Patient is on Treatment Plan : LUNG Carboplatin + Paclitaxel + XRT q7d     11/16/2021 -  Chemotherapy   Patient is on Treatment Plan : LUNG Pembrolizumab Q 21D       CURRENT THERAPY: Pembrolizumab  INTERVAL HISTORY: Jennifer Cowan 77 y.o. female returns for follow-up after undergoing CT chest on February 24, 2022.  This scan  demonstrated a left upper lung dense consolidation or atelectasis with nonvisualization of left upper lobe mass observed on the prior study, new large layering left-sided pleural effusion and moderate pericardial effusion, resolution of mediastinal and left axillary adenopathy consistent with therapy response, right breast mass demonstrating a partial response with decrease in size, and abnormalities in the bilateral partially imaged kidneys.  Numerous hepatic cysts.  She also underwent bilateral breast mammogram and ultrasound on February 26, 2022.  This demonstrated that the mass was 6 x 4.8 x 5.2 cm and previously was 5.9 x 3.6 x 4.5 cm.  There is more bulk noted to the deep aspect of the mass along with new skin thickening along the anterior and medial aspect of the right breast with trabecular thickening throughout the medial and inferior right breast.  There was no suspicious right axillary lymphadenopathy noted.  Jennifer Cowan is here with her sister and niece.  She denies any shortness of breath, cough, fever or chills.  She does note some pain in the right breast that radiates on occasion.   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  MEDICAL HISTORY: Past Medical History:  Diagnosis Date   Anemia    Arthritis    Breast cancer ([place])    Cancer ([place]) 1999   Breast Cancer   Family history of breast cancer    Hyperlipidemia    Hypertension    Osteopenia 01/12/2021   Personal history of malignant neoplasm of breast     SURGICAL HISTORY: Past Surgical History:  Procedure Laterality Date   BREAST SURGERY  1999   Masectomy- left   BRONCHIAL BIOPSY  07/30/2021   Procedure: BRONCHIAL BIOPSIES;  Surgeon: Collene Gobble, MD;  Location: [facility] ENDOSCOPY;  Service: Pulmonary;;   BRONCHIAL BRUSHINGS  07/30/2021   Procedure: BRONCHIAL BRUSHINGS;  Surgeon: Collene Gobble, MD;  Location: [facility] ENDOSCOPY;  Service: Pulmonary;;   BRONCHIAL NEEDLE ASPIRATION BIOPSY  07/30/2021   Procedure: BRONCHIAL NEEDLE ASPIRATION BIOPSIES;  Surgeon: Collene Gobble, MD;  Location: [facility] ENDOSCOPY;  Service: Pulmonary;;   CYSTECTOMY     cyst removed off top of head   IR IMAGING GUIDED PORT INSERTION  08/20/2021   MASTECTOMY     VIDEO BRONCHOSCOPY WITH RADIAL ENDOBRONCHIAL ULTRASOUND  07/30/2021   Procedure: VIDEO BRONCHOSCOPY WITH RADIAL ENDOBRONCHIAL ULTRASOUND;  Surgeon: Collene Gobble, MD;  Location: [facility] ENDOSCOPY;  Service: Pulmonary;;    SOCIAL HISTORY: Social History   Socioeconomic History   Marital status: Single    Spouse name: Not on file   Number of children: 0   Years of education: 12   Highest education level: High school graduate  Occupational History   Occupation: retired  Tobacco Use   Smoking status: Never   Smokeless tobacco: Never  Vaping Use   Vaping Use: Never used  Substance and Sexual Activity   Alcohol use: No   Drug use: Never   Sexual activity: Yes    Birth control/protection: Post-menopausal  Other Topics Concern   Not on file  Social History Narrative   Not on file   Social Determinants  of Health   Financial Resource Strain: Not on file  Food Insecurity: Not on file  Transportation Needs: Not on file  Physical Activity: Not on file  Stress: Not on file  Social Connections: Not on file  Intimate Partner Violence: Not on file    FAMILY HISTORY: Family History  Problem Relation Age of Onset   Arthritis Mother    Breast cancer Sister 23   Diabetes Sister    Hyperlipidemia Sister    Hypertension Sister    Stroke Sister    Dementia Sister    Diabetes Brother    Hypertension Brother    Breast cancer Niece 55    Review of Systems  Constitutional:  Positive for fatigue. Negative for appetite change, chills, fever and unexpected weight change.  HENT:   Negative for hearing loss, lump/mass and trouble swallowing.   Eyes:  Negative for eye problems and icterus.  Respiratory:  Negative for chest tightness, cough and shortness of breath.   Cardiovascular:  Negative for chest pain, leg swelling and palpitations.  Gastrointestinal:  Negative for abdominal distention, abdominal pain, constipation, diarrhea, nausea and vomiting.  Endocrine: Negative for hot flashes.  Genitourinary:  Negative for difficulty urinating.  Musculoskeletal:  Negative for arthralgias.  Skin:  Negative for itching and rash.  Neurological:  Negative for dizziness, extremity weakness, headaches and numbness.  Hematological:  Negative for adenopathy. Does not bruise/bleed easily.  Psychiatric/Behavioral:  Negative for depression. The patient is not nervous/anxious.       PHYSICAL EXAMINATION  ECOG PERFORMANCE STATUS: 1 - Symptomatic but completely ambulatory  Vitals:   03/03/22 1008  BP: (!) 122/55  Pulse: (!) 106  Resp: 18  Temp: 97.9 F (36.6 C)  SpO2: 97%    Physical Exam Constitutional:      General: She is not in acute distress.    Appearance: Normal appearance. She is not toxic-appearing.  HENT:     Head: Normocephalic and atraumatic.  Eyes:     General: No scleral  icterus. Cardiovascular:     Rate and Rhythm: Normal rate and regular rhythm.     Pulses: Normal pulses.     Heart sounds: Normal heart sounds.  Pulmonary:     Effort: Pulmonary effort is normal.     Breath sounds: Normal breath sounds.     Comments: Decreased air movement in lung left lung. Abdominal:     General: Abdomen is flat. Bowel sounds are normal. There is no distension.     Palpations: Abdomen is soft.     Tenderness: There is no abdominal tenderness.  Musculoskeletal:        General: No swelling.     Cervical back: Neck supple.  Lymphadenopathy:     Cervical: No cervical adenopathy.  Skin:    General: Skin is warm and dry.     Findings: No rash.  Neurological:     General: No focal deficit present.     Mental Status: She is alert.  Psychiatric:        Mood and Affect: Mood normal.        Behavior: Behavior normal.     LABORATORY DATA:  CBC    Component Value Date/Time   WBC 4.9 02/17/2022 1140   WBC 6.7 07/25/2021 0111   RBC 4.32 02/17/2022 1140   HGB 12.9 02/17/2022 1140   HGB 15.7 07/07/2021 1538   HCT 38.9 02/17/2022 1140   HCT 46.6 07/07/2021 1538   PLT 403 (H) 02/17/2022 1140   PLT 450 07/07/2021 1538   MCV 90.0 02/17/2022 1140   MCV 88 07/07/2021 1538   MCH 29.9 02/17/2022 1140   MCHC 33.2 02/17/2022 1140   RDW 14.6 02/17/2022 1140   RDW 12.9 07/07/2021 1538   LYMPHSABS 0.6 (L) 02/17/2022 1140   LYMPHSABS 1.6 07/07/2021 1538   MONOABS 0.7 02/17/2022 1140   EOSABS 0.1 02/17/2022 1140   EOSABS 0.1 07/07/2021 1538   BASOSABS 0.0 02/17/2022 1140   BASOSABS 0.0 07/07/2021 1538    CMP     Component Value Date/Time   NA 142 02/17/2022 1140   NA 141 07/07/2021 1538   K 3.5 02/17/2022 1140   CL 109 02/17/2022 1140   CO2 28 02/17/2022 1140   GLUCOSE 97 02/17/2022 1140   BUN 11 02/17/2022 1140   BUN 9 07/07/2021 1538   CREATININE 0.48 02/17/2022 1140   CALCIUM 9.6 02/17/2022 1140   PROT 6.8 02/17/2022 1140   PROT 7.9 07/07/2021 1538    ALBUMIN 3.6 02/17/2022 1140   ALBUMIN 4.6 07/07/2021 1538   AST 12 (L) 02/17/2022 1140   ALT 12 02/17/2022 1140   ALKPHOS 86 02/17/2022 1140   BILITOT 0.4 02/17/2022 1140   GFRNONAA >60 02/17/2022  [place] 03/05/2020 1119        ASSESSMENT and THERAPY PLAN:   Malignant neoplasm of unspecified part of unspecified bronchus or lung (HCC) Jennifer Cowan is here for follow-up and discussion of her CT scans and mammogram.  Of note she has previous lung cancer that has undergone treatment and she currently is on pembrolizumab every 3 weeks.  I reviewed the new pleural effusion with her commendations understand the etiology of the pleural effusion.  If this pleural effusion is reactive and nonmalignant the treatment for her growing right breast mass will be different than if the pleural effusion is malignant.  We will set up labs today to evaluate her serum LDH and protein.  I have ordered ultrasound-guided thoracentesis that can hopefully occur early next week and that ordered testing of pleural LDH, protein, and cytology.  Labs were ordered per "[facility]'s Criteria".    Jennifer Cowan will return next week for labs, follow-up with Dr. Chryl Heck, and evaluation prior to receiving pembrolizumab.  I met with Dr. Chryl Heck and we discussed this case and reviewed it in detail and she is in agreement with this plan and helped to formulate it.  All questions were answered. The patient knows to call the clinic with any problems, questions or concerns. We can certainly see the patient much sooner if necessary.  Total encounter time:30 minutes*in face-to-face visit time, chart review, lab review, care coordination, order entry, and documentation of the encounter time.    Wilber Bihari, NP 03/03/22 11:24 AM Medical Oncology and Hematology [facility] [place], [place] [address] Tel. [phone]    Fax. [phone]  *Total Encounter Time as defined by the Centers for Medicare and Medicaid  Services includes, in addition to the face-to-face time of a patient visit (documented in the note above) non-face-to-face time: obtaining and reviewing outside history, ordering and reviewing medications, tests or procedures, care coordination (communications with other health care professionals or caregivers) and documentation in the medical record.

## 2022-03-03 NOTE — Telephone Encounter (Signed)
Left second message on VM that results were back and to please call.

## 2022-03-03 NOTE — Assessment & Plan Note (Signed)
Jennifer Cowan is here for follow-up and discussion of her CT scans and mammogram.  Of note she has previous lung cancer that has undergone treatment and she currently is on pembrolizumab every 3 weeks.  I reviewed the new pleural effusion with her commendations understand the etiology of the pleural effusion.  If this pleural effusion is reactive and nonmalignant the treatment for her growing right breast mass will be different than if the pleural effusion is malignant.  We will set up labs today to evaluate her serum LDH and protein.  I have ordered ultrasound-guided thoracentesis that can hopefully occur early next week and that ordered testing of pleural LDH, protein, and cytology.  Labs were ordered per "[facility]'s Criteria".    Jennifer Cowan will return next week for labs, follow-up with Dr. Chryl Heck, and evaluation prior to receiving pembrolizumab.

## 2022-03-03 NOTE — Telephone Encounter (Signed)
Per NP request ,RN scheduled thoracentesis.  Pt niece [name] notified of appt date and time and verbalized understanding.

## 2022-03-04 ENCOUNTER — Telehealth: Payer: Self-pay | Admitting: Family Medicine

## 2022-03-04 NOTE — Telephone Encounter (Signed)
Can just check fasting blood sugars for now. Call for fasting BS persistently over 130.

## 2022-03-04 NOTE — Telephone Encounter (Signed)
Pts family says that PCP changed her insulin but they have questions about it and also about how many times they are supposed to check pts sugar level?  Please advise and call back.  [phone]

## 2022-03-04 NOTE — Telephone Encounter (Signed)
Did you want them to just do FBS or does she need to check them still, since you discontinued insulin.

## 2022-03-05 ENCOUNTER — Other Ambulatory Visit: Payer: Self-pay | Admitting: Adult Health

## 2022-03-05 ENCOUNTER — Ambulatory Visit (HOSPITAL_COMMUNITY)
Admission: RE | Admit: 2022-03-05 | Discharge: 2022-03-05 | Disposition: A | Discharge status: Home / Self Care | Payer: 59 | Source: Ambulatory Visit | Attending: Adult Health | Admitting: Adult Health

## 2022-03-05 DIAGNOSIS — C3490 Malignant neoplasm of unspecified part of unspecified bronchus or lung: Secondary | ICD-10-CM | POA: Diagnosis not present

## 2022-03-05 DIAGNOSIS — J90 Pleural effusion, not elsewhere classified: Secondary | ICD-10-CM | POA: Insufficient documentation

## 2022-03-05 DIAGNOSIS — C3492 Malignant neoplasm of unspecified part of left bronchus or lung: Secondary | ICD-10-CM

## 2022-03-07 ENCOUNTER — Encounter: Payer: Self-pay | Admitting: Hematology and Oncology

## 2022-03-07 NOTE — Telephone Encounter (Signed)
Pt's sister aware of provider feedback and voiced understanding.

## 2022-03-07 NOTE — Telephone Encounter (Signed)
Sister aware of provider feedback and voiced understanding.

## 2022-03-07 NOTE — Telephone Encounter (Signed)
LM for the third time that results are back and to please call.  I will send an EPIC message to the patient and also a written letter.

## 2022-03-07 NOTE — Telephone Encounter (Signed)
Pt sister r/c

## 2022-03-08 ENCOUNTER — Encounter: Payer: Self-pay | Admitting: Genetic Counselor

## 2022-03-09 ENCOUNTER — Encounter: Payer: Self-pay | Admitting: Genetic Counselor

## 2022-03-09 NOTE — Progress Notes (Signed)
Pt did not wish to stay for their palliative appt, canceled and will r/s

## 2022-03-10 ENCOUNTER — Other Ambulatory Visit: Payer: Self-pay

## 2022-03-10 ENCOUNTER — Inpatient Hospital Stay (HOSPITAL_BASED_OUTPATIENT_CLINIC_OR_DEPARTMENT_OTHER): Payer: 59 | Admitting: Hematology and Oncology

## 2022-03-10 ENCOUNTER — Inpatient Hospital Stay: Payer: 59 | Admitting: Nurse Practitioner

## 2022-03-10 ENCOUNTER — Inpatient Hospital Stay: Payer: 59

## 2022-03-10 VITALS — BP 132/59 | HR 101 | Temp 97.8°F | Resp 17 | Ht 68.0 in | Wt 171.0 lb

## 2022-03-10 DIAGNOSIS — C3490 Malignant neoplasm of unspecified part of unspecified bronchus or lung: Secondary | ICD-10-CM

## 2022-03-10 DIAGNOSIS — Z87820 Personal history of traumatic brain injury: Secondary | ICD-10-CM | POA: Diagnosis not present

## 2022-03-10 DIAGNOSIS — Z5111 Encounter for antineoplastic chemotherapy: Secondary | ICD-10-CM | POA: Diagnosis not present

## 2022-03-10 DIAGNOSIS — R739 Hyperglycemia, unspecified: Secondary | ICD-10-CM

## 2022-03-10 DIAGNOSIS — C7931 Secondary malignant neoplasm of brain: Secondary | ICD-10-CM | POA: Diagnosis not present

## 2022-03-10 DIAGNOSIS — Z79899 Other long term (current) drug therapy: Secondary | ICD-10-CM | POA: Diagnosis not present

## 2022-03-10 DIAGNOSIS — Z7982 Long term (current) use of aspirin: Secondary | ICD-10-CM | POA: Diagnosis not present

## 2022-03-10 DIAGNOSIS — Z95828 Presence of other vascular implants and grafts: Secondary | ICD-10-CM

## 2022-03-10 LAB — CBC WITH DIFFERENTIAL (CANCER CENTER ONLY)
Abs Immature Granulocytes: 0.03 10*3/uL (ref 0.00–0.07)
Basophils Absolute: 0 10*3/uL (ref 0.0–0.1)
Basophils Relative: 1 %
Eosinophils Absolute: 0.1 10*3/uL (ref 0.0–0.5)
Eosinophils Relative: 2 %
HCT: 39.2 % (ref 36.0–46.0)
Hemoglobin: 13.1 g/dL (ref 12.0–15.0)
Immature Granulocytes: 1 %
Lymphocytes Relative: 13 %
Lymphs Abs: 0.7 10*3/uL (ref 0.7–4.0)
MCH: 30.3 pg (ref 26.0–34.0)
MCHC: 33.4 g/dL (ref 30.0–36.0)
MCV: 90.5 fL (ref 80.0–100.0)
Monocytes Absolute: 0.7 10*3/uL (ref 0.1–1.0)
Monocytes Relative: 13 %
Neutro Abs: 3.8 10*3/uL (ref 1.7–7.7)
Neutrophils Relative %: 70 %
Platelet Count: 385 10*3/uL (ref 150–400)
RBC: 4.33 MIL/uL (ref 3.87–5.11)
RDW: 14.6 % (ref 11.5–15.5)
WBC Count: 5.3 10*3/uL (ref 4.0–10.5)
nRBC: 0 % (ref 0.0–0.2)

## 2022-03-10 LAB — CMP (CANCER CENTER ONLY)
ALT: 17 U/L (ref 0–44)
AST: 15 U/L (ref 15–41)
Albumin: 3.8 g/dL (ref 3.5–5.0)
Alkaline Phosphatase: 95 U/L (ref 38–126)
Anion gap: 7 (ref 5–15)
BUN: 16 mg/dL (ref 8–23)
CO2: 26 mmol/L (ref 22–32)
Calcium: 9.4 mg/dL (ref 8.9–10.3)
Chloride: 109 mmol/L (ref 98–111)
Creatinine: 0.49 mg/dL (ref 0.44–1.00)
GFR, Estimated: >60 mL/min (ref >60)
Glucose, Bld: 178 mg/dL — ABNORMAL HIGH (ref 70–99)
Potassium: 3.6 mmol/L (ref 3.5–5.1)
Sodium: 142 mmol/L (ref 135–145)
Total Bilirubin: 0.4 mg/dL (ref 0.3–1.2)
Total Protein: 6.8 g/dL (ref 6.5–8.1)

## 2022-03-10 LAB — TSH: TSH: 2.039 u[IU]/mL (ref 0.350–4.500)

## 2022-03-10 MED ORDER — SODIUM CHLORIDE 0.9% FLUSH
10.0000 mL | Freq: Once | INTRAVENOUS | Status: AC
  Administered 2022-03-10: 10 mL

## 2022-03-10 NOTE — Progress Notes (Signed)
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  The report date is February 27, 2022.  The CancerNext-Expanded gene panel offered by [facility] and includes sequencing and rearrangement analysis for the following 77 genes: AIP, ALK, APC*, ATM*, AXIN2, BAP1, BARD1, BLM, BMPR1A, BRCA1*, BRCA2*, BRIP1*, CDC73, CDH1*, CDK4, CDKN1B, CDKN2A, CHEK2*, CTNNA1, DICER1, FANCC, FH, FLCN, GALNT12, KIF1B, LZTR1, MAX, MEN1, MET, MLH1*, MSH2*, MSH3, MSH6*, MUTYH*, NBN, NF1*, NF2, NTHL1, PALB2*, PHOX2B, PMS2*, POT1, PRKAR1A, PTCH1, PTEN*, RAD51C*, RAD51D*, RB1, RECQL, RET, SDHA, SDHAF2, SDHB, SDHC, SDHD, SMAD4, SMARCA4, SMARCB1, SMARCE1, STK11, SUFU, TMEM127, TP53*, TSC1, TSC2, VHL and XRCC2 (sequencing and deletion/duplication); EGFR, EGLN1, HOXB13, KIT, MITF, PDGFRA, POLD1, and POLE (sequencing only); EPCAM and GREM1 (deletion/duplication only). DNA and RNA analyses performed for * genes.    Malignant neoplasm of unspecified part of unspecified bronchus or lung ([place])  08/13/2021 Initial Diagnosis   Malignant neoplasm of unspecified part of unspecified bronchus or lung ([place])   08/30/2021 - 09/13/2021 Chemotherapy   Patient is on Treatment Plan : LUNG Carboplatin / Paclitaxel + XRT q7d     08/30/2021 - 10/11/2021 Chemotherapy   Patient is on Treatment Plan : LUNG Carboplatin + Paclitaxel + XRT q7d     11/16/2021 - 02/17/2022 Chemotherapy   Patient is on Treatment Plan : LUNG Pembrolizumab Q 21D     03/16/2022 - 03/16/2022 Chemotherapy   Patient is on Treatment Plan : BREAST Pembrolizumab (400) D1 + Carboplatin (1.5) weekly + Paclitaxel (80) weekly q42d X 2  cycles / Pembrolizumab (400) D1 + AC D1,22 q42d x 2 cycles     03/16/2022 -  Chemotherapy   Patient is on Treatment Plan : BREAST Pembrolizumab (200) D1 + Carboplatin (2) D1,8,15 + Paclitaxel (45) D1,8,15 q21d X 2 cycles       CURRENT THERAPY: Pembrolizumab  INTERVAL HISTORY:  Jennifer Cowan 77 y.o. female returns for follow-up after undergoing CT chest on February 24, 2022.  This scan demonstrated a left upper lung dense consolidation or atelectasis with nonvisualization of left upper lobe mass observed on the prior study, new large layering left-sided pleural effusion and moderate pericardial effusion, resolution of mediastinal and left axillary adenopathy consistent with therapy response, right breast mass demonstrating a partial response with decrease in size, and abnormalities in the bilateral partially imaged kidneys.  Numerous hepatic cysts.    She also underwent bilateral breast mammogram and ultrasound on February 26, 2022.  This demonstrated that the mass was 6 x 4.8 x 5.2 cm and previously was 5.9 x 3.6 x 4.5 cm.  There is more bulk noted to the deep aspect of the mass along with new skin thickening along the anterior and medial aspect of the right breast with trabecular thickening throughout the medial and inferior right breast.  There was no suspicious right axillary lymphadenopathy noted.  Jennifer Cowan is here with her sister and niece.  She recently saw Jennifer Cowan our APP< we ordered an [name] to investigate this large pleural effusion. According to [name], there is no fluid. Clinically, she remains asymptomatic. No chest pain or SOB. She is doing well, she agrees that the breast mass is increasing in size.  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  MEDICAL HISTORY: Past Medical History:  Diagnosis Date   Anemia    Arthritis    Breast cancer ([place])    Cancer ([place]) 1999   Breast Cancer   Family history of breast cancer    Hyperlipidemia    Hypertension    Osteopenia 01/12/2021   Personal history of malignant neoplasm of breast     SURGICAL HISTORY: Past Surgical History:  Procedure Laterality Date   BREAST SURGERY  1999   Masectomy- left   BRONCHIAL BIOPSY  07/30/2021   Procedure: BRONCHIAL BIOPSIES;  Surgeon: Collene Gobble, MD;  Location[facility] ENDOSCOPY;  Service: Pulmonary;;   BRONCHIAL BRUSHINGS  07/30/2021   Procedure: BRONCHIAL BRUSHINGS;  Surgeon: Collene Gobble, MD;  Location: [facility] ENDOSCOPY;  Service: Pulmonary;;   BRONCHIAL NEEDLE ASPIRATION BIOPSY  07/30/2021   Procedure: BRONCHIAL NEEDLE ASPIRATION BIOPSIES;  Surgeon: Collene Gobble, MD;  Location: [facility] ENDOSCOPY;  Service: Pulmonary;;   CYSTECTOMY     cyst removed off top of head   IR IMAGING GUIDED PORT INSERTION  08/20/2021   MASTECTOMY     VIDEO BRONCHOSCOPY WITH RADIAL ENDOBRONCHIAL ULTRASOUND  07/30/2021   Procedure: VIDEO BRONCHOSCOPY WITH RADIAL ENDOBRONCHIAL ULTRASOUND;  Surgeon: Collene Gobble, MD;  Location: [facility] ENDOSCOPY;  Service: Pulmonary;;    SOCIAL HISTORY: Social History   Socioeconomic History   Marital status: Single     Spouse name: Not on file   Number of children: 0   Years of education: 12   Highest education level: High school graduate  Occupational History   Occupation: retired  Tobacco Use   Smoking status: Never   Smokeless tobacco: Never  Vaping Use   Vaping Use: Never used  Substance and Sexual Activity   Alcohol use: No   Drug use: Never   Sexual activity: Yes    Birth control/protection: Post-menopausal  Other Topics Concern   Not on file  Social History Narrative   Not on file   Social Determinants of Health   Financial Resource Strain: Not on file  Food Insecurity: Not on file  Transportation Needs: Not on file  Physical Activity: Not on file  Stress: Not on file  Social Connections: Not on file  Intimate Partner Violence: Not on file    FAMILY HISTORY: Family History  Problem Relation Age of Onset   Arthritis Mother    Breast cancer Sister 33   Diabetes Sister    Hyperlipidemia Sister    Hypertension Sister    Stroke Sister    Dementia Sister    Diabetes Brother    Hypertension Brother  Breast cancer Niece 24    Review of Systems  Constitutional:  Positive for fatigue. Negative for appetite change, chills, fever and unexpected weight change.  HENT:   Negative for hearing loss, lump/mass and trouble swallowing.   Eyes:  Negative for eye problems and icterus.  Respiratory:  Negative for chest tightness, cough and shortness of breath.   Cardiovascular:  Negative for chest pain, leg swelling and palpitations.  Gastrointestinal:  Negative for abdominal distention, abdominal pain, constipation, diarrhea, nausea and vomiting.  Endocrine: Negative for hot flashes.  Genitourinary:  Negative for difficulty urinating.   Musculoskeletal:  Negative for arthralgias.  Skin:  Negative for itching and rash.  Neurological:  Negative for dizziness, extremity weakness, headaches and numbness.  Hematological:  Negative for adenopathy. Does not bruise/bleed easily.   Psychiatric/Behavioral:  Negative for depression. The patient is not nervous/anxious.       PHYSICAL EXAMINATION  ECOG PERFORMANCE STATUS: 1 - Symptomatic but completely ambulatory  Vitals:   03/10/22 1303  BP: (!) 132/59  Pulse: (!) 101  Resp: 17  Temp: 97.8 F (36.6 C)  SpO2: 98%    Physical Exam Constitutional:      General: She is not in acute distress.    Appearance: Normal appearance. She is not toxic-appearing.  HENT:     Head: Normocephalic and atraumatic.  Eyes:     General: No scleral icterus. Cardiovascular:     Rate and Rhythm: Normal rate and regular rhythm.     Pulses: Normal pulses.     Heart sounds: Normal heart sounds.     Comments: Heart sounds clear and loud, no suggestion of large pericardial effusion. Pulmonary:     Effort: Pulmonary effort is normal.     Breath sounds: Normal breath sounds.     Comments: No significant findings suggestive of effusion, if any this must be small Abdominal:     General: Abdomen is flat. Bowel sounds are normal. There is no distension.     Palpations: Abdomen is soft.     Tenderness: There is no abdominal tenderness.  Musculoskeletal:        General: No swelling.     Cervical back: Neck supple.  Lymphadenopathy:     Cervical: No cervical adenopathy.  Skin:    General: Skin is warm and dry.     Findings: No rash.  Neurological:     General: No focal deficit present.     Mental Status: She is alert.  Psychiatric:        Mood and Affect: Mood normal.        Behavior: Behavior normal.     LABORATORY DATA:  CBC    Component Value Date/Time   WBC 5.3 03/10/2022 1216   WBC 6.7 07/25/2021 0111   RBC 4.33 03/10/2022 1216   HGB 13.1 03/10/2022 1216   HGB 15.7 07/07/2021 1538   HCT 39.2 03/10/2022 1216   HCT 46.6 07/07/2021 1538   PLT 385 03/10/2022 1216   PLT 450 07/07/2021 1538   MCV 90.5 03/10/2022 1216   MCV 88 07/07/2021 1538   MCH 30.3 03/10/2022 1216   MCHC 33.4 03/10/2022 1216   RDW 14.6  03/10/2022 1216   RDW 12.9 07/07/2021 1538   LYMPHSABS 0.7 03/10/2022 1216   LYMPHSABS 1.6 07/07/2021 1538   MONOABS 0.7 03/10/2022 1216   EOSABS 0.1 03/10/2022 1216   EOSABS 0.1 07/07/2021 1538   BASOSABS 0.0 03/10/2022 1216   BASOSABS 0.0 07/07/2021 1538    CMP  Component Value Date/Time   NA 142 03/10/2022 1216   NA 141 07/07/2021 1538   K 3.6 03/10/2022 1216   CL 109 03/10/2022 1216   CO2 26 03/10/2022 1216   GLUCOSE 178 (H) 03/10/2022 1216   BUN 16 03/10/2022 1216   BUN 9 07/07/2021 1538   CREATININE 0.49 03/10/2022 1216   CALCIUM 9.4 03/10/2022 1216   PROT 6.8 03/10/2022 1216   PROT 7.9 07/07/2021 1538   ALBUMIN 3.8 03/10/2022 1216   ALBUMIN 4.6 07/07/2021 1538   AST 15 03/10/2022 1216   ALT 17 03/10/2022 1216   ALKPHOS 95 03/10/2022 1216   BILITOT 0.4 03/10/2022 1216   GFRNONAA >60 03/10/2022 1216   GFRAA 103 03/05/2020 1119        ASSESSMENT and THERAPY PLAN:   Malignant neoplasm of unspecified part of unspecified bronchus or lung ([place]) This is a 77 year old patient with 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  She also appears to have had some regional adenopathy including prevascular, AP window and left hilar adenopathy.  She has multiple intracranial metastatic lesions which are likely secondary to metastatic lung cancer given presence of regional adenopathy.  She is now status post SRS to the brain lesions. She started concurrent chemoradiation with CarboTaxol on August 30, 2021.  She completed chemoradiation on October 15, 2021.  She is now on single agent Keytruda every 3 weeks.   End of treatment PET/CT reviewed consistent with response in the lung, likely postradiation changes noted. PET also commented on decrease in size of the breast mass. She however now appears to have increasing breast mass on exam and by recent imaging Although CT comments on large pleural  effusion, clinically and by [name], there is no significant effusion I will order an ECHO to investigate the pericardial effusion, once again, no clinically significant effusion. Will change regimen to weekly carbotaxol with every 21 day [place] for 6 weeks and re image. If no concern for metastatic disease, We can discuss about her in Breast [place] for further recommendations.  Thank you for consulting [name] in the care of this patient.  Please do not hesitate to contact us with any additional questions or concerns.   *Total Encounter Time as defined by the Centers for Medicare and Medicaid Services includes, in addition to the face-to-face time of a patient visit (documented in the note above) non-face-to-face time: obtaining and reviewing outside history, ordering and reviewing medications, tests or procedures, care coordination (communications with other health care professionals or caregivers) and documentation in the medical record.

## 2022-03-10 NOTE — Assessment & Plan Note (Signed)
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  She also appears to have had some regional adenopathy including prevascular, AP window and left hilar adenopathy.  She has multiple intracranial metastatic lesions which are likely secondary to metastatic lung cancer given presence of regional adenopathy.  She is now status post SRS to the brain lesions. She started concurrent chemoradiation with CarboTaxol on August 30, 2021.  She completed chemoradiation on October 15, 2021.  She is now on single agent Keytruda every 3 weeks.   End of treatment PET/CT reviewed consistent with response in the lung, likely postradiation changes noted. PET also commented on decrease in size of the breast mass. She however now appears to have increasing breast mass on exam and by recent imaging Although CT comments on large pleural effusion, clinically and by [name], there is no significant effusion I will order an ECHO to investigate the pericardial effusion, once again, no clinically significant effusion. Will change regimen to weekly carbotaxol with every 21 day [place] for 6 weeks and re image. If no concern for metastatic disease, We can discuss about her in Breast [place] for further recommendations.  Thank you for consulting [name] in the care of this patient.  Please do not hesitate to contact us with any additional questions or concerns.

## 2022-03-11 ENCOUNTER — Telehealth: Payer: Self-pay | Admitting: Hematology and Oncology

## 2022-03-11 ENCOUNTER — Other Ambulatory Visit: Payer: Self-pay | Admitting: *Deleted

## 2022-03-11 ENCOUNTER — Encounter: Payer: Self-pay | Admitting: Hematology and Oncology

## 2022-03-11 DIAGNOSIS — Z853 Personal history of malignant neoplasm of breast: Secondary | ICD-10-CM

## 2022-03-11 NOTE — Progress Notes (Signed)
Pharmacist Chemotherapy Monitoring - Initial Assessment    Anticipated start date: 03/18/22   The following has been reviewed per standard work regarding the patient's treatment regimen: The patient's diagnosis, treatment plan and drug doses, and organ/hematologic function Lab orders and baseline tests specific to treatment regimen  The treatment plan start date, drug sequencing, and pre-medications Prior authorization status  Patient's documented medication list, including drug-drug interaction screen and prescriptions for anti-emetics and supportive care specific to the treatment regimen The drug concentrations, fluid compatibility, administration routes, and timing of the medications to be used The patient's access for treatment and lifetime cumulative dose history, if applicable  The patient's medication allergies and previous infusion related reactions, if applicable   Changes made to treatment plan:  treatment plan date  Follow up needed:  N/A   Judge Stall, [facility], 03/11/2022  2:31 PM

## 2022-03-11 NOTE — Telephone Encounter (Signed)
Left patient a vm regarding upcoming appointments  

## 2022-03-12 LAB — T4: T4, Total: 6.9 ug/dL (ref 4.5–12.0)

## 2022-03-14 ENCOUNTER — Ambulatory Visit (HOSPITAL_COMMUNITY)
Admission: RE | Admit: 2022-03-14 | Discharge: 2022-03-14 | Disposition: A | Discharge status: Home / Self Care | Payer: 59 | Source: Ambulatory Visit | Attending: Hematology and Oncology | Admitting: Hematology and Oncology

## 2022-03-14 DIAGNOSIS — Z0189 Encounter for other specified special examinations: Secondary | ICD-10-CM | POA: Diagnosis not present

## 2022-03-14 DIAGNOSIS — I119 Hypertensive heart disease without heart failure: Secondary | ICD-10-CM | POA: Insufficient documentation

## 2022-03-14 DIAGNOSIS — R9431 Abnormal electrocardiogram [ECG] [EKG]: Secondary | ICD-10-CM | POA: Diagnosis not present

## 2022-03-14 DIAGNOSIS — Z853 Personal history of malignant neoplasm of breast: Secondary | ICD-10-CM | POA: Insufficient documentation

## 2022-03-14 DIAGNOSIS — E119 Type 2 diabetes mellitus without complications: Secondary | ICD-10-CM | POA: Diagnosis not present

## 2022-03-14 DIAGNOSIS — E785 Hyperlipidemia, unspecified: Secondary | ICD-10-CM | POA: Diagnosis not present

## 2022-03-14 DIAGNOSIS — Z8673 Personal history of transient ischemic attack (TIA), and cerebral infarction without residual deficits: Secondary | ICD-10-CM | POA: Insufficient documentation

## 2022-03-14 DIAGNOSIS — I3139 Other pericardial effusion (noninflammatory): Secondary | ICD-10-CM | POA: Insufficient documentation

## 2022-03-14 LAB — ECHOCARDIOGRAM COMPLETE
Area-P 1/2: 5.27 cm2
Calc EF: 65.3 %
S' Lateral: 2.3 cm
Single Plane A2C EF: 63.5 %
Single Plane A4C EF: 69.3 %

## 2022-03-14 NOTE — Progress Notes (Signed)
  Echocardiogram 2D Echocardiogram has been performed.  Jennifer Cowan 03/14/2022, 11:55 AM

## 2022-03-14 NOTE — Progress Notes (Signed)
Can we make an urgent referral to Dr Aundra Dubin or team for moderate pericardial effusion please.

## 2022-03-15 ENCOUNTER — Telehealth: Payer: Self-pay | Admitting: *Deleted

## 2022-03-15 ENCOUNTER — Other Ambulatory Visit: Payer: Self-pay | Admitting: *Deleted

## 2022-03-15 DIAGNOSIS — B351 Tinea unguium: Secondary | ICD-10-CM | POA: Diagnosis not present

## 2022-03-15 DIAGNOSIS — E1142 Type 2 diabetes mellitus with diabetic polyneuropathy: Secondary | ICD-10-CM | POA: Diagnosis not present

## 2022-03-15 DIAGNOSIS — M79676 Pain in unspecified toe(s): Secondary | ICD-10-CM | POA: Diagnosis not present

## 2022-03-15 DIAGNOSIS — C50919 Malignant neoplasm of unspecified site of unspecified female breast: Secondary | ICD-10-CM

## 2022-03-15 DIAGNOSIS — I3139 Other pericardial effusion (noninflammatory): Secondary | ICD-10-CM

## 2022-03-15 DIAGNOSIS — R931 Abnormal findings on diagnostic imaging of heart and coronary circulation: Secondary | ICD-10-CM

## 2022-03-15 NOTE — Telephone Encounter (Signed)
This RN called the Cardiac Clinic at [facility] per MD for ASAP appointment for evaluation due to recent abnormal echo.  Obtained VM for scheduling- this RN left detailed message per above as well as informing them referral has been placed.  This RN requested a call to this RN with verification of appt.

## 2022-03-16 ENCOUNTER — Other Ambulatory Visit: Payer: 59

## 2022-03-16 ENCOUNTER — Ambulatory Visit: Payer: 59

## 2022-03-17 MED FILL — Dexamethasone Sodium Phosphate Inj 100 MG/10ML: INTRAMUSCULAR | Qty: 1 | Status: AC

## 2022-03-17 NOTE — Progress Notes (Signed)
[place]  Telephone:([phone] [phone]) [phone]   Name: Jennifer Cowan Date: 03/17/2022 MRN: [id number]  DOB: 1945/03/23  Patient Care Team: [name], FNP as PCP - General (Family Medicine) Pickenpack-Cousar, Carlena Sax, NP as Nurse Practitioner (Nurse Practitioner)    I connected with [name] on 03/17/22 at 10:00 AM EST by phone and verified that I am speaking with the correct person using two identifiers.   I discussed the limitations, risks, security and privacy concerns of performing an evaluation and management service by telemedicine and the availability of in-person appointments. I also discussed with the patient that there may be a patient responsible charge related to this service. The patient expressed understanding and agreed to proceed.   Other persons participating in the visit and their role in the encounter: n/a   Patient's location: home  Provider's location: [facility]   Chief Complaint: f/u of symptom management   INTERVAL HISTORY: Jennifer Cowan is a 77 y.o. 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 Palliative ask to see for symptom management and goals of care.   SOCIAL HISTORY:     reports that she has never smoked. She has never used smokeless tobacco. She reports that she does not drink alcohol and does not use drugs.  ADVANCE DIRECTIVES:  Patient does not have an advanced directive however is interested in completing.   CODE STATUS:   PAST MEDICAL HISTORY: Past Medical History:  Diagnosis Date   Anemia    Arthritis    Breast cancer ([place])    Cancer ([place]) 1999   Breast Cancer   Family history of breast cancer    Hyperlipidemia    Hypertension    Osteopenia 01/12/2021   Personal history of malignant neoplasm of breast     ALLERGIES:   has No Known Allergies.  MEDICATIONS:  Current Outpatient Medications  Medication Sig Dispense Refill   Accu-Chek Softclix Lancets lancets Use to check blood sugar as directed 4 times a day 100 each 1   acetaminophen (TYLENOL) 500 MG tablet Take 1,000 mg by mouth every 6 (six) hours as needed for moderate pain.     amLODipine (NORVASC) 10 MG tablet TAKE 1 TABLET BY MOUTH DAILY 90 tablet 3   aspirin EC 325 MG tablet Take 1 tablet (325 mg total) by mouth daily. 60 tablet 2   atorvastatin (LIPITOR) 80 MG tablet Take 1 tablet (80 mg total) by mouth daily. 90 tablet 3   benazepril (LOTENSIN) 10 MG tablet TAKE 1 TABLET BY MOUTH DAILY 90 tablet 3   blood glucose meter kit and supplies KIT Dispense based on patient and insurance preference. Use up to four times daily as directed. 1 each 0   Blood Glucose Monitoring Suppl (ACCU-CHEK GUIDE) w/Device KIT Use as directed 4 times a day 1 kit 0   Cholecalciferol (VITAMIN D3) 125 MCG (5000 UT) CAPS Take 5,000 Units by mouth daily.     dexamethasone (DECADRON) 4 MG tablet Take 1 tablet (4 mg total) by mouth 3 (three) times daily. 90 tablet 1   ezetimibe (ZETIA) 10 MG tablet Take 1 tablet (10 mg total) by mouth daily. 90 tablet 3   glucose 4 GM chewable tablet Chew 1 tablet (4 g total) by mouth as needed for low blood sugar. 50 tablet 0   glucose blood (ACCU-CHEK GUIDE) test strip  Use as directed 4 times a day 100 each 1   insulin glargine (LANTUS SOLOSTAR) 100 UNIT/ML Solostar Pen Inject 8 Units into the skin at bedtime. 15 mL 1   Insulin Pen Needle 31G X 5 MM MISC 1 each by Does not apply route daily. 90 each 1   insulin regular (NOVOLIN R) 100 units/mL injection If blood sugar is 200 or less: 0 units 201-250: 2 units 251-300: 4 units 301-350: 6 units 351-400: 8 units 401 or higher: 10 units and call MD 10 mL 1   Insulin Syringe-Needle U-100 (INSULIN SYRINGE .3CC/29GX1") 29G X 1" 0.3 ML MISC 1 Dose by Does not apply route 3 (three) times daily before meals. 90  each 1   lidocaine (LIDODERM) 5 % Place 1 patch onto the skin daily. Remove & Discard patch within 12 hours or as directed by MD 30 patch 1   LORazepam (ATIVAN) 0.5 MG tablet 1 tab po 30 minutes prior to radiation or MRI scans 30 tablet 0   meloxicam (MOBIC) 7.5 MG tablet TAKE 1 TABLET BY MOUTH DAILY 90 tablet 3   potassium chloride (MICRO-K) 10 MEQ CR capsule Take 2 capsules (20 mEq total) by mouth daily. 60 capsule 1   prochlorperazine (COMPAZINE) 10 MG tablet Take 1 tablet (10 mg total) by mouth every 6 (six) hours as needed for nausea or vomiting. 30 tablet 1   sucralfate (CARAFATE) 1 g tablet Take 1 tablet (1 g total) by mouth 4 (four) times daily -  with meals and at bedtime. Crush 1 tablet in 1 oz water and drink 5 min before meals for radiation induced esophagitis 120 tablet 2   trolamine salicylate (ASPERCREME) 10 % cream Apply 1 Application topically daily as needed for muscle pain.     valACYclovir (VALTREX) 500 MG tablet Take 1 tablet (500 mg total) by mouth 2 (two) times daily. 20 tablet 0   Current Facility-Administered Medications  Medication Dose Route Frequency Provider Last Rate Last Admin   heparin lock flush 100 unit/mL  500 Units Intravenous Once Kyung Rudd, MD       sodium chloride flush (NS) 0.9 % injection 10 mL  10 mL Intravenous Once Kyung Rudd, MD       Facility-Administered Medications Ordered in Other Visits  Medication Dose Route Frequency Provider Last Rate Last Admin   acetaminophen (TYLENOL) 325 MG tablet            cyanocobalamin (VITAMIN B12) 1000 MCG/ML injection            diphenhydrAMINE (BENADRYL) 25 mg capsule            gadopiclenol (VUEWAY) 0.5 MMOL/ML solution 7.5 mL  7.5 mL Intravenous Once PRN Tyler Pita, MD        VITAL SIGNS: There were no vitals taken for this visit. There were no vitals filed for this visit.   Estimated body mass index is 26 kg/m as calculated from the following:   Height as of 03/10/22: '5\' 8"'$  (1.727 m).   Weight  as of 03/10/22: 171 lb (77.6 kg).   PERFORMANCE STATUS (ECOG) : 2 - Symptomatic, <50% confined to bed   Physical Exam General: NAD, sitting in recliner  Cardiovascular: regular rate and rhythm Pulmonary: normal breathing pattern  Extremities: no edema, no joint deformities Skin: no rashes Neurological: AAO x3, mood appropriate   IMPRESSION: I saw Jennifer Cowan during her infusion.  No acute distress noted.  Patient reports she continues to do well overall.  Denies nausea, vomiting, constipation, or diarrhea.  She reports minimal pain and when she does have discomfort this is controlled by as needed medications.  Reports her appetite is doing great.  Jennifer Cowan states she is eating everything she can think of which she is appreciative of.  Her current weight is 172 pounds up from 162 pounds on 1/10.  Continues to take daily stool softener to prevent constipation.  Patient and family aware we are available as needed for ongoing support and symptom management.   I discussed the importance of continued conversation with family and their medical providers regarding overall plan of care and treatment options, ensuring decisions are within the context of the patients values and GOCs.  PLAN: Ongoing goals of care and support. Continues to improve.  Lidocaine patch as needed No symptom needs at this time.  Will plan to follow-up in 6-8 weeks in collaboration with other oncology appointments.    Patient expressed understanding and was in agreement with this plan. She also understands that She can call the clinic at any time with any questions, concerns, or complaints.     Time Total: 20 min   Visit consisted of counseling and education dealing with the complex and emotionally intense issues of symptom management and palliative care in the setting of serious and potentially life-threatening illness.Greater than 50%  of this time was spent counseling and coordinating care related to the above assessment  and plan.  Alda Lea, AGPCNP-BC  Palliative Medicine Team/[place]

## 2022-03-18 ENCOUNTER — Inpatient Hospital Stay: Payer: 59 | Attending: Hematology and Oncology | Admitting: Nurse Practitioner

## 2022-03-18 ENCOUNTER — Inpatient Hospital Stay: Payer: 59

## 2022-03-18 ENCOUNTER — Encounter: Payer: Self-pay | Admitting: Nurse Practitioner

## 2022-03-18 ENCOUNTER — Other Ambulatory Visit: Payer: Self-pay

## 2022-03-18 VITALS — BP 150/71 | HR 100 | Temp 98.3°F | Resp 16 | Wt 172.5 lb

## 2022-03-18 DIAGNOSIS — C7931 Secondary malignant neoplasm of brain: Secondary | ICD-10-CM | POA: Diagnosis not present

## 2022-03-18 DIAGNOSIS — Z5111 Encounter for antineoplastic chemotherapy: Secondary | ICD-10-CM | POA: Insufficient documentation

## 2022-03-18 DIAGNOSIS — K5900 Constipation, unspecified: Secondary | ICD-10-CM | POA: Diagnosis not present

## 2022-03-18 DIAGNOSIS — R530 Neoplastic (malignant) related fatigue: Secondary | ICD-10-CM

## 2022-03-18 DIAGNOSIS — Z95828 Presence of other vascular implants and grafts: Secondary | ICD-10-CM

## 2022-03-18 DIAGNOSIS — Z515 Encounter for palliative care: Secondary | ICD-10-CM | POA: Diagnosis not present

## 2022-03-18 DIAGNOSIS — C7800 Secondary malignant neoplasm of unspecified lung: Secondary | ICD-10-CM | POA: Diagnosis not present

## 2022-03-18 DIAGNOSIS — Z79899 Other long term (current) drug therapy: Secondary | ICD-10-CM | POA: Insufficient documentation

## 2022-03-18 DIAGNOSIS — N6310 Unspecified lump in the right breast, unspecified quadrant: Secondary | ICD-10-CM | POA: Diagnosis not present

## 2022-03-18 DIAGNOSIS — R739 Hyperglycemia, unspecified: Secondary | ICD-10-CM

## 2022-03-18 DIAGNOSIS — C3490 Malignant neoplasm of unspecified part of unspecified bronchus or lung: Secondary | ICD-10-CM

## 2022-03-18 DIAGNOSIS — C3412 Malignant neoplasm of upper lobe, left bronchus or lung: Secondary | ICD-10-CM | POA: Diagnosis not present

## 2022-03-18 LAB — CMP (CANCER CENTER ONLY)
ALT: 18 U/L (ref 0–44)
AST: 24 U/L (ref 15–41)
Albumin: 3.8 g/dL (ref 3.5–5.0)
Alkaline Phosphatase: 81 U/L (ref 38–126)
Anion gap: 8 (ref 5–15)
BUN: 13 mg/dL (ref 8–23)
CO2: 24 mmol/L (ref 22–32)
Calcium: 9.4 mg/dL (ref 8.9–10.3)
Chloride: 107 mmol/L (ref 98–111)
Creatinine: 0.61 mg/dL (ref 0.44–1.00)
GFR, Estimated: >60 mL/min (ref >60)
Glucose, Bld: 159 mg/dL — ABNORMAL HIGH (ref 70–99)
Potassium: 3.4 mmol/L — ABNORMAL LOW (ref 3.5–5.1)
Sodium: 139 mmol/L (ref 135–145)
Total Bilirubin: 0.7 mg/dL (ref 0.3–1.2)
Total Protein: 7.1 g/dL (ref 6.5–8.1)

## 2022-03-18 LAB — CBC WITH DIFFERENTIAL (CANCER CENTER ONLY)
Abs Immature Granulocytes: 0.01 10*3/uL (ref 0.00–0.07)
Basophils Absolute: 0 10*3/uL (ref 0.0–0.1)
Basophils Relative: 1 %
Eosinophils Absolute: 0.1 10*3/uL (ref 0.0–0.5)
Eosinophils Relative: 2 %
HCT: 39.7 % (ref 36.0–46.0)
Hemoglobin: 13.1 g/dL (ref 12.0–15.0)
Immature Granulocytes: 0 %
Lymphocytes Relative: 14 %
Lymphs Abs: 0.7 10*3/uL (ref 0.7–4.0)
MCH: 29.9 pg (ref 26.0–34.0)
MCHC: 33 g/dL (ref 30.0–36.0)
MCV: 90.6 fL (ref 80.0–100.0)
Monocytes Absolute: 0.7 10*3/uL (ref 0.1–1.0)
Monocytes Relative: 13 %
Neutro Abs: 3.7 10*3/uL (ref 1.7–7.7)
Neutrophils Relative %: 70 %
Platelet Count: 394 10*3/uL (ref 150–400)
RBC: 4.38 MIL/uL (ref 3.87–5.11)
RDW: 14.6 % (ref 11.5–15.5)
WBC Count: 5.2 10*3/uL (ref 4.0–10.5)
nRBC: 0 % (ref 0.0–0.2)

## 2022-03-18 LAB — TSH: TSH: 2.452 u[IU]/mL (ref 0.350–4.500)

## 2022-03-18 MED ORDER — SODIUM CHLORIDE 0.9 % IV SOLN
10.0000 mg | Freq: Once | INTRAVENOUS | Status: AC
  Administered 2022-03-18: 10 mg via INTRAVENOUS
  Filled 2022-03-18: qty 10

## 2022-03-18 MED ORDER — SODIUM CHLORIDE 0.9% FLUSH
10.0000 mL | INTRAVENOUS | Status: DC | PRN
  Administered 2022-03-18: 10 mL

## 2022-03-18 MED ORDER — DIPHENHYDRAMINE HCL 50 MG/ML IJ SOLN
50.0000 mg | Freq: Once | INTRAMUSCULAR | Status: AC
  Administered 2022-03-18: 50 mg via INTRAVENOUS
  Filled 2022-03-18: qty 1

## 2022-03-18 MED ORDER — SODIUM CHLORIDE 0.9% FLUSH
10.0000 mL | Freq: Once | INTRAVENOUS | Status: AC
  Administered 2022-03-18: 10 mL

## 2022-03-18 MED ORDER — SODIUM CHLORIDE 0.9 % IV SOLN
200.0000 mg | Freq: Once | INTRAVENOUS | Status: AC
  Administered 2022-03-18: 200 mg via INTRAVENOUS
  Filled 2022-03-18: qty 8

## 2022-03-18 MED ORDER — FAMOTIDINE IN NACL 20-0.9 MG/50ML-% IV SOLN
20.0000 mg | Freq: Once | INTRAVENOUS | Status: AC
  Administered 2022-03-18: 20 mg via INTRAVENOUS
  Filled 2022-03-18: qty 50

## 2022-03-18 MED ORDER — SODIUM CHLORIDE 0.9 % IV SOLN
45.0000 mg/m2 | Freq: Once | INTRAVENOUS | Status: AC
  Administered 2022-03-18: 84 mg via INTRAVENOUS
  Filled 2022-03-18: qty 14

## 2022-03-18 MED ORDER — SODIUM CHLORIDE 0.9 % IV SOLN: Freq: Once | INTRAVENOUS | Status: AC

## 2022-03-18 MED ORDER — HEPARIN SOD (PORK) LOCK FLUSH 100 UNIT/ML IV SOLN
500.0000 [IU] | Freq: Once | INTRAVENOUS | Status: AC | PRN
  Administered 2022-03-18: 500 [IU]

## 2022-03-18 MED ORDER — PALONOSETRON HCL INJECTION 0.25 MG/5ML
0.2500 mg | Freq: Once | INTRAVENOUS | Status: AC
  Administered 2022-03-18: 0.25 mg via INTRAVENOUS
  Filled 2022-03-18: qty 5

## 2022-03-18 MED ORDER — SODIUM CHLORIDE 0.9 % IV SOLN
167.2000 mg | Freq: Once | INTRAVENOUS | Status: AC
  Administered 2022-03-18: 170 mg via INTRAVENOUS
  Filled 2022-03-18: qty 17

## 2022-03-18 NOTE — Progress Notes (Signed)
Pembrolizumab diluted in NS 75m, Lot: [id number] Exp: 07/17/2023  PAcquanetta Belling RPH, BCPS, BCOP 03/18/2022 2:45 PM

## 2022-03-18 NOTE — Patient Instructions (Signed)
[facility]  Discharge Instructions: Thank you for choosing [facility] to provide your oncology and hematology care.   If you have a lab appointment with the [facility], please go directly to the [facility] and check in at the registration area.   Wear comfortable clothing and clothing appropriate for easy access to any Portacath or PICC line.   We strive to give you quality time with your provider. You may need to reschedule your appointment if you arrive late (15 or more minutes).  Arriving late affects you and other patients whose appointments are after yours.  Also, if you miss three or more appointments without notifying the office, you may be dismissed from the clinic at the provider's discretion.      For prescription refill requests, have your pharmacy contact our office and allow 72 hours for refills to be completed.    Today you received the following chemotherapy and/or immunotherapy agents: pembrolizumab, paclitaxel and carboplatin      To help prevent nausea and vomiting after your treatment, we encourage you to take your nausea medication as directed.  BELOW ARE SYMPTOMS THAT SHOULD BE REPORTED IMMEDIATELY: *FEVER GREATER THAN 100.4 F (38 C) OR HIGHER *CHILLS OR SWEATING *NAUSEA AND VOMITING THAT IS NOT CONTROLLED WITH YOUR NAUSEA MEDICATION *UNUSUAL SHORTNESS OF BREATH *UNUSUAL BRUISING OR BLEEDING *URINARY PROBLEMS (pain or burning when urinating, or frequent urination) *BOWEL PROBLEMS (unusual diarrhea, constipation, pain near the anus) TENDERNESS IN MOUTH AND THROAT WITH OR WITHOUT PRESENCE OF ULCERS (sore throat, sores in mouth, or a toothache) UNUSUAL RASH, SWELLING OR PAIN  UNUSUAL VAGINAL DISCHARGE OR ITCHING   Items with * indicate a potential emergency and should be followed up as soon as possible or go to the Emergency Department if any problems should occur.  Please show the CHEMOTHERAPY ALERT CARD or  IMMUNOTHERAPY ALERT CARD at check-in to the Emergency Department and triage nurse.  Should you have questions after your visit or need to cancel or reschedule your appointment, please contact [facility]  Dept: [phone]  and follow the prompts.  Office hours are 8:00 a.m. to 4:30 p.m. Monday - Friday. Please note that voicemails left after 4:00 p.m. may not be returned until the following business day.  We are closed weekends and major holidays. You have access to a nurse at all times for urgent questions. Please call the main number to the clinic Dept: [phone] and follow the prompts.   For any non-urgent questions, you may also contact your provider using MyChart. We now offer e-Visits for anyone 35 and older to request care online for non-urgent symptoms. For details visit [email or web address].   Also download the MyChart app! Go to the app store, search "MyChart", open the app, select [facility], and log in with your MyChart username and password.

## 2022-03-19 LAB — T4: T4, Total: 6.7 ug/dL (ref 4.5–12.0)

## 2022-03-21 ENCOUNTER — Ambulatory Visit (HOSPITAL_COMMUNITY)
Admission: RE | Admit: 2022-03-21 | Discharge: 2022-03-21 | Disposition: A | Discharge status: Home / Self Care | Payer: 59 | Source: Ambulatory Visit | Attending: Internal Medicine | Admitting: Internal Medicine

## 2022-03-21 ENCOUNTER — Encounter (HOSPITAL_COMMUNITY): Payer: Self-pay | Admitting: Internal Medicine

## 2022-03-21 ENCOUNTER — Other Ambulatory Visit (HOSPITAL_COMMUNITY): Payer: 59

## 2022-03-21 VITALS — BP 130/54 | HR 100 | Wt 170.6 lb

## 2022-03-21 DIAGNOSIS — C7931 Secondary malignant neoplasm of brain: Secondary | ICD-10-CM | POA: Insufficient documentation

## 2022-03-21 DIAGNOSIS — E8770 Fluid overload, unspecified: Secondary | ICD-10-CM | POA: Diagnosis not present

## 2022-03-21 DIAGNOSIS — I10 Essential (primary) hypertension: Secondary | ICD-10-CM | POA: Diagnosis not present

## 2022-03-21 DIAGNOSIS — Z853 Personal history of malignant neoplasm of breast: Secondary | ICD-10-CM | POA: Insufficient documentation

## 2022-03-21 DIAGNOSIS — C3490 Malignant neoplasm of unspecified part of unspecified bronchus or lung: Secondary | ICD-10-CM

## 2022-03-21 DIAGNOSIS — C50911 Malignant neoplasm of unspecified site of right female breast: Secondary | ICD-10-CM | POA: Insufficient documentation

## 2022-03-21 DIAGNOSIS — Z803 Family history of malignant neoplasm of breast: Secondary | ICD-10-CM | POA: Diagnosis not present

## 2022-03-21 DIAGNOSIS — I3139 Other pericardial effusion (noninflammatory): Secondary | ICD-10-CM | POA: Insufficient documentation

## 2022-03-21 MED ORDER — SPIRONOLACTONE 25 MG PO TABS: 12.5000 mg | ORAL_TABLET | Freq: Every day | ORAL | 3 refills | Status: DC

## 2022-03-21 NOTE — Progress Notes (Signed)
CARDIO-ONCOLOGY CLINIC CONSULT NOTE  Referring Physician: Dr. Chryl Heck Primary Care: [name], FNP Primary Cardiologist: New  HPI:  Jennifer Cowan is a 77 y.o. 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 Chryl Heck for further evaluation of her pericardial effusion.   Recent history is complicated.   In 7/23 diagnosed with right breast CA and left lung CA with mets to brain. Treated with Carboplatnin/Paclitaxel and Lung XRT. Also had SRS to brain lesions In 10/23 Keytruda added for treatment of lung CA.    CT 7/23 no pericardial effusion  Chest CT in 2/24 with left upper lung dense consolidation or atelectasis with nonvisualization of left upper lobe mass observed on the prior study, new large layering left-sided pleural effusion and moderate pericardial effusion, resolution of mediastinal and left axillary adenopathy consistent with therapy response, right breast mass demonstrating a partial response with decrease in size   On 03/16/22 started on breast chemo with Carboplatin (2) D1,8,15 + Paclitaxel (45) D1,8,15 q21d X 2 cycles     Echo 03/14/22: EF 65-70% small to moderate pericardial effusion mostly over RV  No tamponade  Referred for tap of left lung effusion but no significant fluid on u/s.   Here with her niece. Denies h/o heart disease. No SOB, orthopnea or PND. Recently with some swelling in her legs.    Review of Systems: [y] = yes, '[ ]'$  = no   General: Weight gain '[ ]'$ ; Weight loss '[ ]'$ ; Anorexia '[ ]'$ ; Fatigue Blue.Reese ]; Fever '[ ]'$ ; Chills '[ ]'$ ; Weakness '[ ]'$   Cardiac: Chest pain/pressure '[ ]'$ ; Resting SOB '[ ]'$ ; Exertional SOB '[ ]'$ ; Orthopnea '[ ]'$ ; Pedal Edema [y]; Palpitations '[ ]'$ ; Syncope '[ ]'$ ; Presyncope '[ ]'$ ; Paroxysmal nocturnal dyspnea'[ ]'$   Pulmonary: Cough '[ ]'$ ; Wheezing'[ ]'$ ; Hemoptysis'[ ]'$ ; Sputum '[ ]'$ ; Snoring '[ ]'$   GI: Vomiting'[ ]'$ ; Dysphagia'[ ]'$ ; Melena'[ ]'$ ; Hematochezia '[ ]'$ ; Heartburn'[ ]'$ ; Abdominal pain '[ ]'$ ; Constipation '[ ]'$ ; Diarrhea '[ ]'$ ; BRBPR '[ ]'$    GU: Hematuria'[ ]'$ ; Dysuria '[ ]'$ ; Nocturia'[ ]'$   Vascular: Pain in legs with walking '[ ]'$ ; Pain in feet with lying flat '[ ]'$ ; Non-healing sores '[ ]'$ ; Stroke '[ ]'$ ; TIA '[ ]'$ ; Slurred speech '[ ]'$ ;  Neuro: Headaches'[ ]'$ ; Vertigo'[ ]'$ ; Seizures'[ ]'$ ; Paresthesias'[ ]'$ ;Blurred vision '[ ]'$ ; Diplopia '[ ]'$ ; Vision changes '[ ]'$   Ortho/Skin: Arthritis '[ ]'$ y; Joint pain Blue.Reese ]; Muscle pain '[ ]'$ ; Joint swelling '[ ]'$ ; Back Pain '[ ]'$ ; Rash '[ ]'$   Psych: Depression'[ ]'$ ; Anxiety'[ ]'$   Heme: Bleeding problems '[ ]'$ ; Clotting disorders '[ ]'$ ; Anemia [ y]  Endocrine: Diabetes '[ ]'$ ; Thyroid dysfunction'[ ]'$    Past Medical History:  Diagnosis Date   Anemia    Arthritis    Breast cancer ([place])    Cancer ([place]) 1999   Breast Cancer   Family history of breast cancer    Hyperlipidemia    Hypertension    Osteopenia 01/12/2021   Personal history of malignant neoplasm of breast     Current Outpatient Medications  Medication Sig Dispense Refill   Accu-Chek Softclix Lancets lancets Use to check blood sugar as directed 4 times a day 100 each 1   acetaminophen (TYLENOL) 500 MG tablet Take 1,000 mg by mouth every 6 (six) hours as needed for moderate pain.     amLODipine (NORVASC) 10 MG tablet TAKE 1 TABLET BY MOUTH DAILY 90 tablet 3   aspirin EC 325 MG tablet Take 1 tablet (  325 mg total) by mouth daily. 60 tablet 2   atorvastatin (LIPITOR) 80 MG tablet Take 1 tablet (80 mg total) by mouth daily. 90 tablet 3   benazepril (LOTENSIN) 10 MG tablet TAKE 1 TABLET BY MOUTH DAILY 90 tablet 3   blood glucose meter kit and supplies KIT Dispense based on patient and insurance preference. Use up to four times daily as directed. 1 each 0   Blood Glucose Monitoring Suppl (ACCU-CHEK GUIDE) w/Device KIT Use as directed 4 times a day 1 kit 0   Cholecalciferol (VITAMIN D3) 125 MCG (5000 UT) CAPS Take 5,000 Units by mouth daily.     glucose blood (ACCU-CHEK GUIDE) test strip Use as directed 4 times a day 100 each 1   ketoconazole (NIZORAL) 2 % cream Apply 1 Application  topically 2 (two) times daily.     lidocaine (LIDODERM) 5 % Place 1 patch onto the skin daily. Remove & Discard patch within 12 hours or as directed by MD 30 patch 1   LORazepam (ATIVAN) 0.5 MG tablet 1 tab po 30 minutes prior to radiation or MRI scans 30 tablet 0   meloxicam (MOBIC) 7.5 MG tablet TAKE 1 TABLET BY MOUTH DAILY 90 tablet 3   trolamine salicylate (ASPERCREME) 10 % cream Apply 1 Application topically daily as needed for muscle pain.     Current Facility-Administered Medications  Medication Dose Route Frequency Provider Last Rate Last Admin   heparin lock flush 100 unit/mL  500 Units Intravenous Once Kyung Rudd, MD       sodium chloride flush (NS) 0.9 % injection 10 mL  10 mL Intravenous Once Kyung Rudd, MD       Facility-Administered Medications Ordered in Other Encounters  Medication Dose Route Frequency Provider Last Rate Last Admin   acetaminophen (TYLENOL) 325 MG tablet            cyanocobalamin (VITAMIN B12) 1000 MCG/ML injection            diphenhydrAMINE (BENADRYL) 25 mg capsule            gadopiclenol (VUEWAY) 0.5 MMOL/ML solution 7.5 mL  7.5 mL Intravenous Once PRN Tyler Pita, MD        No Known Allergies    Social History   Socioeconomic History   Marital status: Single    Spouse name: Not on file   Number of children: 0   Years of education: 12   Highest education level: High school graduate  Occupational History   Occupation: retired  Tobacco Use   Smoking status: Never   Smokeless tobacco: Never  Vaping Use   Vaping Use: Never used  Substance and Sexual Activity   Alcohol use: No   Drug use: Never   Sexual activity: Yes    Birth control/protection: Post-menopausal  Other Topics Concern   Not on file  Social History Narrative   Not on file   Social Determinants of Health   Financial Resource Strain: Not on file  Food Insecurity: Not on file  Transportation Needs: Not on file  Physical Activity: Not on file  Stress: Not on file   Social Connections: Not on file  Intimate Partner Violence: Not on file      Family History  Problem Relation Age of Onset   Arthritis Mother    Breast cancer Sister 48   Diabetes Sister    Hyperlipidemia Sister    Hypertension Sister    Stroke Sister    Dementia Sister    Diabetes  Brother    Hypertension Brother    Breast cancer Niece 39    Vitals:   03/21/22 0917  BP: (!) 130/54  Pulse: 100  SpO2: 97%  Weight: 77.4 kg (170 lb 9.6 oz)    PHYSICAL EXAM: General:  Elderly. No respiratory difficulty HEENT: normal Neck: supple. JVP 7. Carotids 2+ bilat; no bruits. No lymphadenopathy or thryomegaly appreciated. Cor: PMI nondisplaced. Regular rate & rhythm. No rubs, gallops or murmurs. Lungs: clear Abdomen: soft, nontender, nondistended. No hepatosplenomegaly. No bruits or masses. Good bowel sounds. Extremities: no cyanosis, clubbing, rash, 1+ edema Neuro: alert & oriented x 3, cranial nerves grossly intact. moves all 4 extremities w/o difficulty. Affect pleasant.  ECG: NSR 96 No ST-T wave abnormalities. Low volts. No alternans Personally reviewed  ASSESSMENT & PLAN:  1. Pericardial Effusion, small to moderate  - I have reviewed echo images personally. Small to moderate pericardial effusion without tamponade.  There is likely not enough fluid to tap safely at this time - The diagnostic question here is if this is a malignant effusion or related to XRT or inflammation. Given that she already has stage IV cancer likely would not change cancer management - At this time will follow expectantly. Repeat echo in 4 weeks. If expanding will refer for pericardiocentesis or surgical window - we discussed signs/symptoms to watch for  2. Volume overload  - start spiro 12.5   3. HTN - mildly elevated  - start spiro 12.5   4. 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 Right breast CA - treatment as above  Glori Bickers, MD  9:59 AM

## 2022-03-21 NOTE — Patient Instructions (Signed)
No Labs done today.   START Spironolactone 12.'5mg'$  (1/2 tablet) by mouth once daily.   No other medication changes were made. Please continue all current medications as prescribed.  Your physician recommends that you schedule a follow-up appointment in: 4 weeks with an echo prior to your exam.  Your physician has requested that you have an echocardiogram. Echocardiography is a painless test that uses sound waves to create images of your heart. It provides your doctor with information about the size and shape of your heart and how well your heart's chambers and valves are working. This procedure takes approximately one hour. There are no restrictions for this procedure. Please do NOT wear cologne, perfume, aftershave, or lotions (deodorant is allowed). Please arrive 15 minutes prior to your appointment time.  If you have any questions or concerns before your next appointment please send [name] a message through [facility] or call our office at [phone].    TO LEAVE A MESSAGE FOR THE NURSE SELECT OPTION 2, PLEASE LEAVE A MESSAGE INCLUDING: YOUR NAME DATE OF BIRTH CALL BACK NUMBER REASON FOR CALL**this is important as we prioritize the call backs  YOU WILL RECEIVE A CALL BACK THE SAME DAY AS LONG AS YOU CALL BEFORE 4:00 PM   Do the following things EVERYDAY: Weigh yourself in the morning before breakfast. Write it down and keep it in a log. Take your medicines as prescribed Eat low salt foods--Limit salt (sodium) to 2000 mg per day.  Stay as active as you can everyday Limit all fluids for the day to less than 2 liters   At the [facility], you and your health needs are our priority. As part of our continuing mission to provide you with exceptional heart care, we have created designated Provider Care Teams. These Care Teams include your primary Cardiologist (physician) and Advanced Practice Providers (APPs- Physician Assistants and Nurse Practitioners) who all work together  to provide you with the care you need, when you need it.   You may see any of the following providers on your designated Care Team at your next follow up: Dr Glori Bickers Dr Haynes Kerns, NP Lyda Jester, [place] Audry Riles, PharmD   Please be sure to bring in all your medications bottles to every appointment.

## 2022-03-24 MED FILL — Dexamethasone Sodium Phosphate Inj 100 MG/10ML: INTRAMUSCULAR | Qty: 1 | Status: AC

## 2022-03-25 ENCOUNTER — Encounter: Payer: Self-pay | Admitting: Adult Health

## 2022-03-25 ENCOUNTER — Other Ambulatory Visit: Payer: Self-pay

## 2022-03-25 ENCOUNTER — Other Ambulatory Visit (HOSPITAL_COMMUNITY): Payer: Self-pay

## 2022-03-25 ENCOUNTER — Inpatient Hospital Stay (HOSPITAL_BASED_OUTPATIENT_CLINIC_OR_DEPARTMENT_OTHER): Payer: 59 | Admitting: Adult Health

## 2022-03-25 ENCOUNTER — Inpatient Hospital Stay: Payer: 59

## 2022-03-25 VITALS — HR 93

## 2022-03-25 DIAGNOSIS — C3490 Malignant neoplasm of unspecified part of unspecified bronchus or lung: Secondary | ICD-10-CM | POA: Diagnosis not present

## 2022-03-25 DIAGNOSIS — C3412 Malignant neoplasm of upper lobe, left bronchus or lung: Secondary | ICD-10-CM | POA: Diagnosis not present

## 2022-03-25 DIAGNOSIS — C7800 Secondary malignant neoplasm of unspecified lung: Secondary | ICD-10-CM | POA: Diagnosis not present

## 2022-03-25 DIAGNOSIS — Z79899 Other long term (current) drug therapy: Secondary | ICD-10-CM | POA: Diagnosis not present

## 2022-03-25 DIAGNOSIS — C7931 Secondary malignant neoplasm of brain: Secondary | ICD-10-CM | POA: Diagnosis not present

## 2022-03-25 DIAGNOSIS — N6310 Unspecified lump in the right breast, unspecified quadrant: Secondary | ICD-10-CM | POA: Diagnosis not present

## 2022-03-25 DIAGNOSIS — G8929 Other chronic pain: Secondary | ICD-10-CM

## 2022-03-25 DIAGNOSIS — Z5111 Encounter for antineoplastic chemotherapy: Secondary | ICD-10-CM | POA: Diagnosis not present

## 2022-03-25 DIAGNOSIS — Z95828 Presence of other vascular implants and grafts: Secondary | ICD-10-CM

## 2022-03-25 DIAGNOSIS — R739 Hyperglycemia, unspecified: Secondary | ICD-10-CM

## 2022-03-25 LAB — CBC WITH DIFFERENTIAL (CANCER CENTER ONLY)
Abs Immature Granulocytes: 0.03 10*3/uL (ref 0.00–0.07)
Basophils Absolute: 0 10*3/uL (ref 0.0–0.1)
Basophils Relative: 1 %
Eosinophils Absolute: 0.1 10*3/uL (ref 0.0–0.5)
Eosinophils Relative: 3 %
HCT: 38.8 % (ref 36.0–46.0)
Hemoglobin: 12.8 g/dL (ref 12.0–15.0)
Immature Granulocytes: 1 %
Lymphocytes Relative: 11 %
Lymphs Abs: 0.6 10*3/uL — ABNORMAL LOW (ref 0.7–4.0)
MCH: 30.3 pg (ref 26.0–34.0)
MCHC: 33 g/dL (ref 30.0–36.0)
MCV: 91.7 fL (ref 80.0–100.0)
Monocytes Absolute: 0.7 10*3/uL (ref 0.1–1.0)
Monocytes Relative: 13 %
Neutro Abs: 4 10*3/uL (ref 1.7–7.7)
Neutrophils Relative %: 71 %
Platelet Count: 382 10*3/uL (ref 150–400)
RBC: 4.23 MIL/uL (ref 3.87–5.11)
RDW: 14.7 % (ref 11.5–15.5)
WBC Count: 5.5 10*3/uL (ref 4.0–10.5)
nRBC: 0 % (ref 0.0–0.2)

## 2022-03-25 LAB — CMP (CANCER CENTER ONLY)
ALT: 20 U/L (ref 0–44)
AST: 17 U/L (ref 15–41)
Albumin: 3.8 g/dL (ref 3.5–5.0)
Alkaline Phosphatase: 86 U/L (ref 38–126)
Anion gap: 7 (ref 5–15)
BUN: 13 mg/dL (ref 8–23)
CO2: 26 mmol/L (ref 22–32)
Calcium: 9.4 mg/dL (ref 8.9–10.3)
Chloride: 107 mmol/L (ref 98–111)
Creatinine: 0.53 mg/dL (ref 0.44–1.00)
GFR, Estimated: >60 mL/min (ref >60)
Glucose, Bld: 145 mg/dL — ABNORMAL HIGH (ref 70–99)
Potassium: 3.7 mmol/L (ref 3.5–5.1)
Sodium: 140 mmol/L (ref 135–145)
Total Bilirubin: 0.3 mg/dL (ref 0.3–1.2)
Total Protein: 6.6 g/dL (ref 6.5–8.1)

## 2022-03-25 MED ORDER — PALONOSETRON HCL INJECTION 0.25 MG/5ML
0.2500 mg | Freq: Once | INTRAVENOUS | Status: AC
  Administered 2022-03-25: 0.25 mg via INTRAVENOUS
  Filled 2022-03-25: qty 5

## 2022-03-25 MED ORDER — SODIUM CHLORIDE 0.9 % IV SOLN
167.2000 mg | Freq: Once | INTRAVENOUS | Status: AC
  Administered 2022-03-25: 170 mg via INTRAVENOUS
  Filled 2022-03-25: qty 17

## 2022-03-25 MED ORDER — GABAPENTIN 100 MG PO CAPS
ORAL_CAPSULE | ORAL | 0 refills | Status: DC
  Filled 2022-03-25: qty 180, 33d supply, fill #0

## 2022-03-25 MED ORDER — ACETAMINOPHEN 500 MG PO TABS
1000.0000 mg | ORAL_TABLET | Freq: Once | ORAL | Status: AC
  Administered 2022-03-25: 1000 mg via ORAL
  Filled 2022-03-25: qty 2

## 2022-03-25 MED ORDER — SODIUM CHLORIDE 0.9 % IV SOLN
10.0000 mg | Freq: Once | INTRAVENOUS | Status: AC
  Administered 2022-03-25: 10 mg via INTRAVENOUS
  Filled 2022-03-25: qty 10

## 2022-03-25 MED ORDER — DIPHENHYDRAMINE HCL 50 MG/ML IJ SOLN
50.0000 mg | Freq: Once | INTRAMUSCULAR | Status: AC
  Administered 2022-03-25: 50 mg via INTRAVENOUS
  Filled 2022-03-25: qty 1

## 2022-03-25 MED ORDER — SODIUM CHLORIDE 0.9 % IV SOLN: Freq: Once | INTRAVENOUS | Status: AC

## 2022-03-25 MED ORDER — SODIUM CHLORIDE 0.9% FLUSH
10.0000 mL | Freq: Once | INTRAVENOUS | Status: AC
  Administered 2022-03-25: 10 mL

## 2022-03-25 MED ORDER — SODIUM CHLORIDE 0.9 % IV SOLN
45.0000 mg/m2 | Freq: Once | INTRAVENOUS | Status: AC
  Administered 2022-03-25: 84 mg via INTRAVENOUS
  Filled 2022-03-25: qty 14

## 2022-03-25 MED ORDER — HEPARIN SOD (PORK) LOCK FLUSH 100 UNIT/ML IV SOLN
500.0000 [IU] | Freq: Once | INTRAVENOUS | Status: AC | PRN
  Administered 2022-03-25: 500 [IU]

## 2022-03-25 MED ORDER — FAMOTIDINE IN NACL 20-0.9 MG/50ML-% IV SOLN
20.0000 mg | Freq: Once | INTRAVENOUS | Status: AC
  Administered 2022-03-25: 20 mg via INTRAVENOUS
  Filled 2022-03-25: qty 50

## 2022-03-25 MED ORDER — SODIUM CHLORIDE 0.9% FLUSH
10.0000 mL | INTRAVENOUS | Status: DC | PRN
  Administered 2022-03-25: 10 mL

## 2022-03-25 NOTE — Assessment & Plan Note (Signed)
Jennifer Cowan is a 77 year old woman with lung cancer on treatment with pembrolizumab carbo and Taxol.  She receives pembrolizumab [place] and Taxol on day 1, carbo Taxol alone on days 8 and 15 of a 21-day cycle.  Jennifer Cowan has no signs of cancer progression and is tolerating treatment well.  She has no peripheral neuropathy and we are monitoring her closely for this.  She is struggling with postherpetic neuralgia so I prescribed gabapentin with a taper up and I reviewed this with her and her niece in detail, wrote down the instructions and gave them to her in her after visit summary.  They know to call if she has any problems with tolerating this.  We will see her back in 1 week for labs, follow-up, and day 15 of her chemotherapy regimen.

## 2022-03-25 NOTE — Progress Notes (Signed)
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  The report date is February 27, 2022.  The CancerNext-Expanded gene panel offered by [facility] and includes sequencing and rearrangement analysis for the following 77 genes: AIP, ALK, APC*, ATM*, AXIN2, BAP1, BARD1, BLM, BMPR1A, BRCA1*, BRCA2*, BRIP1*, CDC73, CDH1*, CDK4, CDKN1B, CDKN2A, CHEK2*, CTNNA1, DICER1, FANCC, FH, FLCN, GALNT12, KIF1B, LZTR1, MAX, MEN1, MET, MLH1*, MSH2*, MSH3, MSH6*, MUTYH*, NBN, NF1*, NF2, NTHL1, PALB2*, PHOX2B, PMS2*, POT1, PRKAR1A, PTCH1, PTEN*, RAD51C*, RAD51D*, RB1, RECQL, RET, SDHA, SDHAF2, SDHB, SDHC, SDHD, SMAD4, SMARCA4, SMARCB1, SMARCE1, STK11, SUFU, TMEM127, TP53*, TSC1, TSC2, VHL and XRCC2 (sequencing and deletion/duplication); EGFR, EGLN1, HOXB13, KIT, MITF, PDGFRA, POLD1, and POLE (sequencing only); EPCAM and GREM1 (deletion/duplication only). DNA and RNA analyses performed for * genes.    Malignant neoplasm of unspecified part of unspecified bronchus or lung ([place])  08/13/2021 Initial Diagnosis   Malignant neoplasm of unspecified part of unspecified bronchus or lung ([place])   08/30/2021 - 09/13/2021 Chemotherapy   Patient is on Treatment Plan : LUNG Carboplatin / Paclitaxel + XRT q7d     08/30/2021 - 10/11/2021 Chemotherapy   Patient is on Treatment Plan : LUNG Carboplatin + Paclitaxel + XRT q7d     11/16/2021 - 02/17/2022 Chemotherapy   Patient is on Treatment Plan : LUNG Pembrolizumab Q 21D     03/16/2022 - 03/16/2022 Chemotherapy   Patient is on Treatment Plan : BREAST Pembrolizumab (400) D1 + Carboplatin (1.5) weekly + Paclitaxel (80) weekly q42d X 2 cycles / Pembrolizumab (400) D1 +  AC D1,22 q42d x 2 cycles     03/18/2022 -  Chemotherapy   Patient is on Treatment Plan : BREAST Pembrolizumab (200) D1 + Carboplatin (2) D1,8,15 + Paclitaxel (45) D1,8,15 q21d X 2 cycles       CURRENT THERAPY: Taxol/Carbo  INTERVAL HISTORY: Jennifer Cowan 77 y.o. female returns for follow-up and evaluation prior to receiving cycle 1 day 8 of Taxol carbo.  She is with her niece Geraldo Pitter.  She tolerated week 1 of treatment.  She denies any peripheral neuropathy.  Her biggest issue is postherpetic neuralgia from her shingles that she had a few weeks ago.  Gabapentin was suggested but it was not prescribed and Aishia would really like something to help with the pain.  She also had some lower extremity edema and Dr. Haroldine Laws prescribed spironolactone.  Since she has started taking this she has noticed an improvement in this.  She denies any other issues.   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  MEDICAL HISTORY: Past Medical History:  Diagnosis Date   Anemia    Arthritis    Breast cancer ([place])    Cancer ([place]) 1999    Breast Cancer   Family history of breast cancer    Hyperlipidemia    Hypertension    Osteopenia 01/12/2021   Personal history of malignant neoplasm of breast     SURGICAL HISTORY: Past Surgical History:  Procedure Laterality Date   BREAST SURGERY  1999   Masectomy- left   BRONCHIAL BIOPSY  07/30/2021   Procedure: BRONCHIAL BIOPSIES;  Surgeon: Collene Gobble, MD;  Location[facility] ENDOSCOPY;  Service: Pulmonary;;   BRONCHIAL BRUSHINGS  07/30/2021   Procedure: BRONCHIAL BRUSHINGS;  Surgeon: Collene Gobble, MD;  Location: [facility] ENDOSCOPY;  Service: Pulmonary;;   BRONCHIAL NEEDLE ASPIRATION BIOPSY  07/30/2021   Procedure: BRONCHIAL NEEDLE ASPIRATION BIOPSIES;  Surgeon: Collene Gobble, MD;  Location: [facility] ENDOSCOPY;  Service: Pulmonary;;   CYSTECTOMY     cyst removed off top of head   IR IMAGING GUIDED PORT INSERTION  08/20/2021   MASTECTOMY     VIDEO BRONCHOSCOPY WITH RADIAL ENDOBRONCHIAL ULTRASOUND  07/30/2021   Procedure: VIDEO BRONCHOSCOPY WITH RADIAL ENDOBRONCHIAL ULTRASOUND;  Surgeon: Collene Gobble, MD;  Location: [facility] ENDOSCOPY;  Service: Pulmonary;;    SOCIAL HISTORY: Social History   Socioeconomic History   Marital status: Single    Spouse name: Not on file   Number of children: 0   Years of education: 12   Highest education level: High school graduate  Occupational History   Occupation: retired  Tobacco Use   Smoking status: Never   Smokeless tobacco: Never  Vaping Use   Vaping Use: Never used  Substance and Sexual Activity   Alcohol use: No   Drug use: Never   Sexual activity: Yes    Birth control/protection: Post-menopausal  Other Topics Concern   Not on file  Social History Narrative   Not on file   Social Determinants of Health   Financial Resource Strain: Not on file  Food Insecurity: Not on file  Transportation Needs: Not on file  Physical Activity: Not on file  Stress: Not on file  Social Connections: Not on file  Intimate Partner Violence: Not on file     FAMILY HISTORY: Family History  Problem Relation Age of Onset   Arthritis Mother    Breast cancer Sister 7   Diabetes Sister    Hyperlipidemia Sister    Hypertension Sister    Stroke Sister    Dementia Sister    Diabetes Brother    Hypertension Brother    Breast cancer Niece 42    Review of Systems  Constitutional:  Negative for appetite change, chills, fatigue, fever and unexpected weight change.  HENT:   Negative for hearing loss, lump/mass and trouble swallowing.   Eyes:  Negative for eye problems and icterus.  Respiratory:  Negative for chest tightness, cough and shortness of breath.   Cardiovascular:  Negative for chest pain, leg swelling and palpitations.  Gastrointestinal:  Negative for abdominal distention, abdominal pain, constipation, diarrhea, nausea and vomiting.  Endocrine: Negative for hot flashes.  Genitourinary:  Negative for difficulty urinating.   Musculoskeletal:  Negative for arthralgias.  Skin:  Negative for itching and rash.  Neurological:  Negative for dizziness,  extremity weakness, headaches and numbness.  Hematological:  Negative for adenopathy. Does not bruise/bleed easily.  Psychiatric/Behavioral:  Negative for depression. The patient is not nervous/anxious.       PHYSICAL EXAMINATION  ECOG PERFORMANCE STATUS: 1 - Symptomatic but completely ambulatory  Vitals:   03/25/22 0832  BP: (!) 127/58  Pulse: (!) 102  Resp: 18  Temp: 97.8 F (36.6 C)  SpO2: 99%    Physical Exam Constitutional:      General: She is not in acute distress.    Appearance: Normal appearance. She is not toxic-appearing.  HENT:     Head: Normocephalic and atraumatic.  Eyes:     General: No scleral icterus. Cardiovascular:     Rate and Rhythm: Normal rate and regular rhythm.     Pulses: Normal pulses.     Heart sounds: Normal heart sounds.  Pulmonary:     Effort: Pulmonary effort is normal.     Breath sounds: Normal breath sounds.  Abdominal:     General:  Abdomen is flat. Bowel sounds are normal. There is no distension.     Palpations: Abdomen is soft.     Tenderness: There is no abdominal tenderness.  Musculoskeletal:        General: No swelling.     Cervical back: Neck supple.  Lymphadenopathy:     Cervical: No cervical adenopathy.  Skin:    General: Skin is warm and dry.     Findings: No rash.  Neurological:     General: No focal deficit present.     Mental Status: She is alert.  Psychiatric:        Mood and Affect: Mood normal.        Behavior: Behavior normal.     LABORATORY DATA:  CBC    Component Value Date/Time   WBC 5.5 03/25/2022 0757   WBC 6.7 07/25/2021 0111   RBC 4.23 03/25/2022 0757   HGB 12.8 03/25/2022 0757   HGB 15.7 07/07/2021 1538   HCT 38.8 03/25/2022 0757   HCT 46.6 07/07/2021 1538   PLT 382 03/25/2022 0757   PLT 450 07/07/2021 1538   MCV 91.7 03/25/2022 0757   MCV 88 07/07/2021 1538   MCH 30.3 03/25/2022 0757   MCHC 33.0 03/25/2022 0757   RDW 14.7 03/25/2022 0757   RDW 12.9 07/07/2021 1538   LYMPHSABS 0.6 (L) 03/25/2022 0757   LYMPHSABS 1.6 07/07/2021 1538   MONOABS 0.7 03/25/2022 0757   EOSABS 0.1 03/25/2022 0757   EOSABS 0.1 07/07/2021 1538   BASOSABS 0.0 03/25/2022 0757   BASOSABS 0.0 07/07/2021 1538    CMP     Component Value Date/Time   NA 140 03/25/2022 0757   NA 141 07/07/2021 1538   K 3.7 03/25/2022 0757   CL 107 03/25/2022 0757   CO2 26 03/25/2022 0757   GLUCOSE 145 (H) 03/25/2022 0757   BUN 13 03/25/2022 0757   BUN 9 07/07/2021 1538   CREATININE 0.53 03/25/2022 0757   CALCIUM 9.4 03/25/2022 0757   PROT 6.6 03/25/2022 0757   PROT 7.9 07/07/2021 1538   ALBUMIN 3.8 03/25/2022 0757   ALBUMIN 4.6 07/07/2021 1538   AST 17 03/25/2022 0757   ALT 20 03/25/2022 0757   ALKPHOS 86 03/25/2022 0757   BILITOT 0.3 03/25/2022 0757   GFRNONAA >60 03/25/2022 0757   GFRAA 103 03/05/2020 1119       ASSESSMENT and THERAPY PLAN:   Malignant neoplasm of unspecified part of  unspecified bronchus or lung ([place]) Stanton Kidney  is a 77 year old woman with lung cancer on treatment with pembrolizumab carbo and Taxol.  She receives pembrolizumab [place] and Taxol on day 1, carbo Taxol alone on days 8 and 15 of a 21-day cycle.  Thavy has no signs of cancer progression and is tolerating treatment well.  She has no peripheral neuropathy and we are monitoring her closely for this.  She is struggling with postherpetic neuralgia so I prescribed gabapentin with a taper up and I reviewed this with her and her niece in detail, wrote down the instructions and gave them to her in her after visit summary.  They know to call if she has any problems with tolerating this.  We will see her back in 1 week for labs, follow-up, and day 15 of her chemotherapy regimen.   All questions were answered. The patient knows to call the clinic with any problems, questions or concerns. We can certainly see the patient much sooner if necessary.  Total encounter time:30 minutes*in face-to-face visit time, chart review, lab review, care coordination, order entry, and documentation of the encounter time.    Wilber Bihari, NP 03/25/22 8:49 AM Medical Oncology and Hematology [facility] [place], [place] [address] Tel. [phone]    Fax. [phone]  *Total Encounter Time as defined by the Centers for Medicare and Medicaid Services includes, in addition to the face-to-face time of a patient visit (documented in the note above) non-face-to-face time: obtaining and reviewing outside history, ordering and reviewing medications, tests or procedures, care coordination (communications with other health care professionals or caregivers) and documentation in the medical record.

## 2022-03-25 NOTE — Patient Instructions (Signed)
[facility]  Discharge Instructions: Thank you for choosing [facility] to provide your oncology and hematology care.   If you have a lab appointment with the Cancer Center, please go directly to the Cancer Center and check in at the registration area.   Wear comfortable clothing and clothing appropriate for easy access to any Portacath or PICC line.   We strive to give you quality time with your provider. You may need to reschedule your appointment if you arrive late (15 or more minutes).  Arriving late affects you and other patients whose appointments are after yours.  Also, if you miss three or more appointments without notifying the office, you may be dismissed from the clinic at the provider's discretion.      For prescription refill requests, have your pharmacy contact our office and allow 72 hours for refills to be completed.    Today you received the following chemotherapy and/or immunotherapy agents: Paclitaxel, Carboplatin.       To help prevent nausea and vomiting after your treatment, we encourage you to take your nausea medication as directed.  BELOW ARE SYMPTOMS THAT SHOULD BE REPORTED IMMEDIATELY: *FEVER GREATER THAN 100.4 F (38 C) OR HIGHER *CHILLS OR SWEATING *NAUSEA AND VOMITING THAT IS NOT CONTROLLED WITH YOUR NAUSEA MEDICATION *UNUSUAL SHORTNESS OF BREATH *UNUSUAL BRUISING OR BLEEDING *URINARY PROBLEMS (pain or burning when urinating, or frequent urination) *BOWEL PROBLEMS (unusual diarrhea, constipation, pain near the anus) TENDERNESS IN MOUTH AND THROAT WITH OR WITHOUT PRESENCE OF ULCERS (sore throat, sores in mouth, or a toothache) UNUSUAL RASH, SWELLING OR PAIN  UNUSUAL VAGINAL DISCHARGE OR ITCHING   Items with * indicate a potential emergency and should be followed up as soon as possible or go to the Emergency Department if any problems should occur.  Please show the CHEMOTHERAPY ALERT CARD or IMMUNOTHERAPY  ALERT CARD at check-in to the Emergency Department and triage nurse.  Should you have questions after your visit or need to cancel or reschedule your appointment, please contact [facility]  Dept: [phone]  and follow the prompts.  Office hours are 8:00 a.m. to 4:30 p.m. Monday - Friday. Please note that voicemails left after 4:00 p.m. may not be returned until the following business day.  We are closed weekends and major holidays. You have access to a nurse at all times for urgent questions. Please call the main number to the clinic Dept: [phone] and follow the prompts.   For any non-urgent questions, you may also contact your provider using MyChart. We now offer e-Visits for anyone 18 and older to request care online for non-urgent symptoms. For details visit [email or web address].   Also download the MyChart app! Go to the app store, search "MyChart", open the app, select [facility], and log in with your MyChart username and password.   

## 2022-03-30 NOTE — Assessment & Plan Note (Addendum)
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  She also appears to have had some regional adenopathy including prevascular, AP window and left hilar adenopathy.  She has multiple intracranial metastatic lesions which are likely secondary to metastatic lung cancer given presence of regional adenopathy.   She is now status post SRS to the brain lesions. She started concurrent chemoradiation with CarboTaxol on August 30, 2021.  She completed chemoradiation on October 15, 2021.  She is now on single agent Keytruda every 3 weeks.   End of treatment PET/CT reviewed consistent with response in the lung, likely postradiation changes noted. PET also commented on decrease in size of the breast mass.  We started noticing increasing size of the right breast mass while she was on immunotherapy alone. We have restarted her on weekly CarboTaxol with immunotherapy.  If she does not have any evidence of progression or new disease from both breast cancer and lung cancer standpoint, we may represent her in the breast tumor board for role of surgery. At this time, however we are also investigating pericardial effusion, she is now monitored by Dr Rona Ravens to continue treatment as planned at this time.  Benay Pike MD

## 2022-03-30 NOTE — Progress Notes (Signed)
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  The report date is February 27, 2022.  The CancerNext-Expanded gene panel offered by [facility] and includes sequencing and rearrangement analysis for the following 77 genes: AIP, ALK, APC*, ATM*, AXIN2, BAP1, BARD1, BLM, BMPR1A, BRCA1*, BRCA2*, BRIP1*, CDC73, CDH1*, CDK4, CDKN1B, CDKN2A, CHEK2*, CTNNA1, DICER1, FANCC, FH, FLCN, GALNT12, KIF1B, LZTR1, MAX, MEN1, MET, MLH1*, MSH2*, MSH3, MSH6*, MUTYH*, NBN, NF1*, NF2, NTHL1, PALB2*, PHOX2B, PMS2*, POT1, PRKAR1A, PTCH1, PTEN*, RAD51C*, RAD51D*, RB1, RECQL, RET, SDHA, SDHAF2, SDHB, SDHC, SDHD, SMAD4, SMARCA4, SMARCB1, SMARCE1, STK11, SUFU, TMEM127, TP53*, TSC1, TSC2, VHL and XRCC2 (sequencing and deletion/duplication); EGFR, EGLN1, HOXB13, KIT, MITF, PDGFRA, POLD1, and POLE (sequencing only); EPCAM and GREM1 (deletion/duplication only). DNA and RNA analyses performed for * genes.    Malignant neoplasm of unspecified part of unspecified bronchus or lung ([place])  08/13/2021 Initial Diagnosis   Malignant neoplasm of unspecified part of unspecified bronchus or lung ([place])   08/30/2021 - 09/13/2021 Chemotherapy   Patient is on Treatment Plan : LUNG Carboplatin / Paclitaxel + XRT q7d     08/30/2021 - 10/11/2021 Chemotherapy   Patient is on Treatment Plan : LUNG Carboplatin + Paclitaxel + XRT q7d     11/16/2021 - 02/17/2022 Chemotherapy   Patient is on Treatment Plan : LUNG Pembrolizumab Q 21D     03/16/2022 - 03/16/2022 Chemotherapy   Patient is on Treatment Plan : BREAST Pembrolizumab (400) D1 + Carboplatin (1.5) weekly + Paclitaxel (80) weekly q42d X 2  cycles / Pembrolizumab (400) D1 + AC D1,22 q42d x 2 cycles     03/18/2022 -  Chemotherapy   Patient is on Treatment Plan : BREAST Pembrolizumab (200) D1 + Carboplatin (2) D1,8,15 + Paclitaxel (45) D1,8,15 q21d X 2 cycles       CURRENT THERAPY: Pembrolizumab  INTERVAL HISTORY:  Jennifer Cowan 77 y.o. female returns for follow-up after undergoing CT chest on February 24, 2022.  This scan demonstrated a left upper lung dense consolidation or atelectasis with nonvisualization of left upper lobe mass observed on the prior study, new large layering left-sided pleural effusion and moderate pericardial effusion, resolution of mediastinal and left axillary adenopathy consistent with therapy response, right breast mass demonstrating a partial response with decrease in size, and abnormalities in the bilateral partially imaged kidneys.  Numerous hepatic cysts.    She also underwent bilateral breast mammogram and ultrasound on February 26, 2022.  This demonstrated that the mass was 6 x 4.8 x 5.2 cm and previously was 5.9 x 3.6 x 4.5 cm.  There is more bulk noted to the deep aspect of the mass along with new skin thickening along the anterior and medial aspect of the right breast with trabecular thickening throughout the medial and inferior right breast.  There was no suspicious right axillary lymphadenopathy noted.  Since her last imaging showed increasing bulk in the right breast mass, we have recommended going back on weekly CarboTaxol along with every 21-day Keytruda.  If her next set of imaging does not show progression or new evidence of disease, we may be able to consult breast surgery to see if she will be a candidate.  Interval History  She says she is feeling pain. She only complains of back pain which she has had from shingles. She is now on gabapentin. According to her niece, she hasn't complained of much pain in the past few days. She is eating well. She has been tolerating chemo really well. She saw  Dr Gala Romney, plan is to repeat echo and consider intervention if the effusion changes. No other concerns today.  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   Type 2 diabetes mellitus with hyperglycemia, without long-term current use of insulin (HCC) 10/06/2021   Port-A-Cath in place 08/30/2021   Hyperglycemia 08/30/2021   Non-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) 08/13/2021   Malignant neoplasm of unspecified part of unspecified bronchus or lung (HCC) 08/13/2021   Physical deconditioning 07/27/2021   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   Mass of upper lobe of left lung 07/25/2021   Metastasis to brain (HCC) 07/24/2021   Overweight 07/07/2021   Osteopenia 01/12/2021   History of breast cancer 03/05/2020   Primary hypertension 06/26/2018   Iron deficiency anemia 06/26/2018   Mixed hyperlipidemia 06/26/2018   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  MEDICAL HISTORY: Past Medical History:  Diagnosis Date   Anemia    Arthritis    Breast cancer (HCC)    Cancer (HCC) 1999   Breast Cancer   Family history of breast cancer    Hyperlipidemia    Hypertension    Osteopenia 01/12/2021   Personal history of malignant neoplasm of breast     SURGICAL HISTORY: Past Surgical History:  Procedure Laterality Date   BREAST SURGERY  1999   Masectomy- left   BRONCHIAL BIOPSY  07/30/2021   Procedure: BRONCHIAL BIOPSIES;  Surgeon: Leslye Peer, MD;  Location[facility] ENDOSCOPY;  Service: Pulmonary;;   BRONCHIAL BRUSHINGS  07/30/2021   Procedure: BRONCHIAL BRUSHINGS;  Surgeon: Leslye Peer, MD;  Location[facility] ENDOSCOPY;  Service: Pulmonary;;   BRONCHIAL NEEDLE ASPIRATION BIOPSY  07/30/2021   Procedure: BRONCHIAL NEEDLE ASPIRATION BIOPSIES;  Surgeon: Leslye Peer, MD;  Location: [facility] ENDOSCOPY;  Service: Pulmonary;;    CYSTECTOMY     cyst removed off top of head   IR IMAGING GUIDED PORT INSERTION  08/20/2021   MASTECTOMY     VIDEO BRONCHOSCOPY WITH RADIAL ENDOBRONCHIAL ULTRASOUND  07/30/2021   Procedure: VIDEO BRONCHOSCOPY WITH RADIAL ENDOBRONCHIAL ULTRASOUND;  Surgeon: Leslye Peer, MD;  Location: [facility] ENDOSCOPY;  Service: Pulmonary;;    SOCIAL HISTORY: Social History   Socioeconomic History   Marital status: Single    Spouse name: Not on file   Number of children: 0   Years of education: 12   Highest education level: High school graduate  Occupational History   Occupation: retired  Tobacco Use   Smoking status: Never   Smokeless tobacco: Never  Vaping Use   Vaping Use: Never used  Substance and Sexual Activity   Alcohol use: No   Drug use: Never   Sexual activity: Yes    Birth control/protection: Post-menopausal  Other Topics Concern   Not on file  Social History Narrative   Not on file   Social Determinants of Health   Financial Resource Strain: Not on file  Food Insecurity: Not on file  Transportation Needs: Not on file  Physical Activity: Not on file  Stress: Not on  file  Social Connections: Not on file  Intimate Partner Violence: Not on file    FAMILY HISTORY: Family History  Problem Relation Age of Onset   Arthritis Mother    Breast cancer Sister 29   Diabetes Sister    Hyperlipidemia Sister    Hypertension Sister    Stroke Sister    Dementia Sister    Diabetes Brother    Hypertension Brother    Breast cancer Niece 35    Review of Systems  Constitutional:  Positive for fatigue. Negative for appetite change, chills, fever and unexpected weight change.  HENT:   Negative for hearing loss, lump/mass and trouble swallowing.   Eyes:  Negative for eye problems and icterus.  Respiratory:  Negative for chest tightness, cough and shortness of breath.   Cardiovascular:  Negative for chest pain, leg swelling and palpitations.  Gastrointestinal:  Negative for abdominal  distention, abdominal pain, constipation, diarrhea, nausea and vomiting.  Endocrine: Negative for hot flashes.  Genitourinary:  Negative for difficulty urinating.   Musculoskeletal:  Positive for back pain. Negative for arthralgias.  Skin:  Negative for itching and rash.  Neurological:  Negative for dizziness, extremity weakness, headaches and numbness.  Hematological:  Negative for adenopathy. Does not bruise/bleed easily.  Psychiatric/Behavioral:  Negative for depression. The patient is not nervous/anxious.       PHYSICAL EXAMINATION  ECOG PERFORMANCE STATUS: 1 - Symptomatic but completely ambulatory  Vitals:   04/01/22 0932  BP: (!) 129/53  Pulse: 99  Resp: 14  Temp: (!) 97.3 F (36.3 C)  SpO2: 100%     Physical Exam Constitutional:      General: She is not in acute distress.    Appearance: Normal appearance. She is not toxic-appearing.  HENT:     Head: Normocephalic and atraumatic.  Eyes:     General: No scleral icterus. Cardiovascular:     Rate and Rhythm: Normal rate and regular rhythm.     Pulses: Normal pulses.     Heart sounds: Normal heart sounds.     Comments: Heart sounds clear and loud, no suggestion of large pericardial effusion. Pulmonary:     Effort: Pulmonary effort is normal.     Breath sounds: Normal breath sounds.     Comments: No significant findings suggestive of effusion, if any this must be small Chest:     Comments: Right breast mass stable. No palpable right axillary adenopathy Abdominal:     General: Abdomen is flat. Bowel sounds are normal. There is no distension.     Palpations: Abdomen is soft.     Tenderness: There is no abdominal tenderness.  Musculoskeletal:        General: No swelling.     Cervical back: Neck supple.  Lymphadenopathy:     Cervical: No cervical adenopathy.  Skin:    General: Skin is warm and dry.     Findings: No rash.  Neurological:     General: No focal deficit present.     Mental Status: She is alert.   Psychiatric:        Mood and Affect: Mood normal.        Behavior: Behavior normal.     LABORATORY DATA:  CBC    Component Value Date/Time   WBC 5.5 03/25/2022 0757   WBC 6.7 07/25/2021 0111   RBC 4.23 03/25/2022 0757   HGB 12.8 03/25/2022 0757   HGB 15.7 07/07/2021 1538   HCT 38.8 03/25/2022 0757   HCT 46.6 07/07/2021 1538  PLT 382 03/25/2022 0757   PLT 450 07/07/2021 1538   MCV 91.7 03/25/2022 0757   MCV 88 07/07/2021 1538   MCH 30.3 03/25/2022 0757   MCHC 33.0 03/25/2022 0757   RDW 14.7 03/25/2022 0757   RDW 12.9 07/07/2021 1538   LYMPHSABS 0.6 (L) 03/25/2022 0757   LYMPHSABS 1.6 07/07/2021 1538   MONOABS 0.7 03/25/2022 0757   EOSABS 0.1 03/25/2022 0757   EOSABS 0.1 07/07/2021 1538   BASOSABS 0.0 03/25/2022 0757   BASOSABS 0.0 07/07/2021 1538    CMP     Component Value Date/Time   NA 140 03/25/2022 0757   NA 141 07/07/2021 1538   K 3.7 03/25/2022 0757   CL 107 03/25/2022 0757   CO2 26 03/25/2022 0757   GLUCOSE 145 (H) 03/25/2022 0757   BUN 13 03/25/2022 0757   BUN 9 07/07/2021 1538   CREATININE 0.53 03/25/2022 0757   CALCIUM 9.4 03/25/2022 0757   PROT 6.6 03/25/2022 0757   PROT 7.9 07/07/2021 1538   ALBUMIN 3.8 03/25/2022 0757   ALBUMIN 4.6 07/07/2021 1538   AST 17 03/25/2022 0757   ALT 20 03/25/2022 0757   ALKPHOS 86 03/25/2022 0757   BILITOT 0.3 03/25/2022 0757   GFRNONAA >60 03/25/2022 0757   GFRAA 103 03/05/2020 1119    ASSESSMENT and THERAPY PLAN:   Malignant neoplasm of unspecified part of unspecified bronchus or lung ([place]) This is a 77 year old patient with 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  She also appears to have had some regional adenopathy including prevascular, AP window and left hilar adenopathy.  She has multiple intracranial metastatic lesions which are likely secondary to metastatic lung cancer given presence of regional adenopathy.   She is  now status post SRS to the brain lesions. She started concurrent chemoradiation with CarboTaxol on August 30, 2021.  She completed chemoradiation on October 15, 2021.  She is now on single agent Keytruda every 3 weeks.   End of treatment PET/CT reviewed consistent with response in the lung, likely postradiation changes noted. PET also commented on decrease in size of the breast mass.  We started noticing increasing size of the right breast mass while she was on immunotherapy alone. We have restarted her on weekly CarboTaxol with immunotherapy.  If she does not have any evidence of progression or new disease from both breast cancer and lung cancer standpoint, we may represent her in the breast tumor board for role of surgery. At this time, however we are also investigating pericardial effusion, she is now monitored by Dr Rona Ravens to continue treatment as planned at this time.  Benay Pike MD   Total time: 30 min  *Total Encounter Time as defined by the Centers for Medicare and Medicaid Services includes, in addition to the face-to-face time of a patient visit (documented in the note above) non-face-to-face time: obtaining and reviewing outside history, ordering and reviewing medications, tests or procedures, care coordination (communications with other health care professionals or caregivers) and documentation in the medical record.

## 2022-03-31 ENCOUNTER — Other Ambulatory Visit: Payer: Medicare Other

## 2022-03-31 ENCOUNTER — Ambulatory Visit: Payer: Medicare Other

## 2022-03-31 ENCOUNTER — Ambulatory Visit: Payer: Medicare Other | Admitting: Hematology and Oncology

## 2022-04-01 ENCOUNTER — Inpatient Hospital Stay: Payer: 59

## 2022-04-01 ENCOUNTER — Other Ambulatory Visit: Payer: Self-pay

## 2022-04-01 ENCOUNTER — Encounter: Payer: Self-pay | Admitting: Hematology and Oncology

## 2022-04-01 ENCOUNTER — Inpatient Hospital Stay (HOSPITAL_BASED_OUTPATIENT_CLINIC_OR_DEPARTMENT_OTHER): Payer: 59 | Admitting: Hematology and Oncology

## 2022-04-01 VITALS — BP 129/53 | HR 99 | Temp 97.3°F | Resp 14 | Ht 68.0 in | Wt 168.9 lb

## 2022-04-01 DIAGNOSIS — C7931 Secondary malignant neoplasm of brain: Secondary | ICD-10-CM | POA: Diagnosis not present

## 2022-04-01 DIAGNOSIS — C3490 Malignant neoplasm of unspecified part of unspecified bronchus or lung: Secondary | ICD-10-CM

## 2022-04-01 DIAGNOSIS — R739 Hyperglycemia, unspecified: Secondary | ICD-10-CM

## 2022-04-01 DIAGNOSIS — N6310 Unspecified lump in the right breast, unspecified quadrant: Secondary | ICD-10-CM | POA: Diagnosis not present

## 2022-04-01 DIAGNOSIS — Z95828 Presence of other vascular implants and grafts: Secondary | ICD-10-CM

## 2022-04-01 DIAGNOSIS — R918 Other nonspecific abnormal finding of lung field: Secondary | ICD-10-CM | POA: Diagnosis not present

## 2022-04-01 DIAGNOSIS — G9389 Other specified disorders of brain: Secondary | ICD-10-CM | POA: Diagnosis not present

## 2022-04-01 DIAGNOSIS — C50919 Malignant neoplasm of unspecified site of unspecified female breast: Secondary | ICD-10-CM | POA: Diagnosis not present

## 2022-04-01 DIAGNOSIS — Z5111 Encounter for antineoplastic chemotherapy: Secondary | ICD-10-CM | POA: Diagnosis not present

## 2022-04-01 DIAGNOSIS — Z79899 Other long term (current) drug therapy: Secondary | ICD-10-CM | POA: Diagnosis not present

## 2022-04-01 DIAGNOSIS — C7800 Secondary malignant neoplasm of unspecified lung: Secondary | ICD-10-CM | POA: Diagnosis not present

## 2022-04-01 DIAGNOSIS — R5381 Other malaise: Secondary | ICD-10-CM | POA: Diagnosis not present

## 2022-04-01 DIAGNOSIS — C3412 Malignant neoplasm of upper lobe, left bronchus or lung: Secondary | ICD-10-CM | POA: Diagnosis not present

## 2022-04-01 LAB — CBC WITH DIFFERENTIAL (CANCER CENTER ONLY)
Abs Immature Granulocytes: 0.02 10*3/uL (ref 0.00–0.07)
Basophils Absolute: 0 10*3/uL (ref 0.0–0.1)
Basophils Relative: 1 %
Eosinophils Absolute: 0.2 10*3/uL (ref 0.0–0.5)
Eosinophils Relative: 3 %
HCT: 39.8 % (ref 36.0–46.0)
Hemoglobin: 12.8 g/dL (ref 12.0–15.0)
Immature Granulocytes: 0 %
Lymphocytes Relative: 13 %
Lymphs Abs: 0.7 10*3/uL (ref 0.7–4.0)
MCH: 29.7 pg (ref 26.0–34.0)
MCHC: 32.2 g/dL (ref 30.0–36.0)
MCV: 92.3 fL (ref 80.0–100.0)
Monocytes Absolute: 0.5 10*3/uL (ref 0.1–1.0)
Monocytes Relative: 10 %
Neutro Abs: 3.9 10*3/uL (ref 1.7–7.7)
Neutrophils Relative %: 73 %
Platelet Count: 400 10*3/uL (ref 150–400)
RBC: 4.31 MIL/uL (ref 3.87–5.11)
RDW: 15.2 % (ref 11.5–15.5)
WBC Count: 5.2 10*3/uL (ref 4.0–10.5)
nRBC: 0 % (ref 0.0–0.2)

## 2022-04-01 LAB — CMP (CANCER CENTER ONLY)
ALT: 18 U/L (ref 0–44)
AST: 15 U/L (ref 15–41)
Albumin: 3.7 g/dL (ref 3.5–5.0)
Alkaline Phosphatase: 93 U/L (ref 38–126)
Anion gap: 6 (ref 5–15)
BUN: 19 mg/dL (ref 8–23)
CO2: 26 mmol/L (ref 22–32)
Calcium: 9.2 mg/dL (ref 8.9–10.3)
Chloride: 105 mmol/L (ref 98–111)
Creatinine: 0.52 mg/dL (ref 0.44–1.00)
GFR, Estimated: >60 mL/min (ref >60)
Glucose, Bld: 143 mg/dL — ABNORMAL HIGH (ref 70–99)
Potassium: 3.9 mmol/L (ref 3.5–5.1)
Sodium: 137 mmol/L (ref 135–145)
Total Bilirubin: 0.3 mg/dL (ref 0.3–1.2)
Total Protein: 6.7 g/dL (ref 6.5–8.1)

## 2022-04-01 MED ORDER — PALONOSETRON HCL INJECTION 0.25 MG/5ML
0.2500 mg | Freq: Once | INTRAVENOUS | Status: AC
  Administered 2022-04-01: 0.25 mg via INTRAVENOUS
  Filled 2022-04-01: qty 5

## 2022-04-01 MED ORDER — SODIUM CHLORIDE 0.9 % IV SOLN
10.0000 mg | Freq: Once | INTRAVENOUS | Status: AC
  Administered 2022-04-01: 10 mg via INTRAVENOUS
  Filled 2022-04-01: qty 10

## 2022-04-01 MED ORDER — FAMOTIDINE IN NACL 20-0.9 MG/50ML-% IV SOLN
20.0000 mg | Freq: Once | INTRAVENOUS | Status: AC
  Administered 2022-04-01: 20 mg via INTRAVENOUS
  Filled 2022-04-01: qty 50

## 2022-04-01 MED ORDER — SODIUM CHLORIDE 0.9 % IV SOLN: Freq: Once | INTRAVENOUS | Status: AC

## 2022-04-01 MED ORDER — HEPARIN SOD (PORK) LOCK FLUSH 100 UNIT/ML IV SOLN
500.0000 [IU] | Freq: Once | INTRAVENOUS | Status: AC | PRN
  Administered 2022-04-01: 500 [IU]

## 2022-04-01 MED ORDER — SODIUM CHLORIDE 0.9 % IV SOLN
167.2000 mg | Freq: Once | INTRAVENOUS | Status: AC
  Administered 2022-04-01: 170 mg via INTRAVENOUS
  Filled 2022-04-01: qty 17

## 2022-04-01 MED ORDER — SODIUM CHLORIDE 0.9 % IV SOLN
45.0000 mg/m2 | Freq: Once | INTRAVENOUS | Status: AC
  Administered 2022-04-01: 84 mg via INTRAVENOUS
  Filled 2022-04-01: qty 14

## 2022-04-01 MED ORDER — DIPHENHYDRAMINE HCL 50 MG/ML IJ SOLN
50.0000 mg | Freq: Once | INTRAMUSCULAR | Status: AC
  Administered 2022-04-01: 50 mg via INTRAVENOUS
  Filled 2022-04-01: qty 1

## 2022-04-01 MED ORDER — SODIUM CHLORIDE 0.9% FLUSH
10.0000 mL | INTRAVENOUS | Status: DC | PRN
  Administered 2022-04-01: 10 mL

## 2022-04-01 MED ORDER — SODIUM CHLORIDE 0.9% FLUSH
10.0000 mL | Freq: Once | INTRAVENOUS | Status: AC
  Administered 2022-04-01: 10 mL

## 2022-04-01 NOTE — Patient Instructions (Signed)
[facility]  Discharge Instructions: Thank you for choosing [facility] to provide your oncology and hematology care.   If you have a lab appointment with the Cancer Center, please go directly to the Cancer Center and check in at the registration area.   Wear comfortable clothing and clothing appropriate for easy access to any Portacath or PICC line.   We strive to give you quality time with your provider. You may need to reschedule your appointment if you arrive late (15 or more minutes).  Arriving late affects you and other patients whose appointments are after yours.  Also, if you miss three or more appointments without notifying the office, you may be dismissed from the clinic at the provider's discretion.      For prescription refill requests, have your pharmacy contact our office and allow 72 hours for refills to be completed.    Today you received the following chemotherapy and/or immunotherapy agents: paclitaxel and carboplatin      To help prevent nausea and vomiting after your treatment, we encourage you to take your nausea medication as directed.  BELOW ARE SYMPTOMS THAT SHOULD BE REPORTED IMMEDIATELY: *FEVER GREATER THAN 100.4 F (38 C) OR HIGHER *CHILLS OR SWEATING *NAUSEA AND VOMITING THAT IS NOT CONTROLLED WITH YOUR NAUSEA MEDICATION *UNUSUAL SHORTNESS OF BREATH *UNUSUAL BRUISING OR BLEEDING *URINARY PROBLEMS (pain or burning when urinating, or frequent urination) *BOWEL PROBLEMS (unusual diarrhea, constipation, pain near the anus) TENDERNESS IN MOUTH AND THROAT WITH OR WITHOUT PRESENCE OF ULCERS (sore throat, sores in mouth, or a toothache) UNUSUAL RASH, SWELLING OR PAIN  UNUSUAL VAGINAL DISCHARGE OR ITCHING   Items with * indicate a potential emergency and should be followed up as soon as possible or go to the Emergency Department if any problems should occur.  Please show the CHEMOTHERAPY ALERT CARD or IMMUNOTHERAPY  ALERT CARD at check-in to the Emergency Department and triage nurse.  Should you have questions after your visit or need to cancel or reschedule your appointment, please contact [facility]  Dept: [phone]  and follow the prompts.  Office hours are 8:00 a.m. to 4:30 p.m. Monday - Friday. Please note that voicemails left after 4:00 p.m. may not be returned until the following business day.  We are closed weekends and major holidays. You have access to a nurse at all times for urgent questions. Please call the main number to the clinic Dept: [phone] and follow the prompts.   For any non-urgent questions, you may also contact your provider using MyChart. We now offer e-Visits for anyone 18 and older to request care online for non-urgent symptoms. For details visit [email or web address].   Also download the MyChart app! Go to the app store, search "MyChart", open the app, select [place], and log in with your MyChart username and password.   

## 2022-04-06 ENCOUNTER — Telehealth: Payer: Self-pay | Admitting: Hematology and Oncology

## 2022-04-06 MED FILL — Dexamethasone Sodium Phosphate Inj 100 MG/10ML: INTRAMUSCULAR | Qty: 1 | Status: AC

## 2022-04-06 NOTE — Telephone Encounter (Signed)
Spoke with patient confirming upcoming appointment change  

## 2022-04-07 ENCOUNTER — Inpatient Hospital Stay (HOSPITAL_BASED_OUTPATIENT_CLINIC_OR_DEPARTMENT_OTHER): Payer: 59 | Admitting: Hematology and Oncology

## 2022-04-07 ENCOUNTER — Other Ambulatory Visit: Payer: Self-pay

## 2022-04-07 ENCOUNTER — Inpatient Hospital Stay: Payer: 59

## 2022-04-07 VITALS — BP 141/57 | HR 100 | Temp 97.6°F | Resp 16 | Wt 169.8 lb

## 2022-04-07 DIAGNOSIS — G8929 Other chronic pain: Secondary | ICD-10-CM

## 2022-04-07 DIAGNOSIS — Z95828 Presence of other vascular implants and grafts: Secondary | ICD-10-CM

## 2022-04-07 DIAGNOSIS — N6310 Unspecified lump in the right breast, unspecified quadrant: Secondary | ICD-10-CM | POA: Diagnosis not present

## 2022-04-07 DIAGNOSIS — M549 Dorsalgia, unspecified: Secondary | ICD-10-CM | POA: Diagnosis not present

## 2022-04-07 DIAGNOSIS — C3490 Malignant neoplasm of unspecified part of unspecified bronchus or lung: Secondary | ICD-10-CM

## 2022-04-07 DIAGNOSIS — C7800 Secondary malignant neoplasm of unspecified lung: Secondary | ICD-10-CM | POA: Diagnosis not present

## 2022-04-07 DIAGNOSIS — Z79899 Other long term (current) drug therapy: Secondary | ICD-10-CM | POA: Diagnosis not present

## 2022-04-07 DIAGNOSIS — C7931 Secondary malignant neoplasm of brain: Secondary | ICD-10-CM | POA: Diagnosis not present

## 2022-04-07 DIAGNOSIS — Z5111 Encounter for antineoplastic chemotherapy: Secondary | ICD-10-CM | POA: Diagnosis not present

## 2022-04-07 DIAGNOSIS — R739 Hyperglycemia, unspecified: Secondary | ICD-10-CM

## 2022-04-07 DIAGNOSIS — C3412 Malignant neoplasm of upper lobe, left bronchus or lung: Secondary | ICD-10-CM | POA: Diagnosis not present

## 2022-04-07 LAB — CMP (CANCER CENTER ONLY)
ALT: 18 U/L (ref 0–44)
AST: 14 U/L — ABNORMAL LOW (ref 15–41)
Albumin: 3.7 g/dL (ref 3.5–5.0)
Alkaline Phosphatase: 86 U/L (ref 38–126)
Anion gap: 6 (ref 5–15)
BUN: 13 mg/dL (ref 8–23)
CO2: 26 mmol/L (ref 22–32)
Calcium: 9.2 mg/dL (ref 8.9–10.3)
Chloride: 107 mmol/L (ref 98–111)
Creatinine: 0.48 mg/dL (ref 0.44–1.00)
GFR, Estimated: >60 mL/min (ref >60)
Glucose, Bld: 137 mg/dL — ABNORMAL HIGH (ref 70–99)
Potassium: 3.9 mmol/L (ref 3.5–5.1)
Sodium: 139 mmol/L (ref 135–145)
Total Bilirubin: 0.4 mg/dL (ref 0.3–1.2)
Total Protein: 6.4 g/dL — ABNORMAL LOW (ref 6.5–8.1)

## 2022-04-07 LAB — CBC WITH DIFFERENTIAL (CANCER CENTER ONLY)
Abs Immature Granulocytes: 0.04 10*3/uL (ref 0.00–0.07)
Basophils Absolute: 0 10*3/uL (ref 0.0–0.1)
Basophils Relative: 1 %
Eosinophils Absolute: 0.1 10*3/uL (ref 0.0–0.5)
Eosinophils Relative: 2 %
HCT: 39 % (ref 36.0–46.0)
Hemoglobin: 12.9 g/dL (ref 12.0–15.0)
Immature Granulocytes: 1 %
Lymphocytes Relative: 15 %
Lymphs Abs: 0.6 10*3/uL — ABNORMAL LOW (ref 0.7–4.0)
MCH: 30.4 pg (ref 26.0–34.0)
MCHC: 33.1 g/dL (ref 30.0–36.0)
MCV: 91.8 fL (ref 80.0–100.0)
Monocytes Absolute: 0.5 10*3/uL (ref 0.1–1.0)
Monocytes Relative: 12 %
Neutro Abs: 2.9 10*3/uL (ref 1.7–7.7)
Neutrophils Relative %: 69 %
Platelet Count: 389 10*3/uL (ref 150–400)
RBC: 4.25 MIL/uL (ref 3.87–5.11)
RDW: 14.9 % (ref 11.5–15.5)
WBC Count: 4.1 10*3/uL (ref 4.0–10.5)
nRBC: 0 % (ref 0.0–0.2)

## 2022-04-07 MED ORDER — SODIUM CHLORIDE 0.9 % IV SOLN
167.2000 mg | Freq: Once | INTRAVENOUS | Status: AC
  Administered 2022-04-07: 170 mg via INTRAVENOUS
  Filled 2022-04-07: qty 17

## 2022-04-07 MED ORDER — SODIUM CHLORIDE 0.9% FLUSH
10.0000 mL | INTRAVENOUS | Status: DC | PRN
  Administered 2022-04-07: 10 mL

## 2022-04-07 MED ORDER — SODIUM CHLORIDE 0.9% FLUSH
10.0000 mL | Freq: Once | INTRAVENOUS | Status: AC
  Administered 2022-04-07: 10 mL

## 2022-04-07 MED ORDER — SODIUM CHLORIDE 0.9 % IV SOLN: Freq: Once | INTRAVENOUS | Status: AC

## 2022-04-07 MED ORDER — FAMOTIDINE IN NACL 20-0.9 MG/50ML-% IV SOLN
20.0000 mg | Freq: Once | INTRAVENOUS | Status: AC
  Administered 2022-04-07: 20 mg via INTRAVENOUS
  Filled 2022-04-07: qty 50

## 2022-04-07 MED ORDER — PALONOSETRON HCL INJECTION 0.25 MG/5ML
0.2500 mg | Freq: Once | INTRAVENOUS | Status: AC
  Administered 2022-04-07: 0.25 mg via INTRAVENOUS
  Filled 2022-04-07: qty 5

## 2022-04-07 MED ORDER — SODIUM CHLORIDE 0.9 % IV SOLN
45.0000 mg/m2 | Freq: Once | INTRAVENOUS | Status: AC
  Administered 2022-04-07: 84 mg via INTRAVENOUS
  Filled 2022-04-07: qty 14

## 2022-04-07 MED ORDER — DIPHENHYDRAMINE HCL 50 MG/ML IJ SOLN
50.0000 mg | Freq: Once | INTRAMUSCULAR | Status: AC
  Administered 2022-04-07: 50 mg via INTRAVENOUS
  Filled 2022-04-07: qty 1

## 2022-04-07 MED ORDER — SODIUM CHLORIDE 0.9 % IV SOLN
200.0000 mg | Freq: Once | INTRAVENOUS | Status: AC
  Administered 2022-04-07: 200 mg via INTRAVENOUS
  Filled 2022-04-07: qty 8

## 2022-04-07 MED ORDER — HEPARIN SOD (PORK) LOCK FLUSH 100 UNIT/ML IV SOLN
500.0000 [IU] | Freq: Once | INTRAVENOUS | Status: AC | PRN
  Administered 2022-04-07: 500 [IU]

## 2022-04-07 MED ORDER — SODIUM CHLORIDE 0.9 % IV SOLN
10.0000 mg | Freq: Once | INTRAVENOUS | Status: AC
  Administered 2022-04-07: 10 mg via INTRAVENOUS
  Filled 2022-04-07: qty 10

## 2022-04-07 MED ORDER — HEPARIN SOD (PORK) LOCK FLUSH 100 UNIT/ML IV SOLN
500.0000 [IU] | Freq: Once | INTRAVENOUS | Status: AC
  Administered 2022-04-07: 500 [IU]

## 2022-04-07 NOTE — Assessment & Plan Note (Addendum)
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  She also appears to have had some regional adenopathy including prevascular, AP window and left hilar adenopathy.  She has multiple intracranial metastatic lesions which are likely secondary to metastatic lung cancer given presence of regional adenopathy.   She is now status post SRS to the brain lesions. She started concurrent chemoradiation with CarboTaxol on August 30, 2021.  She completed chemoradiation on October 15, 2021.  She is now on single agent Keytruda every 3 weeks.   End of treatment PET/CT reviewed consistent with response in the lung, likely postradiation changes noted. PET also commented on decrease in size of the breast mass.  We started noticing increasing size of the right breast mass while she was on immunotherapy alone.  We have restarted her on weekly CarboTaxol with immunotherapy.  If she does not have any evidence of progression or new disease from both breast cancer and lung cancer standpoint, we may represent her in the breast tumor board for role of surgery. At this time, however we are also investigating pericardial effusion, she is now monitored by Dr Rona Ravens to continue treatment as planned at this time. No changes since last visit.  Benay Pike MD

## 2022-04-07 NOTE — Patient Instructions (Signed)
[facility]  Discharge Instructions: Thank you for choosing [facility] to provide your oncology and hematology care.   If you have a lab appointment with the [facility], please go directly to the [facility] and check in at the registration area.   Wear comfortable clothing and clothing appropriate for easy access to any Portacath or PICC line.   We strive to give you quality time with your provider. You may need to reschedule your appointment if you arrive late (15 or more minutes).  Arriving late affects you and other patients whose appointments are after yours.  Also, if you miss three or more appointments without notifying the office, you may be dismissed from the clinic at the provider's discretion.      For prescription refill requests, have your pharmacy contact our office and allow 72 hours for refills to be completed.    Today you received the following chemotherapy and/or immunotherapy agents: Pembrolizumab (Keytruda), Paclitaxel (Taxol) and Carboplatin      To help prevent nausea and vomiting after your treatment, we encourage you to take your nausea medication as directed.  BELOW ARE SYMPTOMS THAT SHOULD BE REPORTED IMMEDIATELY: *FEVER GREATER THAN 100.4 F (38 C) OR HIGHER *CHILLS OR SWEATING *NAUSEA AND VOMITING THAT IS NOT CONTROLLED WITH YOUR NAUSEA MEDICATION *UNUSUAL SHORTNESS OF BREATH *UNUSUAL BRUISING OR BLEEDING *URINARY PROBLEMS (pain or burning when urinating, or frequent urination) *BOWEL PROBLEMS (unusual diarrhea, constipation, pain near the anus) TENDERNESS IN MOUTH AND THROAT WITH OR WITHOUT PRESENCE OF ULCERS (sore throat, sores in mouth, or a toothache) UNUSUAL RASH, SWELLING OR PAIN  UNUSUAL VAGINAL DISCHARGE OR ITCHING   Items with * indicate a potential emergency and should be followed up as soon as possible or go to the Emergency Department if any problems should occur.  Please show the  CHEMOTHERAPY ALERT CARD or IMMUNOTHERAPY ALERT CARD at check-in to the Emergency Department and triage nurse.  Should you have questions after your visit or need to cancel or reschedule your appointment, please contact [facility]  Dept: [phone]  and follow the prompts.  Office hours are 8:00 a.m. to 4:30 p.m. Monday - Friday. Please note that voicemails left after 4:00 p.m. may not be returned until the following business day.  We are closed weekends and major holidays. You have access to a nurse at all times for urgent questions. Please call the main number to the clinic Dept: [phone] and follow the prompts.   For any non-urgent questions, you may also contact your provider using MyChart. We now offer e-Visits for anyone 61 and older to request care online for non-urgent symptoms. For details visit [email or web address].   Also download the MyChart app! Go to the app store, search "MyChart", open the app, select [facility], and log in with your MyChart username and password.

## 2022-04-07 NOTE — Progress Notes (Signed)
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  The report date is February 27, 2022.  The CancerNext-Expanded gene panel offered by [facility] and includes sequencing and rearrangement analysis for the following 77 genes: AIP, ALK, APC*, ATM*, AXIN2, BAP1, BARD1, BLM, BMPR1A, BRCA1*, BRCA2*, BRIP1*, CDC73, CDH1*, CDK4, CDKN1B, CDKN2A, CHEK2*, CTNNA1, DICER1, FANCC, FH, FLCN, GALNT12, KIF1B, LZTR1, MAX, MEN1, MET, MLH1*, MSH2*, MSH3, MSH6*, MUTYH*, NBN, NF1*, NF2, NTHL1, PALB2*, PHOX2B, PMS2*, POT1, PRKAR1A, PTCH1, PTEN*, RAD51C*, RAD51D*, RB1, RECQL, RET, SDHA, SDHAF2, SDHB, SDHC, SDHD, SMAD4, SMARCA4, SMARCB1, SMARCE1, STK11, SUFU, TMEM127, TP53*, TSC1, TSC2, VHL and XRCC2 (sequencing and deletion/duplication); EGFR, EGLN1, HOXB13, KIT, MITF, PDGFRA, POLD1, and POLE (sequencing only); EPCAM and GREM1 (deletion/duplication only). DNA and RNA analyses performed for * genes.    Malignant neoplasm of unspecified part of unspecified bronchus or lung ([place])  08/13/2021 Initial Diagnosis   Malignant neoplasm of unspecified part of unspecified bronchus or lung ([place])   08/30/2021 - 09/13/2021 Chemotherapy   Patient is on Treatment Plan : LUNG Carboplatin / Paclitaxel + XRT q7d     08/30/2021 - 10/11/2021 Chemotherapy   Patient is on Treatment Plan : LUNG Carboplatin + Paclitaxel + XRT q7d     11/16/2021 - 02/17/2022 Chemotherapy   Patient is on Treatment Plan : LUNG Pembrolizumab Q 21D     03/16/2022 - 03/16/2022 Chemotherapy   Patient is on Treatment Plan : BREAST Pembrolizumab (400) D1 + Carboplatin (1.5) weekly + Paclitaxel (80) weekly q42d X 2  cycles / Pembrolizumab (400) D1 + AC D1,22 q42d x 2 cycles     03/18/2022 -  Chemotherapy   Patient is on Treatment Plan : BREAST Pembrolizumab (200) D1 + Carboplatin (2) D1,8,15 + Paclitaxel (45) D1,8,15 q21d X 2 cycles       CURRENT THERAPY: Pembrolizumab  INTERVAL HISTORY:  Jennifer Cowan 77 y.o. female returns for follow-up after undergoing CT chest on February 24, 2022.  This scan demonstrated a left upper lung dense consolidation or atelectasis with nonvisualization of left upper lobe mass observed on the prior study, new large layering left-sided pleural effusion and moderate pericardial effusion, resolution of mediastinal and left axillary adenopathy consistent with therapy response, right breast mass demonstrating a partial response with decrease in size, and abnormalities in the bilateral partially imaged kidneys.  Numerous hepatic cysts.    She also underwent bilateral breast mammogram and ultrasound on February 26, 2022.  This demonstrated that the mass was 6 x 4.8 x 5.2 cm and previously was 5.9 x 3.6 x 4.5 cm.  There is more bulk noted to the deep aspect of the mass along with new skin thickening along the anterior and medial aspect of the right breast with trabecular thickening throughout the medial and inferior right breast.  There was no suspicious right axillary lymphadenopathy noted.  Since her last imaging showed increasing bulk in the right breast mass, we have recommended going back on weekly CarboTaxol along with every 21-day Keytruda.  If her next set of imaging does not show progression or new evidence of disease, we may be able to consult breast surgery to see if she will be a candidate.  Interval History  She continues to complain of pain in the area of shingles.  She denies any other complaints. She denies any nausea, vomiting or diarrhea. No neuropathy. 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  MEDICAL HISTORY: Past Medical History:  Diagnosis Date   Anemia    Arthritis    Breast cancer ([place])    Cancer ([place]) 1999   Breast Cancer   Family history of breast cancer    Hyperlipidemia    Hypertension    Osteopenia 01/12/2021   Personal history of malignant neoplasm of breast     SURGICAL HISTORY: Past Surgical History:  Procedure Laterality Date   BREAST SURGERY  1999   Masectomy- left   BRONCHIAL BIOPSY  07/30/2021   Procedure: BRONCHIAL BIOPSIES;  Surgeon: Collene Gobble, MD;  Location: [facility] ENDOSCOPY;  Service: Pulmonary;;   BRONCHIAL BRUSHINGS  07/30/2021   Procedure: BRONCHIAL BRUSHINGS;  Surgeon: Collene Gobble, MD;  Location[facility] ENDOSCOPY;  Service: Pulmonary;;   BRONCHIAL NEEDLE ASPIRATION BIOPSY  07/30/2021   Procedure: BRONCHIAL NEEDLE ASPIRATION BIOPSIES;  Surgeon: Collene Gobble, MD;  Location: [facility] ENDOSCOPY;  Service: Pulmonary;;   CYSTECTOMY     cyst removed off top of head   IR IMAGING GUIDED PORT INSERTION  08/20/2021   MASTECTOMY     VIDEO BRONCHOSCOPY WITH RADIAL ENDOBRONCHIAL ULTRASOUND  07/30/2021    Procedure: VIDEO BRONCHOSCOPY WITH RADIAL ENDOBRONCHIAL ULTRASOUND;  Surgeon: Collene Gobble, MD;  Location: [facility] ENDOSCOPY;  Service: Pulmonary;;    SOCIAL HISTORY: Social History   Socioeconomic History   Marital status: Single    Spouse name: Not on file   Number of children: 0   Years of education: 12   Highest education level: High school graduate  Occupational History   Occupation: retired  Tobacco Use   Smoking status: Never   Smokeless tobacco: Never  Vaping Use   Vaping Use: Never used  Substance and Sexual Activity   Alcohol use: No   Drug use: Never   Sexual activity: Yes    Birth control/protection: Post-menopausal  Other Topics Concern   Not on file  Social History Narrative   Not on file   Social Determinants of Health   Financial Resource Strain: Not on file  Food Insecurity: Not on file  Transportation Needs: Not on file  Physical Activity: Not on file  Stress: Not on file  Social Connections: Not on file  Intimate Partner Violence: Not on file    FAMILY HISTORY: Family History  Problem Relation Age of Onset   Arthritis Mother  Breast cancer Sister 23   Diabetes Sister    Hyperlipidemia Sister    Hypertension Sister    Stroke Sister    Dementia Sister    Diabetes Brother    Hypertension Brother    Breast cancer Niece 44    Review of Systems  Constitutional:  Positive for fatigue. Negative for appetite change, chills, fever and unexpected weight change.  HENT:   Negative for hearing loss, lump/mass and trouble swallowing.   Eyes:  Negative for eye problems and icterus.  Respiratory:  Negative for chest tightness, cough and shortness of breath.   Cardiovascular:  Negative for chest pain, leg swelling and palpitations.  Gastrointestinal:  Negative for abdominal distention, abdominal pain, constipation, diarrhea, nausea and vomiting.  Endocrine: Negative for hot flashes.  Genitourinary:  Negative for difficulty urinating.   Musculoskeletal:   Positive for back pain. Negative for arthralgias.  Skin:  Negative for itching and rash.  Neurological:  Negative for dizziness, extremity weakness, headaches and numbness.  Hematological:  Negative for adenopathy. Does not bruise/bleed easily.  Psychiatric/Behavioral:  Negative for depression. The patient is not nervous/anxious.       PHYSICAL EXAMINATION  ECOG PERFORMANCE STATUS: 1 - Symptomatic but completely ambulatory  Vitals:   04/07/22 0923  BP: (!) 141/57  Pulse: 100  Resp: 16  Temp: 97.6 F (36.4 C)  SpO2: 97%     Physical Exam Constitutional:      General: She is not in acute distress.    Appearance: Normal appearance. She is not toxic-appearing.  HENT:     Head: Normocephalic and atraumatic.  Eyes:     General: No scleral icterus. Cardiovascular:     Rate and Rhythm: Normal rate and regular rhythm.     Pulses: Normal pulses.     Heart sounds: Normal heart sounds.     Comments: Heart sounds clear and loud, no suggestion of large pericardial effusion. Pulmonary:     Effort: Pulmonary effort is normal.     Breath sounds: Normal breath sounds.     Comments: No significant findings suggestive of effusion, if any this must be small Chest:     Comments: Right breast mass stable. No palpable right axillary adenopathy Abdominal:     General: Abdomen is flat. Bowel sounds are normal. There is no distension.     Palpations: Abdomen is soft.     Tenderness: There is no abdominal tenderness.  Musculoskeletal:        General: No swelling.     Cervical back: Neck supple.  Lymphadenopathy:     Cervical: No cervical adenopathy.  Skin:    General: Skin is warm and dry.     Findings: No rash.  Neurological:     General: No focal deficit present.     Mental Status: She is alert.  Psychiatric:        Mood and Affect: Mood normal.        Behavior: Behavior normal.     LABORATORY DATA:  CBC    Component Value Date/Time   WBC 4.1 04/07/2022 0855   WBC 6.7  07/25/2021 0111   RBC 4.25 04/07/2022 0855   HGB 12.9 04/07/2022 0855   HGB 15.7 07/07/2021 1538   HCT 39.0 04/07/2022 0855   HCT 46.6 07/07/2021 1538   PLT 389 04/07/2022 0855   PLT 450 07/07/2021 1538   MCV 91.8 04/07/2022 0855   MCV 88 07/07/2021 1538   MCH 30.4 04/07/2022 0855   MCHC 33.1 04/07/2022 0855  RDW 14.9 04/07/2022 0855   RDW 12.9 07/07/2021 1538   LYMPHSABS 0.6 (L) 04/07/2022 0855   LYMPHSABS 1.6 07/07/2021 1538   MONOABS 0.5 04/07/2022 0855   EOSABS 0.1 04/07/2022 0855   EOSABS 0.1 07/07/2021 1538   BASOSABS 0.0 04/07/2022 0855   BASOSABS 0.0 07/07/2021 1538    CMP     Component Value Date/Time   NA 137 04/01/2022 0852   NA 141 07/07/2021 1538   K 3.9 04/01/2022 0852   CL 105 04/01/2022 0852   CO2 26 04/01/2022 0852   GLUCOSE 143 (H) 04/01/2022 0852   BUN 19 04/01/2022 0852   BUN 9 07/07/2021 1538   CREATININE 0.52 04/01/2022 0852   CALCIUM 9.2 04/01/2022 0852   PROT 6.7 04/01/2022 0852   PROT 7.9 07/07/2021 1538   ALBUMIN 3.7 04/01/2022 0852   ALBUMIN 4.6 07/07/2021 1538   AST 15 04/01/2022 0852   ALT 18 04/01/2022 0852   ALKPHOS 93 04/01/2022 0852   BILITOT 0.3 04/01/2022 0852   GFRNONAA >60 04/01/2022 0852   GFRAA 103 03/05/2020 1119    ASSESSMENT and THERAPY PLAN:   Malignant neoplasm of unspecified part of unspecified bronchus or lung ([place]) This is a 77 year old patient with 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  She also appears to have had some regional adenopathy including prevascular, AP window and left hilar adenopathy.  She has multiple intracranial metastatic lesions which are likely secondary to metastatic lung cancer given presence of regional adenopathy.   She is now status post SRS to the brain lesions. She started concurrent chemoradiation with CarboTaxol on August 30, 2021.  She completed chemoradiation on October 15, 2021.  She is now on  single agent Keytruda every 3 weeks.   End of treatment PET/CT reviewed consistent with response in the lung, likely postradiation changes noted. PET also commented on decrease in size of the breast mass.  We started noticing increasing size of the right breast mass while she was on immunotherapy alone.  We have restarted her on weekly CarboTaxol with immunotherapy.  If she does not have any evidence of progression or new disease from both breast cancer and lung cancer standpoint, we may represent her in the breast tumor board for role of surgery. At this time, however we are also investigating pericardial effusion, she is now monitored by Dr Rona Ravens to continue treatment as planned at this time. No changes since last visit.  Benay Pike MD   Total time: 20 min CBC from today with no concerns, CMP pending at the time of my visit.  Okay to change the infusion to Wednesday of next week for convenience.  *Total Encounter Time as defined by the Centers for Medicare and Medicaid Services includes, in addition to the face-to-face time of a patient visit (documented in the note above) non-face-to-face time: obtaining and reviewing outside history, ordering and reviewing medications, tests or procedures, care coordination (communications with other health care professionals or caregivers) and documentation in the medical record.

## 2022-04-08 ENCOUNTER — Ambulatory Visit (INDEPENDENT_AMBULATORY_CARE_PROVIDER_SITE_OTHER): Payer: 59 | Admitting: Family Medicine

## 2022-04-08 ENCOUNTER — Encounter: Payer: Self-pay | Admitting: Family Medicine

## 2022-04-08 VITALS — BP 119/59 | HR 92 | Temp 97.5°F | Ht 68.0 in | Wt 171.2 lb

## 2022-04-08 DIAGNOSIS — C3490 Malignant neoplasm of unspecified part of unspecified bronchus or lung: Secondary | ICD-10-CM | POA: Diagnosis not present

## 2022-04-08 DIAGNOSIS — E1159 Type 2 diabetes mellitus with other circulatory complications: Secondary | ICD-10-CM

## 2022-04-08 DIAGNOSIS — E785 Hyperlipidemia, unspecified: Secondary | ICD-10-CM | POA: Diagnosis not present

## 2022-04-08 DIAGNOSIS — E782 Mixed hyperlipidemia: Secondary | ICD-10-CM | POA: Diagnosis not present

## 2022-04-08 DIAGNOSIS — E1169 Type 2 diabetes mellitus with other specified complication: Secondary | ICD-10-CM | POA: Diagnosis not present

## 2022-04-08 DIAGNOSIS — I152 Hypertension secondary to endocrine disorders: Secondary | ICD-10-CM

## 2022-04-08 DIAGNOSIS — E1165 Type 2 diabetes mellitus with hyperglycemia: Secondary | ICD-10-CM | POA: Diagnosis not present

## 2022-04-08 LAB — BAYER DCA HB A1C WAIVED: HB A1C (BAYER DCA - WAIVED): 5.6 % (ref 4.8–5.6)

## 2022-04-08 NOTE — Progress Notes (Signed)
Established Patient Office Visit  Subjective   Patient ID: Jennifer Cowan, female    DOB: 08/14/1945  Age: 77 y.o. MRN: [id number]  Chief Complaint  Patient presents with   Medical Management of Chronic Issues   Diabetes   Hypertension    HPI Here with sister today.  DM Pt presents for follow up evaluation of Type 2 diabetes mellitus.  Current symptoms include none. Patient denies foot ulcerations, hypoglycemia , increased appetite, nausea, paresthesia of the feet, polydipsia, polyuria, visual disturbances, vomiting, and weight loss.  Current diabetic medications include: none. She has now titrated off of her insulin   Current monitoring regimen: home blood tests - daily Home blood sugar records:  overall 80-120 Any episodes of hypoglycemia? No  Is She on ACE inhibitor or angiotensin II receptor blocker?  Yes, benazepril Is She on statin? Yes atorvastatin 80 Is She on ASA 81 mg daily?  Yes  2. HTN Complaint with meds - Yes Current Medications - amlodipine 10, benazepril 10 mg Pertinent ROS:  Visual Disturbances - No Chest pain - No Dyspnea - No Palpitations - No LE edema - No  3. HLD On atorvastatin.  4. Cancer She continues to follow up with oncology. She recently completed her 3rd of 6th chemo treatments.    Past Medical History:  Diagnosis Date   Anemia    Arthritis    Breast cancer ([place])    Cancer ([place]) 1999   Breast Cancer   Family history of breast cancer    Hyperlipidemia    Hypertension    Osteopenia 01/12/2021   Personal history of malignant neoplasm of breast       ROS As per HPI.    Objective:     BP (!) 119/59   Pulse 92   Temp (!) 97.5 F (36.4 C) (Temporal)   Ht 5\' 8"  (1.727 m)   Wt 171 lb 4 oz (77.7 kg)   SpO2 98%   BMI 26.04 kg/m  BP Readings from Last 3 Encounters:  04/08/22 (!) 119/59  04/07/22 (!) 141/57  04/01/22 (!) 129/53      Physical Exam Vitals and nursing note reviewed.  Constitutional:      General: She  is not in acute distress.    Appearance: Normal appearance. She is not ill-appearing, toxic-appearing or diaphoretic.  Neck:     Thyroid: No thyroid mass, thyromegaly or thyroid tenderness.     Vascular: No carotid bruit.  Cardiovascular:     Rate and Rhythm: Normal rate and regular rhythm.     Heart sounds: Normal heart sounds. No murmur heard. Pulmonary:     Effort: Pulmonary effort is normal. No respiratory distress.     Breath sounds: Normal breath sounds.  Abdominal:     General: Bowel sounds are normal. There is no distension.     Palpations: Abdomen is soft.     Tenderness: There is no abdominal tenderness. There is no guarding or rebound.  Musculoskeletal:     Cervical back: Neck supple. No rigidity.     Right lower leg: No edema.     Left lower leg: No edema.  Skin:    General: Skin is warm and dry.  Neurological:     General: No focal deficit present.     Mental Status: She is alert and oriented to person, place, and time.     Motor: No weakness.     Gait: Gait normal.  Psychiatric:        Attention  and Perception: Attention normal.        Mood and Affect: Mood normal.        Behavior: Behavior normal.      Last CBC Lab Results  Component Value Date   WBC 4.1 04/07/2022   HGB 12.9 04/07/2022   HCT 39.0 04/07/2022   MCV 91.8 04/07/2022   MCH 30.4 04/07/2022   RDW 14.9 04/07/2022   PLT 389 [id number]   Last metabolic panel Lab Results  Component Value Date   GLUCOSE 137 (H) 04/07/2022   NA 139 04/07/2022   K 3.9 04/07/2022   CL 107 04/07/2022   CO2 26 04/07/2022   BUN 13 04/07/2022   CREATININE 0.48 04/07/2022   GFRNONAA >60 04/07/2022   CALCIUM 9.2 04/07/2022   PROT 6.4 (L) 04/07/2022   ALBUMIN 3.7 04/07/2022   LABGLOB 3.3 07/07/2021   AGRATIO 1.4 07/07/2021   BILITOT 0.4 04/07/2022   ALKPHOS 86 04/07/2022   AST 14 (L) 04/07/2022   ALT 18 04/07/2022   ANIONGAP 6 04/07/2022   Last lipids Lab Results  Component Value Date   CHOL 197  10/06/2021   HDL 27 (L) 10/06/2021   LDLCALC 146 (H) 10/06/2021   TRIG 130 10/06/2021   CHOLHDL 7.3 (H) 10/06/2021   Last hemoglobin A1c Lab Results  Component Value Date   HGBA1C 5.6 04/08/2022   Last thyroid functions Lab Results  Component Value Date   TSH 2.452 03/18/2022   T4TOTAL 6.7 03/18/2022      The ASCVD Risk score (Arnett DK, et al., 2019) failed to calculate for the following reasons:   The patient has a prior MI or stroke diagnosis    Assessment & Plan:   [name] was seen today for medical management of chronic issues, diabetes and hypertension.  Diagnoses and all orders for this visit:  Type 2 diabetes mellitus with hyperglycemia, without long-term current use of insulin (HCC) A1c 5.6 today, at goal of <7 without medication. She is on an ACE/ARB and statin. Eye exam: reminded to schedule. Foot exam: UTD. Urine micro: UTD. Diet and exercise.  -     Bayer DCA Hb A1c Waived  Hyperlipidemia associated with type 2 diabetes mellitus (HCC) Last LDL not at goal. Her statin was increased. Repeat fasting panel today.  -     Lipid panel  Hypertension associated with type 2 diabetes mellitus ([place]) Well controlled on current regimen. Reviewed labs from 04/07/22.  Malignant neoplasm of unspecified part of unspecified bronchus or lung ([place]) Managed by oncology.    Return in about 6 months (around 10/09/2022) for CPE.   The patient indicates understanding of these issues and agrees with the plan.  [name], FNP

## 2022-04-09 LAB — LIPID PANEL
Chol/HDL Ratio: 3.9 ratio (ref 0.0–4.4)
Cholesterol, Total: 182 mg/dL (ref 100–199)
HDL: 47 mg/dL (ref >39)
LDL Chol Calc (NIH): 120 mg/dL — ABNORMAL HIGH (ref 0–99)
Triglycerides: 83 mg/dL (ref 0–149)
VLDL Cholesterol Cal: 15 mg/dL (ref 5–40)

## 2022-04-12 MED FILL — Dexamethasone Sodium Phosphate Inj 100 MG/10ML: INTRAMUSCULAR | Qty: 1 | Status: AC

## 2022-04-13 ENCOUNTER — Encounter: Payer: Self-pay | Admitting: Hematology and Oncology

## 2022-04-13 ENCOUNTER — Other Ambulatory Visit: Payer: Self-pay

## 2022-04-13 ENCOUNTER — Inpatient Hospital Stay: Payer: 59

## 2022-04-13 ENCOUNTER — Ambulatory Visit: Payer: 59 | Admitting: Hematology and Oncology

## 2022-04-13 ENCOUNTER — Inpatient Hospital Stay (HOSPITAL_BASED_OUTPATIENT_CLINIC_OR_DEPARTMENT_OTHER): Payer: 59 | Admitting: Hematology and Oncology

## 2022-04-13 ENCOUNTER — Other Ambulatory Visit: Payer: 59

## 2022-04-13 VITALS — BP 112/57 | HR 86 | Resp 16

## 2022-04-13 VITALS — BP 121/52 | HR 103 | Temp 97.8°F | Resp 16 | Ht 68.0 in | Wt 169.9 lb

## 2022-04-13 DIAGNOSIS — C7800 Secondary malignant neoplasm of unspecified lung: Secondary | ICD-10-CM | POA: Diagnosis not present

## 2022-04-13 DIAGNOSIS — N6310 Unspecified lump in the right breast, unspecified quadrant: Secondary | ICD-10-CM | POA: Diagnosis not present

## 2022-04-13 DIAGNOSIS — R739 Hyperglycemia, unspecified: Secondary | ICD-10-CM

## 2022-04-13 DIAGNOSIS — C3412 Malignant neoplasm of upper lobe, left bronchus or lung: Secondary | ICD-10-CM | POA: Diagnosis not present

## 2022-04-13 DIAGNOSIS — Z95828 Presence of other vascular implants and grafts: Secondary | ICD-10-CM

## 2022-04-13 DIAGNOSIS — Z79899 Other long term (current) drug therapy: Secondary | ICD-10-CM | POA: Diagnosis not present

## 2022-04-13 DIAGNOSIS — C3490 Malignant neoplasm of unspecified part of unspecified bronchus or lung: Secondary | ICD-10-CM

## 2022-04-13 DIAGNOSIS — Z5111 Encounter for antineoplastic chemotherapy: Secondary | ICD-10-CM | POA: Diagnosis not present

## 2022-04-13 DIAGNOSIS — C7931 Secondary malignant neoplasm of brain: Secondary | ICD-10-CM | POA: Diagnosis not present

## 2022-04-13 LAB — CMP (CANCER CENTER ONLY)
ALT: 18 U/L (ref 0–44)
AST: 14 U/L — ABNORMAL LOW (ref 15–41)
Albumin: 3.7 g/dL (ref 3.5–5.0)
Alkaline Phosphatase: 86 U/L (ref 38–126)
Anion gap: 6 (ref 5–15)
BUN: 20 mg/dL (ref 8–23)
CO2: 25 mmol/L (ref 22–32)
Calcium: 9.2 mg/dL (ref 8.9–10.3)
Chloride: 108 mmol/L (ref 98–111)
Creatinine: 0.55 mg/dL (ref 0.44–1.00)
GFR, Estimated: >60 mL/min (ref >60)
Glucose, Bld: 147 mg/dL — ABNORMAL HIGH (ref 70–99)
Potassium: 3.9 mmol/L (ref 3.5–5.1)
Sodium: 139 mmol/L (ref 135–145)
Total Bilirubin: 0.4 mg/dL (ref 0.3–1.2)
Total Protein: 6.6 g/dL (ref 6.5–8.1)

## 2022-04-13 LAB — CBC WITH DIFFERENTIAL (CANCER CENTER ONLY)
Abs Immature Granulocytes: 0.01 10*3/uL (ref 0.00–0.07)
Basophils Absolute: 0 10*3/uL (ref 0.0–0.1)
Basophils Relative: 1 %
Eosinophils Absolute: 0.1 10*3/uL (ref 0.0–0.5)
Eosinophils Relative: 1 %
HCT: 38.6 % (ref 36.0–46.0)
Hemoglobin: 12.6 g/dL (ref 12.0–15.0)
Immature Granulocytes: 0 %
Lymphocytes Relative: 14 %
Lymphs Abs: 0.6 10*3/uL — ABNORMAL LOW (ref 0.7–4.0)
MCH: 30.1 pg (ref 26.0–34.0)
MCHC: 32.6 g/dL (ref 30.0–36.0)
MCV: 92.1 fL (ref 80.0–100.0)
Monocytes Absolute: 0.4 10*3/uL (ref 0.1–1.0)
Monocytes Relative: 10 %
Neutro Abs: 3.1 10*3/uL (ref 1.7–7.7)
Neutrophils Relative %: 74 %
Platelet Count: 366 10*3/uL (ref 150–400)
RBC: 4.19 MIL/uL (ref 3.87–5.11)
RDW: 15.5 % (ref 11.5–15.5)
WBC Count: 4.2 10*3/uL (ref 4.0–10.5)
nRBC: 0 % (ref 0.0–0.2)

## 2022-04-13 MED ORDER — PALONOSETRON HCL INJECTION 0.25 MG/5ML
0.2500 mg | Freq: Once | INTRAVENOUS | Status: AC
  Administered 2022-04-13: 0.25 mg via INTRAVENOUS
  Filled 2022-04-13: qty 5

## 2022-04-13 MED ORDER — SODIUM CHLORIDE 0.9% FLUSH
10.0000 mL | Freq: Once | INTRAVENOUS | Status: AC
  Administered 2022-04-13: 10 mL

## 2022-04-13 MED ORDER — HEPARIN SOD (PORK) LOCK FLUSH 100 UNIT/ML IV SOLN
500.0000 [IU] | Freq: Once | INTRAVENOUS | Status: AC | PRN
  Administered 2022-04-13: 500 [IU]

## 2022-04-13 MED ORDER — SODIUM CHLORIDE 0.9 % IV SOLN: Freq: Once | INTRAVENOUS | Status: AC

## 2022-04-13 MED ORDER — FAMOTIDINE IN NACL 20-0.9 MG/50ML-% IV SOLN
20.0000 mg | Freq: Once | INTRAVENOUS | Status: AC
  Administered 2022-04-13: 20 mg via INTRAVENOUS
  Filled 2022-04-13: qty 50

## 2022-04-13 MED ORDER — SODIUM CHLORIDE 0.9 % IV SOLN
45.0000 mg/m2 | Freq: Once | INTRAVENOUS | Status: AC
  Administered 2022-04-13: 84 mg via INTRAVENOUS
  Filled 2022-04-13: qty 14

## 2022-04-13 MED ORDER — DIPHENHYDRAMINE HCL 50 MG/ML IJ SOLN
50.0000 mg | Freq: Once | INTRAMUSCULAR | Status: AC
  Administered 2022-04-13: 50 mg via INTRAVENOUS
  Filled 2022-04-13: qty 1

## 2022-04-13 MED ORDER — SODIUM CHLORIDE 0.9 % IV SOLN
167.2000 mg | Freq: Once | INTRAVENOUS | Status: AC
  Administered 2022-04-13: 170 mg via INTRAVENOUS
  Filled 2022-04-13: qty 17

## 2022-04-13 MED ORDER — HEPARIN SOD (PORK) LOCK FLUSH 100 UNIT/ML IV SOLN: 500.0000 [IU] | Freq: Once | INTRAVENOUS | Status: DC

## 2022-04-13 MED ORDER — SODIUM CHLORIDE 0.9 % IV SOLN
10.0000 mg | Freq: Once | INTRAVENOUS | Status: AC
  Administered 2022-04-13: 10 mg via INTRAVENOUS
  Filled 2022-04-13: qty 10

## 2022-04-13 MED ORDER — SODIUM CHLORIDE 0.9% FLUSH
10.0000 mL | INTRAVENOUS | Status: DC | PRN
  Administered 2022-04-13: 10 mL

## 2022-04-13 NOTE — Progress Notes (Signed)
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  The report date is February 27, 2022.  The CancerNext-Expanded gene panel offered by [facility] and includes sequencing and rearrangement analysis for the following 77 genes: AIP, ALK, APC*, ATM*, AXIN2, BAP1, BARD1, BLM, BMPR1A, BRCA1*, BRCA2*, BRIP1*, CDC73, CDH1*, CDK4, CDKN1B, CDKN2A, CHEK2*, CTNNA1, DICER1, FANCC, FH, FLCN, GALNT12, KIF1B, LZTR1, MAX, MEN1, MET, MLH1*, MSH2*, MSH3, MSH6*, MUTYH*, NBN, NF1*, NF2, NTHL1, PALB2*, PHOX2B, PMS2*, POT1, PRKAR1A, PTCH1, PTEN*, RAD51C*, RAD51D*, RB1, RECQL, RET, SDHA, SDHAF2, SDHB, SDHC, SDHD, SMAD4, SMARCA4, SMARCB1, SMARCE1, STK11, SUFU, TMEM127, TP53*, TSC1, TSC2, VHL and XRCC2 (sequencing and deletion/duplication); EGFR, EGLN1, HOXB13, KIT, MITF, PDGFRA, POLD1, and POLE (sequencing only); EPCAM and GREM1 (deletion/duplication only). DNA and RNA analyses performed for * genes.    Malignant neoplasm of unspecified part of unspecified bronchus or lung (Jennifer Cowan)  08/13/2021 Initial Diagnosis   Malignant neoplasm of unspecified part of unspecified bronchus or lung (Jennifer Cowan)   08/30/2021 - 09/13/2021 Chemotherapy   Patient is on Treatment Plan : LUNG Carboplatin / Paclitaxel + XRT q7d     08/30/2021 - 10/11/2021 Chemotherapy   Patient is on Treatment Plan : LUNG Carboplatin + Paclitaxel + XRT q7d     11/16/2021 - 02/17/2022 Chemotherapy   Patient is on Treatment Plan : LUNG Pembrolizumab Q 21D     03/16/2022 - 03/16/2022 Chemotherapy   Patient is on Treatment Plan : BREAST Pembrolizumab (400) D1 + Carboplatin (1.5) weekly + Paclitaxel (80) weekly q42d X 2  cycles / Pembrolizumab (400) D1 + AC D1,22 q42d x 2 cycles     03/18/2022 -  Chemotherapy   Patient is on Treatment Plan : BREAST Pembrolizumab (200) D1 + Carboplatin (2) D1,8,15 + Paclitaxel (45) D1,8,15 q21d X 2 cycles       CURRENT THERAPY: Pembrolizumab  INTERVAL HISTORY:  Jennifer Cowan 77 y.o. female returns for follow-up  Interval History  Since her last visit here, she is now back on weekly CarboTaxol with Keytruda given the increase in the bulk of the breast mass while she is off of chemotherapy. She denies any adverse effects with chemotherapy. She has good appetite, weight is maintaining well. She only complains of neuropathic pain from shingles, taking gabapentin. She denies any nausea, vomiting or diarrhea. No neuropathy. 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/16/2021   Type 2 diabetes mellitus with hyperglycemia, without long-term current use of insulin ([place]) 10/06/2021   Port-A-Cath in place 08/30/2021   Hyperglycemia 08/30/2021   Non-small cell cancer of left lung (Jennifer Cowan) 08/13/2021   Malignant neoplasm of unspecified part of unspecified bronchus or lung (Jennifer Cowan) 08/13/2021   Physical deconditioning 07/27/2021   Infiltrating duct carcinoma (Jennifer Cowan) 07/25/2021   Mass of upper lobe of left lung 07/25/2021   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  MEDICAL HISTORY: Past Medical History:  Diagnosis Date   Anemia    Arthritis    Breast cancer (Jennifer Cowan)     Cancer (Jennifer Cowan) 1999   Breast Cancer   Family history of breast cancer    Hyperlipidemia    Hypertension    Osteopenia 01/12/2021   Personal history of malignant neoplasm of breast     SURGICAL HISTORY: Past Surgical History:  Procedure Laterality Date   BREAST SURGERY  1999   Masectomy- left   BRONCHIAL BIOPSY  07/30/2021   Procedure: BRONCHIAL BIOPSIES;  Surgeon: Collene Gobble, MD;  Location: [facility] ENDOSCOPY;  Service: Pulmonary;;   BRONCHIAL BRUSHINGS  07/30/2021   Procedure: BRONCHIAL BRUSHINGS;  Surgeon: Collene Gobble, MD;  Location: [facility] ENDOSCOPY;  Service: Pulmonary;;   BRONCHIAL NEEDLE ASPIRATION BIOPSY  07/30/2021   Procedure: BRONCHIAL NEEDLE ASPIRATION BIOPSIES;  Surgeon: Collene Gobble, MD;  Location: [facility] ENDOSCOPY;  Service: Pulmonary;;   CYSTECTOMY     cyst removed off top of head   IR IMAGING GUIDED PORT INSERTION  08/20/2021   MASTECTOMY     VIDEO BRONCHOSCOPY WITH RADIAL ENDOBRONCHIAL ULTRASOUND  07/30/2021   Procedure: VIDEO BRONCHOSCOPY WITH RADIAL ENDOBRONCHIAL ULTRASOUND;  Surgeon: Collene Gobble, MD;  Location: [facility] ENDOSCOPY;  Service: Pulmonary;;    SOCIAL HISTORY: Social History   Socioeconomic History   Marital status: Single    Spouse name: Not on file   Number of children: 0   Years of education: 12   Highest education level: High school graduate  Occupational History   Occupation: retired  Tobacco Use   Smoking status: Never   Smokeless tobacco: Never  Vaping Use   Vaping Use: Never used  Substance and Sexual Activity   Alcohol use: No   Drug use: Never   Sexual activity: Yes    Birth control/protection: Post-menopausal  Other Topics Concern   Not on file  Social History Narrative   Not on file   Social Determinants of Health   Financial Resource Strain: Not on file  Food Insecurity: Not on file  Transportation Needs: Not on file  Physical Activity: Not on file  Stress: Not on file  Social Connections: Not on file  Intimate Partner  Violence: Not on file    FAMILY HISTORY: Family History  Problem Relation Age of Onset   Arthritis Mother    Breast cancer Sister 95   Diabetes Sister    Hyperlipidemia Sister    Hypertension Sister    Stroke Sister    Dementia Sister    Diabetes Brother    Hypertension Brother    Breast cancer Niece 49    Review of Systems  Constitutional:  Positive for fatigue. Negative for appetite change, chills, fever and unexpected weight change.  HENT:   Negative for hearing loss, lump/mass and trouble swallowing.   Eyes:  Negative for eye problems and icterus.  Respiratory:  Negative for chest tightness, cough and shortness of breath.   Cardiovascular:  Negative for chest pain, leg swelling and palpitations.  Gastrointestinal:  Negative for abdominal distention, abdominal pain, constipation, diarrhea, nausea and vomiting.  Endocrine: Negative for hot flashes.  Genitourinary:  Negative for difficulty urinating.   Musculoskeletal:  Positive for back pain. Negative for arthralgias.  Skin:  Negative for itching and rash.  Neurological:  Negative for dizziness, extremity weakness, headaches  and numbness.  Hematological:  Negative for adenopathy. Does not bruise/bleed easily.  Psychiatric/Behavioral:  Negative for depression. The patient is not nervous/anxious.       PHYSICAL EXAMINATION  ECOG PERFORMANCE STATUS: 1 - Symptomatic but completely ambulatory  There were no vitals filed for this visit.    Physical Exam Constitutional:      General: She is not in acute distress.    Appearance: Normal appearance. She is not toxic-appearing.  HENT:     Head: Normocephalic and atraumatic.  Eyes:     General: No scleral icterus. Cardiovascular:     Rate and Rhythm: Normal rate and regular rhythm.     Pulses: Normal pulses.     Heart sounds: Normal heart sounds.     Comments: Heart sounds clear and loud, no suggestion of large pericardial effusion. Pulmonary:     Effort: Pulmonary  effort is normal.     Breath sounds: Normal breath sounds.     Comments: No significant findings suggestive of effusion, if any this must be small Chest:     Comments: Right breast mass appears smaller. No palpable right axillary adenopathy Abdominal:     General: Abdomen is flat. Bowel sounds are normal. There is no distension.     Palpations: Abdomen is soft.     Tenderness: There is no abdominal tenderness.  Musculoskeletal:        General: No swelling.     Cervical back: Neck supple.  Lymphadenopathy:     Cervical: No cervical adenopathy.  Skin:    General: Skin is warm and dry.     Findings: No rash.  Neurological:     General: No focal deficit present.     Mental Status: She is alert.  Psychiatric:        Mood and Affect: Mood normal.        Behavior: Behavior normal.     LABORATORY DATA:  CBC    Component Value Date/Time   WBC 4.1 04/07/2022 0855   WBC 6.7 07/25/2021 0111   RBC 4.25 04/07/2022 0855   HGB 12.9 04/07/2022 0855   HGB 15.7 07/07/2021 1538   HCT 39.0 04/07/2022 0855   HCT 46.6 07/07/2021 1538   PLT 389 04/07/2022 0855   PLT 450 07/07/2021 1538   MCV 91.8 04/07/2022 0855   MCV 88 07/07/2021 1538   MCH 30.4 04/07/2022 0855   MCHC 33.1 04/07/2022 0855   RDW 14.9 04/07/2022 0855   RDW 12.9 07/07/2021 1538   LYMPHSABS 0.6 (L) 04/07/2022 0855   LYMPHSABS 1.6 07/07/2021 1538   MONOABS 0.5 04/07/2022 0855   EOSABS 0.1 04/07/2022 0855   EOSABS 0.1 07/07/2021 1538   BASOSABS 0.0 04/07/2022 0855   BASOSABS 0.0 07/07/2021 1538    CMP     Component Value Date/Time   NA 139 04/07/2022 0855   NA 141 07/07/2021 1538   K 3.9 04/07/2022 0855   CL 107 04/07/2022 0855   CO2 26 04/07/2022 0855   GLUCOSE 137 (H) 04/07/2022 0855   BUN 13 04/07/2022 0855   BUN 9 07/07/2021 1538   CREATININE 0.48 04/07/2022 0855   CALCIUM 9.2 04/07/2022 0855   PROT 6.4 (L) 04/07/2022 0855   PROT 7.9 07/07/2021 1538   ALBUMIN 3.7 04/07/2022 0855   ALBUMIN 4.6 07/07/2021  1538   AST 14 (L) 04/07/2022 0855   ALT 18 04/07/2022 0855   ALKPHOS 86 04/07/2022 0855   BILITOT 0.4 04/07/2022 0855   GFRNONAA >60 04/07/2022 [id number]  GFRAA 103 03/05/2020 1119    ASSESSMENT and THERAPY PLAN:   Malignant neoplasm of unspecified part of unspecified bronchus or lung ([place]) This is a 77 year old patient with 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  She also appears to have had some regional adenopathy including prevascular, AP window and left hilar adenopathy.  She has multiple intracranial metastatic lesions which are likely secondary to metastatic lung cancer given presence of regional adenopathy.   She is now status post SRS to the brain lesions. She started concurrent chemoradiation with CarboTaxol on August 30, 2021.  She completed chemoradiation on October 15, 2021.  End of treatment PET/CT reviewed consistent with response in the lung, likely postradiation changes noted. PET also commented on decrease in size of the breast mass.  We started noticing increasing size of the right breast mass while she was on immunotherapy alone.  We have restarted her on weekly CarboTaxol with immunotherapy.  If she does not have any evidence of progression or new disease from both breast cancer and lung cancer standpoint, we may represent her in the breast tumor board for role of surgery. At this time, however we are also investigating pericardial effusion, she is now monitored by Dr Rona Ravens to continue treatment as planned at this time. No changes since last visit.  Benay Pike MD   Total time: 20 min CBC from today with no concerns, CMP pending at the time of my visit.  Okay to change the infusion to Wednesday of next week for convenience.  *Total Encounter Time as defined by the Centers for Medicare and Medicaid Services includes, in addition to the face-to-face time of a patient visit  (documented in the note above) non-face-to-face time: obtaining and reviewing outside history, ordering and reviewing medications, tests or procedures, care coordination (communications with other health care professionals or caregivers) and documentation in the medical record.

## 2022-04-13 NOTE — Assessment & Plan Note (Addendum)
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  She also appears to have had some regional adenopathy including prevascular, AP window and left hilar adenopathy.  She has multiple intracranial metastatic lesions which are likely secondary to metastatic lung cancer given presence of regional adenopathy.   She is now status post SRS to the brain lesions. She started concurrent chemoradiation with CarboTaxol on August 30, 2021.  She completed chemoradiation on October 15, 2021.  End of treatment PET/CT reviewed consistent with response in the lung, likely postradiation changes noted. PET also commented on decrease in size of the breast mass.  We started noticing increasing size of the right breast mass while she was on immunotherapy alone.  We have restarted her on weekly CarboTaxol with immunotherapy.   Her right breast mass appears smaller on exam today.  She will continue the chemotherapy and will repeat imaging in second or third week of April for response assessment.  I have also ordered a PET/CT for systemic restaging. Neuropathic pain, will increase gabapentin to 300 mg 3 times a day.  She is currently on gabapentin 300 mg twice daily and she states this helps.Benay Pike MD

## 2022-04-15 ENCOUNTER — Inpatient Hospital Stay: Payer: 59

## 2022-04-15 ENCOUNTER — Inpatient Hospital Stay: Payer: 59 | Admitting: Hematology and Oncology

## 2022-04-19 ENCOUNTER — Ambulatory Visit (HOSPITAL_COMMUNITY)
Admission: RE | Admit: 2022-04-19 | Discharge: 2022-04-19 | Disposition: A | Discharge status: Home / Self Care | Payer: 59 | Source: Ambulatory Visit | Attending: Family Medicine | Admitting: Family Medicine

## 2022-04-19 ENCOUNTER — Ambulatory Visit (HOSPITAL_BASED_OUTPATIENT_CLINIC_OR_DEPARTMENT_OTHER)
Admission: RE | Admit: 2022-04-19 | Discharge: 2022-04-19 | Disposition: A | Discharge status: Home / Self Care | Payer: 59 | Source: Ambulatory Visit | Attending: Internal Medicine | Admitting: Internal Medicine

## 2022-04-19 ENCOUNTER — Encounter (HOSPITAL_COMMUNITY): Payer: Self-pay | Admitting: Internal Medicine

## 2022-04-19 VITALS — BP 110/60 | HR 84 | Wt 171.2 lb

## 2022-04-19 DIAGNOSIS — J90 Pleural effusion, not elsewhere classified: Secondary | ICD-10-CM | POA: Insufficient documentation

## 2022-04-19 DIAGNOSIS — Z0189 Encounter for other specified special examinations: Secondary | ICD-10-CM | POA: Diagnosis not present

## 2022-04-19 DIAGNOSIS — C3490 Malignant neoplasm of unspecified part of unspecified bronchus or lung: Secondary | ICD-10-CM | POA: Diagnosis not present

## 2022-04-19 DIAGNOSIS — I10 Essential (primary) hypertension: Secondary | ICD-10-CM | POA: Diagnosis not present

## 2022-04-19 DIAGNOSIS — I509 Heart failure, unspecified: Secondary | ICD-10-CM | POA: Diagnosis not present

## 2022-04-19 DIAGNOSIS — Z853 Personal history of malignant neoplasm of breast: Secondary | ICD-10-CM | POA: Diagnosis not present

## 2022-04-19 DIAGNOSIS — C3492 Malignant neoplasm of unspecified part of left bronchus or lung: Secondary | ICD-10-CM | POA: Diagnosis not present

## 2022-04-19 DIAGNOSIS — C7931 Secondary malignant neoplasm of brain: Secondary | ICD-10-CM | POA: Insufficient documentation

## 2022-04-19 DIAGNOSIS — I3139 Other pericardial effusion (noninflammatory): Secondary | ICD-10-CM | POA: Diagnosis not present

## 2022-04-19 DIAGNOSIS — I110 Hypertensive heart disease with heart failure: Secondary | ICD-10-CM | POA: Insufficient documentation

## 2022-04-19 LAB — ECHOCARDIOGRAM COMPLETE
Area-P 1/2: 3.05 cm2
S' Lateral: 2.6 cm
Single Plane A4C EF: 57.6 %

## 2022-04-19 NOTE — Progress Notes (Signed)
CARDIO-ONCOLOGY CLINIC CONSULT NOTE  Referring Physician: Dr. Chryl Heck Primary Care: [name], FNP Primary Cardiologist: New  HPI:  Jennifer Cowan is a 77 y.o. 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 Chryl Heck for further evaluation of her pericardial effusion.   Recent history is complicated.   In 7/23 diagnosed with right breast CA and left lung CA with mets to brain. Treated with Carboplatnin/Paclitaxel and Lung XRT. Also had SRS to brain lesions In 10/23 Keytruda added for treatment of lung CA.    CT 7/23 no pericardial effusion  Chest CT in 2/24 with left upper lung dense consolidation or atelectasis with nonvisualization of left upper lobe mass observed on the prior study, new large layering left-sided pleural effusion and moderate pericardial effusion, resolution of mediastinal and left axillary adenopathy consistent with therapy response, right breast mass demonstrating a partial response with decrease in size   On 03/16/22 started on breast chemo with Carboplatin (2) D1,8,15 + Paclitaxel (45) D1,8,15 q21d X 2 cycles     Echo 03/14/22: EF 65-70% small to moderate pericardial effusion mostly over RV  No tamponade  Referred for tap of left lung effusion but no significant fluid on u/s.   Here with her niece. Denies h/o heart disease. No SOB, orthopnea or PND. Recently with some swelling in her legs.    Review of Systems: [y] = yes, [ ]  = no   General: Weight gain [ ] ; Weight loss [ ] ; Anorexia [ ] ; Fatigue Blue.Reese ]; Fever [ ] ; Chills [ ] ; Weakness [ ]   Cardiac: Chest pain/pressure [ ] ; Resting SOB [ ] ; Exertional SOB [ ] ; Orthopnea [ ] ; Pedal Edema [y]; Palpitations [ ] ; Syncope [ ] ; Presyncope [ ] ; Paroxysmal nocturnal dyspnea[ ]   Pulmonary: Cough [ ] ; Wheezing[ ] ; Hemoptysis[ ] ; Sputum [ ] ; Snoring [ ]   GI: Vomiting[ ] ; Dysphagia[ ] ; Melena[ ] ; Hematochezia [ ] ; Heartburn[ ] ; Abdominal pain [ ] ; Constipation [ ] ; Diarrhea [ ] ; BRBPR [ ]    GU: Hematuria[ ] ; Dysuria [ ] ; Nocturia[ ]   Vascular: Pain in legs with walking [ ] ; Pain in feet with lying flat [ ] ; Non-healing sores [ ] ; Stroke [ ] ; TIA [ ] ; Slurred speech [ ] ;  Neuro: Headaches[ ] ; Vertigo[ ] ; Seizures[ ] ; Paresthesias[ ] ;Blurred vision [ ] ; Diplopia [ ] ; Vision changes [ ]   Ortho/Skin: Arthritis [ ] y; Joint pain Blue.Reese ]; Muscle pain [ ] ; Joint swelling [ ] ; Back Pain [ ] ; Rash [ ]   Psych: Depression[ ] ; Anxiety[ ]   Heme: Bleeding problems [ ] ; Clotting disorders [ ] ; Anemia [ y]  Endocrine: Diabetes [ ] ; Thyroid dysfunction[ ]    Past Medical History:  Diagnosis Date   Anemia    Arthritis    Breast cancer    Cancer 1999   Breast Cancer   Family history of breast cancer    Hyperlipidemia    Hypertension    Osteopenia 01/12/2021   Personal history of malignant neoplasm of breast     Current Outpatient Medications  Medication Sig Dispense Refill   Accu-Chek Softclix Lancets lancets Use to check blood sugar as directed 4 times a day 100 each 1   acetaminophen (TYLENOL) 500 MG tablet Take 1,000 mg by mouth every 6 (six) hours as needed for moderate pain.     amLODipine (NORVASC) 10 MG tablet TAKE 1 TABLET BY MOUTH DAILY 90 tablet 3   aspirin EC 325 MG tablet Take 1 tablet (325 mg  total) by mouth daily. 60 tablet 2   atorvastatin (LIPITOR) 80 MG tablet Take 1 tablet (80 mg total) by mouth daily. 90 tablet 3   benazepril (LOTENSIN) 10 MG tablet TAKE 1 TABLET BY MOUTH DAILY 90 tablet 3   blood glucose meter kit and supplies KIT Dispense based on patient and insurance preference. Use up to four times daily as directed. 1 each 0   Blood Glucose Monitoring Suppl (ACCU-CHEK GUIDE) w/Device KIT Use as directed 4 times a day 1 kit 0   Cholecalciferol (VITAMIN D3) 125 MCG (5000 UT) CAPS Take 5,000 Units by mouth daily.     gabapentin (NEURONTIN) 100 MG capsule Take 300 mg by mouth 2 (two) times daily.     glucose blood (ACCU-CHEK GUIDE) test strip Use as directed 4 times a  day 100 each 1   ketoconazole (NIZORAL) 2 % cream Apply 1 Application topically 2 (two) times daily.     LORazepam (ATIVAN) 0.5 MG tablet 1 tab po 30 minutes prior to radiation or MRI scans 30 tablet 0   meloxicam (MOBIC) 7.5 MG tablet TAKE 1 TABLET BY MOUTH DAILY 90 tablet 3   spironolactone (ALDACTONE) 25 MG tablet Take 0.5 tablets (12.5 mg total) by mouth daily. 45 tablet 3   trolamine salicylate (ASPERCREME) 10 % cream Apply 1 Application topically daily as needed for muscle pain.     Current Facility-Administered Medications  Medication Dose Route Frequency Provider Last Rate Last Admin   heparin lock flush 100 unit/mL  500 Units Intravenous Once Kyung Rudd, MD       sodium chloride flush (NS) 0.9 % injection 10 mL  10 mL Intravenous Once Kyung Rudd, MD       Facility-Administered Medications Ordered in Other Encounters  Medication Dose Route Frequency Provider Last Rate Last Admin   acetaminophen (TYLENOL) 325 MG tablet            cyanocobalamin (VITAMIN B12) 1000 MCG/ML injection            diphenhydrAMINE (BENADRYL) 25 mg capsule            gadopiclenol (VUEWAY) 0.5 MMOL/ML solution 7.5 mL  7.5 mL Intravenous Once PRN Tyler Pita, MD        No Known Allergies    Social History   Socioeconomic History   Marital status: Single    Spouse name: Not on file   Number of children: 0   Years of education: 12   Highest education level: High school graduate  Occupational History   Occupation: retired  Tobacco Use   Smoking status: Never   Smokeless tobacco: Never  Vaping Use   Vaping Use: Never used  Substance and Sexual Activity   Alcohol use: No   Drug use: Never   Sexual activity: Yes    Birth control/protection: Post-menopausal  Other Topics Concern   Not on file  Social History Narrative   Not on file   Social Determinants of Health   Financial Resource Strain: Not on file  Food Insecurity: Not on file  Transportation Needs: Not on file  Physical  Activity: Not on file  Stress: Not on file  Social Connections: Not on file  Intimate Partner Violence: Not on file      Family History  Problem Relation Age of Onset   Arthritis Mother    Breast cancer Sister 65   Diabetes Sister    Hyperlipidemia Sister    Hypertension Sister    Stroke Sister  Dementia Sister    Diabetes Brother    Hypertension Brother    Breast cancer Niece 31    Vitals:   04/19/22 1548  BP: 110/60  Pulse: 84  SpO2: 99%  Weight: 77.7 kg (171 lb 3.2 oz)    PHYSICAL EXAM: General:  Elderly. No respiratory difficulty HEENT: normal Neck: supple. JVP 7. Carotids 2+ bilat; no bruits. No lymphadenopathy or thryomegaly appreciated. Cor: PMI nondisplaced. Regular rate & rhythm. No rubs, gallops or murmurs. Lungs: clear Abdomen: soft, nontender, nondistended. No hepatosplenomegaly. No bruits or masses. Good bowel sounds. Extremities: no cyanosis, clubbing, rash, 1+ edema Neuro: alert & oriented x 3, cranial nerves grossly intact. moves all 4 extremities w/o difficulty. Affect pleasant.  ECG: NSR 96 No ST-T wave abnormalities. Low volts. No alternans Personally reviewed  ASSESSMENT & PLAN:  1. Pericardial Effusion, small to moderate  - I have reviewed echo images personally. Small to moderate pericardial effusion without tamponade.  There is likely not enough fluid to tap safely at this time - The diagnostic question here is if this is a malignant effusion or related to XRT or inflammation. Given that she already has stage IV cancer likely would not change cancer management - At this time will follow expectantly. Repeat echo in 4 weeks. If expanding will refer for pericardiocentesis or surgical window - we discussed signs/symptoms to watch for  2. Volume overload  - start spiro 12.5   3. HTN - mildly elevated  - start spiro 12.5   4. 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 Right breast CA - treatment as above  Glori Bickers, MD  4:20 PM

## 2022-04-19 NOTE — Patient Instructions (Signed)
There has been no changes to your medications.  Your physician has requested that you have an echocardiogram. Echocardiography is a painless test that uses sound waves to create images of your heart. It provides your doctor with information about the size and shape of your heart and how well your heart's chambers and valves are working. This procedure takes approximately one hour. There are no restrictions for this procedure. Please do NOT wear cologne, perfume, aftershave, or lotions (deodorant is allowed). Please arrive 15 minutes prior to your appointment time.  Your physician recommends that you schedule a follow-up appointment in: 3 months ( July ) ** please call the office in May to arrange your follow up appointment.**  If you have any questions or concerns before your next appointment please send [name] a message through [place] or call our office at [phone].    TO LEAVE A MESSAGE FOR THE NURSE SELECT OPTION 2, PLEASE LEAVE A MESSAGE INCLUDING: YOUR NAME DATE OF BIRTH CALL BACK NUMBER REASON FOR CALL**this is important as we prioritize the call backs  YOU WILL RECEIVE A CALL BACK THE SAME DAY AS LONG AS YOU CALL BEFORE 4:00 PM  At the [facility], you and your health needs are our priority. As part of our continuing mission to provide you with exceptional heart care, we have created designated Provider Care Teams. These Care Teams include your primary Cardiologist (physician) and Advanced Practice Providers (APPs- Physician Assistants and Nurse Practitioners) who all work together to provide you with the care you need, when you need it.   You may see any of the following providers on your designated Care Team at your next follow up: Dr Glori Bickers Dr Loralie Champagne Dr. Roxana Hires, NP Lyda Jester, [place] [facility] [place], [place] Forestine Na, NP Audry Riles, PharmD   Please be sure to bring in all your medications bottles to  every appointment.    Thank you for choosing [facility]

## 2022-04-19 NOTE — Progress Notes (Signed)
Echocardiogram 2D Echocardiogram has been performed.  Frances Furbish 04/19/2022, 3:45 PM

## 2022-04-21 MED FILL — Dexamethasone Sodium Phosphate Inj 100 MG/10ML: INTRAMUSCULAR | Qty: 1 | Status: AC

## 2022-04-21 NOTE — Progress Notes (Signed)
[place]  [phone] [phone]   Name: Jennifer Cowan Date: 04/21/2022 MRN: [id number]  DOB: 1945/05/26  Patient Care Team: [name], FNP as PCP - General (Family Medicine) Pickenpack-Cousar, Carlena Sax, NP as Nurse Practitioner (Nurse Practitioner)   INTERVAL HISTORY: Jennifer Cowan is a 77 y.o. 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 Palliative ask to see for symptom management and goals of care.   SOCIAL HISTORY:     reports that she has never smoked. She has never used smokeless tobacco. She reports that she does not drink alcohol and does not use drugs.  ADVANCE DIRECTIVES:  Patient does not have an advanced directive however is interested in completing.   CODE STATUS:   PAST MEDICAL HISTORY: Past Medical History:  Diagnosis Date   Anemia    Arthritis    Breast cancer    Cancer 1999   Breast Cancer   Family history of breast cancer    Hyperlipidemia    Hypertension    Osteopenia 01/12/2021   Personal history of malignant neoplasm of breast     ALLERGIES:  has No Known Allergies.  MEDICATIONS:  Current Outpatient Medications  Medication Sig Dispense Refill   Accu-Chek Softclix Lancets lancets Use to check blood sugar as directed 4 times a day 100 each 1   acetaminophen (TYLENOL) 500 MG tablet Take 1,000 mg by mouth every 6 (six) hours as needed for moderate pain.     amLODipine (NORVASC) 10 MG tablet TAKE 1 TABLET BY MOUTH DAILY 90 tablet 3   aspirin EC 325 MG tablet Take 1 tablet (325 mg total) by mouth daily. 60 tablet 2   atorvastatin (LIPITOR) 80 MG tablet Take 1 tablet (80 mg total) by mouth daily. 90 tablet 3   benazepril (LOTENSIN) 10 MG tablet TAKE 1 TABLET BY MOUTH DAILY 90 tablet 3   blood glucose meter kit and supplies KIT  Dispense based on patient and insurance preference. Use up to four times daily as directed. 1 each 0   Blood Glucose Monitoring Suppl (ACCU-CHEK GUIDE) w/Device KIT Use as directed 4 times a day 1 kit 0   Cholecalciferol (VITAMIN D3) 125 MCG (5000 UT) CAPS Take 5,000 Units by mouth daily.     gabapentin (NEURONTIN) 100 MG capsule Take 300 mg by mouth 2 (two) times daily.     glucose blood (ACCU-CHEK GUIDE) test strip Use as directed 4 times a day 100 each 1   ketoconazole (NIZORAL) 2 % cream Apply 1 Application topically 2 (two) times daily.     LORazepam (ATIVAN) 0.5 MG tablet 1 tab po 30 minutes prior to radiation or MRI scans 30 tablet 0   meloxicam (MOBIC) 7.5 MG tablet TAKE 1 TABLET BY MOUTH DAILY 90 tablet 3   spironolactone (ALDACTONE) 25 MG tablet Take 0.5 tablets (12.5 mg total) by mouth daily. 45 tablet 3   trolamine salicylate (ASPERCREME) 10 % cream Apply 1 Application topically daily as needed for muscle pain.     Current Facility-Administered Medications  Medication Dose Route Frequency Provider Last Rate Last Admin   heparin lock flush 100 unit/mL  500 Units Intravenous Once Kyung Rudd, MD       sodium chloride flush (NS) 0.9 % injection 10 mL  10 mL Intravenous Once Kyung Rudd, MD       Facility-Administered  Medications Ordered in Other Visits  Medication Dose Route Frequency Provider Last Rate Last Admin   acetaminophen (TYLENOL) 325 MG tablet            cyanocobalamin (VITAMIN B12) 1000 MCG/ML injection            diphenhydrAMINE (BENADRYL) 25 mg capsule            gadopiclenol (VUEWAY) 0.5 MMOL/ML solution 7.5 mL  7.5 mL Intravenous Once PRN Margaretmary Dys, MD        VITAL SIGNS: There were no vitals taken for this visit. There were no vitals filed for this visit.   Estimated body mass index is 26.03 kg/m as calculated from the following:   Height as of 04/13/22: 5\' 8"  (1.727 m).   Weight as of 04/19/22: 171 lb 3.2 oz (77.7 kg).   PERFORMANCE STATUS (ECOG) : 2 -  Symptomatic, <50% confined to bed   Physical Exam General: NAD, sitting in recliner  Cardiovascular: regular rate and rhythm Pulmonary: normal breathing pattern  Extremities: no edema, no joint deformities Skin: no rashes Neurological: AAO x3, mood appropriate   IMPRESSION: I saw Jennifer Cowan during her infusion. No acute distress noted. Daughter present. Continues to do well. Denies nausea, vomiting, constipation, or diarrhea.   Minimal pain at this time. Her appetite fluctuates. Some days better than others. Weight today is 170lbs from 172lbs on 3/1.   No symptom management needs at this time. Patient and family knows to contact our office as needed.   PLAN: Ongoing goals of care and support. Continues to improve.  Lidocaine patch as needed No symptom needs at this time.  Will plan to follow-up in 6-8 weeks in collaboration with other oncology appointments.    Patient expressed understanding and was in agreement with this plan. She also understands that She can call the clinic at any time with any questions, concerns, or complaints.     Any controlled substances utilized were prescribed in the context of palliative care. PDMP has been reviewed.    Visit consisted of counseling and education dealing with the complex and emotionally intense issues of symptom management and palliative care in the setting of serious and potentially life-threatening illness.Greater than 50%  of this time was spent counseling and coordinating care related to the above assessment and plan.  Jennifer Cowan, AGPCNP-BC  Palliative Medicine Team/[facility]  *Please note that this is a verbal dictation therefore any spelling or grammatical errors are due to the "[employer]agon [employer] One" system interpretation.

## 2022-04-22 ENCOUNTER — Inpatient Hospital Stay: Payer: 59

## 2022-04-22 ENCOUNTER — Inpatient Hospital Stay (HOSPITAL_BASED_OUTPATIENT_CLINIC_OR_DEPARTMENT_OTHER): Payer: 59 | Admitting: Nurse Practitioner

## 2022-04-22 ENCOUNTER — Encounter: Payer: Self-pay | Admitting: Nurse Practitioner

## 2022-04-22 ENCOUNTER — Inpatient Hospital Stay: Payer: 59 | Attending: Hematology and Oncology

## 2022-04-22 ENCOUNTER — Inpatient Hospital Stay (HOSPITAL_BASED_OUTPATIENT_CLINIC_OR_DEPARTMENT_OTHER): Payer: 59 | Admitting: Hematology and Oncology

## 2022-04-22 VITALS — BP 124/57 | HR 94 | Resp 16

## 2022-04-22 DIAGNOSIS — C3490 Malignant neoplasm of unspecified part of unspecified bronchus or lung: Secondary | ICD-10-CM | POA: Diagnosis not present

## 2022-04-22 DIAGNOSIS — Z7189 Other specified counseling: Secondary | ICD-10-CM | POA: Diagnosis not present

## 2022-04-22 DIAGNOSIS — C7931 Secondary malignant neoplasm of brain: Secondary | ICD-10-CM | POA: Insufficient documentation

## 2022-04-22 DIAGNOSIS — C3412 Malignant neoplasm of upper lobe, left bronchus or lung: Secondary | ICD-10-CM | POA: Diagnosis not present

## 2022-04-22 DIAGNOSIS — R630 Anorexia: Secondary | ICD-10-CM | POA: Diagnosis not present

## 2022-04-22 DIAGNOSIS — Z5111 Encounter for antineoplastic chemotherapy: Secondary | ICD-10-CM | POA: Diagnosis not present

## 2022-04-22 DIAGNOSIS — Z853 Personal history of malignant neoplasm of breast: Secondary | ICD-10-CM | POA: Diagnosis not present

## 2022-04-22 DIAGNOSIS — Z923 Personal history of irradiation: Secondary | ICD-10-CM | POA: Diagnosis not present

## 2022-04-22 DIAGNOSIS — Z515 Encounter for palliative care: Secondary | ICD-10-CM

## 2022-04-22 DIAGNOSIS — Z171 Estrogen receptor negative status [ER-]: Secondary | ICD-10-CM | POA: Insufficient documentation

## 2022-04-22 DIAGNOSIS — Z803 Family history of malignant neoplasm of breast: Secondary | ICD-10-CM | POA: Diagnosis not present

## 2022-04-22 DIAGNOSIS — Z95828 Presence of other vascular implants and grafts: Secondary | ICD-10-CM

## 2022-04-22 DIAGNOSIS — R739 Hyperglycemia, unspecified: Secondary | ICD-10-CM

## 2022-04-22 LAB — CBC WITH DIFFERENTIAL (CANCER CENTER ONLY)
Abs Immature Granulocytes: 0.01 10*3/uL (ref 0.00–0.07)
Basophils Absolute: 0 10*3/uL (ref 0.0–0.1)
Basophils Relative: 1 %
Eosinophils Absolute: 0.1 10*3/uL (ref 0.0–0.5)
Eosinophils Relative: 2 %
HCT: 38.6 % (ref 36.0–46.0)
Hemoglobin: 12.9 g/dL (ref 12.0–15.0)
Immature Granulocytes: 0 %
Lymphocytes Relative: 15 %
Lymphs Abs: 0.6 10*3/uL — ABNORMAL LOW (ref 0.7–4.0)
MCH: 30.9 pg (ref 26.0–34.0)
MCHC: 33.4 g/dL (ref 30.0–36.0)
MCV: 92.6 fL (ref 80.0–100.0)
Monocytes Absolute: 0.6 10*3/uL (ref 0.1–1.0)
Monocytes Relative: 16 %
Neutro Abs: 2.7 10*3/uL (ref 1.7–7.7)
Neutrophils Relative %: 66 %
Platelet Count: 295 10*3/uL (ref 150–400)
RBC: 4.17 MIL/uL (ref 3.87–5.11)
RDW: 16.4 % — ABNORMAL HIGH (ref 11.5–15.5)
WBC Count: 4.1 10*3/uL (ref 4.0–10.5)
nRBC: 0 % (ref 0.0–0.2)

## 2022-04-22 LAB — CMP (CANCER CENTER ONLY)
ALT: 18 U/L (ref 0–44)
AST: 14 U/L — ABNORMAL LOW (ref 15–41)
Albumin: 3.7 g/dL (ref 3.5–5.0)
Alkaline Phosphatase: 83 U/L (ref 38–126)
Anion gap: 6 (ref 5–15)
BUN: 15 mg/dL (ref 8–23)
CO2: 24 mmol/L (ref 22–32)
Calcium: 9.5 mg/dL (ref 8.9–10.3)
Chloride: 110 mmol/L (ref 98–111)
Creatinine: 0.58 mg/dL (ref 0.44–1.00)
GFR, Estimated: >60 mL/min
Glucose, Bld: 120 mg/dL — ABNORMAL HIGH (ref 70–99)
Potassium: 4 mmol/L (ref 3.5–5.1)
Sodium: 140 mmol/L (ref 135–145)
Total Bilirubin: 0.3 mg/dL (ref 0.3–1.2)
Total Protein: 6.6 g/dL (ref 6.5–8.1)

## 2022-04-22 MED ORDER — SODIUM CHLORIDE 0.9 % IV SOLN
45.0000 mg/m2 | Freq: Once | INTRAVENOUS | Status: AC
  Administered 2022-04-22: 84 mg via INTRAVENOUS
  Filled 2022-04-22: qty 14

## 2022-04-22 MED ORDER — DIPHENHYDRAMINE HCL 50 MG/ML IJ SOLN
50.0000 mg | Freq: Once | INTRAMUSCULAR | Status: AC
  Administered 2022-04-22: 50 mg via INTRAVENOUS
  Filled 2022-04-22: qty 1

## 2022-04-22 MED ORDER — SODIUM CHLORIDE 0.9 % IV SOLN: Freq: Once | INTRAVENOUS | Status: AC

## 2022-04-22 MED ORDER — SODIUM CHLORIDE 0.9 % IV SOLN
10.0000 mg | Freq: Once | INTRAVENOUS | Status: AC
  Administered 2022-04-22: 10 mg via INTRAVENOUS
  Filled 2022-04-22: qty 10

## 2022-04-22 MED ORDER — SODIUM CHLORIDE 0.9% FLUSH
10.0000 mL | Freq: Once | INTRAVENOUS | Status: AC
  Administered 2022-04-22: 10 mL

## 2022-04-22 MED ORDER — PALONOSETRON HCL INJECTION 0.25 MG/5ML
0.2500 mg | Freq: Once | INTRAVENOUS | Status: AC
  Administered 2022-04-22: 0.25 mg via INTRAVENOUS
  Filled 2022-04-22: qty 5

## 2022-04-22 MED ORDER — FAMOTIDINE IN NACL 20-0.9 MG/50ML-% IV SOLN
20.0000 mg | Freq: Once | INTRAVENOUS | Status: AC
  Administered 2022-04-22: 20 mg via INTRAVENOUS
  Filled 2022-04-22: qty 50

## 2022-04-22 MED ORDER — HEPARIN SOD (PORK) LOCK FLUSH 100 UNIT/ML IV SOLN
500.0000 [IU] | Freq: Once | INTRAVENOUS | Status: AC | PRN
  Administered 2022-04-22: 500 [IU]

## 2022-04-22 MED ORDER — SODIUM CHLORIDE 0.9 % IV SOLN
167.2000 mg | Freq: Once | INTRAVENOUS | Status: AC
  Administered 2022-04-22: 170 mg via INTRAVENOUS
  Filled 2022-04-22: qty 17

## 2022-04-22 MED ORDER — SODIUM CHLORIDE 0.9% FLUSH
10.0000 mL | INTRAVENOUS | Status: DC | PRN
  Administered 2022-04-22: 10 mL

## 2022-04-22 NOTE — Progress Notes (Signed)
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  The report date is February 27, 2022.  The CancerNext-Expanded gene panel offered by [facility] and includes sequencing and rearrangement analysis for the following 77 genes: AIP, ALK, APC*, ATM*, AXIN2, BAP1, BARD1, BLM, BMPR1A, BRCA1*, BRCA2*, BRIP1*, CDC73, CDH1*, CDK4, CDKN1B, CDKN2A, CHEK2*, CTNNA1, DICER1, FANCC, FH, FLCN, GALNT12, KIF1B, LZTR1, MAX, MEN1, MET, MLH1*, MSH2*, MSH3, MSH6*, MUTYH*, NBN, NF1*, NF2, NTHL1, PALB2*, PHOX2B, PMS2*, POT1, PRKAR1A, PTCH1, PTEN*, RAD51C*, RAD51D*, RB1, RECQL, RET, SDHA, SDHAF2, SDHB, SDHC, SDHD, SMAD4, SMARCA4, SMARCB1, SMARCE1, STK11, SUFU, TMEM127, TP53*, TSC1, TSC2, VHL and XRCC2 (sequencing and deletion/duplication); EGFR, EGLN1, HOXB13, KIT, MITF, PDGFRA, POLD1, and POLE (sequencing only); EPCAM and GREM1 (deletion/duplication only). DNA and RNA analyses performed for * genes.    Malignant neoplasm of unspecified part of unspecified bronchus or lung  08/13/2021 Initial Diagnosis   Malignant neoplasm of unspecified part of unspecified bronchus or lung (HCC)   08/30/2021 - 09/13/2021 Chemotherapy   Patient is on Treatment Plan : LUNG Carboplatin / Paclitaxel + XRT q7d     08/30/2021 - 10/11/2021 Chemotherapy   Patient is on Treatment Plan : LUNG Carboplatin + Paclitaxel + XRT q7d     11/16/2021 - 02/17/2022 Chemotherapy   Patient is on Treatment Plan : LUNG Pembrolizumab Q 21D     03/16/2022 - 03/16/2022 Chemotherapy   Patient is on Treatment Plan : BREAST Pembrolizumab (400) D1 + Carboplatin (1.5) weekly + Paclitaxel (80) weekly q42d X 2 cycles /  Pembrolizumab (400) D1 + AC D1,22 q42d x 2 cycles     03/18/2022 -  Chemotherapy   Patient is on Treatment Plan : BREAST Pembrolizumab (200) D1 + Carboplatin (2) D1,8,15 + Paclitaxel (45) D1,8,15 q21d X 2 cycles       CURRENT THERAPY: Pembrolizumab  INTERVAL HISTORY:  CHANTELL LAWRY 77 y.o. female returns for follow-up  Interval History  Since her last visit here, she is now back on weekly CarboTaxol with Keytruda given the increase in the bulk of the breast mass while she is off of chemotherapy. She denies any adverse effects with chemotherapy.  She answered in short phrases, she says she is is tired and is not a morning person. She denies any nausea, vomiting or diarrhea. No neuropathy. 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  MEDICAL HISTORY: Past Medical History:  Diagnosis Date   Anemia    Arthritis    Breast cancer    Cancer 1999   Breast Cancer   Family history of breast cancer    Hyperlipidemia    Hypertension     Osteopenia 01/12/2021   Personal history of malignant neoplasm of breast     SURGICAL HISTORY: Past Surgical History:  Procedure Laterality Date   BREAST SURGERY  1999   Masectomy- left   BRONCHIAL BIOPSY  07/30/2021   Procedure: BRONCHIAL BIOPSIES;  Surgeon: Leslye PeerByrum, Robert S, MD;  [facility];  Service: Pulmonary;;   BRONCHIAL BRUSHINGS  07/30/2021   Procedure: BRONCHIAL BRUSHINGS;  Surgeon: Leslye PeerByrum, Robert S, MD;  [facility];  Service: Pulmonary;;   BRONCHIAL NEEDLE ASPIRATION BIOPSY  07/30/2021   Procedure: BRONCHIAL NEEDLE ASPIRATION BIOPSIES;  Surgeon: Leslye PeerByrum, Robert S, MD;  Location: [facility] ENDOSCOPY;  Service: Pulmonary;;   CYSTECTOMY     cyst removed off top of head   IR IMAGING GUIDED PORT INSERTION  08/20/2021   MASTECTOMY     VIDEO BRONCHOSCOPY WITH RADIAL ENDOBRONCHIAL ULTRASOUND  07/30/2021   Procedure: VIDEO BRONCHOSCOPY WITH RADIAL ENDOBRONCHIAL ULTRASOUND;  Surgeon: Leslye PeerByrum, Robert S, MD;  Location: [facility] ENDOSCOPY;  Service: Pulmonary;;    SOCIAL HISTORY: Social History   Socioeconomic History   Marital status: Single    Spouse name: Not on file   Number of children: 0   Years of education: 12   Highest education level: High school graduate  Occupational History   Occupation: retired  Tobacco Use   Smoking status: Never   Smokeless tobacco: Never  Vaping Use   Vaping Use: Never used  Substance and Sexual Activity   Alcohol use: No   Drug use: Never   Sexual activity: Yes    Birth control/protection: Post-menopausal  Other Topics Concern   Not on file  Social History Narrative   Not on file   Social Determinants of Health   Financial Resource Strain: Not on file  Food Insecurity: Not on file  Transportation Needs: Not on file  Physical Activity: Not on file  Stress: Not on file  Social Connections: Not on file  Intimate Partner Violence: Not on file    FAMILY HISTORY: Family History  Problem Relation Age of Onset   Arthritis Mother     Breast cancer Sister [id number]   Diabetes Sister    Hyperlipidemia Sister    Hypertension Sister    Stroke Sister    Dementia Sister    Diabetes Brother    Hypertension Brother    Breast cancer Niece 2050    Review of Systems  Constitutional:  Positive for fatigue. Negative for appetite change, chills, fever and unexpected weight change.  HENT:   Negative for hearing loss, lump/mass and trouble swallowing.   Eyes:  Negative for eye problems and icterus.  Respiratory:  Negative for chest tightness, cough and shortness of breath.   Cardiovascular:  Negative for chest pain, leg swelling and palpitations.  Gastrointestinal:  Negative for abdominal distention, abdominal pain, constipation, diarrhea, nausea and vomiting.  Endocrine: Negative for hot flashes.  Genitourinary:  Negative for difficulty urinating.   Musculoskeletal:  Positive for back pain. Negative for arthralgias.  Skin:  Negative for itching and rash.  Neurological:  Negative for dizziness, extremity weakness, headaches and numbness.  Hematological:  Negative for adenopathy. Does not  bruise/bleed easily.  Psychiatric/Behavioral:  Negative for depression. The patient is not nervous/anxious.       PHYSICAL EXAMINATION  ECOG PERFORMANCE STATUS: 1 - Symptomatic but completely ambulatory  Vitals:   04/22/22 0941  BP: (!) 144/56  Pulse: (!) 102  Resp: 16  Temp: 97.7 F (36.5 C)  SpO2: 98%      Physical Exam Constitutional:      General: She is not in acute distress.    Appearance: Normal appearance. She is not toxic-appearing.  HENT:     Head: Normocephalic and atraumatic.  Eyes:     General: No scleral icterus. Cardiovascular:     Rate and Rhythm: Normal rate and regular rhythm.     Pulses: Normal pulses.     Heart sounds: Normal heart sounds.     Comments: Heart sounds clear and loud, no suggestion of large pericardial effusion. Pulmonary:     Effort: Pulmonary effort is normal.     Breath sounds: Normal  breath sounds.     Comments: Diminished air entry right lower lobe. Chest:     Comments: Right breast mass appears smaller. No palpable right axillary adenopathy Abdominal:     General: Abdomen is flat. Bowel sounds are normal. There is no distension.     Palpations: Abdomen is soft.     Tenderness: There is no abdominal tenderness.  Musculoskeletal:        General: No swelling.     Cervical back: Neck supple.  Lymphadenopathy:     Cervical: No cervical adenopathy.  Skin:    General: Skin is warm and dry.     Findings: No rash.  Neurological:     General: No focal deficit present.     Mental Status: She is alert.  Psychiatric:        Mood and Affect: Mood normal.        Behavior: Behavior normal.     LABORATORY DATA:  CBC    Component Value Date/Time   WBC 4.1 04/22/2022 0919   WBC 6.7 07/25/2021 0111   RBC 4.17 04/22/2022 0919   HGB 12.9 04/22/2022 0919   HGB 15.7 07/07/2021 1538   HCT 38.6 04/22/2022 0919   HCT 46.6 07/07/2021 1538   PLT 295 04/22/2022 0919   PLT 450 07/07/2021 1538   MCV 92.6 04/22/2022 0919   MCV 88 07/07/2021 1538   MCH 30.9 04/22/2022 0919   MCHC 33.4 04/22/2022 0919   RDW 16.4 (H) 04/22/2022 0919   RDW 12.9 07/07/2021 1538   LYMPHSABS 0.6 (L) 04/22/2022 0919   LYMPHSABS 1.6 07/07/2021 1538   MONOABS 0.6 04/22/2022 0919   EOSABS 0.1 04/22/2022 0919   EOSABS 0.1 07/07/2021 1538   BASOSABS 0.0 04/22/2022 0919   BASOSABS 0.0 07/07/2021 1538    CMP     Component Value Date/Time   NA 140 04/22/2022 0919   NA 141 07/07/2021 1538   K 4.0 04/22/2022 0919   CL 110 04/22/2022 0919   CO2 24 04/22/2022 0919   GLUCOSE 120 (H) 04/22/2022 0919   BUN 15 04/22/2022 0919   BUN 9 07/07/2021 1538   CREATININE 0.58 04/22/2022 0919   CALCIUM 9.5 04/22/2022 0919   PROT 6.6 04/22/2022 0919   PROT 7.9 07/07/2021 1538   ALBUMIN 3.7 04/22/2022 0919   ALBUMIN 4.6 07/07/2021 1538   AST 14 (L) 04/22/2022 0919   ALT 18 04/22/2022 0919   ALKPHOS 83  04/22/2022 0919   BILITOT 0.3 04/22/2022 0919   GFRNONAA >60  04/22/2022 0919   GFRAA 103 03/05/2020 1119    ASSESSMENT and THERAPY PLAN:   Malignant neoplasm of unspecified part of unspecified bronchus or lung (HCC) This is a 76 year old patient with 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  She also appears to have had some regional adenopathy including prevascular, AP window and left hilar adenopathy.  She has multiple intracranial metastatic lesions which are likely secondary to metastatic lung cancer given presence of regional adenopathy.   She is now status post SRS to the brain lesions. She started concurrent chemoradiation with CarboTaxol on August 30, 2021.  She completed chemoradiation on October 15, 2021.  End of treatment PET/CT reviewed consistent with response in the lung, likely postradiation changes noted. PET also commented on decrease in size of the breast mass.  We started noticing increasing size of the right breast mass while she was on immunotherapy alone.  We have restarted her on weekly CarboTaxol with immunotherapy.   Her right breast mass appears smaller on exam today.  She will continue the chemotherapy and will repeat imaging in second or third week of April for response assessment.   No adverse effects reported. No concerns on exam otherwise. RTC after scans. Rachel Moulds MD   Total time: 30 min CBC from today with no concerns, CMP pending at the time of my visit.  Okay to change the infusion to Wednesday of next week for convenience.  *Total Encounter Time as defined by the Centers for Medicare and Medicaid Services includes, in addition to the face-to-face time of a patient visit (documented in the note above) non-face-to-face time: obtaining and reviewing outside history, ordering and reviewing medications, tests or procedures, care coordination (communications with other health  care professionals or caregivers) and documentation in the medical record.

## 2022-04-22 NOTE — Assessment & Plan Note (Signed)
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  She also appears to have had some regional adenopathy including prevascular, AP window and left hilar adenopathy.  She has multiple intracranial metastatic lesions which are likely secondary to metastatic lung cancer given presence of regional adenopathy.   She is now status post SRS to the brain lesions. She started concurrent chemoradiation with CarboTaxol on August 30, 2021.  She completed chemoradiation on October 15, 2021.  End of treatment PET/CT reviewed consistent with response in the lung, likely postradiation changes noted. PET also commented on decrease in size of the breast mass.  We started noticing increasing size of the right breast mass while she was on immunotherapy alone.  We have restarted her on weekly CarboTaxol with immunotherapy.   Her right breast mass appears smaller on exam today.  She will continue the chemotherapy and will repeat imaging in second or third week of April for response assessment.   No adverse effects reported. No concerns on exam otherwise. RTC after scans. Rachel Moulds MD

## 2022-04-22 NOTE — Patient Instructions (Signed)
[facility]  Discharge Instructions: Thank you for choosing [facility] to provide your oncology and hematology care.   If you have a lab appointment with the Cancer Center, please go directly to the Cancer Center and check in at the registration area.   Wear comfortable clothing and clothing appropriate for easy access to any Portacath or PICC line.   We strive to give you quality time with your provider. You may need to reschedule your appointment if you arrive late (15 or more minutes).  Arriving late affects you and other patients whose appointments are after yours.  Also, if you miss three or more appointments without notifying the office, you may be dismissed from the clinic at the provider's discretion.      For prescription refill requests, have your pharmacy contact our office and allow 72 hours for refills to be completed.    Today you received the following chemotherapy and/or immunotherapy agents: Paclitaxel, Carboplatin.       To help prevent nausea and vomiting after your treatment, we encourage you to take your nausea medication as directed.  BELOW ARE SYMPTOMS THAT SHOULD BE REPORTED IMMEDIATELY: *FEVER GREATER THAN 100.4 F (38 C) OR HIGHER *CHILLS OR SWEATING *NAUSEA AND VOMITING THAT IS NOT CONTROLLED WITH YOUR NAUSEA MEDICATION *UNUSUAL SHORTNESS OF BREATH *UNUSUAL BRUISING OR BLEEDING *URINARY PROBLEMS (pain or burning when urinating, or frequent urination) *BOWEL PROBLEMS (unusual diarrhea, constipation, pain near the anus) TENDERNESS IN MOUTH AND THROAT WITH OR WITHOUT PRESENCE OF ULCERS (sore throat, sores in mouth, or a toothache) UNUSUAL RASH, SWELLING OR PAIN  UNUSUAL VAGINAL DISCHARGE OR ITCHING   Items with * indicate a potential emergency and should be followed up as soon as possible or go to the Emergency Department if any problems should occur.  Please show the CHEMOTHERAPY ALERT CARD or IMMUNOTHERAPY  ALERT CARD at check-in to the Emergency Department and triage nurse.  Should you have questions after your visit or need to cancel or reschedule your appointment, please contact [facility]  Dept: [phone]  and follow the prompts.  Office hours are 8:00 a.m. to 4:30 p.m. Monday - Friday. Please note that voicemails left after 4:00 p.m. may not be returned until the following business day.  We are closed weekends and major holidays. You have access to a nurse at all times for urgent questions. Please call the main number to the clinic Dept: [phone] and follow the prompts.   For any non-urgent questions, you may also contact your provider using MyChart. We now offer e-Visits for anyone 18 and older to request care online for non-urgent symptoms. For details visit [email or web address].   Also download the MyChart app! Go to the app store, search "MyChart", open the app, select [facility], and log in with your MyChart username and password.   

## 2022-04-25 ENCOUNTER — Other Ambulatory Visit: Payer: Self-pay | Admitting: Family Medicine

## 2022-04-28 MED FILL — Dexamethasone Sodium Phosphate Inj 100 MG/10ML: INTRAMUSCULAR | Qty: 1 | Status: AC

## 2022-04-29 ENCOUNTER — Ambulatory Visit: Payer: 59 | Admitting: Physician Assistant

## 2022-04-29 ENCOUNTER — Inpatient Hospital Stay: Payer: 59

## 2022-04-29 ENCOUNTER — Other Ambulatory Visit: Payer: Self-pay

## 2022-04-29 VITALS — BP 136/83 | HR 100 | Temp 97.6°F | Resp 16 | Wt 171.0 lb

## 2022-04-29 DIAGNOSIS — C7931 Secondary malignant neoplasm of brain: Secondary | ICD-10-CM | POA: Diagnosis not present

## 2022-04-29 DIAGNOSIS — Z95828 Presence of other vascular implants and grafts: Secondary | ICD-10-CM

## 2022-04-29 DIAGNOSIS — Z853 Personal history of malignant neoplasm of breast: Secondary | ICD-10-CM | POA: Diagnosis not present

## 2022-04-29 DIAGNOSIS — C3412 Malignant neoplasm of upper lobe, left bronchus or lung: Secondary | ICD-10-CM | POA: Diagnosis not present

## 2022-04-29 DIAGNOSIS — C3490 Malignant neoplasm of unspecified part of unspecified bronchus or lung: Secondary | ICD-10-CM

## 2022-04-29 DIAGNOSIS — Z803 Family history of malignant neoplasm of breast: Secondary | ICD-10-CM | POA: Diagnosis not present

## 2022-04-29 DIAGNOSIS — Z923 Personal history of irradiation: Secondary | ICD-10-CM | POA: Diagnosis not present

## 2022-04-29 DIAGNOSIS — R739 Hyperglycemia, unspecified: Secondary | ICD-10-CM

## 2022-04-29 DIAGNOSIS — Z5111 Encounter for antineoplastic chemotherapy: Secondary | ICD-10-CM | POA: Diagnosis not present

## 2022-04-29 DIAGNOSIS — Z171 Estrogen receptor negative status [ER-]: Secondary | ICD-10-CM | POA: Diagnosis not present

## 2022-04-29 LAB — CMP (CANCER CENTER ONLY)
ALT: 21 U/L (ref 0–44)
AST: 17 U/L (ref 15–41)
Albumin: 3.9 g/dL (ref 3.5–5.0)
Alkaline Phosphatase: 89 U/L (ref 38–126)
Anion gap: 5 (ref 5–15)
BUN: 20 mg/dL (ref 8–23)
CO2: 25 mmol/L (ref 22–32)
Calcium: 9.7 mg/dL (ref 8.9–10.3)
Chloride: 109 mmol/L (ref 98–111)
Creatinine: 0.59 mg/dL (ref 0.44–1.00)
GFR, Estimated: >60 mL/min (ref >60)
Glucose, Bld: 139 mg/dL — ABNORMAL HIGH (ref 70–99)
Potassium: 4.1 mmol/L (ref 3.5–5.1)
Sodium: 139 mmol/L (ref 135–145)
Total Bilirubin: 0.4 mg/dL (ref 0.3–1.2)
Total Protein: 6.9 g/dL (ref 6.5–8.1)

## 2022-04-29 LAB — CBC WITH DIFFERENTIAL (CANCER CENTER ONLY)
Abs Immature Granulocytes: 0.02 10*3/uL (ref 0.00–0.07)
Basophils Absolute: 0 10*3/uL (ref 0.0–0.1)
Basophils Relative: 1 %
Eosinophils Absolute: 0.1 10*3/uL (ref 0.0–0.5)
Eosinophils Relative: 2 %
HCT: 39.8 % (ref 36.0–46.0)
Hemoglobin: 13.3 g/dL (ref 12.0–15.0)
Immature Granulocytes: 1 %
Lymphocytes Relative: 13 %
Lymphs Abs: 0.6 10*3/uL — ABNORMAL LOW (ref 0.7–4.0)
MCH: 30.9 pg (ref 26.0–34.0)
MCHC: 33.4 g/dL (ref 30.0–36.0)
MCV: 92.3 fL (ref 80.0–100.0)
Monocytes Absolute: 0.5 10*3/uL (ref 0.1–1.0)
Monocytes Relative: 12 %
Neutro Abs: 3 10*3/uL (ref 1.7–7.7)
Neutrophils Relative %: 71 %
Platelet Count: 290 10*3/uL (ref 150–400)
RBC: 4.31 MIL/uL (ref 3.87–5.11)
RDW: 16.4 % — ABNORMAL HIGH (ref 11.5–15.5)
WBC Count: 4.1 10*3/uL (ref 4.0–10.5)
nRBC: 0 % (ref 0.0–0.2)

## 2022-04-29 MED ORDER — SODIUM CHLORIDE 0.9 % IV SOLN
200.0000 mg | Freq: Once | INTRAVENOUS | Status: AC
  Administered 2022-04-29: 200 mg via INTRAVENOUS
  Filled 2022-04-29: qty 200

## 2022-04-29 MED ORDER — HEPARIN SOD (PORK) LOCK FLUSH 100 UNIT/ML IV SOLN
500.0000 [IU] | Freq: Once | INTRAVENOUS | Status: AC | PRN
  Administered 2022-04-29: 500 [IU]

## 2022-04-29 MED ORDER — SODIUM CHLORIDE 0.9 % IV SOLN: Freq: Once | INTRAVENOUS | Status: AC

## 2022-04-29 MED ORDER — SODIUM CHLORIDE 0.9% FLUSH
10.0000 mL | Freq: Once | INTRAVENOUS | Status: AC
  Administered 2022-04-29: 10 mL

## 2022-04-29 MED ORDER — SODIUM CHLORIDE 0.9% FLUSH
10.0000 mL | INTRAVENOUS | Status: DC | PRN
  Administered 2022-04-29: 10 mL

## 2022-04-29 MED ORDER — SODIUM CHLORIDE 0.9 % IV SOLN
167.2000 mg | Freq: Once | INTRAVENOUS | Status: AC
  Administered 2022-04-29: 170 mg via INTRAVENOUS
  Filled 2022-04-29: qty 17

## 2022-04-29 MED ORDER — SODIUM CHLORIDE 0.9 % IV SOLN
45.0000 mg/m2 | Freq: Once | INTRAVENOUS | Status: AC
  Administered 2022-04-29: 84 mg via INTRAVENOUS
  Filled 2022-04-29: qty 14

## 2022-04-29 MED ORDER — SODIUM CHLORIDE 0.9 % IV SOLN
10.0000 mg | Freq: Once | INTRAVENOUS | Status: AC
  Administered 2022-04-29: 10 mg via INTRAVENOUS
  Filled 2022-04-29: qty 10

## 2022-04-29 MED ORDER — DIPHENHYDRAMINE HCL 50 MG/ML IJ SOLN
50.0000 mg | Freq: Once | INTRAMUSCULAR | Status: AC
  Administered 2022-04-29: 50 mg via INTRAVENOUS
  Filled 2022-04-29: qty 1

## 2022-04-29 MED ORDER — FAMOTIDINE IN NACL 20-0.9 MG/50ML-% IV SOLN
20.0000 mg | Freq: Once | INTRAVENOUS | Status: AC
  Administered 2022-04-29: 20 mg via INTRAVENOUS
  Filled 2022-04-29: qty 50

## 2022-04-29 MED ORDER — PALONOSETRON HCL INJECTION 0.25 MG/5ML
0.2500 mg | Freq: Once | INTRAVENOUS | Status: AC
  Administered 2022-04-29: 0.25 mg via INTRAVENOUS
  Filled 2022-04-29: qty 5

## 2022-04-29 NOTE — Patient Instructions (Signed)
[facility]  Discharge Instructions: Thank you for choosing [facility] to provide your oncology and hematology care.   If you have a lab appointment with the Cancer Center, please go directly to the Cancer Center and check in at the registration area.   Wear comfortable clothing and clothing appropriate for easy access to any Portacath or PICC line.   We strive to give you quality time with your provider. You may need to reschedule your appointment if you arrive late (15 or more minutes).  Arriving late affects you and other patients whose appointments are after yours.  Also, if you miss three or more appointments without notifying the office, you may be dismissed from the clinic at the provider's discretion.      For prescription refill requests, have your pharmacy contact our office and allow 72 hours for refills to be completed.    Today you received the following chemotherapy and/or immunotherapy agents: pembrolizumab, paclitaxel, and carboplatin      To help prevent nausea and vomiting after your treatment, we encourage you to take your nausea medication as directed.  BELOW ARE SYMPTOMS THAT SHOULD BE REPORTED IMMEDIATELY: *FEVER GREATER THAN 100.4 F (38 C) OR HIGHER *CHILLS OR SWEATING *NAUSEA AND VOMITING THAT IS NOT CONTROLLED WITH YOUR NAUSEA MEDICATION *UNUSUAL SHORTNESS OF BREATH *UNUSUAL BRUISING OR BLEEDING *URINARY PROBLEMS (pain or burning when urinating, or frequent urination) *BOWEL PROBLEMS (unusual diarrhea, constipation, pain near the anus) TENDERNESS IN MOUTH AND THROAT WITH OR WITHOUT PRESENCE OF ULCERS (sore throat, sores in mouth, or a toothache) UNUSUAL RASH, SWELLING OR PAIN  UNUSUAL VAGINAL DISCHARGE OR ITCHING   Items with * indicate a potential emergency and should be followed up as soon as possible or go to the Emergency Department if any problems should occur.  Please show the CHEMOTHERAPY ALERT CARD or  IMMUNOTHERAPY ALERT CARD at check-in to the Emergency Department and triage nurse.  Should you have questions after your visit or need to cancel or reschedule your appointment, please contact [facility]  Dept: [phone]  and follow the prompts.  Office hours are 8:00 a.m. to 4:30 p.m. Monday - Friday. Please note that voicemails left after 4:00 p.m. may not be returned until the following business day.  We are closed weekends and major holidays. You have access to a nurse at all times for urgent questions. Please call the main number to the clinic Dept: [phone] and follow the prompts.   For any non-urgent questions, you may also contact your provider using MyChart. We now offer e-Visits for anyone 13 and older to request care online for non-urgent symptoms. For details visit [email or web address].   Also download the MyChart app! Go to the app store, search "MyChart", open the app, select [place], and log in with your MyChart username and password.

## 2022-05-02 ENCOUNTER — Ambulatory Visit
Admission: RE | Admit: 2022-05-02 | Discharge: 2022-05-02 | Disposition: A | Discharge status: Home / Self Care | Payer: 59 | Source: Ambulatory Visit | Attending: Hematology and Oncology | Admitting: Hematology and Oncology

## 2022-05-02 ENCOUNTER — Other Ambulatory Visit: Payer: 59

## 2022-05-02 DIAGNOSIS — R5381 Other malaise: Secondary | ICD-10-CM | POA: Diagnosis not present

## 2022-05-02 DIAGNOSIS — R918 Other nonspecific abnormal finding of lung field: Secondary | ICD-10-CM | POA: Diagnosis not present

## 2022-05-02 DIAGNOSIS — C3490 Malignant neoplasm of unspecified part of unspecified bronchus or lung: Secondary | ICD-10-CM

## 2022-05-02 DIAGNOSIS — G9389 Other specified disorders of brain: Secondary | ICD-10-CM | POA: Diagnosis not present

## 2022-05-02 DIAGNOSIS — R928 Other abnormal and inconclusive findings on diagnostic imaging of breast: Secondary | ICD-10-CM | POA: Diagnosis not present

## 2022-05-02 DIAGNOSIS — C50919 Malignant neoplasm of unspecified site of unspecified female breast: Secondary | ICD-10-CM | POA: Diagnosis not present

## 2022-05-02 DIAGNOSIS — N6315 Unspecified lump in the right breast, overlapping quadrants: Secondary | ICD-10-CM | POA: Diagnosis not present

## 2022-05-05 ENCOUNTER — Other Ambulatory Visit (HOSPITAL_COMMUNITY): Payer: 59

## 2022-05-05 ENCOUNTER — Ambulatory Visit (HOSPITAL_COMMUNITY)
Admission: RE | Admit: 2022-05-05 | Discharge: 2022-05-05 | Disposition: A | Discharge status: Home / Self Care | Payer: 59 | Source: Ambulatory Visit | Attending: Hematology and Oncology | Admitting: Hematology and Oncology

## 2022-05-05 ENCOUNTER — Other Ambulatory Visit: Payer: Self-pay | Admitting: Hematology and Oncology

## 2022-05-05 DIAGNOSIS — C50911 Malignant neoplasm of unspecified site of right female breast: Secondary | ICD-10-CM | POA: Diagnosis not present

## 2022-05-05 DIAGNOSIS — C3490 Malignant neoplasm of unspecified part of unspecified bronchus or lung: Secondary | ICD-10-CM

## 2022-05-05 LAB — GLUCOSE, CAPILLARY: Glucose-Capillary: 57 mg/dL — ABNORMAL LOW (ref 70–99)

## 2022-05-05 MED ORDER — HEPARIN SOD (PORK) LOCK FLUSH 100 UNIT/ML IV SOLN
500.0000 [IU] | Freq: Once | INTRAVENOUS | Status: AC
  Administered 2022-05-05: 500 [IU] via INTRAVENOUS
  Filled 2022-05-05: qty 5

## 2022-05-05 MED ORDER — FLUDEOXYGLUCOSE F - 18 (FDG) INJECTION
7.3000 | Freq: Once | INTRAVENOUS | Status: AC
  Administered 2022-05-05: 8.54 via INTRAVENOUS

## 2022-05-05 MED FILL — Dexamethasone Sodium Phosphate Inj 100 MG/10ML: INTRAMUSCULAR | Qty: 1 | Status: AC

## 2022-05-06 ENCOUNTER — Inpatient Hospital Stay: Payer: 59

## 2022-05-06 ENCOUNTER — Other Ambulatory Visit: Payer: Self-pay

## 2022-05-06 ENCOUNTER — Inpatient Hospital Stay (HOSPITAL_BASED_OUTPATIENT_CLINIC_OR_DEPARTMENT_OTHER): Payer: 59 | Admitting: Hematology and Oncology

## 2022-05-06 VITALS — BP 115/64 | HR 86 | Resp 17

## 2022-05-06 DIAGNOSIS — Z803 Family history of malignant neoplasm of breast: Secondary | ICD-10-CM | POA: Diagnosis not present

## 2022-05-06 DIAGNOSIS — C3490 Malignant neoplasm of unspecified part of unspecified bronchus or lung: Secondary | ICD-10-CM | POA: Diagnosis not present

## 2022-05-06 DIAGNOSIS — Z95828 Presence of other vascular implants and grafts: Secondary | ICD-10-CM

## 2022-05-06 DIAGNOSIS — Z171 Estrogen receptor negative status [ER-]: Secondary | ICD-10-CM | POA: Diagnosis not present

## 2022-05-06 DIAGNOSIS — Z853 Personal history of malignant neoplasm of breast: Secondary | ICD-10-CM | POA: Diagnosis not present

## 2022-05-06 DIAGNOSIS — R739 Hyperglycemia, unspecified: Secondary | ICD-10-CM

## 2022-05-06 DIAGNOSIS — C7931 Secondary malignant neoplasm of brain: Secondary | ICD-10-CM | POA: Diagnosis not present

## 2022-05-06 DIAGNOSIS — C3412 Malignant neoplasm of upper lobe, left bronchus or lung: Secondary | ICD-10-CM | POA: Diagnosis not present

## 2022-05-06 DIAGNOSIS — Z923 Personal history of irradiation: Secondary | ICD-10-CM | POA: Diagnosis not present

## 2022-05-06 DIAGNOSIS — Z5111 Encounter for antineoplastic chemotherapy: Secondary | ICD-10-CM | POA: Diagnosis not present

## 2022-05-06 LAB — CMP (CANCER CENTER ONLY)
ALT: 19 U/L (ref 0–44)
AST: 14 U/L — ABNORMAL LOW (ref 15–41)
Albumin: 3.9 g/dL (ref 3.5–5.0)
Alkaline Phosphatase: 86 U/L (ref 38–126)
Anion gap: 5 (ref 5–15)
BUN: 14 mg/dL (ref 8–23)
CO2: 25 mmol/L (ref 22–32)
Calcium: 9.6 mg/dL (ref 8.9–10.3)
Chloride: 107 mmol/L (ref 98–111)
Creatinine: 0.56 mg/dL (ref 0.44–1.00)
GFR, Estimated: >60 mL/min (ref >60)
Glucose, Bld: 122 mg/dL — ABNORMAL HIGH (ref 70–99)
Potassium: 4 mmol/L (ref 3.5–5.1)
Sodium: 137 mmol/L (ref 135–145)
Total Bilirubin: 0.4 mg/dL (ref 0.3–1.2)
Total Protein: 6.6 g/dL (ref 6.5–8.1)

## 2022-05-06 LAB — CBC WITH DIFFERENTIAL (CANCER CENTER ONLY)
Abs Immature Granulocytes: 0.02 10*3/uL (ref 0.00–0.07)
Basophils Absolute: 0 10*3/uL (ref 0.0–0.1)
Basophils Relative: 1 %
Eosinophils Absolute: 0.1 10*3/uL (ref 0.0–0.5)
Eosinophils Relative: 2 %
HCT: 38.9 % (ref 36.0–46.0)
Hemoglobin: 12.9 g/dL (ref 12.0–15.0)
Immature Granulocytes: 1 %
Lymphocytes Relative: 15 %
Lymphs Abs: 0.7 10*3/uL (ref 0.7–4.0)
MCH: 30.9 pg (ref 26.0–34.0)
MCHC: 33.2 g/dL (ref 30.0–36.0)
MCV: 93.1 fL (ref 80.0–100.0)
Monocytes Absolute: 0.6 10*3/uL (ref 0.1–1.0)
Monocytes Relative: 13 %
Neutro Abs: 3 10*3/uL (ref 1.7–7.7)
Neutrophils Relative %: 68 %
Platelet Count: 319 10*3/uL (ref 150–400)
RBC: 4.18 MIL/uL (ref 3.87–5.11)
RDW: 16.8 % — ABNORMAL HIGH (ref 11.5–15.5)
WBC Count: 4.4 10*3/uL (ref 4.0–10.5)
nRBC: 0 % (ref 0.0–0.2)

## 2022-05-06 MED ORDER — SODIUM CHLORIDE 0.9 % IV SOLN
45.0000 mg/m2 | Freq: Once | INTRAVENOUS | Status: AC
  Administered 2022-05-06: 84 mg via INTRAVENOUS
  Filled 2022-05-06: qty 14

## 2022-05-06 MED ORDER — SODIUM CHLORIDE 0.9% FLUSH
10.0000 mL | INTRAVENOUS | Status: DC | PRN
  Administered 2022-05-06: 10 mL

## 2022-05-06 MED ORDER — SODIUM CHLORIDE 0.9% FLUSH
10.0000 mL | Freq: Once | INTRAVENOUS | Status: AC
  Administered 2022-05-06: 10 mL

## 2022-05-06 MED ORDER — SODIUM CHLORIDE 0.9 % IV SOLN
10.0000 mg | Freq: Once | INTRAVENOUS | Status: AC
  Administered 2022-05-06: 10 mg via INTRAVENOUS
  Filled 2022-05-06: qty 10

## 2022-05-06 MED ORDER — SODIUM CHLORIDE 0.9 % IV SOLN: Freq: Once | INTRAVENOUS | Status: AC

## 2022-05-06 MED ORDER — PALONOSETRON HCL INJECTION 0.25 MG/5ML
0.2500 mg | Freq: Once | INTRAVENOUS | Status: AC
  Administered 2022-05-06: 0.25 mg via INTRAVENOUS
  Filled 2022-05-06: qty 5

## 2022-05-06 MED ORDER — HEPARIN SOD (PORK) LOCK FLUSH 100 UNIT/ML IV SOLN
500.0000 [IU] | Freq: Once | INTRAVENOUS | Status: AC | PRN
  Administered 2022-05-06: 500 [IU]

## 2022-05-06 MED ORDER — FAMOTIDINE IN NACL 20-0.9 MG/50ML-% IV SOLN
20.0000 mg | Freq: Once | INTRAVENOUS | Status: AC
  Administered 2022-05-06: 20 mg via INTRAVENOUS
  Filled 2022-05-06: qty 50

## 2022-05-06 MED ORDER — DIPHENHYDRAMINE HCL 50 MG/ML IJ SOLN
50.0000 mg | Freq: Once | INTRAMUSCULAR | Status: AC
  Administered 2022-05-06: 50 mg via INTRAVENOUS
  Filled 2022-05-06: qty 1

## 2022-05-06 MED ORDER — SODIUM CHLORIDE 0.9 % IV SOLN
167.2000 mg | Freq: Once | INTRAVENOUS | Status: AC
  Administered 2022-05-06: 170 mg via INTRAVENOUS
  Filled 2022-05-06: qty 17

## 2022-05-06 NOTE — Progress Notes (Signed)
[facility] Cancer Follow up:    Jennifer Earing, FNP [address] 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  The report date is February 27, 2022.  The CancerNext-Expanded gene panel offered by [facility] and includes sequencing and rearrangement analysis for the following 77 genes: AIP, ALK, APC*, ATM*, AXIN2, BAP1, BARD1, BLM, BMPR1A, BRCA1*, BRCA2*, BRIP1*, CDC73, CDH1*, CDK4, CDKN1B, CDKN2A, CHEK2*, CTNNA1, DICER1, FANCC, FH, FLCN, GALNT12, KIF1B, LZTR1, MAX, MEN1, MET, MLH1*, MSH2*, MSH3, MSH6*, MUTYH*, NBN, NF1*, NF2, NTHL1, PALB2*, PHOX2B, PMS2*, POT1, PRKAR1A, PTCH1, PTEN*, RAD51C*, RAD51D*, RB1, RECQL, RET, SDHA, SDHAF2, SDHB, SDHC, SDHD, SMAD4, SMARCA4, SMARCB1, SMARCE1, STK11, SUFU, TMEM127, TP53*, TSC1, TSC2, VHL and XRCC2 (sequencing and deletion/duplication); EGFR, EGLN1, HOXB13, KIT, MITF, PDGFRA, POLD1, and POLE (sequencing only); EPCAM and GREM1 (deletion/duplication only). DNA and RNA analyses performed for * genes.    Malignant neoplasm of unspecified part of unspecified bronchus or lung  08/13/2021 Initial Diagnosis   Malignant neoplasm of unspecified part of unspecified bronchus or lung (HCC)   08/30/2021 - 09/13/2021 Chemotherapy   Patient is on Treatment Plan : LUNG Carboplatin / Paclitaxel + XRT q7d     08/30/2021 - 10/11/2021 Chemotherapy   Patient is on Treatment Plan : LUNG Carboplatin + Paclitaxel + XRT q7d     11/16/2021 - 02/17/2022 Chemotherapy   Patient is on Treatment Plan : LUNG Pembrolizumab Q 21D     03/16/2022 - 03/16/2022 Chemotherapy   Patient is on Treatment Plan : BREAST Pembrolizumab (400) D1 + Carboplatin (1.5) weekly + Paclitaxel (80) weekly q42d X 2 cycles /  Pembrolizumab (400) D1 + AC D1,22 q42d x 2 cycles     03/18/2022 -  Chemotherapy   Patient is on Treatment Plan : BREAST Pembrolizumab (200) D1 + Carboplatin (2) D1,8,15 + Paclitaxel (45) D1,8,15 q21d X 2 cycles       CURRENT THERAPY: Pembrolizumab  INTERVAL HISTORY:  Jennifer Cowan 77 y.o. female returns for follow-up  Interval History  Since her last visit here, she is now back on weekly CarboTaxol with Keytruda given the increase in the bulk of the breast mass while she is off of chemotherapy. She denies any adverse effects with chemotherapy.  She says she is not a morning person, but she says besides feeling tired, she denies any complaints at all. She is happy that the breast mass is shrinking. She denies any nausea, vomiting or diarrhea. No neuropathy. 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  MEDICAL HISTORY: Past Medical History:  Diagnosis Date   Anemia    Arthritis    Breast cancer    Cancer 1999   Breast Cancer    Family history of breast cancer    Hyperlipidemia    Hypertension    Osteopenia 01/12/2021   Personal history of malignant neoplasm of breast     SURGICAL HISTORY: Past Surgical History:  Procedure Laterality Date   BREAST SURGERY  1999   Masectomy- left   BRONCHIAL BIOPSY  07/30/2021   Procedure: BRONCHIAL BIOPSIES;  Surgeon: Leslye Peer, MD;  Location: [facility] ENDOSCOPY;  Service: Pulmonary;;   BRONCHIAL BRUSHINGS  07/30/2021   Procedure: BRONCHIAL BRUSHINGS;  Surgeon: Leslye Peer, MD;  Location: [facility] ENDOSCOPY;  Service: Pulmonary;;   BRONCHIAL NEEDLE ASPIRATION BIOPSY  07/30/2021   Procedure: BRONCHIAL NEEDLE ASPIRATION BIOPSIES;  Surgeon: Leslye Peer, MD;  Location: [facility] ENDOSCOPY;  Service: Pulmonary;;   CYSTECTOMY     cyst removed off top of head   IR IMAGING GUIDED PORT INSERTION  08/20/2021   MASTECTOMY     VIDEO BRONCHOSCOPY WITH RADIAL ENDOBRONCHIAL ULTRASOUND  07/30/2021   Procedure: VIDEO BRONCHOSCOPY WITH RADIAL ENDOBRONCHIAL ULTRASOUND;  Surgeon: Leslye Peer, MD;  Location: [facility] ENDOSCOPY;  Service: Pulmonary;;    SOCIAL HISTORY: Social History   Socioeconomic History   Marital status: Single    Spouse name: Not on file   Number of children: 0   Years of education: 12   Highest education level: High school graduate  Occupational History   Occupation: retired  Tobacco Use   Smoking status: Never   Smokeless tobacco: Never  Vaping Use   Vaping Use: Never used  Substance and Sexual Activity   Alcohol use: No   Drug use: Never   Sexual activity: Yes    Birth control/protection: Post-menopausal  Other Topics Concern   Not on file  Social History Narrative   Not on file   Social Determinants of Health   Financial Resource Strain: Not on file  Food Insecurity: Not on file  Transportation Needs: Not on file  Physical Activity: Not on file  Stress: Not on file  Social Connections: Not on file  Intimate Partner Violence: Not on file    FAMILY  HISTORY: Family History  Problem Relation Age of Onset   Arthritis Mother    Breast cancer Sister 15   Diabetes Sister    Hyperlipidemia Sister    Hypertension Sister    Stroke Sister    Dementia Sister    Diabetes Brother    Hypertension Brother    Breast cancer Niece 67    Review of Systems  Constitutional:  Positive for fatigue. Negative for appetite change, chills, fever and unexpected weight change.  HENT:   Negative for hearing loss, lump/mass and trouble swallowing.   Eyes:  Negative for eye problems and icterus.  Respiratory:  Negative for chest tightness, cough and shortness of breath.   Cardiovascular:  Negative for chest pain, leg swelling and palpitations.  Gastrointestinal:  Negative for abdominal distention, abdominal pain, constipation, diarrhea, nausea and vomiting.  Endocrine: Negative for hot flashes.  Genitourinary:  Negative for difficulty urinating.   Musculoskeletal:  Positive for back pain. Negative for arthralgias.  Skin:  Negative for itching and rash.  Neurological:  Negative for dizziness, extremity  weakness, headaches and numbness.  Hematological:  Negative for adenopathy. Does not bruise/bleed easily.  Psychiatric/Behavioral:  Negative for depression. The patient is not nervous/anxious.       PHYSICAL EXAMINATION  ECOG PERFORMANCE STATUS: 1 - Symptomatic but completely ambulatory  Vitals:   05/06/22 0908  BP: 126/61  Pulse: 100  Resp: 16  Temp: 98.2 F (36.8 C)  SpO2: 100%      Physical Exam Constitutional:      General: She is not in acute distress.    Appearance: Normal appearance. She is not toxic-appearing.  HENT:     Head: Normocephalic and atraumatic.  Eyes:     General: No scleral icterus. Cardiovascular:     Rate and Rhythm: Normal rate and regular rhythm.     Pulses: Normal pulses.     Heart sounds: Normal heart sounds.     Comments: Heart sounds clear and loud, no suggestion of large pericardial effusion. Pulmonary:      Effort: Pulmonary effort is normal.     Breath sounds: Normal breath sounds.  Chest:     Comments: Right breast mass appears smaller. No palpable right axillary adenopathy Abdominal:     General: Abdomen is flat. Bowel sounds are normal. There is no distension.     Palpations: Abdomen is soft.     Tenderness: There is no abdominal tenderness.  Musculoskeletal:        General: No swelling.     Cervical back: Neck supple.  Lymphadenopathy:     Cervical: No cervical adenopathy.  Skin:    General: Skin is warm and dry.     Findings: No rash.  Neurological:     General: No focal deficit present.     Mental Status: She is alert.  Psychiatric:        Mood and Affect: Mood normal.        Behavior: Behavior normal.     LABORATORY DATA:  CBC    Component Value Date/Time   WBC 4.4 05/06/2022 0845   WBC 6.7 07/25/2021 0111   RBC 4.18 05/06/2022 0845   HGB 12.9 05/06/2022 0845   HGB 15.7 07/07/2021 1538   HCT 38.9 05/06/2022 0845   HCT 46.6 07/07/2021 1538   PLT 319 05/06/2022 0845   PLT 450 07/07/2021 1538   MCV 93.1 05/06/2022 0845   MCV 88 07/07/2021 1538   MCH 30.9 05/06/2022 0845   MCHC 33.2 05/06/2022 0845   RDW 16.8 (H) 05/06/2022 0845   RDW 12.9 07/07/2021 1538   LYMPHSABS 0.7 05/06/2022 0845   LYMPHSABS 1.6 07/07/2021 1538   MONOABS 0.6 05/06/2022 0845   EOSABS 0.1 05/06/2022 0845   EOSABS 0.1 07/07/2021 1538   BASOSABS 0.0 05/06/2022 0845   BASOSABS 0.0 07/07/2021 1538    CMP     Component Value Date/Time   NA 137 05/06/2022 0845   NA 141 07/07/2021 1538   K 4.0 05/06/2022 0845   CL 107 05/06/2022 0845   CO2 25 05/06/2022 0845   GLUCOSE 122 (H) 05/06/2022 0845   BUN 14 05/06/2022 0845   BUN 9 07/07/2021 1538   CREATININE 0.56 05/06/2022 0845   CALCIUM 9.6 05/06/2022 0845   PROT 6.6 05/06/2022 0845   PROT 7.9 07/07/2021 1538   ALBUMIN 3.9 05/06/2022 0845   ALBUMIN 4.6 07/07/2021 1538   AST 14 (L) 05/06/2022 0845   ALT 19 05/06/2022 0845    ALKPHOS 86 05/06/2022 0845   BILITOT 0.4 05/06/2022 0845   GFRNONAA >60 05/06/2022  0845   GFRAA 103 03/05/2020 1119    ASSESSMENT and THERAPY PLAN:   Malignant neoplasm of unspecified part of unspecified bronchus or lung (HCC) This is a 77 year old patient with 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  She also appears to have had some regional adenopathy including prevascular, AP window and left hilar adenopathy.  She has multiple intracranial metastatic lesions which are likely secondary to metastatic lung cancer given presence of regional adenopathy.   She is now status post SRS to the brain lesions. She started concurrent chemoradiation with CarboTaxol on August 30, 2021.  She completed chemoradiation on October 15, 2021.  End of treatment PET/CT reviewed consistent with response in the lung, likely postradiation changes noted. PET also commented on decrease in size of the breast mass.  We started noticing increasing size of the right breast mass while she was on immunotherapy alone.  We have restarted her on weekly CarboTaxol with immunotherapy.   Her right breast mass appears smaller on exam today. Most recent breast imaging also confirms response. PET per my reading with no evidence of  lung ca recurrence. I spoke with Dr. Donell Beers from breast surgery and she recommended a referral to breast surgery.  Since we cannot go indefinitely on chemotherapy and the breast cancer has not responded to immunotherapy and also since her other cancers are inactive at this time I think she will be best served by surgery followed by immunotherapy maintenance and surveillance.  She is agreeable to this.  Will wait for recommendations from Dr. Donell Beers.  Okay to proceed with treatment today as planned.  Rachel Moulds MD

## 2022-05-06 NOTE — Assessment & Plan Note (Addendum)
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  She also appears to have had some regional adenopathy including prevascular, AP window and left hilar adenopathy.  She has multiple intracranial metastatic lesions which are likely secondary to metastatic lung cancer given presence of regional adenopathy.   She is now status post SRS to the brain lesions. She started concurrent chemoradiation with CarboTaxol on August 30, 2021.  She completed chemoradiation on October 15, 2021.  End of treatment PET/CT reviewed consistent with response in the lung, likely postradiation changes noted. PET also commented on decrease in size of the breast mass.  We started noticing increasing size of the right breast mass while she was on immunotherapy alone.  We have restarted her on weekly CarboTaxol with immunotherapy.   Her right breast mass appears smaller on exam today. Most recent breast imaging also confirms response. PET per my reading with no evidence of  lung ca recurrence. I spoke with Dr. Donell Beers from breast surgery and she recommended a referral to breast surgery.  Since we cannot go indefinitely on chemotherapy and the breast cancer has not responded to immunotherapy and also since her other cancers are inactive at this time I think she will be best served by surgery followed by immunotherapy maintenance and surveillance.  She is agreeable to this.  Will wait for recommendations from Dr. Donell Beers.  Okay to proceed with treatment today as planned.  Rachel Moulds MD

## 2022-05-11 ENCOUNTER — Other Ambulatory Visit: Payer: Self-pay | Admitting: Family Medicine

## 2022-05-11 DIAGNOSIS — E782 Mixed hyperlipidemia: Secondary | ICD-10-CM

## 2022-05-11 DIAGNOSIS — M159 Polyosteoarthritis, unspecified: Secondary | ICD-10-CM

## 2022-05-12 ENCOUNTER — Other Ambulatory Visit: Payer: Self-pay | Admitting: Family Medicine

## 2022-05-12 ENCOUNTER — Ambulatory Visit
Admission: RE | Admit: 2022-05-12 | Discharge: 2022-05-12 | Disposition: A | Discharge status: Home / Self Care | Payer: 59 | Source: Ambulatory Visit | Attending: Internal Medicine | Admitting: Internal Medicine

## 2022-05-12 ENCOUNTER — Other Ambulatory Visit: Payer: Self-pay | Admitting: *Deleted

## 2022-05-12 DIAGNOSIS — C729 Malignant neoplasm of central nervous system, unspecified: Secondary | ICD-10-CM | POA: Diagnosis not present

## 2022-05-12 DIAGNOSIS — C3490 Malignant neoplasm of unspecified part of unspecified bronchus or lung: Secondary | ICD-10-CM

## 2022-05-12 DIAGNOSIS — I10 Essential (primary) hypertension: Secondary | ICD-10-CM

## 2022-05-12 DIAGNOSIS — C7931 Secondary malignant neoplasm of brain: Secondary | ICD-10-CM

## 2022-05-12 DIAGNOSIS — R918 Other nonspecific abnormal finding of lung field: Secondary | ICD-10-CM

## 2022-05-12 MED ORDER — GADOPICLENOL 0.5 MMOL/ML IV SOLN
8.0000 mL | Freq: Once | INTRAVENOUS | Status: AC | PRN
  Administered 2022-05-12: 8 mL via INTRAVENOUS

## 2022-05-12 MED ORDER — HEPARIN SOD (PORK) LOCK FLUSH 100 UNIT/ML IV SOLN
500.0000 [IU] | Freq: Once | INTRAVENOUS | Status: AC
  Administered 2022-05-12: 500 [IU] via INTRAVENOUS

## 2022-05-12 MED ORDER — SODIUM CHLORIDE 0.9% FLUSH
10.0000 mL | INTRAVENOUS | Status: DC | PRN
  Administered 2022-05-12: 10 mL via INTRAVENOUS

## 2022-05-13 ENCOUNTER — Encounter: Payer: Self-pay | Admitting: Hematology and Oncology

## 2022-05-13 ENCOUNTER — Inpatient Hospital Stay (HOSPITAL_BASED_OUTPATIENT_CLINIC_OR_DEPARTMENT_OTHER): Payer: 59 | Admitting: Hematology and Oncology

## 2022-05-13 ENCOUNTER — Inpatient Hospital Stay: Payer: 59

## 2022-05-13 ENCOUNTER — Other Ambulatory Visit: Payer: Self-pay | Admitting: General Surgery

## 2022-05-13 ENCOUNTER — Other Ambulatory Visit: Payer: Self-pay

## 2022-05-13 VITALS — BP 121/53 | HR 96 | Temp 97.8°F | Resp 16 | Ht 68.0 in | Wt 172.7 lb

## 2022-05-13 DIAGNOSIS — C3492 Malignant neoplasm of unspecified part of left bronchus or lung: Secondary | ICD-10-CM | POA: Diagnosis not present

## 2022-05-13 DIAGNOSIS — Z803 Family history of malignant neoplasm of breast: Secondary | ICD-10-CM | POA: Diagnosis not present

## 2022-05-13 DIAGNOSIS — C3490 Malignant neoplasm of unspecified part of unspecified bronchus or lung: Secondary | ICD-10-CM

## 2022-05-13 DIAGNOSIS — Z853 Personal history of malignant neoplasm of breast: Secondary | ICD-10-CM | POA: Diagnosis not present

## 2022-05-13 DIAGNOSIS — Z923 Personal history of irradiation: Secondary | ICD-10-CM | POA: Diagnosis not present

## 2022-05-13 DIAGNOSIS — C50211 Malignant neoplasm of upper-inner quadrant of right female breast: Secondary | ICD-10-CM | POA: Diagnosis not present

## 2022-05-13 DIAGNOSIS — Z5111 Encounter for antineoplastic chemotherapy: Secondary | ICD-10-CM | POA: Diagnosis not present

## 2022-05-13 DIAGNOSIS — Z9012 Acquired absence of left breast and nipple: Secondary | ICD-10-CM | POA: Diagnosis not present

## 2022-05-13 DIAGNOSIS — C7931 Secondary malignant neoplasm of brain: Secondary | ICD-10-CM | POA: Diagnosis not present

## 2022-05-13 DIAGNOSIS — Z171 Estrogen receptor negative status [ER-]: Secondary | ICD-10-CM | POA: Diagnosis not present

## 2022-05-13 DIAGNOSIS — C3412 Malignant neoplasm of upper lobe, left bronchus or lung: Secondary | ICD-10-CM | POA: Diagnosis not present

## 2022-05-13 NOTE — Assessment & Plan Note (Signed)
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  She also appears to have had some regional adenopathy including prevascular, AP window and left hilar adenopathy.  She has multiple intracranial metastatic lesions which are likely secondary to metastatic lung cancer given presence of regional adenopathy.   She is now status post SRS to the brain lesions. She started concurrent chemoradiation with CarboTaxol on August 30, 2021.  She completed chemoradiation on October 15, 2021.  End of treatment PET/CT reviewed consistent with response in the lung, likely postradiation changes noted. PET also commented on decrease in size of the breast mass.  We started noticing increasing size of the right breast mass while she was on immunotherapy alone.  We have restarted her on weekly CarboTaxol with immunotherapy.   Her right breast mass appears smaller on exam today. Most recent breast imaging also confirms response. PET per my reading with no evidence of  lung ca recurrence. 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 We are awaiting Dr Joellen Jersey recommendations and Dr Liana Gerold recommendations. We will hold chemo for the time being to allow healing and in prep for surgery I will see her back in 4 weeks.  Rachel Moulds MD

## 2022-05-13 NOTE — Progress Notes (Signed)
[facility] Cancer Follow up:    [name], FNP [address] [place] [address]   DIAGNOSIS: lung cancer and breast cancer   SUMMARY OF ONCOLOGIC HISTORY: Oncology History  Infiltrating duct carcinoma (HCC)  07/25/2021 Initial Diagnosis   Infiltrating duct carcinoma (HCC)   02/27/2022 Genetic Testing   Negative genetic testing on the CancerNext-Expanded+RNAinsight panel.  The report date is February 27, 2022.  The CancerNext-Expanded gene panel offered by [facility] and includes sequencing and rearrangement analysis for the following 77 genes: AIP, ALK, APC*, ATM*, AXIN2, BAP1, BARD1, BLM, BMPR1A, BRCA1*, BRCA2*, BRIP1*, CDC73, CDH1*, CDK4, CDKN1B, CDKN2A, CHEK2*, CTNNA1, DICER1, FANCC, FH, FLCN, GALNT12, KIF1B, LZTR1, MAX, MEN1, MET, MLH1*, MSH2*, MSH3, MSH6*, MUTYH*, NBN, NF1*, NF2, NTHL1, PALB2*, PHOX2B, PMS2*, POT1, PRKAR1A, PTCH1, PTEN*, RAD51C*, RAD51D*, RB1, RECQL, RET, SDHA, SDHAF2, SDHB, SDHC, SDHD, SMAD4, SMARCA4, SMARCB1, SMARCE1, STK11, SUFU, TMEM127, TP53*, TSC1, TSC2, VHL and XRCC2 (sequencing and deletion/duplication); EGFR, EGLN1, HOXB13, KIT, MITF, PDGFRA, POLD1, and POLE (sequencing only); EPCAM and GREM1 (deletion/duplication only). DNA and RNA analyses performed for * genes.    Malignant neoplasm of unspecified part of unspecified bronchus or lung (HCC)  08/13/2021 Initial Diagnosis   Malignant neoplasm of unspecified part of unspecified bronchus or lung (HCC)   08/30/2021 - 09/13/2021 Chemotherapy   Patient is on Treatment Plan : LUNG Carboplatin / Paclitaxel + XRT q7d     08/30/2021 - 10/11/2021 Chemotherapy   Patient is on Treatment Plan : LUNG Carboplatin + Paclitaxel + XRT q7d     11/16/2021 - 02/17/2022 Chemotherapy   Patient is on Treatment Plan : LUNG Pembrolizumab Q 21D     03/16/2022 - 03/16/2022 Chemotherapy   Patient is on Treatment Plan : BREAST Pembrolizumab (400) D1 + Carboplatin (1.5) weekly + Paclitaxel (80) weekly q42d X 2  cycles / Pembrolizumab (400) D1 + AC D1,22 q42d x 2 cycles     03/18/2022 -  Chemotherapy   Patient is on Treatment Plan : BREAST Pembrolizumab (200) D1 + Carboplatin (2) D1,8,15 + Paclitaxel (45) D1,8,15 q21d X 2 cycles       CURRENT THERAPY: Pembrolizumab  INTERVAL HISTORY:  Jennifer Cowan 77 y.o. female returns for follow-up.  Interval History  Since her last visit here, she is now back on weekly CarboTaxol with Keytruda given the increase in the bulk of the breast mass while she is off of chemotherapy. She is responding really well with chemotherapy, breast mass is much smaller. She saw Dr Donell Beers this morning and Dr Donell Beers discussed a few surgical options. She denies any other complaints today. Gabapentin is helping shingles pain. She denies any nausea, vomiting or diarrhea. No neuropathy. Rest of the pertinent 10 point ROS reviewed and neg  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   Type 2 diabetes mellitus with hyperglycemia, without long-term current use of insulin (HCC) 10/06/2021   Port-A-Cath in place 08/30/2021   Hyperglycemia 08/30/2021   Non-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) 08/13/2021   Malignant neoplasm of unspecified part of unspecified bronchus or lung (HCC) 08/13/2021   Physical deconditioning 07/27/2021   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   Mass of upper lobe of left lung 07/25/2021   Metastasis to brain [facility]) 07/24/2021   Overweight 07/07/2021   Osteopenia 01/12/2021   History of breast cancer 03/05/2020   Hypertension associated with  type 2 diabetes mellitus (HCC) 06/26/2018   Iron deficiency anemia 06/26/2018   Hyperlipidemia associated with type 2 diabetes mellitus (HCC) 06/26/2018   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  MEDICAL HISTORY: Past Medical History:   Diagnosis Date   Anemia    Arthritis    Breast cancer (HCC)    Cancer (HCC) 1999   Breast Cancer   Family history of breast cancer    Hyperlipidemia    Hypertension    Osteopenia 01/12/2021   Personal history of malignant neoplasm of breast     SURGICAL HISTORY: Past Surgical History:  Procedure Laterality Date   BREAST SURGERY  1999   Masectomy- left   BRONCHIAL BIOPSY  07/30/2021   Procedure: BRONCHIAL BIOPSIES;  Surgeon: Leslye Peer, MD;  Location: [facility] ENDOSCOPY;  Service: Pulmonary;;   BRONCHIAL BRUSHINGS  07/30/2021   Procedure: BRONCHIAL BRUSHINGS;  Surgeon: Leslye Peer, MD;  Location: [facility] ENDOSCOPY;  Service: Pulmonary;;   BRONCHIAL NEEDLE ASPIRATION BIOPSY  07/30/2021   Procedure: BRONCHIAL NEEDLE ASPIRATION BIOPSIES;  Surgeon: Leslye Peer, MD;  Location: [facility] ENDOSCOPY;  Service: Pulmonary;;   CYSTECTOMY     cyst removed off top of head   IR IMAGING GUIDED PORT INSERTION  08/20/2021   MASTECTOMY     VIDEO BRONCHOSCOPY WITH RADIAL ENDOBRONCHIAL ULTRASOUND  07/30/2021   Procedure: VIDEO BRONCHOSCOPY WITH RADIAL ENDOBRONCHIAL ULTRASOUND;  Surgeon: Leslye Peer, MD;  Location: [facility] ENDOSCOPY;  Service: Pulmonary;;    SOCIAL HISTORY: Social History   Socioeconomic History   Marital status: Single    Spouse name: Not on file   Number of children: 0   Years of education: 12   Highest education level: High school graduate  Occupational History   Occupation: retired  Tobacco Use   Smoking status: Never   Smokeless tobacco: Never  Vaping Use   Vaping Use: Never used  Substance and Sexual Activity   Alcohol use: No   Drug use: Never   Sexual activity: Yes    Birth control/protection: Post-menopausal  Other Topics Concern   Not on file  Social History Narrative   Not on file   Social Determinants of Health   Financial Resource Strain: Not on file  Food Insecurity: Not on file  Transportation Needs: Not on file  Physical Activity: Not on file  Stress:  Not on file  Social Connections: Not on file  Intimate Partner Violence: Not on file    FAMILY HISTORY: Family History  Problem Relation Age of Onset   Arthritis Mother    Breast cancer Sister 33   Diabetes Sister    Hyperlipidemia Sister    Hypertension Sister    Stroke Sister    Dementia Sister    Diabetes Brother    Hypertension Brother    Breast cancer Niece 66    Review of Systems  Constitutional:  Positive for fatigue. Negative for appetite change, chills, fever and unexpected weight change.  HENT:   Negative for hearing loss, lump/mass and trouble swallowing.   Eyes:  Negative for eye problems and icterus.  Respiratory:  Negative for chest tightness, cough and shortness of breath.   Cardiovascular:  Negative for chest pain, leg swelling and palpitations.  Gastrointestinal:  Negative for abdominal distention, abdominal pain, constipation, diarrhea, nausea and vomiting.  Endocrine: Negative for hot flashes.  Genitourinary:  Negative for difficulty urinating.   Musculoskeletal:  Positive for back pain. Negative for arthralgias.  Skin:  Negative for itching and rash.  Neurological:  Negative for dizziness, extremity weakness, headaches and numbness.  Hematological:  Negative for adenopathy. Does not bruise/bleed easily.  Psychiatric/Behavioral:  Negative for depression. The patient is not nervous/anxious.       PHYSICAL EXAMINATION  ECOG PERFORMANCE STATUS: 1 - Symptomatic but completely ambulatory  Vitals:   05/13/22 1158  BP: (!) 121/53  Pulse: 96  Resp: 16  Temp: 97.8 F (36.6 C)  SpO2: 100%      Physical Exam Constitutional:      General: She is not in acute distress.    Appearance: Normal appearance. She is not toxic-appearing.  HENT:     Head: Normocephalic and atraumatic.  Eyes:     General: No scleral icterus. Cardiovascular:     Rate and Rhythm: Normal rate and regular rhythm.     Pulses: Normal pulses.     Heart sounds: Normal heart sounds.   Pulmonary:     Effort: Pulmonary effort is normal.     Breath sounds: Normal breath sounds.  Chest:     Comments: Right breast mass appears smaller. No palpable right axillary adenopathy Abdominal:     General: Abdomen is flat. Bowel sounds are normal. There is no distension.     Palpations: Abdomen is soft.     Tenderness: There is no abdominal tenderness.  Musculoskeletal:        General: No swelling.     Cervical back: Neck supple.  Lymphadenopathy:     Cervical: No cervical adenopathy.  Skin:    General: Skin is warm and dry.     Findings: No rash.  Neurological:     General: No focal deficit present.     Mental Status: She is alert.  Psychiatric:        Mood and Affect: Mood normal.        Behavior: Behavior normal.     LABORATORY DATA:  CBC    Component Value Date/Time   WBC 4.4 05/06/2022 0845   WBC 6.7 07/25/2021 0111   RBC 4.18 05/06/2022 0845   HGB 12.9 05/06/2022 0845   HGB 15.7 07/07/2021 1538   HCT 38.9 05/06/2022 0845   HCT 46.6 07/07/2021 1538   PLT 319 05/06/2022 0845   PLT 450 07/07/2021 1538   MCV 93.1 05/06/2022 0845   MCV 88 07/07/2021 1538   MCH 30.9 05/06/2022 0845   MCHC 33.2 05/06/2022 0845   RDW 16.8 (H) 05/06/2022 0845   RDW 12.9 07/07/2021 1538   LYMPHSABS 0.7 05/06/2022 0845   LYMPHSABS 1.6 07/07/2021 1538   MONOABS 0.6 05/06/2022 0845   EOSABS 0.1 05/06/2022 0845   EOSABS 0.1 07/07/2021 1538   BASOSABS 0.0 05/06/2022 0845   BASOSABS 0.0 07/07/2021 1538    CMP     Component Value Date/Time   NA 137 05/06/2022 0845   NA 141 07/07/2021 1538   K 4.0 05/06/2022 0845   CL 107 05/06/2022 0845   CO2 25 05/06/2022 0845   GLUCOSE 122 (H) 05/06/2022 0845   BUN 14 05/06/2022 0845   BUN 9 07/07/2021 1538   CREATININE 0.56 05/06/2022 0845   CALCIUM 9.6 05/06/2022 0845   PROT 6.6 05/06/2022 0845   PROT 7.9 07/07/2021 1538   ALBUMIN 3.9 05/06/2022 0845   ALBUMIN 4.6 07/07/2021 1538   AST 14 (L) 05/06/2022 0845   ALT 19 05/06/2022  0845   ALKPHOS 86 05/06/2022 0845   BILITOT 0.4 05/06/2022 0845   GFRNONAA >60 05/06/2022 0845  GFRAA 103 03/05/2020 1119    ASSESSMENT and THERAPY PLAN:   Malignant neoplasm of unspecified part of unspecified bronchus or lung (HCC) This is a 77 year old patient with 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  She also appears to have had some regional adenopathy including prevascular, AP window and left hilar adenopathy.  She has multiple intracranial metastatic lesions which are likely secondary to metastatic lung cancer given presence of regional adenopathy.   She is now status post SRS to the brain lesions. She started concurrent chemoradiation with CarboTaxol on August 30, 2021.  She completed chemoradiation on October 15, 2021.  End of treatment PET/CT reviewed consistent with response in the lung, likely postradiation changes noted. PET also commented on decrease in size of the breast mass.  We started noticing increasing size of the right breast mass while she was on immunotherapy alone.  We have restarted her on weekly CarboTaxol with immunotherapy.   Her right breast mass appears smaller on exam today. Most recent breast imaging also confirms response. PET per my reading with no evidence of  lung ca recurrence. 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 We are awaiting Dr Joellen Jersey recommendations and Dr Liana Gerold recommendations. We will hold chemo for the time being to allow healing and in prep for surgery I will see her back in 4 weeks.  Rachel Moulds MD

## 2022-05-16 ENCOUNTER — Inpatient Hospital Stay: Payer: 59

## 2022-05-16 ENCOUNTER — Other Ambulatory Visit (HOSPITAL_COMMUNITY): Payer: 59

## 2022-05-16 ENCOUNTER — Telehealth: Payer: Self-pay | Admitting: Hematology and Oncology

## 2022-05-16 NOTE — Telephone Encounter (Signed)
Spoke with patient niece confirming upcoming appointments 

## 2022-05-18 NOTE — Progress Notes (Unsigned)
Palliative Medicine [facility]  [phone] [phone]   Name: Jennifer Cowan Date: 05/18/2022 MRN: [id number]  DOB: 1945/07/31  Patient Care Team: [name], FNP as PCP - General (Family Medicine) Pickenpack-Cousar, Arty Baumgartner, NP as Nurse Practitioner (Nurse Practitioner)   INTERVAL HISTORY: Jennifer Cowan is a 77 y.o. 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 Palliative ask to see for symptom management and goals of care.   SOCIAL HISTORY:     reports that she has never smoked. She has never used smokeless tobacco. She reports that she does not drink alcohol and does not use drugs.  ADVANCE DIRECTIVES:  Patient does not have an advanced directive however is interested in completing.   CODE STATUS:   PAST MEDICAL HISTORY: Past Medical History:  Diagnosis Date   Anemia    Arthritis    Breast cancer (HCC)    Cancer (HCC) 1999   Breast Cancer   Family history of breast cancer    Hyperlipidemia    Hypertension    Osteopenia 01/12/2021   Personal history of malignant neoplasm of breast     ALLERGIES:  has No Known Allergies.  MEDICATIONS:  Current Outpatient Medications  Medication Sig Dispense Refill   Accu-Chek Softclix Lancets lancets Use to check blood sugar as directed 4 times a day 100 each 1   acetaminophen (TYLENOL) 500 MG tablet Take 1,000 mg by mouth every 6 (six) hours as needed for moderate pain.     amLODipine (NORVASC) 10 MG tablet TAKE 1 TABLET BY MOUTH DAILY 90 tablet 3   aspirin EC 325 MG tablet Take 1 tablet (325 mg total) by mouth daily. 60 tablet 2   atorvastatin (LIPITOR) 80 MG tablet Take 1 tablet (80 mg total) by mouth daily. 90 tablet 3   benazepril (LOTENSIN) 10 MG tablet TAKE 1 TABLET BY MOUTH DAILY 90 tablet 3   blood glucose meter kit and  supplies KIT Dispense based on patient and insurance preference. Use up to four times daily as directed. 1 each 0   Blood Glucose Monitoring Suppl (ACCU-CHEK GUIDE) w/Device KIT Use as directed 4 times a day 1 kit 0   Cholecalciferol (VITAMIN D3) 125 MCG (5000 UT) CAPS Take 5,000 Units by mouth daily.     gabapentin (NEURONTIN) 100 MG capsule Take 300 mg by mouth 2 (two) times daily.     glucose blood (ACCU-CHEK GUIDE) test strip Use as directed 4 times a day 100 each 1   ketoconazole (NIZORAL) 2 % cream Apply 1 Application topically 2 (two) times daily.     LORazepam (ATIVAN) 0.5 MG tablet 1 tab po 30 minutes prior to radiation or MRI scans 30 tablet 0   meloxicam (MOBIC) 7.5 MG tablet TAKE 1 TABLET BY MOUTH DAILY 90 tablet 3   spironolactone (ALDACTONE) 25 MG tablet Take 0.5 tablets (12.5 mg total) by mouth daily. 45 tablet 3   trolamine salicylate (ASPERCREME) 10 % cream Apply 1 Application topically daily as needed for muscle pain.     Current Facility-Administered Medications  Medication Dose Route Frequency Provider Last Rate Last Admin   heparin lock flush 100 unit/mL  500 Units Intravenous Once Dorothy Puffer, MD       sodium chloride flush (NS) 0.9 % injection 10 mL  10 mL Intravenous Once Dorothy Puffer, MD  Facility-Administered Medications Ordered in Other Visits  Medication Dose Route Frequency Provider Last Rate Last Admin   acetaminophen (TYLENOL) 325 MG tablet            cyanocobalamin (VITAMIN B12) 1000 MCG/ML injection            diphenhydrAMINE (BENADRYL) 25 mg capsule            gadopiclenol (VUEWAY) 0.5 MMOL/ML solution 7.5 mL  7.5 mL Intravenous Once PRN Margaretmary Dys, MD        VITAL SIGNS: There were no vitals taken for this visit. There were no vitals filed for this visit.   Estimated body mass index is 26.26 kg/m as calculated from the following:   Height as of 05/13/22: 5\' 8"  (1.727 m).   Weight as of 05/13/22: 172 lb 11.2 oz (78.3 kg).   PERFORMANCE  STATUS (ECOG) : 2 - Symptomatic, <50% confined to bed   Physical Exam General: NAD, sitting in recliner  Cardiovascular: regular rate and rhythm Pulmonary: normal breathing pattern  Extremities: no edema, no joint deformities Skin: no rashes Neurological: AAO x3, mood appropriate   IMPRESSION: Ms. Loisel presents to clinic today with her daughter for follow-up.  No acute distress noted.  Continues to do well.  Denies nausea, vomiting, constipation, or diarrhea.  She is remaining as active as possible.  Appetite continues to improve with some days being better than others.  Patient is complaining of some pain.  She is currently taking gabapentin 300 mg twice daily.  Refill sent to pharmacy.  Weight remains stable at 171 pounds.  Will continue to closely monitor and support.  Patient and family knows to contact our office as needed.  PLAN: Ongoing goals of care and support. Continues to improve.  Lidocaine patch as needed Gabapentin 300 mg twice daily Will plan to follow-up in 6-8 weeks in collaboration with other oncology appointments.    Patient expressed understanding and was in agreement with this plan. She also understands that She can call the clinic at any time with any questions, concerns, or complaints.     Any controlled substances utilized were prescribed in the context of palliative care. PDMP has been reviewed.    Visit consisted of counseling and education dealing with the complex and emotionally intense issues of symptom management and palliative care in the setting of serious and potentially life-threatening illness.Greater than 50%  of this time was spent counseling and coordinating care related to the above assessment and plan.  Willette Alma, AGPCNP-BC  Palliative Medicine Team/[facility]  *Please note that this is a verbal dictation therefore any spelling or grammatical errors are due to the "[employer] One" system  interpretation.

## 2022-05-19 ENCOUNTER — Inpatient Hospital Stay (HOSPITAL_BASED_OUTPATIENT_CLINIC_OR_DEPARTMENT_OTHER): Payer: 59 | Admitting: Nurse Practitioner

## 2022-05-19 ENCOUNTER — Other Ambulatory Visit (HOSPITAL_COMMUNITY): Payer: Self-pay

## 2022-05-19 ENCOUNTER — Inpatient Hospital Stay: Payer: 59 | Attending: Hematology and Oncology | Admitting: Internal Medicine

## 2022-05-19 ENCOUNTER — Encounter: Payer: Self-pay | Admitting: Nurse Practitioner

## 2022-05-19 ENCOUNTER — Other Ambulatory Visit: Payer: Self-pay

## 2022-05-19 VITALS — BP 134/59 | HR 106 | Temp 97.9°F | Resp 17 | Wt 171.8 lb

## 2022-05-19 DIAGNOSIS — Z171 Estrogen receptor negative status [ER-]: Secondary | ICD-10-CM | POA: Diagnosis not present

## 2022-05-19 DIAGNOSIS — C7931 Secondary malignant neoplasm of brain: Secondary | ICD-10-CM | POA: Diagnosis not present

## 2022-05-19 DIAGNOSIS — C3412 Malignant neoplasm of upper lobe, left bronchus or lung: Secondary | ICD-10-CM | POA: Diagnosis not present

## 2022-05-19 DIAGNOSIS — C3492 Malignant neoplasm of unspecified part of left bronchus or lung: Secondary | ICD-10-CM | POA: Diagnosis not present

## 2022-05-19 DIAGNOSIS — G893 Neoplasm related pain (acute) (chronic): Secondary | ICD-10-CM | POA: Diagnosis not present

## 2022-05-19 DIAGNOSIS — Z515 Encounter for palliative care: Secondary | ICD-10-CM

## 2022-05-19 DIAGNOSIS — Z79899 Other long term (current) drug therapy: Secondary | ICD-10-CM | POA: Diagnosis not present

## 2022-05-19 DIAGNOSIS — R530 Neoplastic (malignant) related fatigue: Secondary | ICD-10-CM | POA: Diagnosis not present

## 2022-05-19 DIAGNOSIS — M792 Neuralgia and neuritis, unspecified: Secondary | ICD-10-CM

## 2022-05-19 MED ORDER — GABAPENTIN 100 MG PO CAPS
300.0000 mg | ORAL_CAPSULE | Freq: Two times a day (BID) | ORAL | 2 refills | Status: DC
Start: 2022-05-19 — End: 2022-06-20
  Filled 2022-05-19: qty 60, 10d supply, fill #0
  Filled 2022-05-31: qty 60, 10d supply, fill #1

## 2022-05-19 NOTE — Progress Notes (Signed)
[facility] at [facility] [address]  [place], [place] [address] [phone]   Interval Evaluation  Date of Service: 05/19/22 Patient Name: Jennifer Cowan Patient MRN: [id number] Patient DOB: 21-Nov-1945 Provider: Henreitta Leber, MD  Identifying Statement:  Jennifer Cowan is a 77 y.o. female with Metastasis to brain [facility])   Primary Cancer:  Oncologic History: Oncology History  Infiltrating duct carcinoma (HCC)  07/25/2021 Initial Diagnosis   Infiltrating duct carcinoma (HCC)   02/27/2022 Genetic Testing   Negative genetic testing on the CancerNext-Expanded+RNAinsight panel.  The report date is February 27, 2022.  The CancerNext-Expanded gene panel offered by [facility] and includes sequencing and rearrangement analysis for the following 77 genes: AIP, ALK, APC*, ATM*, AXIN2, BAP1, BARD1, BLM, BMPR1A, BRCA1*, BRCA2*, BRIP1*, CDC73, CDH1*, CDK4, CDKN1B, CDKN2A, CHEK2*, CTNNA1, DICER1, FANCC, FH, FLCN, GALNT12, KIF1B, LZTR1, MAX, MEN1, MET, MLH1*, MSH2*, MSH3, MSH6*, MUTYH*, NBN, NF1*, NF2, NTHL1, PALB2*, PHOX2B, PMS2*, POT1, PRKAR1A, PTCH1, PTEN*, RAD51C*, RAD51D*, RB1, RECQL, RET, SDHA, SDHAF2, SDHB, SDHC, SDHD, SMAD4, SMARCA4, SMARCB1, SMARCE1, STK11, SUFU, TMEM127, TP53*, TSC1, TSC2, VHL and XRCC2 (sequencing and deletion/duplication); EGFR, EGLN1, HOXB13, KIT, MITF, PDGFRA, POLD1, and POLE (sequencing only); EPCAM and GREM1 (deletion/duplication only). DNA and RNA analyses performed for * genes.    Malignant neoplasm of unspecified part of unspecified bronchus or lung (HCC)  08/13/2021 Initial Diagnosis   Malignant neoplasm of unspecified part of unspecified bronchus or lung (HCC)   08/30/2021 - 09/13/2021 Chemotherapy   Patient is on Treatment Plan : LUNG Carboplatin / Paclitaxel + XRT q7d     08/30/2021 - 10/11/2021 Chemotherapy   Patient is on Treatment Plan : LUNG Carboplatin + Paclitaxel + XRT q7d     11/16/2021 - 02/17/2022 Chemotherapy   Patient  is on Treatment Plan : LUNG Pembrolizumab Q 21D     03/16/2022 - 03/16/2022 Chemotherapy   Patient is on Treatment Plan : BREAST Pembrolizumab (400) D1 + Carboplatin (1.5) weekly + Paclitaxel (80) weekly q42d X 2 cycles / Pembrolizumab (400) D1 + AC D1,22 q42d x 2 cycles     03/18/2022 -  Chemotherapy   Patient is on Treatment Plan : BREAST Pembrolizumab (200) D1 + Carboplatin (2) D1,8,15 + Paclitaxel (45) D1,8,15 q21d X 2 cycles      CNS Oncologic History 08/20/21: Completes SRS to 6 metastases, including frx to large R temporal and L frontal lesions Mitzi Hansen)  Interval History: Jennifer Cowan presents today for evaluation following recent MRI brain.  She continues to deny focal neurolgic complaints.  No new or progressive changes. No weakness, numbness, speech difficulty.  Has recovered from shingles, Gabapentin 300mg  BID has helped with the pain in the skin and shoulder.  Has held off on further chemotherapy with Dr. Al Pimple, may have surgery upcoming.  No headaches or seizures.  Medications: Current Outpatient Medications on File Prior to Visit  Medication Sig Dispense Refill   Accu-Chek Softclix Lancets lancets Use to check blood sugar as directed 4 times a day 100 each 1   acetaminophen (TYLENOL) 500 MG tablet Take 1,000 mg by mouth every 6 (six) hours as needed for moderate pain.     amLODipine (NORVASC) 10 MG tablet TAKE 1 TABLET BY MOUTH DAILY 90 tablet 3   aspirin EC 325 MG tablet Take 1 tablet (325 mg total) by mouth daily. 60 tablet 2   atorvastatin (LIPITOR) 80 MG tablet Take 1 tablet (80 mg total) by mouth daily. 90 tablet 3  benazepril (LOTENSIN) 10 MG tablet TAKE 1 TABLET BY MOUTH DAILY 90 tablet 3   blood glucose meter kit and supplies KIT Dispense based on patient and insurance preference. Use up to four times daily as directed. 1 each 0   Blood Glucose Monitoring Suppl (ACCU-CHEK GUIDE) w/Device KIT Use as directed 4 times a day 1 kit 0   Cholecalciferol (VITAMIN D3) 125 MCG  (5000 UT) CAPS Take 5,000 Units by mouth daily.     gabapentin (NEURONTIN) 100 MG capsule Take 300 mg by mouth 2 (two) times daily.     glucose blood (ACCU-CHEK GUIDE) test strip Use as directed 4 times a day 100 each 1   ketoconazole (NIZORAL) 2 % cream Apply 1 Application topically 2 (two) times daily.     LORazepam (ATIVAN) 0.5 MG tablet 1 tab po 30 minutes prior to radiation or MRI scans 30 tablet 0   meloxicam (MOBIC) 7.5 MG tablet TAKE 1 TABLET BY MOUTH DAILY 90 tablet 3   spironolactone (ALDACTONE) 25 MG tablet Take 0.5 tablets (12.5 mg total) by mouth daily. 45 tablet 3   trolamine salicylate (ASPERCREME) 10 % cream Apply 1 Application topically daily as needed for muscle pain.     Current Facility-Administered Medications on File Prior to Visit  Medication Dose Route Frequency Provider Last Rate Last Admin   acetaminophen (TYLENOL) 325 MG tablet            cyanocobalamin (VITAMIN B12) 1000 MCG/ML injection            diphenhydrAMINE (BENADRYL) 25 mg capsule            gadopiclenol (VUEWAY) 0.5 MMOL/ML solution 7.5 mL  7.5 mL Intravenous Once PRN Margaretmary Dys, MD       heparin lock flush 100 unit/mL  500 Units Intravenous Once Dorothy Puffer, MD       sodium chloride flush (NS) 0.9 % injection 10 mL  10 mL Intravenous Once Dorothy Puffer, MD        Allergies: No Known Allergies Past Medical History:  Past Medical History:  Diagnosis Date   Anemia    Arthritis    Breast cancer (HCC)    Cancer (HCC) 1999   Breast Cancer   Family history of breast cancer    Hyperlipidemia    Hypertension    Osteopenia 01/12/2021   Personal history of malignant neoplasm of breast    Past Surgical History:  Past Surgical History:  Procedure Laterality Date   BREAST SURGERY  1999   Masectomy- left   BRONCHIAL BIOPSY  07/30/2021   Procedure: BRONCHIAL BIOPSIES;  Surgeon: Leslye Peer, MD;  Location: [facility] ENDOSCOPY;  Service: Pulmonary;;   BRONCHIAL BRUSHINGS  07/30/2021   Procedure: BRONCHIAL  BRUSHINGS;  Surgeon: Leslye Peer, MD;  [facility];  Service: Pulmonary;;   BRONCHIAL NEEDLE ASPIRATION BIOPSY  07/30/2021   Procedure: BRONCHIAL NEEDLE ASPIRATION BIOPSIES;  Surgeon: Leslye Peer, MD;  Location: [facility] ENDOSCOPY;  Service: Pulmonary;;   CYSTECTOMY     cyst removed off top of head   IR IMAGING GUIDED PORT INSERTION  08/20/2021   MASTECTOMY     VIDEO BRONCHOSCOPY WITH RADIAL ENDOBRONCHIAL ULTRASOUND  07/30/2021   Procedure: VIDEO BRONCHOSCOPY WITH RADIAL ENDOBRONCHIAL ULTRASOUND;  Surgeon: Leslye Peer, MD;  Location: [facility] ENDOSCOPY;  Service: Pulmonary;;   Social History:  Social History   Socioeconomic History   Marital status: Single    Spouse name: Not on file   Number of children:  0   Years of education: 12   Highest education level: High school graduate  Occupational History   Occupation: retired  Tobacco Use   Smoking status: Never   Smokeless tobacco: Never  Vaping Use   Vaping Use: Never used  Substance and Sexual Activity   Alcohol use: No   Drug use: Never   Sexual activity: Yes    Birth control/protection: Post-menopausal  Other Topics Concern   Not on file  Social History Narrative   Not on file   Social Determinants of Health   Financial Resource Strain: Not on file  Food Insecurity: Not on file  Transportation Needs: Not on file  Physical Activity: Not on file  Stress: Not on file  Social Connections: Not on file  Intimate Partner Violence: Not on file   Family History:  Family History  Problem Relation Age of Onset   Arthritis Mother    Breast cancer Sister 43   Diabetes Sister    Hyperlipidemia Sister    Hypertension Sister    Stroke Sister    Dementia Sister    Diabetes Brother    Hypertension Brother    Breast cancer Niece 63    Review of Systems: Constitutional: Doesn't report fevers, chills or abnormal weight loss Eyes: Doesn't report blurriness of vision Ears, nose, mouth, throat, and face: Doesn't report  sore throat Respiratory: Doesn't report cough, dyspnea or wheezes Cardiovascular: Doesn't report palpitation, chest discomfort  Gastrointestinal:  Doesn't report nausea, constipation, diarrhea GU: Doesn't report incontinence Skin: Doesn't report skin rashes Neurological: Per HPI Musculoskeletal: Doesn't report joint pain Behavioral/Psych: Doesn't report anxiety  Physical Exam: Vitals:   05/19/22 1217  BP: (!) 134/59  Pulse: (!) 106  Resp: 17  Temp: 97.9 F (36.6 C)  SpO2: 99%    KPS: 70. General: Alert, cooperative, pleasant, in no acute distress Head: Normal EENT: No conjunctival injection or scleral icterus.  Lungs: Resp effort normal Cardiac: Regular rate Abdomen: Non-distended abdomen Skin: No rashes cyanosis or petechiae. Extremities: No clubbing or edema  Neurologic Exam: Mental Status: Awake, alert, attentive to examiner. Oriented to self and environment. Language is fluent with intact comprehension.  Pyschomotor slowing, impaired recall Cranial Nerves: Visual acuity is grossly normal. Visual fields are full. Extra-ocular movements intact. No ptosis. Face is symmetric Motor: Tone and bulk are normal. Power is full in both arms and legs. Reflexes are symmetric, no pathologic reflexes present.  Sensory: Intact to light touch Gait: deferred   Labs: I have reviewed the data as listed    Component Value Date/Time   NA 137 05/06/2022 0845   NA 141 07/07/2021 1538   K 4.0 05/06/2022 0845   CL 107 05/06/2022 0845   CO2 25 05/06/2022 0845   GLUCOSE 122 (H) 05/06/2022 0845   BUN 14 05/06/2022 0845   BUN 9 07/07/2021 1538   CREATININE 0.56 05/06/2022 0845   CALCIUM 9.6 05/06/2022 0845   PROT 6.6 05/06/2022 0845   PROT 7.9 07/07/2021 1538   ALBUMIN 3.9 05/06/2022 0845   ALBUMIN 4.6 07/07/2021 1538   AST 14 (L) 05/06/2022 0845   ALT 19 05/06/2022 0845   ALKPHOS 86 05/06/2022 0845   BILITOT 0.4 05/06/2022 0845   GFRNONAA >60 05/06/2022 0845   GFRAA 103  03/05/2020 1119   Lab Results  Component Value Date   WBC 4.4 05/06/2022   NEUTROABS 3.0 05/06/2022   HGB 12.9 05/06/2022   HCT 38.9 05/06/2022   MCV 93.1 05/06/2022   PLT 319 05/06/2022  Imaging:  [facility] Clinician Interpretation: I have personally reviewed the CNS images as listed.  My interpretation, in the context of the patient's clinical presentation, is treatment effect vs true progression  MR BRAIN W WO CONTRAST  Result Date: 05/16/2022 CLINICAL DATA:  Brain/CNS neoplasm, assess treatment response EXAM: MRI HEAD WITHOUT AND WITH CONTRAST TECHNIQUE: Multiplanar, multiecho pulse sequences of the brain and surrounding structures were obtained without and with intravenous contrast. CONTRAST:  8 mL Vueway COMPARISON:  MRI head 01/13/2022. FINDINGS: Brain: Interval increase in size of a peripherally enhancing hemorrhagic lesion the right temporal lobe which measures 2.7 x 2.7 cm. Increased surrounding edema. No substantial change in a 9 mm high left parafalcine lesion with similar surrounding edema. Subcentimeter left occipital lobe lesion is no longer enhancing. Possible left perirolandic lesion previously seen is also not visualized. No new lesions identified. Similar enlargement of the pituitary gland with suprasellar extension. No evidence of acute infarct, hemorrhage outside of the above lesion, midline shift or hydrocephalus. Vascular: Major arterial flow voids are maintained skull base. Skull and upper cervical spine: Normal marrow signal. Sinuses/Orbits: Negative. Other: None. IMPRESSION: 1. Interval increase and size of a hemorrhagic, peripherally enhancing 2.7 cm metastasis in the right temporal lobe with increased surrounding edema. 2. Other lesions are either similar or decreased in size. 3. Similar pituitary mass. Electronically Signed   By: Feliberto Harts M.D.   On: 05/16/2022 14:53   NM PET Image Restage (PS) Skull Base to Thigh (F-18 FDG)  Result Date: 05/08/2022 CLINICAL  DATA:  Subsequent treatment strategy for metastatic lung cancer and right breast cancer. EXAM: NUCLEAR MEDICINE PET SKULL BASE TO THIGH TECHNIQUE: 8.5 mCi F-18 FDG was injected intravenously. Full-ring PET imaging was performed from the skull base to thigh after the radiotracer. CT data was obtained and used for attenuation correction and anatomic localization. Fasting blood glucose: 57 mg/dl COMPARISON:  CT chest dated 02/24/2022.  PET-CT dated 01/03/2022. FINDINGS: Mediastinal blood pool activity: SUV max 2.8 Liver activity: SUV max NA NECK: No hypermetabolic cervical lymphadenopathy. Incidental CT findings: None. CHEST: Radiation changes in the left upper lobe/paramediastinal region. No focal hypermetabolism to suggest residual/recurrent tumor. 9 mm right lower lobe nodule (series 7/image 33), grossly unchanged, non FDG avid. Prior left pleural effusion has resolved. 3.1 cm soft tissue mass in the central right breast (series 4/image 92), previously 5.4 cm, max SUV 3.9. No hypermetabolic thoracic lymphadenopathy. Right chest port terminates at the cavoatrial junction. Incidental CT findings: Mild atherosclerotic calcifications of the aortic arch. Moderate coronary atherosclerosis the LAD and left circumflex. ABDOMEN/PELVIS: No abnormal hypermetabolism in the liver, spleen, pancreas, or adrenal glands. No hypermetabolic abdominopelvic lymphadenopathy. Incidental CT findings: 5.0 cm cyst in the caudate (series 4/image 102), benign. 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 Nonobstructing bilateral renal calculi measuring up to 2.2 cm in the right upper pole (series 4/image 113). 14 mm right lower pole renal cyst (series 4/image 123), benign (Bosniak I). Atherosclerotic calcifications of the abdominal aorta and branch vessels. Uterine fibroids. SKELETON: No focal hypermetabolic activity to suggest skeletal metastasis. Incidental CT findings: Degenerative changes of the visualized thoracolumbar spine. IMPRESSION:  Radiation changes in the left upper lobe/paramediastinal region in this patient with lung cancer. No focal hypermetabolism to suggest residual/recurrent tumor. Improving central right breast mass in this patient with right breast cancer. No findings to suggest metastatic disease. Stable right lower lobe pulmonary nodule without hypermetabolism. Additional stable ancillary findings as above. Electronically Signed   By: Roselie Awkward.D.  On: 05/08/2022 16:13   MM DIAG BREAST TOMO UNI RIGHT  Result Date: 05/02/2022 CLINICAL DATA:  77 year old female with biopsy-proven RIGHT breast malignancy, evaluate treatment response. Patient also with known lung cancer and brain metastases. EXAM: DIGITAL DIAGNOSTIC UNILATERAL RIGHT MAMMOGRAM WITH TOMOSYNTHESIS; ULTRASOUND RIGHT BREAST LIMITED TECHNIQUE: Right digital diagnostic mammography and breast tomosynthesis was performed.; Targeted ultrasound examination of the right breast was performed COMPARISON:  Previous exam(s). ACR Breast Density Category b: There are scattered areas of fibroglandular density. FINDINGS: 2D/3D spot compression views of the RIGHT breast demonstrate decrease in size biopsy-proven malignancy within the RETROAREOLAR RIGHT breast. No other changes are noted. Targeted ultrasound is performed, showing a 3.7 x 2.4 x 1.8 cm irregular hypoechoic mass at the 3 o'clock position of the RIGHT breast 3 cm from the nipple, previously measuring 4.9 x 5 x 3.9 cm on 02/28/2022. No abnormal RIGHT axillary lymph nodes are noted. IMPRESSION: 1. Treatment response/decrease in size of biopsy-proven INNER RIGHT breast malignancy, now measuring 3.7 x 2.4 x 1.8 cm, previously measuring 4.9 x 5 x 3.9 cm on 02/28/2022. No abnormal appearing RIGHT axillary lymph nodes. RECOMMENDATION: Treatment plan I have discussed the findings and recommendations with the patient. If applicable, a reminder letter will be sent to the patient regarding the next appointment. BI-RADS  CATEGORY  6: Known biopsy-proven malignancy. Electronically Signed   By: Harmon Pier M.D.   On: 05/02/2022 10:00  [name] LIMITED ULTRASOUND INCLUDING AXILLA RIGHT BREAST  Result Date: 05/02/2022 CLINICAL DATA:  77 year old female with biopsy-proven RIGHT breast malignancy, evaluate treatment response. Patient also with known lung cancer and brain metastases. EXAM: DIGITAL DIAGNOSTIC UNILATERAL RIGHT MAMMOGRAM WITH TOMOSYNTHESIS; ULTRASOUND RIGHT BREAST LIMITED TECHNIQUE: Right digital diagnostic mammography and breast tomosynthesis was performed.; Targeted ultrasound examination of the right breast was performed COMPARISON:  Previous exam(s). ACR Breast Density Category b: There are scattered areas of fibroglandular density. FINDINGS: 2D/3D spot compression views of the RIGHT breast demonstrate decrease in size biopsy-proven malignancy within the RETROAREOLAR RIGHT breast. No other changes are noted. Targeted ultrasound is performed, showing a 3.7 x 2.4 x 1.8 cm irregular hypoechoic mass at the 3 o'clock position of the RIGHT breast 3 cm from the nipple, previously measuring 4.9 x 5 x 3.9 cm on 02/28/2022. No abnormal RIGHT axillary lymph nodes are noted. IMPRESSION: 1. Treatment response/decrease in size of biopsy-proven INNER RIGHT breast malignancy, now measuring 3.7 x 2.4 x 1.8 cm, previously measuring 4.9 x 5 x 3.9 cm on 02/28/2022. No abnormal appearing RIGHT axillary lymph nodes. RECOMMENDATION: Treatment plan I have discussed the findings and recommendations with the patient. If applicable, a reminder letter will be sent to the patient regarding the next appointment. BI-RADS CATEGORY  6: Known biopsy-proven malignancy. Electronically Signed   By: Harmon Pier M.D.   On: 05/02/2022 10:00  ECHOCARDIOGRAM COMPLETE  Result Date: 04/19/2022    ECHOCARDIOGRAM REPORT   Patient Name:   Jennifer Cowan Date of Exam: 04/19/2022 Medical Rec #:  [id number]     Height:       68.0 in Accession #:    [id number]    Weight:        169.9 lb Date of Birth:  November 24, 1945     BSA:          1.907 m Patient Age:    76 years      BP:           119/64 mmHg Patient Gender: F  HR:           84 bpm. Exam Location:  Outpatient Procedure: 2D Echo, Cardiac Doppler and Color Doppler Indications:    CHF, chemo  History:        Patient has prior history of Echocardiogram examinations.                 Stroke; Risk Factors:Hypertension, Diabetes and Dyslipidemia.  Sonographer:    Mike Gip Referring Phys: [id number] DANIEL R BENSIMHON  Sonographer Comments: Global longitudinal strain was attempted. IMPRESSIONS  1. Left ventricular ejection fraction, by estimation, is 65 to 70%. The left ventricle has normal function. The left ventricle has no regional wall motion abnormalities. There is mild concentric left ventricular hypertrophy. Left ventricular diastolic parameters are consistent with Grade I diastolic dysfunction (impaired relaxation).  2. Right ventricular systolic function is normal. The right ventricular size is normal.  3. Left atrial size was mildly dilated.  4. Pericardial effusion has essentially resolved since last study.  5. The mitral valve is normal in structure. Trivial mitral valve regurgitation. No evidence of mitral stenosis.  6. The aortic valve is tricuspid. There is mild calcification of the aortic valve. Aortic valve regurgitation is not visualized. No aortic stenosis is present.  7. The inferior vena cava is normal in size with greater than 50% respiratory variability, suggesting right atrial pressure of 3 mmHg. Comparison(s): Prior images reviewed side by side. Pericardial effusion has essentially resolved since last study. FINDINGS  Left Ventricle: Left ventricular ejection fraction, by estimation, is 65 to 70%. The left ventricle has normal function. The left ventricle has no regional wall motion abnormalities. The left ventricular internal cavity size was normal in size. There is  mild concentric left ventricular  hypertrophy. Left ventricular diastolic parameters are consistent with Grade I diastolic dysfunction (impaired relaxation). Right Ventricle: The right ventricular size is normal. No increase in right ventricular wall thickness. Right ventricular systolic function is normal. Left Atrium: Left atrial size was mildly dilated. Right Atrium: Right atrial size was normal in size. Pericardium: Pericardial effusion has essentially resolved since last study. Trivial pericardial effusion is present. The pericardial effusion is posterior to the left ventricle. Mitral Valve: The mitral valve is normal in structure. Mild mitral annular calcification. Trivial mitral valve regurgitation. No evidence of mitral valve stenosis. Tricuspid Valve: The tricuspid valve is normal in structure. Tricuspid valve regurgitation is trivial. No evidence of tricuspid stenosis. Aortic Valve: The aortic valve is tricuspid. There is mild calcification of the aortic valve. Aortic valve regurgitation is not visualized. No aortic stenosis is present. Pulmonic Valve: The pulmonic valve was normal in structure. Pulmonic valve regurgitation is not visualized. No evidence of pulmonic stenosis. Aorta: The aortic root is normal in size and structure. Venous: The inferior vena cava is normal in size with greater than 50% respiratory variability, suggesting right atrial pressure of 3 mmHg. IAS/Shunts: No atrial level shunt detected by color flow Doppler.  LEFT VENTRICLE PLAX 2D LVIDd:         4.20 cm     Diastology LVIDs:         2.60 cm     LV e' medial:    8.16 cm/s LV PW:         1.10 cm     LV E/e' medial:  8.3 LV IVS:        1.10 cm     LV e' lateral:   8.05 cm/s LVOT diam:     1.90 cm  LV E/e' lateral: 8.4 LV SV:         50 LV SV Index:   26 LVOT Area:     2.84 cm  LV Volumes (MOD) LV vol d, MOD A4C: 79.4 ml LV vol s, MOD A4C: 33.7 ml LV SV MOD A4C:     79.4 ml RIGHT VENTRICLE             IVC RV Basal diam:  3.60 cm     IVC diam: 1.40 cm RV S prime:      14.80 cm/s TAPSE (M-mode): 1.6 cm LEFT ATRIUM             Index        RIGHT ATRIUM           Index LA diam:        3.60 cm 1.89 cm/m   RA Area:     14.30 cm LA Vol (A2C):   49.8 ml 26.12 ml/m  RA Volume:   32.50 ml  17.04 ml/m LA Vol (A4C):   49.3 ml 25.85 ml/m LA Biplane Vol: 53.1 ml 27.85 ml/m  AORTIC VALVE LVOT Vmax:   93.80 cm/s LVOT Vmean:  66.000 cm/s LVOT VTI:    0.176 m  AORTA Ao Root diam: 3.40 cm Ao Asc diam:  3.00 cm MITRAL VALVE MV Area (PHT): 3.05 cm    SHUNTS MV Decel Time: 249 msec    Systemic VTI:  0.18 m MV E velocity: 67.40 cm/s  Systemic Diam: 1.90 cm MV A velocity: 71.90 cm/s MV E/A ratio:  0.94 Arvilla Meres MD Electronically signed by Arvilla Meres MD Signature Date/Time: 04/19/2022/4:24:49 PM    Final      Assessment/Plan Metastasis to brain [facility])  Jennifer Cowan is clinically stable today.  MRI brain demonstrates progression of disease within previously treated right temporal lesion. Etiology is favored radiation necrosis over neoplasm, due to very large initial lesion, timing of progression, concurrent immunotherapy.  We recommended close MRI surveillance, with repeat study in 6 weeks.  Will hold off on steroids given lack of symptoms and immunotherapy need.  We recommended continuing Aspirin 325mg  daily for stroke prevention, she will also continue high dose statin.    We appreciate the opportunity to participate in the care of Jennifer Cowan.   We ask that Jennifer Cowan return to clinic in 1 months following next brain MRI, or sooner as needed.  All questions were answered. The patient knows to call the clinic with any problems, questions or concerns. No barriers to learning were detected.  The total time spent in the encounter was 40 minutes and more than 50% was on counseling and review of test results   Henreitta Leber, MD Medical Director of Neuro-Oncology [facility] at [facility] 05/19/22 12:23 PM

## 2022-05-20 ENCOUNTER — Encounter: Payer: Self-pay | Admitting: Genetic Counselor

## 2022-05-20 ENCOUNTER — Ambulatory Visit: Payer: Self-pay | Admitting: Genetic Counselor

## 2022-05-20 DIAGNOSIS — Z1379 Encounter for other screening for genetic and chromosomal anomalies: Secondary | ICD-10-CM

## 2022-05-20 NOTE — Progress Notes (Signed)
HPI:  [name] was previously seen in the  Cancer Genetics clinic due to a personal and family history of breast cancer and concerns regarding a hereditary predisposition to cancer. Please refer to our prior cancer genetics clinic note for more information regarding our discussion, assessment and recommendations, at the time. [name] recent genetic test results were disclosed to her, as were recommendations warranted by these results. These results and recommendations are discussed in more detail below.  CANCER HISTORY:  Oncology History  Infiltrating duct carcinoma (HCC)  07/25/2021 Initial Diagnosis   Infiltrating duct carcinoma (HCC)   02/27/2022 Genetic Testing   Negative genetic testing on the CancerNext-Expanded+RNAinsight panel.  The report date is February 27, 2022.  The CancerNext-Expanded gene panel offered by [facility] and includes sequencing and rearrangement analysis for the following 77 genes: AIP, ALK, APC*, ATM*, AXIN2, BAP1, BARD1, BLM, BMPR1A, BRCA1*, BRCA2*, BRIP1*, CDC73, CDH1*, CDK4, CDKN1B, CDKN2A, CHEK2*, CTNNA1, DICER1, FANCC, FH, FLCN, GALNT12, KIF1B, LZTR1, MAX, MEN1, MET, MLH1*, MSH2*, MSH3, MSH6*, MUTYH*, NBN, NF1*, NF2, NTHL1, PALB2*, PHOX2B, PMS2*, POT1, PRKAR1A, PTCH1, PTEN*, RAD51C*, RAD51D*, RB1, RECQL, RET, SDHA, SDHAF2, SDHB, SDHC, SDHD, SMAD4, SMARCA4, SMARCB1, SMARCE1, STK11, SUFU, TMEM127, TP53*, TSC1, TSC2, VHL and XRCC2 (sequencing and deletion/duplication); EGFR, EGLN1, HOXB13, KIT, MITF, PDGFRA, POLD1, and POLE (sequencing only); EPCAM and GREM1 (deletion/duplication only). DNA and RNA analyses performed for * genes.    Malignant neoplasm of unspecified part of unspecified bronchus or lung (HCC)  08/13/2021 Initial Diagnosis   Malignant neoplasm of unspecified part of unspecified bronchus or lung (HCC)   08/30/2021 - 09/13/2021 Chemotherapy   Patient is on Treatment Plan : LUNG Carboplatin / Paclitaxel + XRT q7d     08/30/2021 - 10/11/2021  Chemotherapy   Patient is on Treatment Plan : LUNG Carboplatin + Paclitaxel + XRT q7d     11/16/2021 - 02/17/2022 Chemotherapy   Patient is on Treatment Plan : LUNG Pembrolizumab Q 21D     03/16/2022 - 03/16/2022 Chemotherapy   Patient is on Treatment Plan : BREAST Pembrolizumab (400) D1 + Carboplatin (1.5) weekly + Paclitaxel (80) weekly q42d X 2 cycles / Pembrolizumab (400) D1 + AC D1,22 q42d x 2 cycles     03/18/2022 -  Chemotherapy   Patient is on Treatment Plan : BREAST Pembrolizumab (200) D1 + Carboplatin (2) D1,8,15 + Paclitaxel (45) D1,8,15 q21d X 2 cycles       FAMILY HISTORY:  We obtained a detailed, 4-generation family history.  Significant diagnoses are listed below: Family History  Problem Relation Age of Onset   Arthritis Mother    Breast cancer Sister 63   Diabetes Sister    Hyperlipidemia Sister    Hypertension Sister    Stroke Sister    Dementia Sister    Diabetes Brother    Hypertension Brother    Breast cancer Niece 31       The patient does not have children.  She has two brothers and six sisters.  One brother has a daughter who had breast cancer at 55.  One sister developed breast cancer at 13.  Both parents are deceased.   The patient's mother died at 78 with dementia.  She was an only child.  Her parents died of natural causes.   The patient's father died at 67.  He had several siblings, one had lung cancer.  There is no other reported family history of cancer.   [name] is unaware of previous family history of genetic testing for  hereditary cancer risks. There is no reported Ashkenazi Jewish ancestry. There is no known consanguinity  GENETIC TEST RESULTS: Genetic testing reported out on February 27, 2022 through the CancerNext-Expanded+RNAinsight cancer panel found no pathogenic mutations. The CancerNext-Expanded gene panel offered by [facility] and includes sequencing and rearrangement analysis for the following 77 genes: AIP, ALK, APC*, ATM*, AXIN2,  BAP1, BARD1, BMPR1A, BRCA1*, BRCA2*, BRIP1*, CDC73, CDH1*, CDK4, CDKN1B, CDKN2A, CHEK2*, CTNNA1, DICER1, FH, FLCN, KIF1B, LZTR1, MAX, MEN1, MET, MLH1*, MSH2*, MSH3, MSH6*, MUTYH*, NF1*, NF2, NTHL1, PALB2*, PHOX2B, PMS2*, POT1, PRKAR1A, PTCH1, PTEN*, RAD51C*, RAD51D*, RB1, RET, SDHA, SDHAF2, SDHB, SDHC, SDHD, SMAD4, SMARCA4, SMARCB1, SMARCE1, STK11, SUFU, TMEM127, TP53*, TSC1, TSC2, and VHL (sequencing and deletion/duplication); EGFR, EGLN1, HOXB13, KIT, MITF, PDGFRA, POLD1, and POLE (sequencing only); EPCAM and GREM1 (deletion/duplication only). DNA and RNA analyses performed for * genes. The test report has been scanned into EPIC and is located under the Molecular Pathology section of the Results Review tab.  A portion of the result report is included below for reference.     We discussed with [name] that because current genetic testing is not perfect, it is possible there may be a gene mutation in one of these genes that current testing cannot detect, but that chance is small.  We also discussed, that there could be another gene that has not yet been discovered, or that we have not yet tested, that is responsible for the cancer diagnoses in the family. It is also possible there is a hereditary cause for the cancer in the family that [name] did not inherit and therefore was not identified in her testing.  Therefore, it is important to remain in touch with cancer genetics in the future so that we can continue to offer [name] the most up to date genetic testing.   ADDITIONAL GENETIC TESTING: We discussed with [name] that her genetic testing was fairly extensive.  If there are genes identified to increase cancer risk that can be analyzed in the future, we would be happy to discuss and coordinate this testing at that time.    CANCER SCREENING RECOMMENDATIONS: [name] test result is considered negative (normal).  This means that we have not identified a hereditary cause for her personal and  family history of breast cancer at this time. Most cancers happen by chance and this negative test suggests that her cancer may fall into this category.    While reassuring, this does not definitively rule out a hereditary predisposition to cancer. It is still possible that there could be genetic mutations that are undetectable by current technology. There could be genetic mutations in genes that have not been tested or identified to increase cancer risk.  Therefore, it is recommended she continue to follow the cancer management and screening guidelines provided by her oncology and primary healthcare provider.   An individual's cancer risk and medical management are not determined by genetic test results alone. Overall cancer risk assessment incorporates additional factors, including personal medical history, family history, and any available genetic information that may result in a personalized plan for cancer prevention and surveillance  RECOMMENDATIONS FOR FAMILY MEMBERS:  Individuals in this family might be at some increased risk of developing cancer, over the general population risk, simply due to the family history of cancer.  We recommended women in this family have a yearly mammogram beginning at age 19, or 62 years younger than the earliest onset of cancer, an annual clinical breast exam, and perform monthly  breast self-exams. Women in this family should also have a gynecological exam as recommended by their primary provider. All family members should be referred for colonoscopy starting at age 18.  FOLLOW-UP: Lastly, we discussed with [name] that cancer genetics is a rapidly advancing field and it is possible that new genetic tests will be appropriate for her and/or her family members in the future. We encouraged her to remain in contact with cancer genetics on an annual basis so we can update her personal and family histories and let her know of advances in cancer genetics that may benefit  this family.   Our contact number was provided. [name] questions were answered to her satisfaction, and she knows she is welcome to call us at anytime with additional questions or concerns.   Maylon Cos, MS, [facility] Licensed, Certified Genetic Counselor [email or web address]

## 2022-05-23 ENCOUNTER — Inpatient Hospital Stay: Payer: 59

## 2022-05-24 ENCOUNTER — Telehealth: Payer: Self-pay | Admitting: Internal Medicine

## 2022-05-24 NOTE — Telephone Encounter (Signed)
Scheduled per 05/02 los, patient has been called and notified of upcoming appointments.

## 2022-05-25 NOTE — Therapy (Addendum)
OUTPATIENT PHYSICAL THERAPY BREAST CANCER BASELINE EVALUATION   Patient Name: Jennifer Cowan MRN: [id number] DOB:December 03, 1945, 77 y.o., female Today's Date: 05/26/2022  END OF SESSION:  PT End of Session - 05/26/22 1250     Visit Number 1    Number of Visits 2    Date for PT Re-Evaluation 07/07/22    PT Start Time 1200    PT Stop Time 1245    PT Time Calculation (min) 45 min    Activity Tolerance Patient tolerated treatment well    Behavior During Therapy [facility] for tasks assessed/performed             Past Medical History:  Diagnosis Date   Anemia    Arthritis    Breast cancer (HCC)    Cancer (HCC) 1999   Breast Cancer   Family history of breast cancer    Hyperlipidemia    Hypertension    Osteopenia 01/12/2021   Personal history of malignant neoplasm of breast    Past Surgical History:  Procedure Laterality Date   BREAST SURGERY  1999   Masectomy- left   BRONCHIAL BIOPSY  07/30/2021   Procedure: BRONCHIAL BIOPSIES;  Surgeon: Leslye Peer, MD;  Location: [facility] ENDOSCOPY;  Service: Pulmonary;;   BRONCHIAL BRUSHINGS  07/30/2021   Procedure: BRONCHIAL BRUSHINGS;  Surgeon: Leslye Peer, MD;  Location: [facility] ENDOSCOPY;  Service: Pulmonary;;   BRONCHIAL NEEDLE ASPIRATION BIOPSY  07/30/2021   Procedure: BRONCHIAL NEEDLE ASPIRATION BIOPSIES;  Surgeon: Leslye Peer, MD;  Location: [facility] ENDOSCOPY;  Service: Pulmonary;;   CYSTECTOMY     cyst removed off top of head   IR IMAGING GUIDED PORT INSERTION  08/20/2021   MASTECTOMY     VIDEO BRONCHOSCOPY WITH RADIAL ENDOBRONCHIAL ULTRASOUND  07/30/2021   Procedure: VIDEO BRONCHOSCOPY WITH RADIAL ENDOBRONCHIAL ULTRASOUND;  Surgeon: Leslye Peer, MD;  Location: [facility] ENDOSCOPY;  Service: Pulmonary;;   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   Type 2 diabetes mellitus with hyperglycemia, without long-term current use of  insulin (HCC) 10/06/2021   Port-A-Cath in place 08/30/2021   Hyperglycemia 08/30/2021   Non-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) 08/13/2021   Malignant neoplasm of unspecified part of unspecified bronchus or lung (HCC) 08/13/2021   Physical deconditioning 07/27/2021   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   Mass of upper lobe of left lung 07/25/2021   Metastasis to brain (HCC) 07/24/2021   Overweight 07/07/2021   Osteopenia 01/12/2021   History of breast cancer 03/05/2020   Hypertension associated with type 2 diabetes mellitus (HCC) 06/26/2018   Iron deficiency anemia 06/26/2018   Hyperlipidemia associated with type 2 diabetes mellitus (HCC) 06/26/2018   Primary osteoarthritis involving multiple joints 06/26/2018    PCP: Harlow Mares, NP  REFERRING PROVIDER: Dr. Donell Beers  REFERRING DIAG: Rt breast cancer  THERAPY DIAG:  History of breast cancer  Metastasis to brain (HCC)  Non-small cell cancer of left lung [facility])  Postmastectomy lymphedema  Rationale for Evaluation and Treatment: Rehabilitation  ONSET DATE: 07/2021  SUBJECTIVE:  SUBJECTIVE STATEMENT: Patient reports she is here today to be seen by her medical team for her newly diagnosed right breast cancer.     PERTINENT HISTORY:  Patient was diagnosed in 2023 with Rt breast cancer but then was discovered to have brain lesions due to metastatic lung cancer.  She was treated with XRT to the brain and carbotaxol chemotherapy and immunotherapy. No active disease noted on recent PET.  Will now have breast cancer treated with mastectomy or lumpectomy with SLNB. Hx of Lt mastectomy in 1995 - node status unknown. Hx of fall a few years ago and the left arm being larger ever since.   PATIENT GOALS:   reduce lymphedema risk and learn post op  HEP.   PAIN:  Are you having pain? No   PRECAUTIONS: Active CA, metastatic lung cancer, lymphedema Lt UE, lymphedema risk Rt UE  HAND DOMINANCE: right  WEIGHT BEARING RESTRICTIONS: No  FALLS:  Has patient fallen in last 6 months? No  LIVING ENVIRONMENT: Patient lives with: sister Has following equipment at home: None  OCCUPATION: Retired   LEISURE: I do walk - 3 miles a few times per week.    PRIOR LEVEL OF FUNCTION: Independent   OBJECTIVE:  COGNITION: Overall cognitive status: Within functional limits for tasks assessed    POSTURE:  Forward head and rounded shoulders posture  UPPER EXTREMITY AROM/PROM:  A/PROM RIGHT   eval   Shoulder extension 45  Shoulder flexion 115  Shoulder abduction 142  Shoulder internal rotation   Shoulder external rotation 85    (Blank rows = not tested)  A/PROM LEFT   eval  Shoulder extension 30  Shoulder flexion 120  Shoulder abduction 115  Shoulder internal rotation   Shoulder external rotation 70    (Blank rows = not tested)  UPPER EXTREMITY STRENGTH: not tested  LYMPHEDEMA ASSESSMENTS:   LANDMARK RIGHT   eval  10 cm proximal to olecranon process 28.2  Olecranon process 25  10 cm proximal to ulnar styloid process 20.3  Just proximal to ulnar styloid process 16.5  Across hand at thumb web space 20  At base of 2nd digit 6.5  (Blank rows = not tested)  LANDMARK LEFT   eval  Axilla 32  10 cm proximal to olecranon process 30.5  Olecranon process 27.3  10 cm proximal to ulnar styloid process 23.1  Just proximal to ulnar styloid process 19  Across hand at thumb web space 20  At base of 2nd digit 6.8  (Blank rows = not tested)  L-DEX LYMPHEDEMA SCREENING: L-DEX was out of range at -23.1 showing that the opposite side may be larger due to undiagnosed lymphedema. Pt and granddaughter educated about this and decided we would order a sleeve and gauntlet.   QUICK DASH SURVEY: 11.36%  PATIENT EDUCATION:  Education  details: Lymphedema risk reduction and post op shoulder/posture HEP, sleeve use for Lt UE Person educated: Patient Education method: Explanation, Demonstration, Handout Education comprehension: Patient verbalized understanding and returned demonstration  HOME EXERCISE PROGRAM: Patient was instructed today in a home exercise program today for post op shoulder range of motion. These included active assist shoulder flexion in sitting, scapular retraction, wall walking with shoulder abduction, and hands behind head external rotation.  She was encouraged to do these twice a day, holding 3 seconds and repeating 5 times when permitted by her physician.   ASSESSMENT:  CLINICAL IMPRESSION: Pt is planning to have either a lumpectomy or mastectomy pending on if she needs radiation.  They would like to avoid radiation and will go in that direction.  Pts arm was noted to be around 2-3cm larger on the left side where she had had prior breast cancer treatment in 1997 and then fell a few years ago noting that her arm had been larger ever since. PT determined with measurements, SOZO, and appearance that pt seems to be having lymphedema already on the that side.  This presents like stage 3 as it is no longer pitting and does not fluctuate.  Pt does not seem bothered by the size difference in her arm and reports it has not changed in awhile so we will submit order to [facility] for a compression sleeve for the left side 20-82mmHg sigvaris secure sleeve and gauntlet and we will do visits as needed for lymphedema education on the other side.  She will benefit from a post op PT reassessment to determine needs.  No screening needed.  Fits in sigvaris secure L2; large average long and medium gauntlet  Addendum 06/06/22: Submitted order through [facility] 07/27/22: order submitted through [facility] custom due to no [facility] benefits   Pt will benefit from skilled therapeutic intervention to improve on the following deficits: Decreased  knowledge of precautions, impaired UE functional use, pain, decreased ROM, postural dysfunction.   PT treatment/interventions: ADL/self-care home management, pt/family education, therapeutic exercise  REHAB POTENTIAL: Good  CLINICAL DECISION MAKING: Stable/uncomplicated  EVALUATION COMPLEXITY: Low   GOALS: Goals reviewed with patient? YES  LONG TERM GOALS: (STG=LTG)    Name Target Date Goal status  1 Pt will be able to verbalize understanding of pertinent lymphedema risk reduction practices relevant to her dx specifically related to skin care.  Baseline:  No knowledge 05/26/2022 Achieved at eval  2 Pt will be able to return demo and/or verbalize understanding of the post op HEP related to regaining shoulder ROM. Baseline:  No knowledge 05/26/2022 Achieved at eval  3 Pt will be able to verbalize understanding of the importance of attending the post op After Breast CA Class for further lymphedema risk reduction education and therapeutic exercise.  Baseline:  No knowledge 05/26/2022 Achieved at eval  4 Pt will demo she has regained full shoulder ROM and function post operatively compared to baselines.  Baseline: See objective measurements taken today. 07/07/22     PLAN:  PT FREQUENCY/DURATION: EVAL and 1 follow up appointment.   PLAN FOR NEXT SESSION: will reassess 3-4 weeks post op to determine needs.   Patient will follow up at outpatient cancer rehab 3-4 weeks following surgery.  If the patient requires physical therapy at that time, a specific plan will be dictated and sent to the referring physician for approval. The patient was educated today on appropriate basic range of motion exercises to begin post operatively and the importance of attending the After Breast Cancer class following surgery.  Patient was educated today on lymphedema risk reduction practices as it pertains to recommendations that will benefit the patient immediately following surgery.  She verbalized good  understanding.    Physical Therapy Information for After Breast Cancer Surgery/Treatment:  Lymphedema is a swelling condition that you may be at risk for in your arm if you have lymph nodes removed from the armpit area.  After a sentinel node biopsy, the risk is approximately 5-9% and is higher after an axillary node dissection.  There is treatment available for this condition and it is not life-threatening.  Contact your physician or physical therapist with concerns. You may begin the 4 shoulder/posture  exercises (see additional sheet) when permitted by your physician (typically a week after surgery).  If you have drains, you may need to wait until those are removed before beginning range of motion exercises.  A general recommendation is to not lift your arms above shoulder height until drains are removed.  These exercises should be done to your tolerance and gently.  This is not a "no pain/no gain" type of recovery so listen to your body and stretch into the range of motion that you can tolerate, stopping if you have pain.  If you are having immediate reconstruction, ask your plastic surgeon about doing exercises as he or she may want you to wait. We encourage you to attend the free one time ABC (After Breast Cancer) class offered by [facility].  You will learn information related to lymphedema risk, prevention and treatment and additional exercises to regain mobility following surgery.  You can call [phone] for more information.  This is offered the 1st and 3rd Monday of each month.  You only attend the class one time. While undergoing any medical procedure or treatment, try to avoid blood pressure being taken or needle sticks from occurring on the arm on the side of cancer.   This recommendation begins after surgery and continues for the rest of your life.  This may help reduce your risk of getting lymphedema (swelling in your arm). An excellent resource for those seeking  information on lymphedema is the National Lymphedema Network's web site. It can be accessed at www.lymphnet.org If you notice swelling in your hand, arm or breast at any time following surgery (even if it is many years from now), please contact your doctor or physical therapist to discuss this.  Lymphedema can be treated at any time but it is easier for you if it is treated early on.  If you feel like your shoulder motion is not returning to normal in a reasonable amount of time, please contact your surgeon or physical therapist.  [facility] [phone]. [address], [place] [address]  ABC CLASS After Breast Cancer Class  After Breast Cancer Class is a specially designed exercise class to assist you in a safe recover after having breast cancer surgery.  In this class you will learn how to get back to full function whether your drains were just removed or if you had surgery a month ago.  This one-time class is held the 1st and 3rd Monday of every month from 11:00 a.m. until 12:00 noon virtually.  This class is FREE and space is limited. For more information or to register for the next available class, call [phone].  Class Goals  Understand specific stretches to improve the flexibility of you chest and shoulder. Learn ways to safely strengthen your upper body and improve your posture. Understand the warning signs of infection and why you may be at risk for an arm infection. Learn about Lymphedema and prevention.  ** You do not attend this class until after surgery.  Drains must be removed to participate  Patient was instructed today in a home exercise program today for post op shoulder range of motion. These included active assist shoulder flexion in sitting, scapular retraction, wall walking with shoulder abduction, and hands behind head external rotation.  She was encouraged to do these twice a day, holding 3 seconds and repeating 5  times when permitted by her physician.    Idamae Lusher, PT 05/26/2022,  12:55 PM

## 2022-05-26 ENCOUNTER — Other Ambulatory Visit: Payer: Self-pay | Admitting: Radiation Therapy

## 2022-05-26 ENCOUNTER — Ambulatory Visit: Payer: 59 | Attending: General Surgery | Admitting: Rehabilitation

## 2022-05-26 ENCOUNTER — Encounter: Payer: Self-pay | Admitting: Rehabilitation

## 2022-05-26 DIAGNOSIS — I972 Postmastectomy lymphedema syndrome: Secondary | ICD-10-CM | POA: Insufficient documentation

## 2022-05-26 DIAGNOSIS — Z853 Personal history of malignant neoplasm of breast: Secondary | ICD-10-CM | POA: Diagnosis not present

## 2022-05-26 DIAGNOSIS — C3492 Malignant neoplasm of unspecified part of left bronchus or lung: Secondary | ICD-10-CM | POA: Insufficient documentation

## 2022-05-26 DIAGNOSIS — C7931 Secondary malignant neoplasm of brain: Secondary | ICD-10-CM | POA: Insufficient documentation

## 2022-05-27 ENCOUNTER — Telehealth: Payer: Self-pay | Admitting: *Deleted

## 2022-05-27 NOTE — Telephone Encounter (Signed)
   Pre-operative Risk Assessment    Patient Name: Jennifer Cowan  DOB: 12/17/1945 MRN: [id number]      Request for Surgical Clearance    Procedure:   RIGHT MASTECTOMY  Date of Surgery:  Clearance TBD                                 Surgeon:  Almond Lint, MD Surgeon's Group or Practice Name:  [facility] Phone number:  [phone] Fax number:  [phone]   Type of Clearance Requested:   - Medical  - Pharmacy:  Hold Aspirin NOT INDICATED HOW LONG   Type of Anesthesia:  General    Additional requests/questions:    [name]   05/27/2022, 3:47 PM

## 2022-05-30 ENCOUNTER — Other Ambulatory Visit: Payer: 59

## 2022-05-30 ENCOUNTER — Other Ambulatory Visit: Payer: Self-pay | Admitting: General Surgery

## 2022-05-30 DIAGNOSIS — Z171 Estrogen receptor negative status [ER-]: Secondary | ICD-10-CM

## 2022-05-30 DIAGNOSIS — C3490 Malignant neoplasm of unspecified part of unspecified bronchus or lung: Secondary | ICD-10-CM

## 2022-05-30 NOTE — Telephone Encounter (Signed)
   Patient Name: Jennifer Cowan  DOB: [birth year] MRN: [id number]  Primary Cardiologist: None  Chart reviewed as part of pre-operative protocol coverage. Pre-op clearance already addressed by colleagues in earlier phone notes. To summarize recommendations:  -Okay to clear for upcoming surgery per Dr. Gala Romney. ASA is not prescribed by [facility] and holding parameters will need to come from prescribing physician.   Will route this bundled recommendation to requesting provider via Epic fax function and remove from pre-op pool. Please call with questions.  Sharlene Dory, PA-C 05/30/2022, 8:14 AM

## 2022-06-01 DIAGNOSIS — R5381 Other malaise: Secondary | ICD-10-CM | POA: Diagnosis not present

## 2022-06-01 DIAGNOSIS — G9389 Other specified disorders of brain: Secondary | ICD-10-CM | POA: Diagnosis not present

## 2022-06-01 DIAGNOSIS — C50919 Malignant neoplasm of unspecified site of unspecified female breast: Secondary | ICD-10-CM | POA: Diagnosis not present

## 2022-06-01 DIAGNOSIS — R918 Other nonspecific abnormal finding of lung field: Secondary | ICD-10-CM | POA: Diagnosis not present

## 2022-06-11 ENCOUNTER — Other Ambulatory Visit: Payer: Self-pay

## 2022-06-11 ENCOUNTER — Emergency Department (HOSPITAL_COMMUNITY): Payer: 59

## 2022-06-11 ENCOUNTER — Emergency Department (HOSPITAL_COMMUNITY)
Admission: EM | Admit: 2022-06-11 | Discharge: 2022-06-11 | Disposition: A | Discharge status: Home / Self Care | Payer: 59 | Attending: Emergency Medicine | Admitting: Emergency Medicine

## 2022-06-11 ENCOUNTER — Encounter (HOSPITAL_COMMUNITY): Payer: Self-pay | Admitting: *Deleted

## 2022-06-11 DIAGNOSIS — Z7982 Long term (current) use of aspirin: Secondary | ICD-10-CM | POA: Diagnosis not present

## 2022-06-11 DIAGNOSIS — Z853 Personal history of malignant neoplasm of breast: Secondary | ICD-10-CM | POA: Insufficient documentation

## 2022-06-11 DIAGNOSIS — R258 Other abnormal involuntary movements: Secondary | ICD-10-CM | POA: Diagnosis not present

## 2022-06-11 DIAGNOSIS — E119 Type 2 diabetes mellitus without complications: Secondary | ICD-10-CM | POA: Insufficient documentation

## 2022-06-11 DIAGNOSIS — Z79899 Other long term (current) drug therapy: Secondary | ICD-10-CM | POA: Diagnosis not present

## 2022-06-11 DIAGNOSIS — R569 Unspecified convulsions: Secondary | ICD-10-CM

## 2022-06-11 DIAGNOSIS — G936 Cerebral edema: Secondary | ICD-10-CM | POA: Insufficient documentation

## 2022-06-11 DIAGNOSIS — I10 Essential (primary) hypertension: Secondary | ICD-10-CM | POA: Diagnosis not present

## 2022-06-11 LAB — CBC WITH DIFFERENTIAL/PLATELET
Abs Immature Granulocytes: 0.01 10*3/uL (ref 0.00–0.07)
Basophils Absolute: 0 10*3/uL (ref 0.0–0.1)
Basophils Relative: 0 %
Eosinophils Absolute: 0.1 10*3/uL (ref 0.0–0.5)
Eosinophils Relative: 3 %
HCT: 40.1 % (ref 36.0–46.0)
Hemoglobin: 13.1 g/dL (ref 12.0–15.0)
Immature Granulocytes: 0 %
Lymphocytes Relative: 21 %
Lymphs Abs: 1 10*3/uL (ref 0.7–4.0)
MCH: 31.8 pg (ref 26.0–34.0)
MCHC: 32.7 g/dL (ref 30.0–36.0)
MCV: 97.3 fL (ref 80.0–100.0)
Monocytes Absolute: 1 10*3/uL (ref 0.1–1.0)
Monocytes Relative: 20 %
Neutro Abs: 2.8 10*3/uL (ref 1.7–7.7)
Neutrophils Relative %: 56 %
Platelets: 354 10*3/uL (ref 150–400)
RBC: 4.12 MIL/uL (ref 3.87–5.11)
RDW: 16.3 % — ABNORMAL HIGH (ref 11.5–15.5)
WBC: 4.9 10*3/uL (ref 4.0–10.5)
nRBC: 0 % (ref 0.0–0.2)

## 2022-06-11 LAB — URINALYSIS, ROUTINE W REFLEX MICROSCOPIC
Bilirubin Urine: NEGATIVE
Glucose, UA: NEGATIVE mg/dL
Ketones, ur: NEGATIVE mg/dL
Nitrite: NEGATIVE
Protein, ur: NEGATIVE mg/dL
Specific Gravity, Urine: 1.008 (ref 1.005–1.030)
pH: 5 (ref 5.0–8.0)

## 2022-06-11 LAB — RAPID URINE DRUG SCREEN, HOSP PERFORMED
Amphetamines: NOT DETECTED
Barbiturates: NOT DETECTED
Benzodiazepines: NOT DETECTED
Cocaine: NOT DETECTED
Opiates: NOT DETECTED
Tetrahydrocannabinol: NOT DETECTED

## 2022-06-11 LAB — COMPREHENSIVE METABOLIC PANEL
ALT: 17 U/L (ref 0–44)
AST: 15 U/L (ref 15–41)
Albumin: 3.4 g/dL — ABNORMAL LOW (ref 3.5–5.0)
Alkaline Phosphatase: 75 U/L (ref 38–126)
Anion gap: 8 (ref 5–15)
BUN: 17 mg/dL (ref 8–23)
CO2: 24 mmol/L (ref 22–32)
Calcium: 9.1 mg/dL (ref 8.9–10.3)
Chloride: 106 mmol/L (ref 98–111)
Creatinine, Ser: 0.65 mg/dL (ref 0.44–1.00)
GFR, Estimated: >60 mL/min (ref >60)
Glucose, Bld: 117 mg/dL — ABNORMAL HIGH (ref 70–99)
Potassium: 4.2 mmol/L (ref 3.5–5.1)
Sodium: 138 mmol/L (ref 135–145)
Total Bilirubin: 0.7 mg/dL (ref 0.3–1.2)
Total Protein: 6.5 g/dL (ref 6.5–8.1)

## 2022-06-11 LAB — CBG MONITORING, ED: Glucose-Capillary: 119 mg/dL — ABNORMAL HIGH (ref 70–99)

## 2022-06-11 LAB — MAGNESIUM: Magnesium: 1.8 mg/dL (ref 1.7–2.4)

## 2022-06-11 MED ORDER — DEXAMETHASONE 4 MG PO TABS: 4.0000 mg | ORAL_TABLET | Freq: Three times a day (TID) | ORAL | 0 refills | Status: DC

## 2022-06-11 MED ORDER — DEXAMETHASONE 4 MG PO TABS
4.0000 mg | ORAL_TABLET | Freq: Once | ORAL | Status: AC
  Administered 2022-06-11: 4 mg via ORAL
  Filled 2022-06-11: qty 1

## 2022-06-11 MED ORDER — SODIUM CHLORIDE 0.9 % IV SOLN: 20.0000 mg/kg | Freq: Once | INTRAVENOUS | Status: DC

## 2022-06-11 MED ORDER — LEVETIRACETAM 500 MG PO TABS: 500.0000 mg | ORAL_TABLET | Freq: Two times a day (BID) | ORAL | 0 refills | Status: AC

## 2022-06-11 MED ORDER — HEPARIN SOD (PORK) LOCK FLUSH 100 UNIT/ML IV SOLN
INTRAVENOUS | Status: AC
  Filled 2022-06-11: qty 5

## 2022-06-11 MED ORDER — LEVETIRACETAM IN NACL 1500 MG/100ML IV SOLN
1500.0000 mg | Freq: Once | INTRAVENOUS | Status: AC
  Administered 2022-06-11: 1500 mg via INTRAVENOUS
  Filled 2022-06-11: qty 100

## 2022-06-11 NOTE — ED Triage Notes (Addendum)
Pt has had brain cancer with radiation, family was told if she starts to have balance problems or changes to come to the hospital. Niece states pt has had a couple of days with balance problems and today shaking with a distance stare. Reports back pain that has had pain since shingles.

## 2022-06-11 NOTE — Discharge Instructions (Addendum)
Thank you for coming to [facility] Emergency Department. You were seen for seizure-like activity. We did an exam, labs, and imaging, and these showed worsening edema (swelling) in the brain. I spoke with Dr. Lovell Sheehan on call neurosurgeon who recommended anti-seizure medicine called Keppra (levetiracetam) 500 mg twice per day and steroids Decadron 4mg  three times per day. Please call Dr. Liana Gerold office to make close follow up for next week to discuss next steps.  Do not hesitate to return to the ED or call 911 if you experience: -Worsening symptoms -Confusion, lethargy -Further seizure-like activity -Loss of consciousness -Asymmetric weakness, numbness tingling, facial droop, slurred speech -Lightheadedness, passing out -Fevers/chills -Anything else that concerns you  Please do not drive, operate heavy machinery, or get into bodies of water including the bathtub, hot tubs, lakes, rivers until you follow with your neurologist.

## 2022-06-11 NOTE — ED Provider Notes (Signed)
[place] EMERGENCY DEPARTMENT AT [facility] Provider Note   CSN: [id number] Arrival date & time: 06/11/22  1704     History {Add pertinent medical, surgical, social history, OB history to HPI:1} Chief Complaint  Patient presents with   Shaking    Jennifer Cowan is a 77 y.o. 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   Pt has had lung cancer metastatic to brain treated with radiation.  Also has breast cancer status post left mastectomy and now right breast cancer treated with chemotherapy, last treatment was 4 weeks ago, plan for right mastectomy in a couple of weeks and they are holding off on chemo at this time.  Niece is at bedside provides additional history.  Niece states that over the last couple of days it seems that patient has had some intermittent trouble with balance where she would have fallen if not for help by the family.  She has not had any falls or head trauma.  Patient has not complained of any balance issues or any weakness in her legs but the family has noted some bilateral lower extremity weakness or balance trouble.  Patient's sister was helping her get lunch today and noted an episode that lasted approximately 1 minute where patient was staring into the distance and her whole body began shaking.  She was responsive during the episode wench asked if she was okay she said yes.  She did not fall or lose consciousness.  Did not have any loss of bladder or bowel, no tongue biting.  She tried to stand up to go to the bathroom with help from her sister but was experiencing all over trembling that lasted another couple of minutes.  Patient's and since then been in her normal state of health.  Patient states she does not remember the event that occurred earlier and does not remember the shaking or staring.  She only complains of back pain and an area of her back where she had  shingles last year.  Otherwise has had no other complaints.  No fevers chills, headache, visual changes, numbness tingling, asymmetric weakness, chest or abdominal pain, shortness of breath, leg swelling, urinary symptoms.   HPI     Home Medications Prior to Admission medications   Medication Sig Start Date End Date Taking? Authorizing Provider  Accu-Chek Softclix Lancets lancets Use to check blood sugar as directed 4 times a day 02/14/22   Pickenpack-Cousar, Arty Baumgartner, NP  acetaminophen (TYLENOL) 500 MG tablet Take 1,000 mg by mouth every 6 (six) hours as needed for moderate pain.    [provider]  amLODipine (NORVASC) 10 MG tablet TAKE 1 TABLET BY MOUTH DAILY 08/04/21   [name], FNP  aspirin EC 325 MG tablet Take 1 tablet (325 mg total) by mouth daily. 12/16/21   Henreitta Leber, MD  atorvastatin (LIPITOR) 80 MG tablet Take 1 tablet (80 mg total) by mouth daily. 10/08/21   [name], FNP  benazepril (LOTENSIN) 10 MG tablet TAKE 1 TABLET BY MOUTH DAILY 08/04/21   [name], FNP  blood glucose meter kit and supplies KIT Dispense based on patient and insurance preference. Use up to four times daily as directed. 08/31/21   Loa Socks, NP  Blood Glucose Monitoring Suppl (ACCU-CHEK GUIDE) w/Device KIT Use as directed 4 times a day 08/31/21   Loa Socks, NP  Cholecalciferol (VITAMIN  D3) 125 MCG (5000 UT) CAPS Take 5,000 Units by mouth daily.    [provider]  gabapentin (NEURONTIN) 100 MG capsule Take 3 capsules (300 mg total) by mouth 2 (two) times daily. 05/19/22   Pickenpack-Cousar, Arty Baumgartner, NP  glucose blood (ACCU-CHEK GUIDE) test strip Use as directed 4 times a day 01/26/22   Pickenpack-Cousar, Arty Baumgartner, NP  ketoconazole (NIZORAL) 2 % cream Apply 1 Application topically 2 (two) times daily. 03/15/22   [provider]  LORazepam (ATIVAN) 0.5 MG tablet 1 tab po 30 minutes prior to radiation or MRI scans 08/04/21    Ronny Bacon, PA-C  meloxicam (MOBIC) 7.5 MG tablet TAKE 1 TABLET BY MOUTH DAILY 08/04/21   [name], FNP  spironolactone (ALDACTONE) 25 MG tablet Take 0.5 tablets (12.5 mg total) by mouth daily. 03/21/22   Bensimhon, Bevelyn Buckles, MD  trolamine salicylate (ASPERCREME) 10 % cream Apply 1 Application topically daily as needed for muscle pain.    [provider]      Allergies    Patient has no known allergies.    Review of Systems   Review of Systems Review of systems Negative for f/c.  A 10 point review of systems was performed and is negative unless otherwise reported in HPI.  Physical Exam Updated Vital Signs BP 121/67   Pulse 85   Temp 98.4 F (36.9 C) (Oral)   Resp 18   Ht 5\' 8"  (1.727 m)   Wt 77.6 kg   SpO2 100%   BMI 26.00 kg/m  Physical Exam General: Elderly-appearing female, lying in bed.  HEENT: PERRLA, EOMI, no nystagmus, Sclera anicteric, MMM, trachea midline. Tongue protrudes midline, no tongue fasciculations. Cardiology: RRR, no murmurs/rubs/gallops. BL radial and DP pulses equal bilaterally.  Resp: Normal respiratory rate and effort. CTAB, no wheezes, rhonchi, crackles.  Abd: Soft, non-tender, non-distended. No rebound tenderness or guarding.  GU: Deferred. MSK: No peripheral edema or signs of trauma. Extremities without deformity or TTP. No cyanosis or clubbing. Skin: warm, dry. No rashes or lesions. Back: No CVA tenderness Neuro: A&Ox4, CNs II-XII grossly intact. 5/5 strength all extremities. Sensation grossly intact. Normal FTN bilaterally. Normal speech. No tremors/trembling noted. Psych: Normal mood and affect.   ED Results / Procedures / Treatments   Labs (all labs ordered are listed, but only abnormal results are displayed) Labs Reviewed  CBC WITH DIFFERENTIAL/PLATELET - Abnormal; Notable for the following components:      Result Value   RDW 16.3 (*)    All other components within normal limits  COMPREHENSIVE METABOLIC PANEL -  Abnormal; Notable for the following components:   Glucose, Bld 117 (*)    Albumin 3.4 (*)    All other components within normal limits  URINALYSIS, ROUTINE W REFLEX MICROSCOPIC - Abnormal; Notable for the following components:   Color, Urine STRAW (*)    Hgb urine dipstick SMALL (*)    Leukocytes,Ua SMALL (*)    Bacteria, UA RARE (*)    All other components within normal limits  CBG MONITORING, ED - Abnormal; Notable for the following components:   Glucose-Capillary 119 (*)    All other components within normal limits  MAGNESIUM  RAPID URINE DRUG SCREEN, HOSP PERFORMED    EKG None  Radiology CT Head Wo Contrast  Result Date: 06/11/2022 CLINICAL DATA:  Seizure, new-onset, no history of trauma Mental status change, unknown cause EXAM: CT HEAD WITHOUT CONTRAST TECHNIQUE: Contiguous axial images were obtained from the base of the skull  through the vertex without intravenous contrast. RADIATION DOSE REDUCTION: This exam was performed according to the departmental dose-optimization program which includes automated exposure control, adjustment of the mA and/or kV according to patient size and/or use of iterative reconstruction technique. COMPARISON:  MRI 05/12/2022 FINDINGS: Brain: Right temporal mass difficult to visualize, better seen on MR imaging. Extensive surrounding edema. Edema appears to have increased since prior study with 4 mm of right to left midline shift. No hydrocephalus. No areas of hemorrhage. Vascular: No hyperdense vessel or unexpected calcification. Skull: No acute calvarial abnormality. Sinuses/Orbits: No acute findings Other: None IMPRESSION: Right temporal mass seen on prior MRI not as well visualized on this noncontrast CT. Increasing surrounding edema with 4 mm of right to left midline shift. Electronically Signed   By: Charlett Nose M.D.   On: 06/11/2022 18:19    Procedures Procedures  {Document cardiac monitor, telemetry assessment procedure when  appropriate:1}  Medications Ordered in ED Medications  dexamethasone (DECADRON) tablet 4 mg (has no administration in time range)  levETIRAcetam (KEPPRA) 1,550 mg in sodium chloride 0.9 % 100 mL IVPB (has no administration in time range)    ED Course/ Medical Decision Making/ A&P                          Medical Decision Making Amount and/or Complexity of Data Reviewed Labs: ordered. Decision-making details documented in ED Course. Radiology: ordered. Decision-making details documented in ED Course.  Risk Prescription drug management.    This patient presents to the ED for concern of staring/jerking, this involves an extensive number of treatment options, and is a complaint that carries with it a high risk of complications and morbidity.  I considered the following differential and admission for this acute, potentially life threatening condition.   MDM:    Patient with an episode of decreased responsiveness, staring, and all of her shaking/"jerking" that occurred earlier today lasted approximately 1 minute.  Patient has a history of lung cancer metastatic to the brain, this does raise concern for increased brain metastasis/tumor burden or cerebral edema.  She did not have any generalized clonic activity but could have had a complex partial seizure.  Also consider rigoring as a cause of possible tremoring however patient has not any fevers or chills and complains of no infectious symptoms.  She does on chemotherapy the last treatment was approximately 4 weeks ago, she has had no continued symptoms of the trembling since that time earlier today and otherwise feels like her normal self.  Consider hypo-/hyperglycemia, electrolyte derangements, arrhythmia or ACS as a cause of an episode of decreased responsiveness or abnormal consciousness, the patient did not experience any presyncope or syncope either, but she states she does not really remember the event.  Will obtain basic labs as well as a  CT head to rule out ICH but patient possibly require brain MRI and consult with neurology or neurosurgery.   Clinical Course as of 06/11/22 2049  Sat Jun 11, 2022  1802 Glucose-Capillary(!): 119 [HN]  1848 CT Head Wo Contrast Right temporal mass seen on prior MRI not as well visualized on this noncontrast CT. Increasing surrounding edema with 4 mm of right to left midline shift.   [HN]  1849 Magnesium: 1.8 [HN]  1849 Comprehensive metabolic panel(!) unremarakble [HN]  1849 CBC with Differential(!) unremarkable [HN]  1849 Will consult to neurosurgery [HN]  1946 Urinalysis, Routine w reflex microscopic -Urine, Clean Catch(!) Neg for UTI [HN]  2005  Paged NSGY x2 [HN]  2041 D/w Dr. Lovell Sheehan w/ NSGY. Recommends decadron 4 mg TID, keppra 500 mg BID and f/u with Dr. Barbaraann Cao w/ neuro-oncology about this. Patient is currently at her baseline and has had no other observed seizure-like activity here in the ED. No need for acute admission given chronic brain mass and good o/p follow-up. Will load patient with 20 mg/kg IV keppra now and give 4mg  decadron and dc w/ rx's and Dr. Barbaraann Cao f/u.  [HN]    Clinical Course User Index [HN] Loetta Rough, MD    Labs: I Ordered, and personally interpreted labs.  The pertinent results include: Those listed above  Imaging Studies ordered: I ordered imaging studies including CT head I independently visualized and interpreted imaging. I agree with the radiologist interpretation  Additional history obtained from chart review, niece, sister via the phone.   Cardiac Monitoring: The patient was maintained on a cardiac monitor.  I personally viewed and interpreted the cardiac monitored which showed an underlying rhythm of: Normal sinus rhythm  Reevaluation: After the interventions noted above, I reevaluated the patient and found that they have :stayed the same  Social Determinants of Health:  patient lives with her family  Disposition:  DC w/ discharge  instructions/return precautions. All questions answered to patient's satisfaction.    Co morbidities that complicate the patient evaluation  Past Medical History:  Diagnosis Date   Anemia    Arthritis    Breast cancer (HCC)    Cancer (HCC) 1999   Breast Cancer   Family history of breast cancer    Hyperlipidemia    Hypertension    Osteopenia 01/12/2021   Personal history of malignant neoplasm of breast      Medicines Meds ordered this encounter  Medications   dexamethasone (DECADRON) tablet 4 mg   levETIRAcetam (KEPPRA) 1,550 mg in sodium chloride 0.9 % 100 mL IVPB    I have reviewed the patients home medicines and have made adjustments as needed  Problem List / ED Course: Problem List Items Addressed This Visit   None Visit Diagnoses     Seizure-like activity (HCC)    -  Primary   Cerebral edema (HCC)                {Document critical care time when appropriate:1} {Document review of labs and clinical decision tools ie heart score, Chads2Vasc2 etc:1}  {Document your independent review of radiology images, and any outside records:1} {Document your discussion with family members, caretakers, and with consultants:1} {Document social determinants of health affecting pt's care:1} {Document your decision making why or why not admission, treatments were needed:1}  This note was created using dictation software, which may contain spelling or grammatical errors.

## 2022-06-11 NOTE — ED Notes (Addendum)
Port accessed using sterile technique. Pt tolerated procedure well. Blood drawn without complications. Flushed port with 10ml NS. Saline locked. Time and dated port access.

## 2022-06-14 ENCOUNTER — Telehealth: Payer: Self-pay | Admitting: *Deleted

## 2022-06-14 ENCOUNTER — Other Ambulatory Visit: Payer: Self-pay | Admitting: Internal Medicine

## 2022-06-14 ENCOUNTER — Telehealth: Payer: Self-pay | Admitting: Internal Medicine

## 2022-06-14 NOTE — Telephone Encounter (Signed)
Niece called to see if Manica can be seen earlier than 6/11. Is scheduled to have a mastectomy on 6/10.  Went to ED this weekend due to "seizure-like" activity. Scan showed swelling at the radiation area. Was started on Decadron and Keppra.

## 2022-06-14 NOTE — Telephone Encounter (Signed)
LM stating Dr Barbaraann Cao can see Jennifer Cowan on 6/6 @ 3:00 after MRI. Requested return call to confirm.

## 2022-06-14 NOTE — Telephone Encounter (Signed)
Spoke with patient niece confirming upcoming appointment

## 2022-06-15 ENCOUNTER — Telehealth: Payer: Self-pay | Admitting: *Deleted

## 2022-06-15 NOTE — Telephone Encounter (Signed)
Transition Care Management Follow-up Telephone Call Date of discharge and from where: [facility] long ed 06/11/2022 How have you been since you were released from the hospital? Decline in general will see Dr tomorrow  Any questions or concerns? No  Items Reviewed: Did the pt receive and understand the discharge instructions provided? Yes  Medications obtained and verified? Yes  Other? No  Any new allergies since your discharge? No  Dietary orders reviewed? No Do you have support at home? Yes    T  Follow up appointments reviewed:  [facility] f/u appt confirmed? Yes   Follow up appt with oncology 06/23/2022 06/16/2022 with dr Dutch Quint  Are transportation arrangements needed? NO  If their condition worsens, is the pt aware to call PCP or go to the Emergency Dept.? Yes Was the patient provided with contact information for the PCP's office or ED? Yes Was to pt encouraged to call back with questions or concerns? Yes   Alois Cliche -[facility], Population Health [phone] [address] [place] , [place] [address] Email : [email or web address]

## 2022-06-16 ENCOUNTER — Encounter: Payer: Self-pay | Admitting: Hematology and Oncology

## 2022-06-16 DIAGNOSIS — C7931 Secondary malignant neoplasm of brain: Secondary | ICD-10-CM | POA: Diagnosis not present

## 2022-06-17 ENCOUNTER — Encounter: Payer: Self-pay | Admitting: Hematology and Oncology

## 2022-06-20 ENCOUNTER — Other Ambulatory Visit: Payer: Self-pay

## 2022-06-20 ENCOUNTER — Encounter (HOSPITAL_BASED_OUTPATIENT_CLINIC_OR_DEPARTMENT_OTHER): Payer: Self-pay | Admitting: General Surgery

## 2022-06-20 ENCOUNTER — Inpatient Hospital Stay: Payer: 59 | Attending: Hematology and Oncology | Admitting: Hematology and Oncology

## 2022-06-20 ENCOUNTER — Ambulatory Visit: Payer: 59 | Admitting: Internal Medicine

## 2022-06-20 ENCOUNTER — Encounter: Payer: Self-pay | Admitting: Nurse Practitioner

## 2022-06-20 ENCOUNTER — Inpatient Hospital Stay (HOSPITAL_BASED_OUTPATIENT_CLINIC_OR_DEPARTMENT_OTHER): Payer: 59 | Admitting: Nurse Practitioner

## 2022-06-20 VITALS — BP 140/62 | HR 99 | Temp 97.7°F | Resp 18 | Ht 68.0 in | Wt 169.6 lb

## 2022-06-20 VITALS — BP 140/62 | HR 99 | Temp 97.7°F | Resp 18 | Wt 169.0 lb

## 2022-06-20 DIAGNOSIS — R530 Neoplastic (malignant) related fatigue: Secondary | ICD-10-CM

## 2022-06-20 DIAGNOSIS — C3492 Malignant neoplasm of unspecified part of left bronchus or lung: Secondary | ICD-10-CM | POA: Diagnosis not present

## 2022-06-20 DIAGNOSIS — Z9221 Personal history of antineoplastic chemotherapy: Secondary | ICD-10-CM | POA: Insufficient documentation

## 2022-06-20 DIAGNOSIS — Z7952 Long term (current) use of systemic steroids: Secondary | ICD-10-CM | POA: Insufficient documentation

## 2022-06-20 DIAGNOSIS — C3490 Malignant neoplasm of unspecified part of unspecified bronchus or lung: Secondary | ICD-10-CM | POA: Diagnosis not present

## 2022-06-20 DIAGNOSIS — Z515 Encounter for palliative care: Secondary | ICD-10-CM

## 2022-06-20 DIAGNOSIS — C50211 Malignant neoplasm of upper-inner quadrant of right female breast: Secondary | ICD-10-CM | POA: Insufficient documentation

## 2022-06-20 DIAGNOSIS — M792 Neuralgia and neuritis, unspecified: Secondary | ICD-10-CM

## 2022-06-20 DIAGNOSIS — G893 Neoplasm related pain (acute) (chronic): Secondary | ICD-10-CM

## 2022-06-20 DIAGNOSIS — C3412 Malignant neoplasm of upper lobe, left bronchus or lung: Secondary | ICD-10-CM | POA: Insufficient documentation

## 2022-06-20 DIAGNOSIS — Z923 Personal history of irradiation: Secondary | ICD-10-CM | POA: Insufficient documentation

## 2022-06-20 DIAGNOSIS — C7931 Secondary malignant neoplasm of brain: Secondary | ICD-10-CM

## 2022-06-20 DIAGNOSIS — Z79899 Other long term (current) drug therapy: Secondary | ICD-10-CM | POA: Insufficient documentation

## 2022-06-20 MED ORDER — GABAPENTIN 100 MG PO CAPS
300.0000 mg | ORAL_CAPSULE | Freq: Two times a day (BID) | ORAL | 2 refills | Status: DC
Start: 2022-06-20 — End: 2022-08-09

## 2022-06-20 NOTE — Progress Notes (Signed)
[facility] Follow up:    Jennifer Earing, FNP [address] [place] [address]   DIAGNOSIS: lung cancer and breast cancer   SUMMARY OF ONCOLOGIC HISTORY: Oncology History  Infiltrating duct carcinoma (HCC)  07/25/2021 Initial Diagnosis   Infiltrating duct carcinoma (HCC)   02/27/2022 Genetic Testing   Negative genetic testing on the CancerNext-Expanded+RNAinsight panel.  The report date is February 27, 2022.  The CancerNext-Expanded gene panel offered by [facility] and includes sequencing and rearrangement analysis for the following 77 genes: AIP, ALK, APC*, ATM*, AXIN2, BAP1, BARD1, BLM, BMPR1A, BRCA1*, BRCA2*, BRIP1*, CDC73, CDH1*, CDK4, CDKN1B, CDKN2A, CHEK2*, CTNNA1, DICER1, FANCC, FH, FLCN, GALNT12, KIF1B, LZTR1, MAX, MEN1, MET, MLH1*, MSH2*, MSH3, MSH6*, MUTYH*, NBN, NF1*, NF2, NTHL1, PALB2*, PHOX2B, PMS2*, POT1, PRKAR1A, PTCH1, PTEN*, RAD51C*, RAD51D*, RB1, RECQL, RET, SDHA, SDHAF2, SDHB, SDHC, SDHD, SMAD4, SMARCA4, SMARCB1, SMARCE1, STK11, SUFU, TMEM127, TP53*, TSC1, TSC2, VHL and XRCC2 (sequencing and deletion/duplication); EGFR, EGLN1, HOXB13, KIT, MITF, PDGFRA, POLD1, and POLE (sequencing only); EPCAM and GREM1 (deletion/duplication only). DNA and RNA analyses performed for * genes.    06/15/2022 Cancer Staging   Staging form: Breast, AJCC 8th Edition - Clinical: Stage IIIB (cT3, cN0, cM0, G3, ER-, PR-, HER2-) - Signed by Rachel Moulds, MD on 06/15/2022 Histologic grading system: 3 grade system   Malignant neoplasm of unspecified part of unspecified bronchus or lung (HCC)  08/13/2021 Initial Diagnosis   Malignant neoplasm of unspecified part of unspecified bronchus or lung (HCC)   08/30/2021 - 09/13/2021 Chemotherapy   Patient is on Treatment Plan : LUNG Carboplatin / Paclitaxel + XRT q7d     08/30/2021 - 10/11/2021 Chemotherapy   Patient is on Treatment Plan : LUNG Carboplatin + Paclitaxel + XRT q7d     11/16/2021 - 02/17/2022 Chemotherapy   Patient  is on Treatment Plan : LUNG Pembrolizumab Q 21D     03/16/2022 - 03/16/2022 Chemotherapy   Patient is on Treatment Plan : BREAST Pembrolizumab (400) D1 + Carboplatin (1.5) weekly + Paclitaxel (80) weekly q42d X 2 cycles / Pembrolizumab (400) D1 + AC D1,22 q42d x 2 cycles     03/18/2022 -  Chemotherapy   Patient is on Treatment Plan : BREAST Pembrolizumab (200) D1 + Carboplatin (2) D1,8,15 + Paclitaxel (45) D1,8,15 q21d X 2 cycles     06/15/2022 Cancer Staging   Staging form: Lung, AJCC 8th Edition - Clinical: Stage IV (cTX, cM1) - Signed by Rachel Moulds, MD on 06/15/2022     CURRENT THERAPY: Pembrolizumab  INTERVAL HISTORY:  Jennifer Cowan 77 y.o. female returns for follow-up.  Interval History  Since her last visit here, she is scheduled for right mastectomy. Last chemotherapy was April 19 th 2024. She continues to complain of shingles pain in the left posterior chest wall. She had a seizure like episode a week ago. She is more emotional niece says. She is sleepy and tired from the keppra. She has another MRI on June 6 th, 6 week MRI ordered by Dr Barbaraann Cao. Rest of the pertinent 10 point ROS reviewed and neg  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   Type 2 diabetes mellitus with hyperglycemia, without long-term current use of insulin (HCC) 10/06/2021   Port-A-Cath in place 08/30/2021   Hyperglycemia 08/30/2021   Malignant neoplasm of unspecified part of unspecified bronchus or lung (HCC) 08/13/2021  Physical deconditioning 07/27/2021   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   Mass of upper lobe of left lung 07/25/2021   Metastasis to brain (HCC) 07/24/2021   Overweight 07/07/2021   Osteopenia 01/12/2021   History of breast cancer 03/05/2020   Hypertension associated with type 2 diabetes mellitus (HCC) 06/26/2018   Iron deficiency anemia 06/26/2018    Hyperlipidemia associated with type 2 diabetes mellitus (HCC) 06/26/2018   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  MEDICAL HISTORY: Past Medical History:  Diagnosis Date   Anemia    Arthritis    Breast cancer (HCC)    Cancer (HCC) 1999   Breast Cancer   Family history of breast cancer    Hyperlipidemia    Hypertension    Osteopenia 01/12/2021   Personal history of malignant neoplasm of breast     SURGICAL HISTORY: Past Surgical History:  Procedure Laterality Date   BREAST SURGERY  1999   Masectomy- left   BRONCHIAL BIOPSY  07/30/2021   Procedure: BRONCHIAL BIOPSIES;  Surgeon: Leslye Peer, MD;  Location: [facility];  Service: Pulmonary;;   BRONCHIAL BRUSHINGS  07/30/2021   Procedure: BRONCHIAL BRUSHINGS;  Surgeon: Leslye Peer, MD;  Location: [facility];  Service: Pulmonary;;   BRONCHIAL NEEDLE ASPIRATION BIOPSY  07/30/2021   Procedure: BRONCHIAL NEEDLE ASPIRATION BIOPSIES;  Surgeon: Leslye Peer, MD;  Location: [facility] ENDOSCOPY;  Service: Pulmonary;;   CYSTECTOMY     cyst removed off top of head   IR IMAGING GUIDED PORT INSERTION  08/20/2021   MASTECTOMY     VIDEO BRONCHOSCOPY WITH RADIAL ENDOBRONCHIAL ULTRASOUND  07/30/2021   Procedure: VIDEO BRONCHOSCOPY WITH RADIAL ENDOBRONCHIAL ULTRASOUND;  Surgeon: Leslye Peer, MD;  Location: [facility] ENDOSCOPY;  Service: Pulmonary;;    SOCIAL HISTORY: Social History   Socioeconomic History   Marital status: Single    Spouse name: Not on file   Number of children: 0   Years of education: 12   Highest education level: High school graduate  Occupational History   Occupation: retired  Tobacco Use   Smoking status: Never   Smokeless tobacco: Never  Vaping Use   Vaping Use: Never used  Substance and Sexual Activity   Alcohol use: No   Drug use: Never   Sexual activity: Yes    Birth control/protection: Post-menopausal  Other Topics Concern   Not on file  Social History  Narrative   Not on file   Social Determinants of Health   Financial Resource Strain: Not on file  Food Insecurity: Not on file  Transportation Needs: Not on file  Physical Activity: Not on file  Stress: Not on file  Social Connections: Not on file  Intimate Partner Violence: Not on file    FAMILY HISTORY: Family History  Problem Relation Age of Onset   Arthritis Mother    Breast cancer Sister 62   Diabetes Sister    Hyperlipidemia Sister    Hypertension Sister    Stroke Sister    Dementia Sister    Diabetes Brother    Hypertension Brother    Breast cancer Niece 63    Review of Systems  Constitutional:  Positive for fatigue. Negative for appetite change, chills, fever and unexpected weight change.  HENT:   Negative for hearing loss, lump/mass and trouble swallowing.   Eyes:  Negative for eye problems and icterus.  Respiratory:  Negative for chest tightness, cough and shortness of breath.   Cardiovascular:  Negative for  chest pain, leg swelling and palpitations.  Gastrointestinal:  Negative for abdominal distention, abdominal pain, constipation, diarrhea, nausea and vomiting.  Endocrine: Negative for hot flashes.  Genitourinary:  Negative for difficulty urinating.   Musculoskeletal:  Positive for back pain. Negative for arthralgias.  Skin:  Negative for itching and rash.  Neurological:  Negative for dizziness, extremity weakness, headaches and numbness.  Hematological:  Negative for adenopathy. Does not bruise/bleed easily.  Psychiatric/Behavioral:  Negative for depression. The patient is not nervous/anxious.       PHYSICAL EXAMINATION  ECOG PERFORMANCE STATUS: 1 - Symptomatic but completely ambulatory  Vitals:   06/20/22 1150  BP: (!) 140/62  Pulse: 99  Resp: 18  Temp: 97.7 F (36.5 C)  SpO2: 98%      Physical Exam Constitutional:      General: She is not in acute distress.    Appearance: Normal appearance. She is not toxic-appearing.  HENT:     Head:  Normocephalic and atraumatic.  Eyes:     General: No scleral icterus. Cardiovascular:     Rate and Rhythm: Normal rate and regular rhythm.     Pulses: Normal pulses.     Heart sounds: Normal heart sounds.  Pulmonary:     Effort: Pulmonary effort is normal.     Breath sounds: Normal breath sounds.  Chest:     Comments: Right breast mass appears smaller. No palpable right axillary adenopathy Abdominal:     General: Abdomen is flat. Bowel sounds are normal. There is no distension.     Palpations: Abdomen is soft.     Tenderness: There is no abdominal tenderness.  Musculoskeletal:        General: No swelling.     Cervical back: Neck supple.  Lymphadenopathy:     Cervical: No cervical adenopathy.  Skin:    General: Skin is warm and dry.     Findings: No rash.  Neurological:     General: No focal deficit present.     Mental Status: She is alert.  Psychiatric:        Mood and Affect: Mood normal.        Behavior: Behavior normal.     LABORATORY DATA:  CBC    Component Value Date/Time   WBC 4.9 06/11/2022 1804   RBC 4.12 06/11/2022 1804   HGB 13.1 06/11/2022 1804   HGB 12.9 05/06/2022 0845   HGB 15.7 07/07/2021 1538   HCT 40.1 06/11/2022 1804   HCT 46.6 07/07/2021 1538   PLT 354 06/11/2022 1804   PLT 319 05/06/2022 0845   PLT 450 07/07/2021 1538   MCV 97.3 06/11/2022 1804   MCV 88 07/07/2021 1538   MCH 31.8 06/11/2022 1804   MCHC 32.7 06/11/2022 1804   RDW 16.3 (H) 06/11/2022 1804   RDW 12.9 07/07/2021 1538   LYMPHSABS 1.0 06/11/2022 1804   LYMPHSABS 1.6 07/07/2021 1538   MONOABS 1.0 06/11/2022 1804   EOSABS 0.1 06/11/2022 1804   EOSABS 0.1 07/07/2021 1538   BASOSABS 0.0 06/11/2022 1804   BASOSABS 0.0 07/07/2021 1538    CMP     Component Value Date/Time   NA 138 06/11/2022 1804   NA 141 07/07/2021 1538   K 4.2 06/11/2022 1804   CL 106 06/11/2022 1804   CO2 24 06/11/2022 1804   GLUCOSE 117 (H) 06/11/2022 1804   BUN 17 06/11/2022 1804   BUN 9 07/07/2021  1538   CREATININE 0.65 06/11/2022 1804   CREATININE 0.56 05/06/2022 0845   CALCIUM  9.1 06/11/2022 1804   PROT 6.5 06/11/2022 1804   PROT 7.9 07/07/2021 1538   ALBUMIN 3.4 (L) 06/11/2022 1804   ALBUMIN 4.6 07/07/2021 1538   AST 15 06/11/2022 1804   AST 14 (L) 05/06/2022 0845   ALT 17 06/11/2022 1804   ALT 19 05/06/2022 0845   ALKPHOS 75 06/11/2022 1804   BILITOT 0.7 06/11/2022 1804   BILITOT 0.4 05/06/2022 0845   GFRNONAA >60 06/11/2022 1804   GFRNONAA >60 05/06/2022 0845   GFRAA 103 03/05/2020 1119    ASSESSMENT and THERAPY PLAN:   Malignant neoplasm of unspecified part of unspecified bronchus or lung (HCC) This is a 77 year old patient with 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  She also appears to have had some regional adenopathy including prevascular, AP window and left hilar adenopathy.  She has multiple intracranial metastatic lesions which are likely secondary to metastatic lung cancer given presence of regional adenopathy.   She is now status post SRS to the brain lesions. She started concurrent chemoradiation with CarboTaxol on August 30, 2021.  She completed chemoradiation on October 15, 2021.  End of treatment PET/CT reviewed consistent with response in the lung, likely postradiation changes noted. PET also commented on decrease in size of the breast mass.  We started noticing increasing size of the right breast mass while she was on immunotherapy alone. We then restarted her on weekly CarboTaxol with immunotherapy.  She responded really well to chemo, and had significant decrease in the right breast mass She is now scheduled for right mastectomy. She had a seizure about a week ago, is on keppra and steroids for radiation necrosis, has another MRI scheduled for June 6 th. Informed Dr Donell Beers via inbasket of these recent neurologic changes.  I will plan to see her back in approximately 3  weeks.  Rachel Moulds MD

## 2022-06-20 NOTE — Assessment & Plan Note (Signed)
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  She also appears to have had some regional adenopathy including prevascular, AP window and left hilar adenopathy.  She has multiple intracranial metastatic lesions which are likely secondary to metastatic lung cancer given presence of regional adenopathy.   She is now status post SRS to the brain lesions. She started concurrent chemoradiation with CarboTaxol on August 30, 2021.  She completed chemoradiation on October 15, 2021.  End of treatment PET/CT reviewed consistent with response in the lung, likely postradiation changes noted. PET also commented on decrease in size of the breast mass.  We started noticing increasing size of the right breast mass while she was on immunotherapy alone. We then restarted her on weekly CarboTaxol with immunotherapy.  She responded really well to chemo, and had significant decrease in the right breast mass She is now scheduled for right mastectomy. She had a seizure about a week ago, is on keppra and steroids for radiation necrosis, has another MRI scheduled for June 6 th. Informed Dr Donell Beers via inbasket of these recent neurologic changes.  I will plan to see her back in approximately 3 weeks.  Rachel Moulds MD

## 2022-06-20 NOTE — Progress Notes (Signed)
Chart reivewed with Dr. Chaney Malling. Patient has pending MRI with follow up on 06/23/2022 due to recent ER visit due to seizure like activity. Plan is for patient to have surgery here at [facility] if MRI is WNL, otherwise may have to move to main OR. Niece Phil Dopp is aware as well. Pre op phone call completed.

## 2022-06-20 NOTE — Progress Notes (Signed)
Palliative Medicine [facility]  [phone] [phone]   Name: Jennifer Cowan Date: 06/20/2022 MRN: [id number]  DOB: 1945-02-27  Patient Care Team: [name], FNP as PCP - General (Family Medicine) Pickenpack-Cousar, Arty Baumgartner, NP as Nurse Practitioner (Nurse Practitioner)   INTERVAL HISTORY: Jennifer Cowan is a 77 y.o. 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 Palliative ask to see for symptom management and goals of care.   SOCIAL HISTORY:     reports that she has never smoked. She has never used smokeless tobacco. She reports that she does not drink alcohol and does not use drugs.  ADVANCE DIRECTIVES:  Patient does not have an advanced directive however is interested in completing.   CODE STATUS:   PAST MEDICAL HISTORY: Past Medical History:  Diagnosis Date   Anemia    Arthritis    Breast cancer (HCC)    Cancer (HCC) 1999   Breast Cancer   Family history of breast cancer    Hyperlipidemia    Hypertension    Osteopenia 01/12/2021   Personal history of malignant neoplasm of breast     ALLERGIES:  has No Known Allergies.  MEDICATIONS:  Current Outpatient Medications  Medication Sig Dispense Refill   Accu-Chek Softclix Lancets lancets Use to check blood sugar as directed 4 times a day 100 each 1   acetaminophen (TYLENOL) 500 MG tablet Take 1,000 mg by mouth every 6 (six) hours as needed for moderate pain.     amLODipine (NORVASC) 10 MG tablet TAKE 1 TABLET BY MOUTH DAILY 90 tablet 3   atorvastatin (LIPITOR) 80 MG tablet Take 1 tablet (80 mg total) by mouth daily. 90 tablet 3   benazepril (LOTENSIN) 10 MG tablet TAKE 1 TABLET BY MOUTH DAILY 90 tablet 3   blood glucose meter kit and supplies KIT Dispense based on patient and insurance preference. Use up to four times  daily as directed. 1 each 0   Blood Glucose Monitoring Suppl (ACCU-CHEK GUIDE) w/Device KIT Use as directed 4 times a day 1 kit 0   Cholecalciferol (VITAMIN D3) 125 MCG (5000 UT) CAPS Take 5,000 Units by mouth daily.     dexamethasone (DECADRON) 4 MG tablet Take 1 tablet (4 mg total) by mouth 3 (three) times daily for 14 days. 42 tablet 0   EQ ASPIRIN 325 MG tablet Take 1 tablet by mouth once daily 60 tablet 0   gabapentin (NEURONTIN) 100 MG capsule Take 3 capsules (300 mg total) by mouth 2 (two) times daily. 60 capsule 2   glucose blood (ACCU-CHEK GUIDE) test strip Use as directed 4 times a day 100 each 1   ketoconazole (NIZORAL) 2 % cream Apply 1 Application topically 2 (two) times daily.     levETIRAcetam (KEPPRA) 500 MG tablet Take 1 tablet (500 mg total) by mouth 2 (two) times daily. 60 tablet 0   LORazepam (ATIVAN) 0.5 MG tablet 1 tab po 30 minutes prior to radiation or MRI scans 30 tablet 0   meloxicam (MOBIC) 7.5 MG tablet TAKE 1 TABLET BY MOUTH DAILY 90 tablet 3   spironolactone (ALDACTONE) 25 MG tablet Take 0.5 tablets (12.5 mg total) by mouth daily. 45 tablet 3   trolamine salicylate (ASPERCREME) 10 % cream Apply 1 Application topically daily as needed for muscle pain.     Current Facility-Administered Medications  Medication Dose Route  Frequency Provider Last Rate Last Admin   heparin lock flush 100 unit/mL  500 Units Intravenous Once Dorothy Puffer, MD       sodium chloride flush (NS) 0.9 % injection 10 mL  10 mL Intravenous Once Dorothy Puffer, MD       Facility-Administered Medications Ordered in Other Visits  Medication Dose Route Frequency Provider Last Rate Last Admin   acetaminophen (TYLENOL) 325 MG tablet            cyanocobalamin (VITAMIN B12) 1000 MCG/ML injection            diphenhydrAMINE (BENADRYL) 25 mg capsule            gadopiclenol (VUEWAY) 0.5 MMOL/ML solution 7.5 mL  7.5 mL Intravenous Once PRN Margaretmary Dys, MD        VITAL SIGNS: There were no vitals taken  for this visit. There were no vitals filed for this visit.   Estimated body mass index is 26 kg/m as calculated from the following:   Height as of 06/11/22: 5\' 8"  (1.727 m).   Weight as of 06/11/22: 171 lb (77.6 kg).   PERFORMANCE STATUS (ECOG) : 2 - Symptomatic, <50% confined to bed   Physical Exam General: NAD, sitting in recliner  Cardiovascular: regular rate and rhythm Pulmonary: normal breathing pattern  Extremities: no edema, no joint deformities Skin: no rashes Neurological: AAO x3, mood appropriate   IMPRESSION: Jennifer Cowan presents to clinic today with daughter for follow-up. No acute distress. Recent ED visit due to seizure like activity. Daughter reports no additional episodes. Taking things one day at a time. She is scheduled for MRI this week (6/6) and mastectomy surgery on 6/10. She is anxiously awaiting. Overall doing well.   Appetite is doing great. Patient shares she is going to [employer] for lunch. Denies nausea, vomiting, constipation, or diarrhea. No symptom management needs at this time. Tolerating gabapentin.   Patient and family knows to contact office as needed.   PLAN: Ongoing goals of care and support. Continues to improve.  Lidocaine patch as needed Gabapentin 900mg  three times daily Will plan to follow-up in 6-8 weeks in collaboration with other oncology appointments.    Patient expressed understanding and was in agreement with this plan. She also understands that She can call the clinic at any time with any questions, concerns, or complaints.     Any controlled substances utilized were prescribed in the context of palliative care. PDMP has been reviewed.    Visit consisted of counseling and education dealing with the complex and emotionally intense issues of symptom management and palliative care in the setting of serious and potentially life-threatening illness.Greater than 50%  of this time was spent counseling and coordinating care related to the above  assessment and plan.  Willette Alma, AGPCNP-BC  Palliative Medicine Team/ Cancer Center  *Please note that this is a verbal dictation therefore any spelling or grammatical errors are due to the "[employer] One" system interpretation.

## 2022-06-21 DIAGNOSIS — B351 Tinea unguium: Secondary | ICD-10-CM | POA: Diagnosis not present

## 2022-06-21 DIAGNOSIS — M79676 Pain in unspecified toe(s): Secondary | ICD-10-CM | POA: Diagnosis not present

## 2022-06-23 ENCOUNTER — Ambulatory Visit: Payer: 59 | Admitting: Internal Medicine

## 2022-06-23 ENCOUNTER — Ambulatory Visit
Admission: RE | Admit: 2022-06-23 | Discharge: 2022-06-23 | Disposition: A | Discharge status: Home / Self Care | Payer: 59 | Source: Ambulatory Visit | Attending: Internal Medicine | Admitting: Internal Medicine

## 2022-06-23 ENCOUNTER — Telehealth: Payer: Self-pay | Admitting: *Deleted

## 2022-06-23 ENCOUNTER — Inpatient Hospital Stay (HOSPITAL_BASED_OUTPATIENT_CLINIC_OR_DEPARTMENT_OTHER): Payer: 59 | Admitting: Internal Medicine

## 2022-06-23 ENCOUNTER — Other Ambulatory Visit: Payer: Self-pay

## 2022-06-23 VITALS — BP 124/58 | HR 95 | Temp 97.5°F | Resp 18 | Ht 68.0 in | Wt 167.6 lb

## 2022-06-23 DIAGNOSIS — R270 Ataxia, unspecified: Secondary | ICD-10-CM | POA: Diagnosis not present

## 2022-06-23 DIAGNOSIS — C7931 Secondary malignant neoplasm of brain: Secondary | ICD-10-CM

## 2022-06-23 DIAGNOSIS — Z7952 Long term (current) use of systemic steroids: Secondary | ICD-10-CM | POA: Diagnosis not present

## 2022-06-23 DIAGNOSIS — C3412 Malignant neoplasm of upper lobe, left bronchus or lung: Secondary | ICD-10-CM | POA: Diagnosis not present

## 2022-06-23 DIAGNOSIS — Z79899 Other long term (current) drug therapy: Secondary | ICD-10-CM | POA: Diagnosis not present

## 2022-06-23 DIAGNOSIS — Z9221 Personal history of antineoplastic chemotherapy: Secondary | ICD-10-CM | POA: Diagnosis not present

## 2022-06-23 DIAGNOSIS — Z923 Personal history of irradiation: Secondary | ICD-10-CM | POA: Diagnosis not present

## 2022-06-23 DIAGNOSIS — C50211 Malignant neoplasm of upper-inner quadrant of right female breast: Secondary | ICD-10-CM | POA: Diagnosis not present

## 2022-06-23 MED ORDER — SODIUM CHLORIDE 0.9% FLUSH
10.0000 mL | INTRAVENOUS | Status: DC | PRN
  Administered 2022-06-23: 10 mL via INTRAVENOUS

## 2022-06-23 MED ORDER — HEPARIN SOD (PORK) LOCK FLUSH 100 UNIT/ML IV SOLN
500.0000 [IU] | Freq: Once | INTRAVENOUS | Status: AC
  Administered 2022-06-23: 500 [IU] via INTRAVENOUS

## 2022-06-23 MED ORDER — DEXAMETHASONE 1 MG PO TABS: ORAL_TABLET | ORAL | 0 refills | Status: AC

## 2022-06-23 MED ORDER — GADOPICLENOL 0.5 MMOL/ML IV SOLN
8.0000 mL | Freq: Once | INTRAVENOUS | Status: AC | PRN
  Administered 2022-06-23: 8 mL via INTRAVENOUS

## 2022-06-23 NOTE — Progress Notes (Signed)
[facility] [address]  [place], [place] [address] [phone]   Interval Evaluation  Date of Service: 06/23/22 Patient Name: Jennifer Cowan Patient MRN: [id number] Patient DOB: 30-Sep-1945 Provider: Henreitta Leber, MD  Identifying Statement:  Jennifer Cowan is a 77 y.o. female with Metastasis to brain [facility])   Primary Cancer:  Oncologic History: Oncology History  Infiltrating duct carcinoma (HCC)  07/25/2021 Initial Diagnosis   Infiltrating duct carcinoma (HCC)   02/27/2022 Genetic Testing   Negative genetic testing on the CancerNext-Expanded+RNAinsight panel.  The report date is February 27, 2022.  The CancerNext-Expanded gene panel offered by [facility] and includes sequencing and rearrangement analysis for the following 77 genes: AIP, ALK, APC*, ATM*, AXIN2, BAP1, BARD1, BLM, BMPR1A, BRCA1*, BRCA2*, BRIP1*, CDC73, CDH1*, CDK4, CDKN1B, CDKN2A, CHEK2*, CTNNA1, DICER1, FANCC, FH, FLCN, GALNT12, KIF1B, LZTR1, MAX, MEN1, MET, MLH1*, MSH2*, MSH3, MSH6*, MUTYH*, NBN, NF1*, NF2, NTHL1, PALB2*, PHOX2B, PMS2*, POT1, PRKAR1A, PTCH1, PTEN*, RAD51C*, RAD51D*, RB1, RECQL, RET, SDHA, SDHAF2, SDHB, SDHC, SDHD, SMAD4, SMARCA4, SMARCB1, SMARCE1, STK11, SUFU, TMEM127, TP53*, TSC1, TSC2, VHL and XRCC2 (sequencing and deletion/duplication); EGFR, EGLN1, HOXB13, KIT, MITF, PDGFRA, POLD1, and POLE (sequencing only); EPCAM and GREM1 (deletion/duplication only). DNA and RNA analyses performed for * genes.    06/15/2022 Cancer Staging   Staging form: Breast, AJCC 8th Edition - Clinical: Stage IIIB (cT3, cN0, cM0, G3, ER-, PR-, HER2-) - Signed by Rachel Moulds, MD on 06/15/2022 Histologic grading system: 3 grade system   Malignant neoplasm of unspecified part of unspecified bronchus or lung (HCC)  08/13/2021 Initial Diagnosis   Malignant neoplasm of unspecified part of unspecified bronchus or lung (HCC)   08/30/2021 - 09/13/2021 Chemotherapy   Patient is on  Treatment Plan : LUNG Carboplatin / Paclitaxel + XRT q7d     08/30/2021 - 10/11/2021 Chemotherapy   Patient is on Treatment Plan : LUNG Carboplatin + Paclitaxel + XRT q7d     11/16/2021 - 02/17/2022 Chemotherapy   Patient is on Treatment Plan : LUNG Pembrolizumab Q 21D     03/16/2022 - 03/16/2022 Chemotherapy   Patient is on Treatment Plan : BREAST Pembrolizumab (400) D1 + Carboplatin (1.5) weekly + Paclitaxel (80) weekly q42d X 2 cycles / Pembrolizumab (400) D1 + AC D1,22 q42d x 2 cycles     03/18/2022 -  Chemotherapy   Patient is on Treatment Plan : BREAST Pembrolizumab (200) D1 + Carboplatin (2) D1,8,15 + Paclitaxel (45) D1,8,15 q21d X 2 cycles     06/15/2022 Cancer Staging   Staging form: Lung, AJCC 8th Edition - Clinical: Stage IV (cTX, cM1) - Signed by Rachel Moulds, MD on 06/15/2022    CNS Oncologic History 08/20/21: Completes SRS to 6 metastases, including frx to large R temporal and L frontal lesions Jennifer Cowan)  Interval History: Jennifer Cowan presents today for evaluation following recent MRI brain, ED visit.  She did experience a first ever seizure, characterized by "staring, body shaking for 1 minute, followed by confusion".  Does have mastectomy surgery upcoming.  No weakness, numbness, speech difficulty.  Currently dosing decadron 4mg  three times per day.  Medications: Current Outpatient Medications on File Prior to Visit  Medication Sig Dispense Refill   Accu-Chek Softclix Lancets lancets Use to check blood sugar as directed 4 times a day 100 each 1   acetaminophen (TYLENOL) 500 MG tablet Take 1,000 mg by mouth every 6 (six) hours as needed for moderate pain.     amLODipine (NORVASC)  10 MG tablet TAKE 1 TABLET BY MOUTH DAILY 90 tablet 3   atorvastatin (LIPITOR) 80 MG tablet Take 1 tablet (80 mg total) by mouth daily. 90 tablet 3   benazepril (LOTENSIN) 10 MG tablet TAKE 1 TABLET BY MOUTH DAILY 90 tablet 3   blood glucose meter kit and supplies KIT Dispense based on patient and  insurance preference. Use up to four times daily as directed. 1 each 0   Blood Glucose Monitoring Suppl (ACCU-CHEK GUIDE) w/Device KIT Use as directed 4 times a day 1 kit 0   Cholecalciferol (VITAMIN D3) 125 MCG (5000 UT) CAPS Take 5,000 Units by mouth daily.     dexamethasone (DECADRON) 4 MG tablet Take 1 tablet (4 mg total) by mouth 3 (three) times daily for 14 days. 42 tablet 0   EQ ASPIRIN 325 MG tablet Take 1 tablet by mouth once daily 60 tablet 0   gabapentin (NEURONTIN) 100 MG capsule Take 3 capsules (300 mg total) by mouth 2 (two) times daily. 60 capsule 2   glucose blood (ACCU-CHEK GUIDE) test strip Use as directed 4 times a day 100 each 1   ketoconazole (NIZORAL) 2 % cream Apply 1 Application topically 2 (two) times daily.     levETIRAcetam (KEPPRA) 500 MG tablet Take 1 tablet (500 mg total) by mouth 2 (two) times daily. 60 tablet 0   LORazepam (ATIVAN) 0.5 MG tablet 1 tab po 30 minutes prior to radiation or MRI scans 30 tablet 0   meloxicam (MOBIC) 7.5 MG tablet TAKE 1 TABLET BY MOUTH DAILY 90 tablet 3   spironolactone (ALDACTONE) 25 MG tablet Take 0.5 tablets (12.5 mg total) by mouth daily. 45 tablet 3   trolamine salicylate (ASPERCREME) 10 % cream Apply 1 Application topically daily as needed for muscle pain.     Current Facility-Administered Medications on File Prior to Visit  Medication Dose Route Frequency Provider Last Rate Last Admin   acetaminophen (TYLENOL) 325 MG tablet            cyanocobalamin (VITAMIN B12) 1000 MCG/ML injection            diphenhydrAMINE (BENADRYL) 25 mg capsule            gadopiclenol (VUEWAY) 0.5 MMOL/ML solution 7.5 mL  7.5 mL Intravenous Once PRN Margaretmary Dys, MD       heparin lock flush 100 unit/mL  500 Units Intravenous Once Dorothy Puffer, MD       sodium chloride flush (NS) 0.9 % injection 10 mL  10 mL Intravenous Once Dorothy Puffer, MD       sodium chloride flush (NS) 0.9 % injection 10 mL  10 mL Intravenous PRN Henreitta Leber, MD   10 mL at  06/23/22 1213    Allergies: No Known Allergies Past Medical History:  Past Medical History:  Diagnosis Date   Anemia    Arthritis    Breast cancer (HCC)    Cancer (HCC) 1999   Breast Cancer   Family history of breast cancer    Hyperlipidemia    Hypertension    Osteopenia 01/12/2021   Personal history of malignant neoplasm of breast    Past Surgical History:  Past Surgical History:  Procedure Laterality Date   BREAST SURGERY  1999   Masectomy- left   BRONCHIAL BIOPSY  07/30/2021   Procedure: BRONCHIAL BIOPSIES;  Surgeon: Leslye Peer, MD;  Location: [facility] ENDOSCOPY;  Service: Pulmonary;;   BRONCHIAL BRUSHINGS  07/30/2021   Procedure: BRONCHIAL BRUSHINGS;  Surgeon: Leslye Peer, MD;  Location: [facility] ENDOSCOPY;  Service: Pulmonary;;   BRONCHIAL NEEDLE ASPIRATION BIOPSY  07/30/2021   Procedure: BRONCHIAL NEEDLE ASPIRATION BIOPSIES;  Surgeon: Leslye Peer, MD;  Location: [facility] ENDOSCOPY;  Service: Pulmonary;;   CYSTECTOMY     cyst removed off top of head   IR IMAGING GUIDED PORT INSERTION  08/20/2021   MASTECTOMY     VIDEO BRONCHOSCOPY WITH RADIAL ENDOBRONCHIAL ULTRASOUND  07/30/2021   Procedure: VIDEO BRONCHOSCOPY WITH RADIAL ENDOBRONCHIAL ULTRASOUND;  Surgeon: Leslye Peer, MD;  Location: [facility] ENDOSCOPY;  Service: Pulmonary;;   Social History:  Social History   Socioeconomic History   Marital status: Single    Spouse name: Not on file   Number of children: 0   Years of education: 12   Highest education level: High school graduate  Occupational History   Occupation: retired  Tobacco Use   Smoking status: Never   Smokeless tobacco: Never  Vaping Use   Vaping Use: Never used  Substance and Sexual Activity   Alcohol use: No   Drug use: Never   Sexual activity: Yes    Birth control/protection: Post-menopausal  Other Topics Concern   Not on file  Social History Narrative   Not on file   Social Determinants of Health   Financial Resource Strain: Not on file  Food  Insecurity: Not on file  Transportation Needs: Not on file  Physical Activity: Not on file  Stress: Not on file  Social Connections: Not on file  Intimate Partner Violence: Not on file   Family History:  Family History  Problem Relation Age of Onset   Arthritis Mother    Breast cancer Sister 51   Diabetes Sister    Hyperlipidemia Sister    Hypertension Sister    Stroke Sister    Dementia Sister    Diabetes Brother    Hypertension Brother    Breast cancer Niece 35    Review of Systems: Constitutional: Doesn't report fevers, chills or abnormal weight loss Eyes: Doesn't report blurriness of vision Ears, nose, mouth, throat, and face: Doesn't report sore throat Respiratory: Doesn't report cough, dyspnea or wheezes Cardiovascular: Doesn't report palpitation, chest discomfort  Gastrointestinal:  Doesn't report nausea, constipation, diarrhea GU: Doesn't report incontinence Skin: Doesn't report skin rashes Neurological: Per HPI Musculoskeletal: Doesn't report joint pain Behavioral/Psych: Doesn't report anxiety  Physical Exam: Vitals:   06/23/22 1449  BP: (!) 124/58  Pulse: 95  Resp: 18  Temp: (!) 97.5 F (36.4 C)  SpO2: 98%   KPS: 70. General: Alert, cooperative, pleasant, in no acute distress Head: Normal EENT: No conjunctival injection or scleral icterus.  Lungs: Resp effort normal Cardiac: Regular rate Abdomen: Non-distended abdomen Skin: No rashes cyanosis or petechiae. Extremities: No clubbing or edema  Neurologic Exam: Mental Status: Awake, alert, attentive to examiner. Oriented to self and environment. Language is fluent with intact comprehension.  Pyschomotor slowing, impaired recall Cranial Nerves: Visual acuity is grossly normal. Visual fields are full. Extra-ocular movements intact. No ptosis. Face is symmetric Motor: Tone and bulk are normal. Power is full in both arms and legs. Reflexes are symmetric, no pathologic reflexes present.  Sensory: Intact to  light touch Gait: deferred   Labs: I have reviewed the data as listed    Component Value Date/Time   NA 138 06/11/2022 1804   NA 141 07/07/2021 1538   K 4.2 06/11/2022 1804   CL 106 06/11/2022 1804   CO2 24 06/11/2022 1804   GLUCOSE  117 (H) 06/11/2022 1804   BUN 17 06/11/2022 1804   BUN 9 07/07/2021 1538   CREATININE 0.65 06/11/2022 1804   CREATININE 0.56 05/06/2022 0845   CALCIUM 9.1 06/11/2022 1804   PROT 6.5 06/11/2022 1804   PROT 7.9 07/07/2021 1538   ALBUMIN 3.4 (L) 06/11/2022 1804   ALBUMIN 4.6 07/07/2021 1538   AST 15 06/11/2022 1804   AST 14 (L) 05/06/2022 0845   ALT 17 06/11/2022 1804   ALT 19 05/06/2022 0845   ALKPHOS 75 06/11/2022 1804   BILITOT 0.7 06/11/2022 1804   BILITOT 0.4 05/06/2022 0845   GFRNONAA >60 06/11/2022 1804   GFRNONAA >60 05/06/2022 0845   GFRAA 103 03/05/2020 1119   Lab Results  Component Value Date   WBC 4.9 06/11/2022   NEUTROABS 2.8 06/11/2022   HGB 13.1 06/11/2022   HCT 40.1 06/11/2022   MCV 97.3 06/11/2022   PLT 354 06/11/2022   Imaging:  [facility] Clinician Interpretation: I have personally reviewed the CNS images as listed.  My interpretation, in the context of the patient's clinical presentation, is stable disease pending official read  CT Head Wo Contrast  Result Date: 06/11/2022 CLINICAL DATA:  Seizure, new-onset, no history of trauma Mental status change, unknown cause EXAM: CT HEAD WITHOUT CONTRAST TECHNIQUE: Contiguous axial images were obtained from the base of the skull through the vertex without intravenous contrast. RADIATION DOSE REDUCTION: This exam was performed according to the departmental dose-optimization program which includes automated exposure control, adjustment of the mA and/or kV according to patient size and/or use of iterative reconstruction technique. COMPARISON:  MRI 05/12/2022 FINDINGS: Brain: Right temporal mass difficult to visualize, better seen on MR imaging. Extensive surrounding edema. Edema appears to  have increased since prior study with 4 mm of right to left midline shift. No hydrocephalus. No areas of hemorrhage. Vascular: No hyperdense vessel or unexpected calcification. Skull: No acute calvarial abnormality. Sinuses/Orbits: No acute findings Other: None IMPRESSION: Right temporal mass seen on prior MRI not as well visualized on this noncontrast CT. Increasing surrounding edema with 4 mm of right to left midline shift. Electronically Signed   By: Charlett Nose M.D.   On: 06/11/2022 18:19     Assessment/Plan Metastasis to brain [facility])  Jennifer Cowan is clinically stable today, now having recovered from first ever seizure.  She is tolerating Keppra well, though dose have some fatigue.  MRI brain demonstrates essentially stable findings within right temporal lesion, though motion artifact limits utility. Etiology remains favored radiation necrosis over neoplasm, due to very large initial lesion, timing of progression, concurrent immunotherapy.  We recommended close MRI surveillance, with repeat study in 2 months.  Decadron should decrease to 4mg  daily x7 days, then decrease by 1mg  daily each week thereafter.  Keppra should remain 500mg  BID for seizure prevention.  We recommended continuing Aspirin 325mg  daily for stroke prevention, she will also continue high dose statin.    We appreciate the opportunity to participate in the care of VESTA DELASHMIT.   We ask that Jennifer Cowan return to clinic in 2 months following next brain MRI, or sooner as needed.  All questions were answered. The patient knows to call the clinic with any problems, questions or concerns. No barriers to learning were detected.  The total time spent in the encounter was 40 minutes and more than 50% was on counseling and review of test results   Henreitta Leber, MD Medical Director of Neuro-Oncology [facility] [place]  [place] 06/23/22 2:43 PM

## 2022-06-23 NOTE — Telephone Encounter (Signed)
Referral for [facility] PT [facility] OT faxed to [facility], [phone].  Copy of patient demographic sheet, referral & today's office note sent, fax confirmation received.

## 2022-06-23 NOTE — H&P (Signed)
REFERRING PHYSICIAN: Iruku  PROVIDER: Matthias Hughs, MD  Care Team: Patient Care Team: [name] as PCP - General (Family Medicine) Matthias Hughs, MD as Consulting Provider (Surgical Oncology) Iruku, Shawn Stall, MD (Hematology and Oncology) Bensimhon, Neill Loft, MD (Cardiovascular Disease) Jonna Coup, MD (Radiation Oncology)   MRN: [id number] DOB: 1945/09/18  Subjective   Chief Complaint: NEW BREAST CANCER (Eval of Rt Br Malignancy)  History of Present Illness: Jennifer Cowan is a 76 y.o. female who is seen today as an office consultation at the request of Dr. Al Pimple for evaluation of NEW BREAST CANCER (Eval of Rt Br Malignancy)  Patient was diagnosed with right breast cancer July 2023. A right mass was noted on mammogram in May, but before she was able to get a biopsy, she was found to have brain lesions. She was having progressive functional decline. She required admission to the hospital 07/2021 and workup revealed that she also had lung cancer with brain mets. She was tx with steroids and stereotactic XRT to the brain mets. She received CarboTaxol with immunotherapy and has appeared to have no active disease based on recent PET scan. When she stopped chemotherapy, her right breast mass began to grow again. She had repeat breast imaging which still showed 3.7 cm right breast mass with no evidence of adenopathy.   She presents to discuss surgical treatment of the right breast cancer. 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  Family cancer history: sister and niece with breast cancer  Diagnostic mammogram/us 05/02/22 ACR Breast Density Category b: There are scattered areas of fibroglandular density.  FINDINGS: 2D/3D spot compression views of the RIGHT breast demonstrate decrease in size biopsy-proven malignancy within the RETROAREOLAR RIGHT breast. No other changes are noted.  Targeted ultrasound is performed,  showing a 3.7 x 2.4 x 1.8 cm irregular hypoechoic mass at the 3 o'clock position of the RIGHT breast 3 cm from the nipple, previously measuring 4.9 x 5 x 3.9 cm on 02/28/2022.  No abnormal RIGHT axillary lymph nodes are noted.  IMPRESSION: 1. Treatment response/decrease in size of biopsy-proven INNER RIGHT breast malignancy, now measuring 3.7 x 2.4 x 1.8 cm, previously measuring 4.9 x 5 x 3.9 cm on 02/28/2022. No abnormal appearing RIGHT axillary lymph nodes.  RECOMMENDATION: Treatment plan  I have discussed the findings and recommendations with the patient. If applicable, a reminder letter will be sent to the patient regarding the next appointment.  BI-RADS CATEGORY 6: Known biopsy-proven malignancy.  Pathology core needle biopsy: 07/26/2021 A. BREAST, RIGHT RETROAREOLAR, BIOPSY:  - Invasive ductal carcinoma, grade 3  Receptors:  Estrogen Receptor: NEGATIVE  Progesterone Receptor: NEGATIVE  Proliferation Marker Ki-67: 90%  GROUP 5: HER2 **NEGATIVE**   PET scan 05/08/2022 IMPRESSION: Radiation changes in the left upper lobe/paramediastinal region in this patient with lung cancer. No focal hypermetabolism to suggest residual/recurrent tumor.  Improving central right breast mass in this patient with right breast cancer.  No findings to suggest metastatic disease. Stable right lower lobe pulmonary nodule without hypermetabolism.  Additional stable ancillary findings as above.  Review of Systems: A complete review of systems was obtained from the patient. I have reviewed this information and discussed as appropriate with the patient. See HPI as well for other ROS.  Medical History: Past Medical History:  Diagnosis Date  History of cancer  Hyperlipidemia   Patient Active Problem List  Diagnosis  Malignant neoplasm of upper-inner quadrant of right breast in female, estrogen  receptor negative (CMS/HHS-HCC)  Malignant neoplasm of unspecified part of left bronchus or  lung (CMS/HHS-HCC)  Lung cancer metastatic to brain (CMS/HHS-HCC)  H/O left mastectomy   Past Surgical History:  Procedure Laterality Date  MASTECTOMY 1995    No Known Allergies  Current Outpatient Medications on File Prior to Visit  Medication Sig Dispense Refill  acetaminophen (TYLENOL) 500 MG tablet Take 1,000 mg by mouth every 6 (six) hours as needed  amLODIPine (NORVASC) 10 MG tablet Take 1 tablet by mouth once daily  aspirin 325 MG tablet Take 325 mg by mouth once daily  atorvastatin (LIPITOR) 80 MG tablet Take 80 mg by mouth once daily  benazepriL (LOTENSIN) 10 MG tablet Take 1 tablet by mouth once daily  cholecalciferol (VITAMIN D3) 5,000 unit capsule Take 5,000 Units by mouth once daily  gabapentin (NEURONTIN) 100 MG capsule Take 300 mg by mouth 2 (two) times daily  ketoconazole (NIZORAL) 2 % cream Apply 1 Application topically 2 (two) times daily  LORazepam (ATIVAN) 0.5 MG tablet 1 tab po 30 minutes prior to radiation or MRI scans  meloxicam (MOBIC) 7.5 MG tablet Take 1 tablet by mouth once daily  spironolactone (ALDACTONE) 25 MG tablet Take 12.5 mg by mouth once daily  trolamine salicylate (ASPERCREME) 10 % cream Apply topically   No current facility-administered medications on file prior to visit.   History reviewed. No pertinent family history.   Social History   Tobacco Use  Smoking Status Never  Passive exposure: Never  Smokeless Tobacco Never    Social History   Socioeconomic History  Marital status: Single  Tobacco Use  Smoking status: Never  Passive exposure: Never  Smokeless tobacco: Never  Substance and Sexual Activity  Alcohol use: Never  Drug use: Never   Objective:   Vitals:   BP: 118/69  Pulse: (!) 111  Temp: 36.2 C (97.2 F)  SpO2: 99%  Weight: 78 kg (172 lb)  Height: 172.7 cm (5\' 8" )  PainSc: 0-No pain  PainLoc: Breast   Body mass index is 26.15 kg/m.  Gen: No acute distress. Well nourished and well groomed.  Neurological:  Alert and oriented to person, place, and time. Coordination normal.  Head: Normocephalic and atraumatic.  Eyes: Conjunctivae are normal. Pupils are equal, round, and reactive to light. No scleral icterus.  Neck: Normal range of motion. Neck supple. No tracheal deviation or thyromegaly present.  Cardiovascular: Normal rate, regular rhythm, normal heart sounds and intact distal pulses. Exam reveals no gallop and no friction rub. No murmur heard. Breast: left breast surgically absent. I cannot palpate the mass in the right breast. No palpable LAD. No nipple retraction or nipple discharge.  Respiratory: Effort normal. No respiratory distress. No chest wall tenderness. Breath sounds normal. No wheezes, rales or rhonchi.  GI: Soft. Bowel sounds are normal. The abdomen is soft and nontender. There is no rebound and no guarding.  Musculoskeletal: Normal range of motion. Extremities are nontender.  Lymphadenopathy: No cervical, preauricular, postauricular or axillary adenopathy is present Skin: Skin is warm and dry. No rash noted. No diaphoresis. No erythema. No pallor. No clubbing, cyanosis. +1 edema.  Psychiatric: Normal mood and affect. Behavior is normal. Judgment and thought content normal.   Labs CBC and CMET near normal 05/05/2021.     Assessment and Plan:   ICD-10-CM  1. Malignant neoplasm of unspecified part of left bronchus or lung (CMS/HHS-HCC) C34.92   2. Malignant neoplasm of upper-inner quadrant of right breast in female, estrogen receptor negative (CMS/HHS-HCC)  C50.211  Z17.1   3. Lung cancer metastatic to brain (CMS/HHS-HCC) C34.90  C79.31   4. H/O left mastectomy Z90.12    Patient appears to have no active lung mets at this time. She appears to have treated the cancer.  The breast cancer does appear that it was being successfully treated with the chemotherapy, but off the chemotherapy is started to grow again. I think despite the stage IV lung cancer, it is reasonable to  treat the right breast cancer. We do not have evidence that this has metastasized. I offered her choice of lumpectomy versus mastectomy. I presume I would still do a sentinel node biopsy with either of these surgeries.  The patient is status post left mastectomy in the 90s for left breast cancer. I instructed her that the indications for radiation at the very least are tumor greater than 5 cm, axillary lymphadenopathy, or positive margins. She was certainly very close to 5 cm diagnosis. Thankfully, this is the right breast this time, so she will still be able to receive radiation presumably. Her lung cancer is on the left.  I discussed pros and cons of both approaches. I advised the patient that if she has lumpectomy she likely would receive radiation. I discussed that lumpectomy is an easier surgery to recover from. I discussed that mastectomy is a bigger surgery that requires drain and stay within the hospital. There is a longer recovery time as well. There is a risk of blood transfusion and potential need for reoperation.  She is leaning toward a right mastectomy with hopes of avoiding radiation.

## 2022-06-24 ENCOUNTER — Other Ambulatory Visit: Payer: Self-pay

## 2022-06-24 ENCOUNTER — Encounter (HOSPITAL_COMMUNITY): Payer: Self-pay | Admitting: General Surgery

## 2022-06-24 ENCOUNTER — Telehealth: Payer: Self-pay | Admitting: Internal Medicine

## 2022-06-24 NOTE — Progress Notes (Signed)
Chart reviewed with Dr. Hart Rochester, due to new dx of seizure, patient will be moved to main OR for surgery.

## 2022-06-24 NOTE — Progress Notes (Signed)
SDW call  Patient was given pre-op instructions over the phone. Patient verbalized understanding of instructions provided.    PCP - Dr. Harlow Mares Cardiologist - denies Pulmonary:    PPM/ICD - denies  Chest x-ray - n/a EKG -  06/14/2022 Stress Test - ECHO - 04/19/2022 Cardiac Cath -   Sleep Study/sleep apnea/CPAP: denies  Type II.   Niece states patient is no longer diabetic, they do not check blood sugars and she no longer takes medications for diabetes Fasting Blood sugar range: Does not check her sugars How often check sugars: Does not check her sugars   Blood Thinner Instructions: denies Aspirin Instructions:States last dose 06/21/2022   ERAS Protcol - no, NPO PRE-SURGERY Ensure or G2-    COVID TEST- n/a    Anesthesia review: No, chart was reviewed by Anesthesia 06/20/2022   Patient denies shortness of breath, fever, cough and chest pain over the phone call  Your procedure is scheduled on Monday June 27, 2022  Report to [facility] Main Entrance "A" at 0530  A.M., then check in with the Admitting office.  Call this number if you have problems the morning of surgery:  [phone]   If you have any questions prior to your surgery date call [phone]: Open Monday-Friday 8am-4pm If you experience any cold or flu symptoms such as cough, fever, chills, shortness of breath, etc. between now and your scheduled surgery, please notify us at the above number    Remember:  Do not eat or drink after midnight the night before your surgery   Take these medicines the morning of surgery with A SIP OF WATER:  Amlodipine, gabapentin, keppra  As needed: Tylenol  As of today, STOP taking any Aleve, Naproxen, Ibuprofen, Motrin, Advil, Goody's, BC's, all herbal medications, fish oil, and all vitamins.

## 2022-06-24 NOTE — Telephone Encounter (Signed)
Left a message regarding patients upcoming appointment times/dates.

## 2022-06-26 NOTE — Anesthesia Preprocedure Evaluation (Signed)
Anesthesia Evaluation  Patient identified by MRN, date of birth, ID band Patient awake    Reviewed: Allergy & Precautions, H&P , NPO status , Patient's Chart, lab work & pertinent test results  Airway Mallampati: II  TM Distance: >3 FB Neck ROM: Full    Dental no notable dental hx. (+) Edentulous Upper, Partial Lower, Dental Advisory Given   Pulmonary neg pulmonary ROS H/o lung CA with mets to the brain   Pulmonary exam normal breath sounds clear to auscultation       Cardiovascular Exercise Tolerance: Good hypertension, Pt. on medications  Rhythm:Regular Rate:Normal     Neuro/Psych CVA  negative psych ROS   GI/Hepatic negative GI ROS, Neg liver ROS,,,  Endo/Other  negative endocrine ROS    Renal/GU negative Renal ROS  negative genitourinary   Musculoskeletal  (+) Arthritis , Osteoarthritis,    Abdominal   Peds  Hematology  (+) Blood dyscrasia, anemia   Anesthesia Other Findings   Reproductive/Obstetrics negative OB ROS                             Anesthesia Physical Anesthesia Plan  ASA: 3  Anesthesia Plan: General   Post-op Pain Management: Regional block* and Tylenol PO (pre-op)*   Induction: Intravenous  PONV Risk Score and Plan: 4 or greater and Ondansetron, Dexamethasone and Treatment may vary due to age or medical condition  Airway Management Planned: LMA  Additional Equipment:   Intra-op Plan:   Post-operative Plan: Extubation in OR  Informed Consent: I have reviewed the patients History and Physical, chart, labs and discussed the procedure including the risks, benefits and alternatives for the proposed anesthesia with the patient or authorized representative who has indicated his/her understanding and acceptance.     Dental advisory given  Plan Discussed with: CRNA  Anesthesia Plan Comments:        Anesthesia Quick Evaluation

## 2022-06-27 ENCOUNTER — Ambulatory Visit (HOSPITAL_BASED_OUTPATIENT_CLINIC_OR_DEPARTMENT_OTHER): Payer: 59 | Admitting: Certified Registered"

## 2022-06-27 ENCOUNTER — Other Ambulatory Visit: Payer: Self-pay

## 2022-06-27 ENCOUNTER — Ambulatory Visit (HOSPITAL_COMMUNITY)
Admission: RE | Admit: 2022-06-27 | Discharge: 2022-06-27 | Disposition: A | Discharge status: Home / Self Care | Payer: 59 | Source: Ambulatory Visit | Attending: General Surgery | Admitting: General Surgery

## 2022-06-27 ENCOUNTER — Inpatient Hospital Stay: Payer: 59

## 2022-06-27 ENCOUNTER — Ambulatory Visit (HOSPITAL_COMMUNITY): Payer: 59 | Admitting: Certified Registered"

## 2022-06-27 ENCOUNTER — Encounter (HOSPITAL_COMMUNITY): Payer: Self-pay | Admitting: General Surgery

## 2022-06-27 ENCOUNTER — Inpatient Hospital Stay (HOSPITAL_BASED_OUTPATIENT_CLINIC_OR_DEPARTMENT_OTHER)
Admission: AD | Admit: 2022-06-27 | Discharge: 2022-06-30 | DRG: 580 | Disposition: A | Discharge status: Home Health | Payer: 59 | Attending: General Surgery | Admitting: General Surgery

## 2022-06-27 ENCOUNTER — Encounter (HOSPITAL_COMMUNITY)
Admission: AD | Disposition: A | Discharge status: Home Health | Payer: Self-pay | Source: Home / Self Care | Attending: General Surgery

## 2022-06-27 DIAGNOSIS — N6489 Other specified disorders of breast: Secondary | ICD-10-CM | POA: Diagnosis not present

## 2022-06-27 DIAGNOSIS — R569 Unspecified convulsions: Secondary | ICD-10-CM | POA: Diagnosis not present

## 2022-06-27 DIAGNOSIS — D630 Anemia in neoplastic disease: Secondary | ICD-10-CM

## 2022-06-27 DIAGNOSIS — Z791 Long term (current) use of non-steroidal anti-inflammatories (NSAID): Secondary | ICD-10-CM | POA: Diagnosis not present

## 2022-06-27 DIAGNOSIS — E785 Hyperlipidemia, unspecified: Secondary | ICD-10-CM | POA: Diagnosis present

## 2022-06-27 DIAGNOSIS — E119 Type 2 diabetes mellitus without complications: Secondary | ICD-10-CM | POA: Diagnosis not present

## 2022-06-27 DIAGNOSIS — I10 Essential (primary) hypertension: Secondary | ICD-10-CM | POA: Diagnosis present

## 2022-06-27 DIAGNOSIS — Z9012 Acquired absence of left breast and nipple: Secondary | ICD-10-CM

## 2022-06-27 DIAGNOSIS — M7989 Other specified soft tissue disorders: Secondary | ICD-10-CM | POA: Diagnosis not present

## 2022-06-27 DIAGNOSIS — Z171 Estrogen receptor negative status [ER-]: Secondary | ICD-10-CM

## 2022-06-27 DIAGNOSIS — C3492 Malignant neoplasm of unspecified part of left bronchus or lung: Secondary | ICD-10-CM

## 2022-06-27 DIAGNOSIS — M1990 Unspecified osteoarthritis, unspecified site: Secondary | ICD-10-CM | POA: Diagnosis not present

## 2022-06-27 DIAGNOSIS — C7931 Secondary malignant neoplasm of brain: Secondary | ICD-10-CM | POA: Diagnosis present

## 2022-06-27 DIAGNOSIS — C50211 Malignant neoplasm of upper-inner quadrant of right female breast: Secondary | ICD-10-CM | POA: Diagnosis not present

## 2022-06-27 DIAGNOSIS — N641 Fat necrosis of breast: Secondary | ICD-10-CM | POA: Diagnosis not present

## 2022-06-27 DIAGNOSIS — Z9221 Personal history of antineoplastic chemotherapy: Secondary | ICD-10-CM

## 2022-06-27 DIAGNOSIS — E669 Obesity, unspecified: Secondary | ICD-10-CM | POA: Diagnosis present

## 2022-06-27 DIAGNOSIS — G8918 Other acute postprocedural pain: Secondary | ICD-10-CM | POA: Diagnosis not present

## 2022-06-27 DIAGNOSIS — Z01818 Encounter for other preprocedural examination: Secondary | ICD-10-CM

## 2022-06-27 DIAGNOSIS — Z79899 Other long term (current) drug therapy: Secondary | ICD-10-CM

## 2022-06-27 DIAGNOSIS — M8580 Other specified disorders of bone density and structure, unspecified site: Secondary | ICD-10-CM | POA: Diagnosis not present

## 2022-06-27 DIAGNOSIS — C50911 Malignant neoplasm of unspecified site of right female breast: Secondary | ICD-10-CM | POA: Diagnosis not present

## 2022-06-27 HISTORY — PX: MASTECTOMY W/ SENTINEL NODE BIOPSY: SHX2001

## 2022-06-27 LAB — GLUCOSE, CAPILLARY: Glucose-Capillary: 110 mg/dL — ABNORMAL HIGH (ref 70–99)

## 2022-06-27 SURGERY — MASTECTOMY WITH SENTINEL LYMPH NODE BIOPSY
Anesthesia: General | Site: Breast | Laterality: Right

## 2022-06-27 MED ORDER — VITAMIN D 25 MCG (1000 UNIT) PO TABS
5000.0000 [IU] | ORAL_TABLET | Freq: Every day | ORAL | Status: DC
  Administered 2022-06-27 – 2022-06-30 (×4): 5000 [IU] via ORAL
  Filled 2022-06-27 (×4): qty 5

## 2022-06-27 MED ORDER — DEXAMETHASONE 4 MG PO TABS
4.0000 mg | ORAL_TABLET | Freq: Every day | ORAL | Status: DC
  Administered 2022-06-28 – 2022-06-30 (×3): 4 mg via ORAL
  Filled 2022-06-27 (×3): qty 1

## 2022-06-27 MED ORDER — MELATONIN 3 MG PO TABS: 3.0000 mg | ORAL_TABLET | Freq: Every evening | ORAL | Status: DC | PRN

## 2022-06-27 MED ORDER — ONDANSETRON HCL 4 MG/2ML IJ SOLN
INTRAMUSCULAR | Status: DC | PRN
  Administered 2022-06-27: 4 mg via INTRAVENOUS

## 2022-06-27 MED ORDER — ONDANSETRON HCL 4 MG/2ML IJ SOLN: 4.0000 mg | Freq: Four times a day (QID) | INTRAMUSCULAR | Status: DC | PRN

## 2022-06-27 MED ORDER — LEVETIRACETAM 500 MG PO TABS
500.0000 mg | ORAL_TABLET | Freq: Two times a day (BID) | ORAL | Status: DC
  Administered 2022-06-27 – 2022-06-30 (×6): 500 mg via ORAL
  Filled 2022-06-27 (×6): qty 1

## 2022-06-27 MED ORDER — CEFAZOLIN SODIUM-DEXTROSE 2-4 GM/100ML-% IV SOLN
2.0000 g | Freq: Once | INTRAVENOUS | Status: AC
  Administered 2022-06-27: 2 g via INTRAVENOUS
  Filled 2022-06-27: qty 100

## 2022-06-27 MED ORDER — FERROUS SULFATE 325 (65 FE) MG PO TABS
325.0000 mg | ORAL_TABLET | Freq: Every day | ORAL | Status: DC
  Administered 2022-06-28 – 2022-06-30 (×3): 325 mg via ORAL
  Filled 2022-06-27 (×3): qty 1

## 2022-06-27 MED ORDER — TRAMADOL HCL 50 MG PO TABS: 50.0000 mg | ORAL_TABLET | Freq: Four times a day (QID) | ORAL | Status: DC | PRN

## 2022-06-27 MED ORDER — TECHNETIUM TC 99M TILMANOCEPT KIT
1.0000 | PACK | Freq: Once | INTRAVENOUS | Status: AC | PRN
  Administered 2022-06-27: 1 via INTRADERMAL

## 2022-06-27 MED ORDER — DEXAMETHASONE SODIUM PHOSPHATE 10 MG/ML IJ SOLN
INTRAMUSCULAR | Status: DC | PRN
  Administered 2022-06-27: 10 mg via INTRAVENOUS

## 2022-06-27 MED ORDER — MORPHINE SULFATE (PF) 2 MG/ML IV SOLN
1.0000 mg | INTRAVENOUS | Status: DC | PRN
  Administered 2022-06-27 – 2022-06-30 (×2): 2 mg via INTRAVENOUS
  Filled 2022-06-27 (×2): qty 1

## 2022-06-27 MED ORDER — KETOCONAZOLE 2 % EX CREA
1.0000 | TOPICAL_CREAM | Freq: Two times a day (BID) | CUTANEOUS | Status: DC
  Administered 2022-06-27 – 2022-06-30 (×6): 1 via TOPICAL
  Filled 2022-06-27: qty 15

## 2022-06-27 MED ORDER — AMLODIPINE BESYLATE 10 MG PO TABS
10.0000 mg | ORAL_TABLET | Freq: Every day | ORAL | Status: DC
  Administered 2022-06-29: 10 mg via ORAL
  Filled 2022-06-27: qty 1

## 2022-06-27 MED ORDER — ACETAMINOPHEN 500 MG PO TABS
1000.0000 mg | ORAL_TABLET | Freq: Once | ORAL | Status: AC
  Administered 2022-06-27: 1000 mg via ORAL
  Filled 2022-06-27: qty 2

## 2022-06-27 MED ORDER — MAGTRACE LYMPHATIC TRACER
INTRAMUSCULAR | Status: DC | PRN
  Administered 2022-06-27: 2 mL via INTRAMUSCULAR

## 2022-06-27 MED ORDER — BENAZEPRIL HCL 5 MG PO TABS
10.0000 mg | ORAL_TABLET | Freq: Every day | ORAL | Status: DC
  Administered 2022-06-29: 10 mg via ORAL
  Filled 2022-06-27 (×2): qty 2

## 2022-06-27 MED ORDER — TRANEXAMIC ACID 1000 MG/10ML IV SOLN
2000.0000 mg | Freq: Once | INTRAVENOUS | Status: AC
  Administered 2022-06-27: 2000 mg via TOPICAL
  Filled 2022-06-27: qty 20

## 2022-06-27 MED ORDER — LACTATED RINGERS IV SOLN: INTRAVENOUS | Status: DC

## 2022-06-27 MED ORDER — PHENYLEPHRINE 80 MCG/ML (10ML) SYRINGE FOR IV PUSH (FOR BLOOD PRESSURE SUPPORT)
PREFILLED_SYRINGE | INTRAVENOUS | Status: DC | PRN
  Administered 2022-06-27 (×4): 80 ug via INTRAVENOUS
  Administered 2022-06-27: 160 ug via INTRAVENOUS

## 2022-06-27 MED ORDER — METHOCARBAMOL 500 MG PO TABS: 500.0000 mg | ORAL_TABLET | Freq: Four times a day (QID) | ORAL | Status: DC | PRN

## 2022-06-27 MED ORDER — 0.9 % SODIUM CHLORIDE (POUR BTL) OPTIME
TOPICAL | Status: DC | PRN
  Administered 2022-06-27: 1000 mL

## 2022-06-27 MED ORDER — FENTANYL CITRATE (PF) 250 MCG/5ML IJ SOLN
INTRAMUSCULAR | Status: DC | PRN
  Administered 2022-06-27 (×4): 25 ug via INTRAVENOUS

## 2022-06-27 MED ORDER — ACETAMINOPHEN 500 MG PO TABS: 1000.0000 mg | ORAL_TABLET | Freq: Four times a day (QID) | ORAL | Status: DC | PRN

## 2022-06-27 MED ORDER — BUPIVACAINE-EPINEPHRINE (PF) 0.5% -1:200000 IJ SOLN
INTRAMUSCULAR | Status: DC | PRN
  Administered 2022-06-27: 20 mL via PERINEURAL

## 2022-06-27 MED ORDER — CEFAZOLIN SODIUM-DEXTROSE 2-4 GM/100ML-% IV SOLN
2.0000 g | Freq: Three times a day (TID) | INTRAVENOUS | Status: AC
  Administered 2022-06-27: 2 g via INTRAVENOUS
  Filled 2022-06-27: qty 100

## 2022-06-27 MED ORDER — ONDANSETRON 4 MG PO TBDP: 4.0000 mg | ORAL_TABLET | Freq: Four times a day (QID) | ORAL | Status: DC | PRN

## 2022-06-27 MED ORDER — SIMVASTATIN 20 MG PO TABS
40.0000 mg | ORAL_TABLET | Freq: Every day | ORAL | Status: DC
  Administered 2022-06-27 – 2022-06-29 (×3): 40 mg via ORAL
  Filled 2022-06-27 (×3): qty 2

## 2022-06-27 MED ORDER — FENTANYL CITRATE (PF) 250 MCG/5ML IJ SOLN
INTRAMUSCULAR | Status: AC
  Filled 2022-06-27: qty 5

## 2022-06-27 MED ORDER — SENNA 8.6 MG PO TABS
1.0000 | ORAL_TABLET | Freq: Two times a day (BID) | ORAL | Status: DC
  Administered 2022-06-27 – 2022-06-30 (×7): 8.6 mg via ORAL
  Filled 2022-06-27 (×7): qty 1

## 2022-06-27 MED ORDER — DIPHENHYDRAMINE HCL 12.5 MG/5ML PO ELIX: 12.5000 mg | ORAL_SOLUTION | Freq: Four times a day (QID) | ORAL | Status: DC | PRN

## 2022-06-27 MED ORDER — SPIRONOLACTONE 12.5 MG HALF TABLET
12.5000 mg | ORAL_TABLET | Freq: Every day | ORAL | Status: DC
  Administered 2022-06-27 – 2022-06-30 (×3): 12.5 mg via ORAL
  Filled 2022-06-27 (×4): qty 1

## 2022-06-27 MED ORDER — KCL IN DEXTROSE-NACL 20-5-0.45 MEQ/L-%-% IV SOLN
INTRAVENOUS | Status: DC
  Filled 2022-06-27 (×4): qty 1000

## 2022-06-27 MED ORDER — BISACODYL 5 MG PO TBEC: 5.0000 mg | DELAYED_RELEASE_TABLET | Freq: Every day | ORAL | Status: DC | PRN

## 2022-06-27 MED ORDER — LIDOCAINE 2% (20 MG/ML) 5 ML SYRINGE
INTRAMUSCULAR | Status: DC | PRN
  Administered 2022-06-27: 60 mg via INTRAVENOUS

## 2022-06-27 MED ORDER — MELOXICAM 7.5 MG PO TABS
7.5000 mg | ORAL_TABLET | Freq: Every day | ORAL | Status: DC
  Administered 2022-06-27 – 2022-06-30 (×4): 7.5 mg via ORAL
  Filled 2022-06-27 (×4): qty 1

## 2022-06-27 MED ORDER — GABAPENTIN 300 MG PO CAPS
300.0000 mg | ORAL_CAPSULE | Freq: Three times a day (TID) | ORAL | Status: DC
  Administered 2022-06-27 – 2022-06-30 (×10): 300 mg via ORAL
  Filled 2022-06-27 (×10): qty 1

## 2022-06-27 MED ORDER — CHLORHEXIDINE GLUCONATE 0.12 % MT SOLN
OROMUCOSAL | Status: AC
  Administered 2022-06-27: 15 mL via OROMUCOSAL
  Filled 2022-06-27: qty 15

## 2022-06-27 MED ORDER — FENTANYL CITRATE (PF) 100 MCG/2ML IJ SOLN: 25.0000 ug | INTRAMUSCULAR | Status: DC | PRN

## 2022-06-27 MED ORDER — BUPIVACAINE LIPOSOME 1.3 % IJ SUSP
INTRAMUSCULAR | Status: DC | PRN
  Administered 2022-06-27: 10 mL via PERINEURAL

## 2022-06-27 MED ORDER — SIMETHICONE 80 MG PO CHEW: 40.0000 mg | CHEWABLE_TABLET | Freq: Four times a day (QID) | ORAL | Status: DC | PRN

## 2022-06-27 MED ORDER — OXYCODONE HCL 5 MG PO TABS
5.0000 mg | ORAL_TABLET | ORAL | Status: DC | PRN
  Administered 2022-06-29 (×2): 5 mg via ORAL
  Filled 2022-06-27 (×3): qty 1

## 2022-06-27 MED ORDER — CHLORHEXIDINE GLUCONATE 0.12 % MT SOLN: 15.0000 mL | Freq: Once | OROMUCOSAL | Status: AC

## 2022-06-27 MED ORDER — DIPHENHYDRAMINE HCL 50 MG/ML IJ SOLN: 12.5000 mg | Freq: Four times a day (QID) | INTRAMUSCULAR | Status: DC | PRN

## 2022-06-27 MED ORDER — ORAL CARE MOUTH RINSE: 15.0000 mL | Freq: Once | OROMUCOSAL | Status: AC

## 2022-06-27 MED ORDER — PROPOFOL 500 MG/50ML IV EMUL
INTRAVENOUS | Status: DC | PRN
  Administered 2022-06-27: 25 ug/kg/min via INTRAVENOUS

## 2022-06-27 MED ORDER — PROPOFOL 10 MG/ML IV BOLUS
INTRAVENOUS | Status: DC | PRN
  Administered 2022-06-27: 100 mg via INTRAVENOUS

## 2022-06-27 SURGICAL SUPPLY — 65 items
ADH SKN CLS APL DERMABOND .7 (GAUZE/BANDAGES/DRESSINGS) ×1
AGENT HMST 10 BLLW SHRT CANN (HEMOSTASIS)
APL PRP STRL LF DISP 70% ISPRP (MISCELLANEOUS) ×1
BAG COUNTER SPONGE SURGICOUNT (BAG) ×1 IMPLANT
BAG SPNG CNTER NS LX DISP (BAG) ×1
BINDER BREAST LRG (GAUZE/BANDAGES/DRESSINGS) IMPLANT
BINDER BREAST XLRG (GAUZE/BANDAGES/DRESSINGS) IMPLANT
BIOPATCH RED 1 DISK 7.0 (GAUZE/BANDAGES/DRESSINGS) ×1 IMPLANT
BNDG CMPR 5X4 CHSV STRCH STRL (GAUZE/BANDAGES/DRESSINGS) ×1
BNDG COHESIVE 4X5 TAN STRL LF (GAUZE/BANDAGES/DRESSINGS) ×1 IMPLANT
CANISTER SUCT 3000ML PPV (MISCELLANEOUS) ×1 IMPLANT
CHLORAPREP W/TINT 26 (MISCELLANEOUS) ×1 IMPLANT
CLIP TI MEDIUM 24 (CLIP) ×1 IMPLANT
CLSR STERI-STRIP ANTIMIC 1/2X4 (GAUZE/BANDAGES/DRESSINGS) IMPLANT
CNTNR URN SCR LID CUP LEK RST (MISCELLANEOUS) IMPLANT
CONT SPEC 4OZ STRL OR WHT (MISCELLANEOUS)
COVER PROBE W GEL 5X96 (DRAPES) ×1 IMPLANT
COVER SURGICAL LIGHT HANDLE (MISCELLANEOUS) ×1 IMPLANT
DERMABOND ADVANCED .7 DNX12 (GAUZE/BANDAGES/DRESSINGS) ×1 IMPLANT
DRAIN CHANNEL 19F RND (DRAIN) ×1 IMPLANT
DRSG TEGADERM 4X4.75 (GAUZE/BANDAGES/DRESSINGS) ×1 IMPLANT
ELECT BLADE 4.0 EZ CLEAN MEGAD (MISCELLANEOUS) ×1
ELECT CAUTERY BLADE 6.4 (BLADE) ×1 IMPLANT
ELECT REM PT RETURN 9FT ADLT (ELECTROSURGICAL) ×1
ELECTRODE BLDE 4.0 EZ CLN MEGD (MISCELLANEOUS) IMPLANT
ELECTRODE REM PT RTRN 9FT ADLT (ELECTROSURGICAL) ×1 IMPLANT
EVACUATOR SILICONE 100CC (DRAIN) ×1 IMPLANT
GAUZE PAD ABD 8X10 STRL (GAUZE/BANDAGES/DRESSINGS) ×1 IMPLANT
GAUZE SPONGE 4X4 12PLY STRL (GAUZE/BANDAGES/DRESSINGS) ×1 IMPLANT
GLOVE BIO SURGEON STRL SZ 6 (GLOVE) ×1 IMPLANT
GLOVE INDICATOR 6.5 STRL GRN (GLOVE) ×1 IMPLANT
GOWN STRL REUS W/ TWL LRG LVL3 (GOWN DISPOSABLE) ×1 IMPLANT
GOWN STRL REUS W/ TWL XL LVL3 (GOWN DISPOSABLE) ×1 IMPLANT
GOWN STRL REUS W/TWL LRG LVL3 (GOWN DISPOSABLE) ×1
GOWN STRL REUS W/TWL XL LVL3 (GOWN DISPOSABLE) ×1
HEMOSTAT HEMOBLAST BELLOWS (HEMOSTASIS) IMPLANT
KIT BASIN OR (CUSTOM PROCEDURE TRAY) ×1 IMPLANT
KIT TURNOVER KIT B (KITS) ×1 IMPLANT
LIGHT WAVEGUIDE WIDE FLAT (MISCELLANEOUS) IMPLANT
MARKER SKIN DUAL TIP RULER LAB (MISCELLANEOUS) ×1 IMPLANT
NDL 18GX1X1/2 (RX/OR ONLY) (NEEDLE) IMPLANT
NDL FILTER BLUNT 18X1 1/2 (NEEDLE) IMPLANT
NDL HYPO 25GX1X1/2 BEV (NEEDLE) IMPLANT
NEEDLE 18GX1X1/2 (RX/OR ONLY) (NEEDLE) IMPLANT
NEEDLE FILTER BLUNT 18X1 1/2 (NEEDLE) ×1 IMPLANT
NEEDLE HYPO 25GX1X1/2 BEV (NEEDLE) IMPLANT
NS IRRIG 1000ML POUR BTL (IV SOLUTION) ×1 IMPLANT
PACK GENERAL/GYN (CUSTOM PROCEDURE TRAY) ×1 IMPLANT
PACK UNIVERSAL I (CUSTOM PROCEDURE TRAY) ×1 IMPLANT
PAD ABD 8X10 STRL (GAUZE/BANDAGES/DRESSINGS) IMPLANT
PAD ARMBOARD 7.5X6 YLW CONV (MISCELLANEOUS) ×1 IMPLANT
SPONGE T-LAP 18X18 ~~LOC~~+RFID (SPONGE) IMPLANT
STOCKINETTE IMPERVIOUS 9X36 MD (GAUZE/BANDAGES/DRESSINGS) ×1 IMPLANT
STRIP CLOSURE SKIN 1/2X4 (GAUZE/BANDAGES/DRESSINGS) ×1 IMPLANT
SUT ETHILON 2 0 FS 18 (SUTURE) ×1 IMPLANT
SUT MNCRL AB 4-0 PS2 18 (SUTURE) IMPLANT
SUT MON AB 4-0 PC3 18 (SUTURE) ×1 IMPLANT
SUT SILK 2 0 PERMA HAND 18 BK (SUTURE) ×1 IMPLANT
SUT SILK 2 0 SH (SUTURE) ×1 IMPLANT
SUT VIC AB 3-0 SH 8-18 (SUTURE) ×1 IMPLANT
SYR 3ML LL SCALE MARK (SYRINGE) IMPLANT
SYR CONTROL 10ML LL (SYRINGE) IMPLANT
TOWEL GREEN STERILE (TOWEL DISPOSABLE) ×1 IMPLANT
TOWEL GREEN STERILE FF (TOWEL DISPOSABLE) ×1 IMPLANT
TRACER MAGTRACE VIAL (MISCELLANEOUS) IMPLANT

## 2022-06-27 NOTE — Progress Notes (Signed)
No orders for consent. Paged Dr. Donell Beers x 2 to request orders. Awaiting return phone call.

## 2022-06-27 NOTE — Anesthesia Postprocedure Evaluation (Signed)
Anesthesia Post Note  Patient: Krithika L Weatherwax  Procedure(s) Performed: RIGHT MASTECTOMY WITH SENTINEL LYMPH NODE BIOPSY (Right: Breast)     Patient location during evaluation: PACU Anesthesia Type: General Level of consciousness: awake Pain management: pain level controlled Vital Signs Assessment: post-procedure vital signs reviewed and stable Respiratory status: spontaneous breathing, nonlabored ventilation and respiratory function stable Cardiovascular status: blood pressure returned to baseline and stable Postop Assessment: no apparent nausea or vomiting Anesthetic complications: no   No notable events documented.  Last Vitals:  Vitals:   06/27/22 1145 06/27/22 1207  BP: (!) 146/85 (!) 143/82  Pulse: 68 71  Resp: 18 16  Temp:  36.4 C  SpO2: 93% 93%    Last Pain:  Vitals:   06/27/22 1207  TempSrc: Oral  PainSc:                  Linton Rump

## 2022-06-27 NOTE — Op Note (Signed)
Right Mastectomy with Sentinel Node Biopsy Procedure Note  Indications: This patient presents with history of right breast cancer   Pre-operative Diagnosis: right breast cancer, cT3N0M0, upper inner quadrant, receptors -/-/-. 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    Post-operative Diagnosis: same  Surgeon: Almond Lint   Assistant:  Saunders Glance, PA-C  Anesthesia: General endotracheal anesthesia and pectoral block  ASA Class: 3  Procedure Details  The patient was seen in the Holding Room. The risks, benefits, complications, treatment options, and expected outcomes were discussed with the patient. The possibilities of reaction to medication, pulmonary aspiration, bleeding, infection, the need for additional procedures, failure to diagnose a condition, and creating a complication requiring transfusion or operation were discussed with the patient. The patient concurred with the proposed plan, giving informed consent.  The site of surgery properly noted/marked. The patient was taken to Operating Room # 1, identified as Jennifer Cowan and the procedure verified as right Mastectomy and Sentinel Node Biopsy. After induction of anesthesia, the right arm, breast, and chest were prepped and draped in standard fashion. A Time Out was held and the above information confirmed.  The magtrace was injected in the subareolar location.     The borders of the breast were identified and marked.  An anchor/wise type incision was drawn out to make sure incision lines were equidistant in length and that skin would lie flat.    The superior incision was made with the #10 blade.  Mastectomy hooks were used to provide elevation of the skin edges, and the cautery was used to create the mastectomy flaps.  The dissection was taken to the fascia of the pectoralis major.  The penetrating vessels were clipped as needed.  The superior flap was taken medially to the lateral sternal border, superiorly to  the inferior border of the clavicle.  The inferior flap was similarly created, inferiorly to the inframammary fold and laterally to the border of the latissimus.  The breast was taken off including the pectoralis fascia and the axillary tail marked.    Using a sentimag probe, axillary sentinel nodes were identified.  Five deep level 2 axillary sentinel nodes were removed and submitted to pathology.  The findings are below.  The lymphovascular channels were clipped with metal clips.        The wound was irrigated.  Hemostasis was achieved with cautery.  One 19 Blake drain was placed laterally and secured with a 2-0 nylon.  The wound was irrigated and closed with a 3-0 Vicryl deep dermal interrupted sutures and 4-0 Vicryl subcuticular closure in layers.    Sterile dressings were applied. At the end of the operation, all sponge, instrument, and needle counts were correct.  Findings: grossly clear surgical margins Nodes: SLN #1 hot-Tc99 cps >900, magtrace cps around 180 SLN #2 hot, T99 cps 250, magtrace 0 SLN #3 palpable SLn #4 palpable SLN #5 hot, cps 90, magtrace 0  Estimated Blood Loss: minimal          Drains: 19 Fr blake drain in right chest wall                Specimens: right breast and five axillary sentinel nodes. Additional superior skin margin.           Complications:  None; patient tolerated the procedure well.         Disposition: PACU - hemodynamically stable.         Condition: stable

## 2022-06-27 NOTE — Interval H&P Note (Signed)
History and Physical Interval Note:  06/27/2022 7:56 AM  Jennifer Cowan  has presented today for surgery, with the diagnosis of RIGHT BREAST CANCER.  The various methods of treatment have been discussed with the patient and family. After consideration of risks, benefits and other options for treatment, the patient has consented to  Procedure(s): RIGHT MASTECTOMY WITH SENTINEL LYMPH NODE BIOPSY (Right) as a surgical intervention.  The patient's history has been reviewed, patient examined, no change in status, stable for surgery.  I have reviewed the patient's chart and labs.  Questions were answered to the patient's satisfaction.     Almond Lint

## 2022-06-27 NOTE — Anesthesia Procedure Notes (Signed)
Anesthesia Regional Block: Pectoralis block   Pre-Anesthetic Checklist: , timeout performed,  Correct Patient, Correct Site, Correct Laterality,  Correct Procedure, Correct Position, site marked,  Risks and benefits discussed,  Surgical consent,  Pre-op evaluation,  At surgeon's request and post-op pain management  Laterality: Right  Prep: Maximum Sterile Barrier Precautions used, chloraprep       Needles:  Injection technique: Single-shot  Needle Type: Echogenic Stimulator Needle     Needle Length: 9cm  Needle Gauge: 21     Additional Needles:   Procedures:,,,, ultrasound used (permanent image in chart),,    Narrative:  Start time: 06/27/2022 7:36 AM End time: 06/27/2022 7:46 AM Injection made incrementally with aspirations every 5 mL.  Performed by: Personally  Anesthesiologist: Gaynelle Adu, MD

## 2022-06-27 NOTE — Anesthesia Procedure Notes (Signed)
Procedure Name: LMA Insertion Date/Time: 06/27/2022 8:15 AM  Performed by: Carlos American, CRNAPre-anesthesia Checklist: Patient identified, Emergency Drugs available, Suction available and Patient being monitored Patient Re-evaluated:Patient Re-evaluated prior to induction Oxygen Delivery Method: Circle System Utilized Preoxygenation: Pre-oxygenation with 100% oxygen Induction Type: IV induction Ventilation: Mask ventilation without difficulty LMA: LMA inserted LMA Size: 4.0 Number of attempts: 1 Placement Confirmation: positive ETCO2 Tube secured with: Tape Dental Injury: Teeth and Oropharynx as per pre-operative assessment

## 2022-06-27 NOTE — Discharge Instructions (Signed)
Hold benazapril until Monday June 17.    [facility] [phone]  MASTECTOMY: POST OP INSTRUCTIONS  Always review your discharge instruction sheet given to you by the facility where your surgery was performed. IF YOU HAVE DISABILITY OR FAMILY LEAVE FORMS, YOU MUST BRING THEM TO THE OFFICE FOR PROCESSING.   DO NOT GIVE THEM TO YOUR DOCTOR. A prescription for pain medication may be given to you upon discharge.  Take your pain medication as prescribed, if needed.  If narcotic pain medicine is not needed, then you may take acetaminophen (Tylenol) or ibuprofen (Advil) as needed. Take your usually prescribed medications unless otherwise directed. If you need a refill on your pain medication, please contact your pharmacy.  They will contact our office to request authorization.  Prescriptions will not be filled after 5pm or on week-ends. You should follow a light diet the first few days after arrival home, such as soup and crackers, etc.  Resume your normal diet the day after surgery. Most patients will experience some swelling and bruising on the chest and underarm.  Ice packs will help.  Swelling and bruising can take several days to resolve.  It is common to experience some constipation if taking pain medication after surgery.  Increasing fluid intake and taking a stool softener (such as Colace) will usually help or prevent this problem from occurring.  A mild laxative (Milk of Magnesia or Miralax) should be taken according to package instructions if there are no bowel movements after 48 hours. Unless discharge instructions indicate otherwise, leave your bandage dry and in place until your next appointment in 3-5 days.  You may take a limited sponge bath.  No tube baths or showers until the drains are removed.  You may have steri-strips (small skin tapes) in place directly over the incision.  These strips should be left on the skin for 7-10 days.  If your surgeon used skin glue on  the incision, you may shower in 24 hours.  The glue will flake off over the next 2-3 weeks.  Any sutures or staples will be removed at the office during your follow-up visit. DRAINS:  If you have drains in place, it is important to keep a list of the amount of drainage produced each day in your drains.  Before leaving the hospital, you should be instructed on drain care.  Call our office if you have any questions about your drains. ACTIVITIES:  You may resume regular (light) daily activities beginning the next day--such as daily self-care, walking, climbing stairs--gradually increasing activities as tolerated.  You may have sexual intercourse when it is comfortable.  Refrain from any heavy lifting or straining until approved by your doctor. You may drive when you are no longer taking prescription pain medication, you can comfortably wear a seatbelt, and you can safely maneuver your car and apply brakes. RETURN TO WORK:  __________________________________________________________ Bonita Quin should see your doctor in the office for a follow-up appointment approximately 3-5 days after your surgery.  Your doctor's nurse will typically make your follow-up appointment when she calls you with your pathology report.  Expect your pathology report 2-3 business days after your surgery.  You may call to check if you do not hear from [name] after three days.   OTHER INSTRUCTIONS: ______________________________________________________________________________________________ ____________________________________________________________________________________________ WHEN TO CALL YOUR DOCTOR: Fever over 101.0 Nausea and/or vomiting Extreme swelling or bruising Continued bleeding from incision. Increased pain, redness, or drainage from the incision. The clinic staff is available to answer your  questions during regular business hours.  Please don't hesitate to call and ask to speak to one of the nurses for clinical concerns.  If you  have a medical emergency, go to the nearest emergency room or call 911.  A surgeon from [facility] is always on call at the hospital. [address], [place], [place]  [address] ? [address] [address], [place], [place]   [address] [phone] ? [phone] ? FAX [phone] Web site: [email or web address]

## 2022-06-27 NOTE — Transfer of Care (Signed)
Immediate Anesthesia Transfer of Care Note  Patient: Jennifer Cowan  Procedure(s) Performed: RIGHT MASTECTOMY WITH SENTINEL LYMPH NODE BIOPSY (Right: Breast)  Patient Location: PACU  Anesthesia Type:GA combined with regional for post-op pain  Level of Consciousness: drowsy and patient cooperative  Airway & Oxygen Therapy: Patient Spontanous Breathing  Post-op Assessment: Report given to RN and Post -op Vital signs reviewed and stable  Post vital signs: Reviewed and stable  Last Vitals:  Vitals Value Taken Time  BP 147/76 06/27/22 1056  Temp    Pulse 67 06/27/22 1058  Resp 14 06/27/22 1058  SpO2 93 % 06/27/22 1058  Vitals shown include unvalidated device data.  Last Pain:  Vitals:   06/27/22 0621  TempSrc:   PainSc: 0-No pain      Patients Stated Pain Goal: 0 (06/27/22 1610)  Complications: No notable events documented.

## 2022-06-28 ENCOUNTER — Ambulatory Visit: Payer: 59 | Admitting: Internal Medicine

## 2022-06-28 ENCOUNTER — Encounter (HOSPITAL_COMMUNITY): Payer: Self-pay | Admitting: General Surgery

## 2022-06-28 DIAGNOSIS — E785 Hyperlipidemia, unspecified: Secondary | ICD-10-CM | POA: Diagnosis present

## 2022-06-28 DIAGNOSIS — M1990 Unspecified osteoarthritis, unspecified site: Secondary | ICD-10-CM | POA: Diagnosis present

## 2022-06-28 DIAGNOSIS — C3492 Malignant neoplasm of unspecified part of left bronchus or lung: Secondary | ICD-10-CM | POA: Diagnosis present

## 2022-06-28 DIAGNOSIS — Z171 Estrogen receptor negative status [ER-]: Secondary | ICD-10-CM | POA: Diagnosis not present

## 2022-06-28 DIAGNOSIS — Z9221 Personal history of antineoplastic chemotherapy: Secondary | ICD-10-CM | POA: Diagnosis not present

## 2022-06-28 DIAGNOSIS — C50211 Malignant neoplasm of upper-inner quadrant of right female breast: Secondary | ICD-10-CM | POA: Diagnosis present

## 2022-06-28 DIAGNOSIS — E119 Type 2 diabetes mellitus without complications: Secondary | ICD-10-CM | POA: Diagnosis present

## 2022-06-28 DIAGNOSIS — R569 Unspecified convulsions: Secondary | ICD-10-CM | POA: Diagnosis present

## 2022-06-28 DIAGNOSIS — Z791 Long term (current) use of non-steroidal anti-inflammatories (NSAID): Secondary | ICD-10-CM | POA: Diagnosis not present

## 2022-06-28 DIAGNOSIS — Z79899 Other long term (current) drug therapy: Secondary | ICD-10-CM | POA: Diagnosis not present

## 2022-06-28 DIAGNOSIS — C7931 Secondary malignant neoplasm of brain: Secondary | ICD-10-CM | POA: Diagnosis present

## 2022-06-28 DIAGNOSIS — E669 Obesity, unspecified: Secondary | ICD-10-CM | POA: Diagnosis present

## 2022-06-28 DIAGNOSIS — M7989 Other specified soft tissue disorders: Secondary | ICD-10-CM | POA: Diagnosis not present

## 2022-06-28 DIAGNOSIS — M8580 Other specified disorders of bone density and structure, unspecified site: Secondary | ICD-10-CM | POA: Diagnosis present

## 2022-06-28 DIAGNOSIS — I10 Essential (primary) hypertension: Secondary | ICD-10-CM | POA: Diagnosis present

## 2022-06-28 DIAGNOSIS — Z9012 Acquired absence of left breast and nipple: Secondary | ICD-10-CM | POA: Diagnosis not present

## 2022-06-28 LAB — CBC
HCT: 38.8 % (ref 36.0–46.0)
HCT: 42.9 % (ref 36.0–46.0)
Hemoglobin: 13.5 g/dL (ref 12.0–15.0)
Hemoglobin: 14.2 g/dL (ref 12.0–15.0)
MCH: 30.8 pg (ref 26.0–34.0)
MCH: 31.4 pg (ref 26.0–34.0)
MCHC: 33.1 g/dL (ref 30.0–36.0)
MCHC: 34.8 g/dL (ref 30.0–36.0)
MCV: 90.2 fL (ref 80.0–100.0)
MCV: 93.1 fL (ref 80.0–100.0)
Platelets: 162 10*3/uL (ref 150–400)
Platelets: 246 10*3/uL (ref 150–400)
RBC: 4.3 MIL/uL (ref 3.87–5.11)
RBC: 4.61 MIL/uL (ref 3.87–5.11)
RDW: 15.6 % — ABNORMAL HIGH (ref 11.5–15.5)
RDW: 15.6 % — ABNORMAL HIGH (ref 11.5–15.5)
WBC: 16.8 10*3/uL — ABNORMAL HIGH (ref 4.0–10.5)
WBC: 17.4 10*3/uL — ABNORMAL HIGH (ref 4.0–10.5)
nRBC: 0 % (ref 0.0–0.2)
nRBC: 0 % (ref 0.0–0.2)

## 2022-06-28 LAB — BASIC METABOLIC PANEL
Anion gap: 5 (ref 5–15)
BUN: 17 mg/dL (ref 8–23)
CO2: 24 mmol/L (ref 22–32)
Calcium: 8.6 mg/dL — ABNORMAL LOW (ref 8.9–10.3)
Chloride: 102 mmol/L (ref 98–111)
Creatinine, Ser: 0.76 mg/dL (ref 0.44–1.00)
GFR, Estimated: >60 mL/min (ref >60)
Glucose, Bld: 124 mg/dL — ABNORMAL HIGH (ref 70–99)
Potassium: 5 mmol/L (ref 3.5–5.1)
Sodium: 131 mmol/L — ABNORMAL LOW (ref 135–145)

## 2022-06-28 MED ORDER — DEXAMETHASONE 4 MG PO TABS
4.0000 mg | ORAL_TABLET | Freq: Once | ORAL | Status: AC
  Administered 2022-06-28: 4 mg via ORAL
  Filled 2022-06-28: qty 1

## 2022-06-28 NOTE — Progress Notes (Signed)
Pt called for bathroom. NT went to assist pt when she suddenly got dizzy and NT had to assist pt to the floor. NT called for help. Able to assist pt to the bed. VS as follows BP= 92/54 with MAP of 67, HR 76, RR16, O2 96% on RA.This RN noticed significant increased on JP drain output and continuesly draining. No lump or drainage noted on incision site at this time. CBC was just resulted with pt's hgb at 14.2  Made Dr. Janee Morn aware. Restart IV fluid and repeat CBC at 0600 has been ordered. Pt on bed stated she feels better. Call bell within reach and instructed pt to call before getting up. Bed on lowest position and alarm is on.

## 2022-06-28 NOTE — Progress Notes (Signed)
1 Day Post-Op   Subjective/Chief Complaint: Pt very dizzy overnight.  HCT was checked and was stable.  BP meds held this AM.  Denies pain.    Objective: Vital signs in last 24 hours: Temp:  [97.8 F (36.6 C)-98.2 F (36.8 C)] 98.2 F (36.8 C) (06/11 1159) Pulse Rate:  [70-97] 97 (06/11 1159) Resp:  [15-19] 16 (06/11 1159) BP: (90-116)/(45-57) 90/45 (06/11 1159) SpO2:  [95 %-98 %] 95 % (06/11 1159)    Intake/Output from previous day: 06/10 0701 - 06/11 0700 In: 1760.3 [P.O.:240; I.V.:1420.3; IV Piggyback:100] Out: 585 [Drains:535; Blood:50] Intake/Output this shift: Total I/O In: 120 [P.O.:120] Out: 45 [Drains:45]  General appearance: alert, cooperative, and mild distress Resp: breathing comfortably. Chest wall: anticipated right sided tenderness.  Drain serosang Extremities: extremities normal, atraumatic, no cyanosis or edema  Lab Results:  Recent Labs    06/28/22 0019 06/28/22 0728  WBC 17.4* 16.8*  HGB 14.2 13.5  HCT 42.9 38.8  PLT 246 162   BMET Recent Labs    06/28/22 0019  NA 131*  K 5.0  CL 102  CO2 24  GLUCOSE 124*  BUN 17  CREATININE 0.76  CALCIUM 8.6*   PT/INR No results for input(s): "LABPROT", "INR" in the last 72 hours. ABG No results for input(s): "PHART", "HCO3" in the last 72 hours.  Invalid input(s): "PCO2", "PO2"  Studies/Results: NM Sentinel Node Inj-No Rpt (Breast)  Result Date: 06/27/2022 Sulfur Colloid was injected by the Nuclear Medicine Technologist for sentinel lymph node localization.    Anti-infectives: Anti-infectives (From admission, onward)    Start     Dose/Rate Route Frequency Ordered Stop   06/27/22 1600  ceFAZolin (ANCEF) IVPB 2g/100 mL premix        2 g 200 mL/hr over 30 Minutes Intravenous Every 8 hours 06/27/22 1158 06/27/22 1712   06/27/22 0730  ceFAZolin (ANCEF) IVPB 2g/100 mL premix        2 g 200 mL/hr over 30 Minutes Intravenous  Once 06/27/22 0725 06/27/22 0817       Assessment/Plan: s/p  Procedure(s): RIGHT MASTECTOMY WITH SENTINEL LYMPH NODE BIOPSY (Right) for right breast cancer  Keep overnight again due to dizziness. I gave an extra 4 mg decadron in order to see if stress dose steroids would be beneficial and this seems to have helped.   She was a bit unsteady getting up to bedside commode.  PT consult to assess stability. No evidence of any significant hematoma on chest wall.       LOS: 0 days    Almond Lint 06/28/2022

## 2022-06-28 NOTE — Progress Notes (Signed)
Patient ID: Jennifer Cowan, female   DOB: 01-04-1946, 77 y.o.   MRN: [id number] Patient S/P R mastectomy, SLN Bx. Notified by RN of increased output from JP. On exam, no significant hematoma. I placed 2 ace wraps to get some better compression. Breast binder replaced. Hb at 0019 was 14.2, Will monitor and repeat CBC later this AM.  Violeta Gelinas, MD, MPH, FACS Please use [email or web address] to contact on call provider

## 2022-06-28 NOTE — TOC Initial Note (Signed)
Transition of Care (TOC) - Initial/Assessment Note   Spoke to patient at bedside.   Confirmed face sheet information.   Await PT evaluation   Patient from home with sister . Has transportation to appointments . Sister will provide transportation home at discharge.   Patient has wheel chair, cane and walker at home.   Patient had home health with [facility] in the past ( last summer).  Patient Details  Name: Jennifer Cowan MRN: [id number] Date of Birth: July 12, 1945  Transition of Care [facility]) CM/SW Contact:    Kingsley Plan, RN Phone Number: 06/28/2022, 3:40 PM  Clinical Narrative:                   Expected Discharge Plan:  (await PT eval)     Patient Goals and CMS Choice Patient states their goals for this hospitalization and ongoing recovery are:: to return to home          Expected Discharge Plan and Services   Discharge Planning Services: CM Consult   Living arrangements for the past 2 months: Single Family Home                 DME Arranged:  (Await PT eval)         HH Arranged:  (await PT eval)          Prior Living Arrangements/Services Living arrangements for the past 2 months: Single Family Home Lives with:: Siblings Patient language and need for interpreter reviewed:: Yes Do you feel safe going back to the place where you live?: Yes      Need for Family Participation in Patient Care: Yes (Comment) Care giver support system in place?: Yes (comment) Current home services: DME (walker cane and wheel chair) Criminal Activity/Legal Involvement Pertinent to Current Situation/Hospitalization: No - Comment as needed  Activities of Daily Living Home Assistive Devices/Equipment: Wheelchair, Environmental consultant (specify type), Eyeglasses, Dentures (specify type) (Upper Dentures) ADL Screening (condition at time of admission) Patient's cognitive ability adequate to safely complete daily activities?: Yes Is the patient deaf or have difficulty hearing?: No Does the  patient have difficulty seeing, even when wearing glasses/contacts?: No Does the patient have difficulty concentrating, remembering, or making decisions?: Yes Patient able to express need for assistance with ADLs?: Yes Does the patient have difficulty dressing or bathing?: No Independently performs ADLs?: Yes (appropriate for developmental age) Does the patient have difficulty walking or climbing stairs?: No Weakness of Legs: None Weakness of Arms/Hands: None  Permission Sought/Granted   Permission granted to share information with : No              Emotional Assessment Appearance:: Appears stated age Attitude/Demeanor/Rapport: Engaged Affect (typically observed): Accepting Orientation: : Oriented to Self, Oriented to Place, Oriented to  Time, Oriented to Situation Alcohol / Substance Use: Not Applicable Psych Involvement: No (comment)  Admission diagnosis:  Breast cancer of upper-inner quadrant of right female breast [facility]) [C50.211] Patient Active Problem List   Diagnosis Date Noted   Breast cancer of upper-inner quadrant of right female breast (HCC) 06/27/2022   Genetic testing 02/28/2022   Family history of breast cancer 02/17/2022   Stroke (cerebrum) (HCC) 12/16/2021   Type 2 diabetes mellitus with hyperglycemia, without long-term current use of insulin (HCC) 10/06/2021   Port-A-Cath in place 08/30/2021   Hyperglycemia 08/30/2021   Malignant neoplasm of unspecified part of unspecified bronchus or lung (HCC) 08/13/2021   Physical deconditioning 07/27/2021   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   Mass of  upper lobe of left lung 07/25/2021   Metastasis to brain [facility]) 07/24/2021   Overweight 07/07/2021   Osteopenia 01/12/2021   History of breast cancer 03/05/2020   Hypertension associated with type 2 diabetes mellitus (HCC) 06/26/2018   Iron deficiency anemia 06/26/2018   Hyperlipidemia associated with type 2 diabetes mellitus (HCC) 06/26/2018   Primary  osteoarthritis involving multiple joints 06/26/2018   PCP:  [name], FNP Pharmacy:   [facility] [address], [place] - [address] [place] [address] Phone: [phone] Fax: [phone]  [facility] - [place], [place] - [address] [place] [address] Phone: [phone] Fax: [phone]  [facility] [address] [place] [address] Phone: [phone] Fax: [phone]     Social Determinants of Health (SDOH) Social History: SDOH Screenings   Food Insecurity: No Food Insecurity (06/27/2022)  Housing: Low Risk  (06/27/2022)  Transportation Needs: No Transportation Needs (06/27/2022)  Utilities: Not At Risk (06/27/2022)  Depression (PHQ2-9): Low Risk  (04/08/2022)  Tobacco Use: Low Risk  (06/28/2022)   SDOH Interventions:     Readmission Risk Interventions     No data to display

## 2022-06-29 MED ORDER — OXYCODONE HCL 5 MG PO TABS: 5.0000 mg | ORAL_TABLET | ORAL | 0 refills | Status: DC | PRN

## 2022-06-29 MED ORDER — AMLODIPINE BESYLATE 5 MG PO TABS
5.0000 mg | ORAL_TABLET | Freq: Every day | ORAL | Status: DC
  Administered 2022-06-30: 5 mg via ORAL
  Filled 2022-06-29: qty 1

## 2022-06-29 MED ORDER — DEXAMETHASONE 4 MG PO TABS
4.0000 mg | ORAL_TABLET | Freq: Once | ORAL | Status: AC
  Administered 2022-06-29: 4 mg via ORAL
  Filled 2022-06-29: qty 1

## 2022-06-29 MED ORDER — TRAMADOL HCL 50 MG PO TABS: 50.0000 mg | ORAL_TABLET | Freq: Four times a day (QID) | ORAL | 0 refills | Status: DC | PRN

## 2022-06-29 NOTE — Progress Notes (Signed)
2 Days Post-Op   Subjective/Chief Complaint: Dizziness improved overnight.  Denied pain this AM. Worked with PT.     Objective: Vital signs in last 24 hours: Temp:  [97.3 F (36.3 C)-98 F (36.7 C)] 98 F (36.7 C) (06/12 0750) Pulse Rate:  [87-107] 87 (06/12 0750) Resp:  [16-21] 16 (06/12 0750) BP: (98-131)/(46-68) 98/46 (06/12 1422) SpO2:  [96 %-99 %] 97 % (06/12 0750) Last BM Date :  (PTA)  Intake/Output from previous day: 06/11 0701 - 06/12 0700 In: 120 [P.O.:120] Out: 195 [Drains:195] Intake/Output this shift: Total I/O In: -  Out: 20 [Drains:20]  General appearance: alert, cooperative, and mild distress Resp: breathing comfortably. Chest wall: anticipated right sided tenderness.  Drain serosang Extremities: extremities normal, atraumatic, no cyanosis or edema  Lab Results:  Recent Labs    06/28/22 0019 06/28/22 0728  WBC 17.4* 16.8*  HGB 14.2 13.5  HCT 42.9 38.8  PLT 246 162   BMET Recent Labs    06/28/22 0019  NA 131*  K 5.0  CL 102  CO2 24  GLUCOSE 124*  BUN 17  CREATININE 0.76  CALCIUM 8.6*   PT/INR No results for input(s): "LABPROT", "INR" in the last 72 hours. ABG No results for input(s): "PHART", "HCO3" in the last 72 hours.  Invalid input(s): "PCO2", "PO2"  Studies/Results: No results found.  Anti-infectives: Anti-infectives (From admission, onward)    Start     Dose/Rate Route Frequency Ordered Stop   06/27/22 1600  ceFAZolin (ANCEF) IVPB 2g/100 mL premix        2 g 200 mL/hr over 30 Minutes Intravenous Every 8 hours 06/27/22 1158 06/27/22 1712   06/27/22 0730  ceFAZolin (ANCEF) IVPB 2g/100 mL premix        2 g 200 mL/hr over 30 Minutes Intravenous  Once 06/27/22 0725 06/27/22 0817       Assessment/Plan: s/p Procedure(s): RIGHT MASTECTOMY WITH SENTINEL LYMPH NODE BIOPSY (Right) for right breast cancer  PT consult to assess stability demonstrated need for assistive devices.   No evidence of any significant hematoma on  chest wall.   Post PT, bp is down a bit again.  Took her BP meds this AM.  Trying oxycodone for pain with repeat BP an hour after.   Also, Rn noted LEFT arm swelling.  Getting duplex. Suspect this won't get done today.  If so, will stay until tomorrow.  I am going to hold one of her meds    LOS: 1 day    Almond Lint 06/29/2022

## 2022-06-29 NOTE — Evaluation (Signed)
Physical Therapy Evaluation Patient Details Name: Jennifer Cowan MRN: [id number] DOB: 10-19-1945 Today's Date: 06/29/2022  History of Present Illness  Admitted for surgical treatment of R breast mass in setting of know lung Ca and brain lesions; s/p R mastectomy on 6/10;  has a past medical history of Anemia, Arthritis, Breast cancer (HCC), Cancer (HCC) (1999), Family history of breast cancer, Hyperlipidemia, Hypertension, Osteopenia (01/12/2021), and Personal history of malignant neoplasm of breast.  Clinical Impression   Pt admitted with above diagnosis. Lives at home with sister, in a single-level home with 1 steps to enter; Prior to admission, pt was able to walk without an assistive device (per pt); she does have DME, and underwent PT acutely and in home care setting after admission last summer with functional decline; Presents to PT with generalized weakness, incr fall risk, and postop pain limiting activity tolerance; Moves slowly, but only needing min assist for supine to sit, sit to stand, and bed to [facility] transfers; will plan for chair follow for more porgressive amb next session;  Pt currently with functional limitations due to the deficits listed below (see PT Problem List). Pt will benefit from skilled PT to increase their independence and safety with mobility to allow discharge to the venue listed below.          Recommendations for follow up therapy are one component of a multi-disciplinary discharge planning process, led by the attending physician.  Recommendations may be updated based on patient status, additional functional criteria and insurance authorization.  Follow Up Recommendations       Assistance Recommended at Discharge Frequent or constant Supervision/Assistance  Patient can return home with the following  A lot of help with walking and/or transfers;Assistance with cooking/housework;Assist for transportation    Equipment Recommendations Rollator (4  wheels);BSC/3in1;Wheelchair (measurements PT) (RW, 3in1, wheelchair -- believe pt already has, but will need to be verified)  Recommendations for Other Services  OT consult (Will order per protocol)    Functional Status Assessment Patient has had a recent decline in their functional status and demonstrates the ability to make significant improvements in function in a reasonable and predictable amount of time.     Precautions / Restrictions Precautions Precautions: Fall Precaution Comments: Had to be assisted to the floor by staff while walking to the bathroom durin gthis admission Restrictions Weight Bearing Restrictions: No      Mobility  Bed Mobility Overal bed mobility: Needs Assistance Bed Mobility: Supine to Sit     Supine to sit: Min assist, HOB elevated     General bed mobility comments: Min handheld assist to pull to sit    Transfers Overall transfer level: Needs assistance Equipment used: Rolling walker (2 wheels) Transfers: Sit to/from Stand, Bed to chair/wheelchair/BSC Sit to Stand: Min assist   Step pivot transfers: Min assist       General transfer comment: Min assist to steady    Ambulation/Gait               General Gait Details: Recommend chair follow for initial progressive ambulation  Stairs            Wheelchair Mobility    Modified Rankin (Stroke Patients Only)       Balance Overall balance assessment: Needs assistance Sitting-balance support: Feet supported Sitting balance-Leahy Scale: Fair Sitting balance - Comments: painful with weight shift     Standing balance-Leahy Scale: Fair Standing balance comment: Used RW, but not necessarily dependent on UE support for standing BP  Pertinent Vitals/Pain Pain Assessment Pain Assessment: 0-10 Pain Score: 8  Pain Location: R upper back Pain Descriptors / Indicators: Grimacing, Guarding, Crying Pain Intervention(s): Repositioned, RN gave  pain meds during session    Home Living Family/patient expects to be discharged to:: Private residence Living Arrangements: Other relatives Available Help at Discharge: Family;Available 24 hours/day Type of Home: House Home Access: Stairs to enter   [employer] of Steps: 1   Home Layout: One level Home Equipment: Agricultural consultant (2 wheels);Wheelchair - manual Additional Comments: Pt painful and not talking a lot during PT eval; some info gleaned from chart review    Prior Function Prior Level of Function : Independent/Modified Independent;Needs assist             Mobility Comments: Functional decline since summer of last year; Reports typically does not need AD for mobility, and when she feels up to it, she enjoys walking in [facility] ADLs Comments: Typically independent with basic ADLs, Sister provides assist does not drive     Hand Dominance   Dominant Hand: Right    Extremity/Trunk Assessment   Upper Extremity Assessment Upper Extremity Assessment: Generalized weakness    Lower Extremity Assessment Lower Extremity Assessment: Generalized weakness    Cervical / Trunk Assessment Cervical / Trunk Assessment: Other exceptions Cervical / Trunk Exceptions: R mastectomy, with pain radiating to R upper back  Communication   Communication: No difficulties;Other (comment) (slow to answer at times)  Cognition Arousal/Alertness: Awake/alert Behavior During Therapy: WFL for tasks assessed/performed, Flat affect Overall Cognitive Status: No family/caregiver present to determine baseline cognitive functioning                                 General Comments: Slow to respond to questions; Slow movement of UEs, but able to hold the phone up and place breakfast order once this PT dialed number, and cued pt to wait for food service host        General Comments General comments (skin integrity, edema, etc.): Orthostatics neg for significant drop  between supine and standing zero minutes (see vitals flow sheets); Unable to get a 3 minutes standing BP as pt needed to void    Exercises     Assessment/Plan    PT Assessment Patient needs continued PT services  PT Problem List Decreased strength;Decreased activity tolerance;Decreased balance;Decreased mobility;Decreased coordination;Decreased cognition;Decreased knowledge of use of DME;Decreased safety awareness;Decreased knowledge of precautions;Pain       PT Treatment Interventions DME instruction;Gait training;Functional mobility training;Stair training;Therapeutic activities;Therapeutic exercise;Balance training;Cognitive remediation;Patient/family education    PT Goals (Current goals can be found in the Care Plan section)  Acute Rehab PT Goals Patient Stated Goal: Wants to get back to her normal PT Goal Formulation: With patient Time For Goal Achievement: 07/13/22 Potential to Achieve Goals: Good    Frequency Min 3X/week     Co-evaluation               AM-PAC PT "6 Clicks" Mobility  Outcome Measure Help needed turning from your back to your side while in a flat bed without using bedrails?: None Help needed moving from lying on your back to sitting on the side of a flat bed without using bedrails?: A Little Help needed moving to and from a bed to a chair (including a wheelchair)?: A Little Help needed standing up from a chair using your arms (e.g., wheelchair or bedside chair)?: A Little Help needed to  walk in hospital room?: A Little Help needed climbing 3-5 steps with a railing? : A Lot 6 Click Score: 18    End of Session   Activity Tolerance: Patient limited by pain Patient left: in chair;with nursing/sitter in room (On [facility]) Nurse Communication: Mobility status (orthostatic vitals) PT Visit Diagnosis: Unsteadiness on feet (R26.81);Muscle weakness (generalized) (M62.81);Difficulty in walking, not elsewhere classified (R26.2)    Time: 1610-9604 PT Time  Calculation (min) (ACUTE ONLY): 20 min   Charges:   PT Evaluation $PT Eval Low Complexity: 1 Low          Van Clines, PT  Acute Rehabilitation Services Office [phone] Secure Chat welcomed   Levi Aland 06/29/2022, 10:45 AM

## 2022-06-30 ENCOUNTER — Inpatient Hospital Stay: Payer: 59 | Admitting: Internal Medicine

## 2022-06-30 ENCOUNTER — Inpatient Hospital Stay (HOSPITAL_COMMUNITY): Payer: 59

## 2022-06-30 DIAGNOSIS — M7989 Other specified soft tissue disorders: Secondary | ICD-10-CM

## 2022-06-30 LAB — SURGICAL PATHOLOGY

## 2022-06-30 MED ORDER — ASPIRIN 325 MG PO TABS: 325.0000 mg | ORAL_TABLET | Freq: Every day | ORAL | 0 refills | Status: DC

## 2022-06-30 NOTE — Progress Notes (Signed)
Mobility Specialist Progress Note   06/30/22 1347  Mobility  Activity Ambulated with assistance in room;Transferred to/from [facility];Transferred from bed to chair  Level of Assistance Contact guard assist, steadying assist  Assistive Device Other (Comment) (HHA)  Distance Ambulated (ft) 5 ft  Range of Motion/Exercises Active;All extremities  Activity Response Tolerated fair   Patient received in supine. Presented slightly lethargic and was tearful throughout session. Requested assistance to [facility] and agreed to sit in recliner after. Completed bed mobility with mod I needing extra time and cues for sequencing. Stood impulsively with HHA and took steps to [facility]. Was able to perform pericare independently, then took steps backward to recliner chair with min A. Was left in recliner with all needs met, call bell in reach. Daughter present throughout and assisted with cueing patient.   Jennifer Cowan, BS EXP Mobility Specialist Please contact via SecureChat or Rehab office at [phone]

## 2022-06-30 NOTE — TOC Progression Note (Addendum)
Transition of Care (TOC) - Progression Note   Spoke with patient and family at bedside regarding HHPT. Patient had HH services through [place] last summer and would like them back .   Cindie with [facility] accepted referral for HHPT , OT secure chatted NCM saying patient requesting 3 in 1 , same ordered and texted MD to sign.   Ordered with Lelon Mast with [employer]  Patient Details  Name: Jennifer Cowan MRN: [id number] Date of Birth: Nov 18, 1945  Transition of [facility]) CM/SW Contact  Leiliana Foody, Adria Devon, RN Phone Number: 06/30/2022, 11:01 AM  Clinical Narrative:       Expected Discharge Plan:  (await PT eval)    Expected Discharge Plan and Services   Discharge Planning Services: CM Consult   Living arrangements for the past 2 months: Single Family Home                 DME Arranged:  (Await PT eval)         HH Arranged:  (await PT eval)           Social Determinants of Health (SDOH) Interventions SDOH Screenings   Food Insecurity: No Food Insecurity (06/27/2022)  Housing: Low Risk  (06/27/2022)  Transportation Needs: No Transportation Needs (06/27/2022)  Utilities: Not At Risk (06/27/2022)  Depression (PHQ2-9): Low Risk  (04/08/2022)  Tobacco Use: Low Risk  (06/28/2022)    Readmission Risk Interventions     No data to display

## 2022-06-30 NOTE — Discharge Summary (Signed)
Physician Discharge Summary  Patient ID: Jennifer Cowan MRN: [id number] DOB/AGE: [id number] y.o.  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  She had some marginal blood pressures and higher than average drain output overnight the first night.  CBC was stable.  She received some additional IV fluid and her antihypertensives were held on POD 1.  She was dizzy.  BP was improved with fluid and PT was consulted for home safety.  They were not able to see her that day.  The next AM PT evaluated and they did feel she would benefit from home health PT.  She developed left arm swelling as well.  Left venous duplex was ordered.  This was negative.  Despite no significant drop in HCT, her BP remained in the 90s a good portion of the time.  Benzapril was planned to be held for several days.    Consults:  PT  Significant Diagnostic Studies: labs: HCT 38.8 prior to d/c.    Treatments: IV hydration and surgery: see above  Discharge Exam: Blood pressure 116/60, pulse 97, temperature 98 F (36.7 C), temperature source Oral, resp. rate 16, height 5\' 8"  (1.727 m), weight 75.8 kg, SpO2 97 %. General appearance: alert, cooperative, and no distress Resp: breathing comfortably Chest wall: right sided chest wall tenderness Drain serosang.   Disposition: Discharge disposition: 01-Home or Self  Care       Discharge Instructions     Call MD for:  difficulty breathing, headache or visual disturbances   Complete by: As directed    Call MD for:  hives   Complete by: As directed    Call MD for:  persistant nausea and vomiting   Complete by: As directed    Call MD for:  redness, tenderness, or signs of infection (pain, swelling, redness, odor or green/yellow discharge around incision site)   Complete by: As directed    Call MD for:  severe uncontrolled pain   Complete by: As directed    Call MD for:  temperature >100.4   Complete by: As directed    Change dressing (specify)   Complete by: As directed    Measure and record drain output twice daily Bring record to clinic.   Diet - low sodium heart healthy   Complete by: As directed    Increase activity slowly   Complete by: As directed       Allergies as of 06/30/2022   No Known Allergies      Medication List     TAKE these medications    Accu-Chek Guide test strip Generic drug: glucose blood Use as directed 4 times a day   Accu-Chek Guide w/Device Kit Use as directed 4 times a day   Accu-Chek Softclix Lancets lancets Use to check blood sugar as directed 4 times a day   acetaminophen 500 MG tablet Commonly known as: TYLENOL Take 1,000 mg by mouth every 6 (six) hours as needed for moderate pain.  amLODipine 10 MG tablet Commonly known as: NORVASC TAKE 1 TABLET BY MOUTH DAILY   aspirin 325 MG tablet Commonly known as: EQ Aspirin Take 1 tablet (325 mg total) by mouth daily. What changed: additional instructions   atorvastatin 80 MG tablet Commonly known as: LIPITOR Take 1 tablet (80 mg total) by mouth daily.   benazepril 10 MG tablet Commonly known as: LOTENSIN TAKE 1 TABLET BY MOUTH DAILY Notes to patient: Do not resume until Monday 6/17.    blood glucose meter kit and supplies Kit Dispense based on patient and insurance preference. Use up to four times daily as directed.   dexamethasone 1  MG tablet Commonly known as: DECADRON Take 3 tablets (3 mg total) by mouth daily with breakfast for 7 days, THEN 2 tablets (2 mg total) daily with breakfast for 7 days, THEN 1 tablet (1 mg total) daily with breakfast for 7 days. Start taking on: June 30, 2022 What changed: Another medication with the same name was removed. Continue taking this medication, and follow the directions you see here.   ferrous sulfate 325 (65 FE) MG tablet Take 325 mg by mouth daily with breakfast.   gabapentin 100 MG capsule Commonly known as: NEURONTIN Take 3 capsules (300 mg total) by mouth 2 (two) times daily. What changed: when to take this   ketoconazole 2 % cream Commonly known as: NIZORAL Apply 1 Application topically 2 (two) times daily.   levETIRAcetam 500 MG tablet Commonly known as: Keppra Take 1 tablet (500 mg total) by mouth 2 (two) times daily.   lidocaine-prilocaine cream Commonly known as: EMLA Apply 1 Application topically as needed [facility] access).   meloxicam 7.5 MG tablet Commonly known as: MOBIC TAKE 1 TABLET BY MOUTH DAILY   oxyCODONE 5 MG immediate release tablet Commonly known as: Oxy IR/ROXICODONE Take 1-2 tablets (5-10 mg total) by mouth every 4 (four) hours as needed for moderate pain.   simvastatin 40 MG tablet Commonly known as: ZOCOR Take 40 mg by mouth at bedtime.   spironolactone 25 MG tablet Commonly known as: ALDACTONE Take 0.5 tablets (12.5 mg total) by mouth daily.   traMADol 50 MG tablet Commonly known as: ULTRAM Take 1 tablet (50 mg total) by mouth every 6 (six) hours as needed for moderate pain (mild pain).   Vitamin D3 125 MCG (5000 UT) Caps Take 5,000 Units by mouth daily.               Discharge Care Instructions  (From admission, onward)           Start     Ordered   06/29/22 0000  Change dressing (specify)       Comments: Measure and record drain output twice daily Bring record to clinic.   06/29/22 1519             Follow-up Information     Almond Lint, MD Follow up in 2 week(s).   Specialty: General Surgery Contact information: [address] [place] [address] [place] [address] [phone]         Care, [facility] Follow up.   Specialty: Home Health Services Contact information: [address] [place] [address] [phone]                 Signed: Almond Lint 07/01/2022, 1:38 PM

## 2022-06-30 NOTE — Evaluation (Signed)
Occupational Therapy Evaluation Patient Details Name: Jennifer Cowan MRN: [id number] DOB: 1945-08-29 Today's Date: 06/30/2022   History of Present Illness Admitted for surgical treatment of R breast mass in setting of know lung Ca and brain lesions; s/p R mastectomy on 6/10;  has a past medical history of Anemia, Arthritis, Breast cancer (HCC), Cancer (HCC) (1999), Family history of breast cancer, Hyperlipidemia, Hypertension, Osteopenia (01/12/2021), and Personal history of malignant neoplasm of breast.   Clinical Impression   Pt s/p above diagnosis. Pt lives at home with sister, 1 story house, has assistance with bathing, was independent with ADLs prior to admission. Pt very fatigued today, some assistance with bed mobility and dressing due to general weakness, has help 24/7 at home from family. Pt was able to perform sit to stand from standard height with min guard, good overall balance, able to ambulate around room as needed min guard with use of RW. Family states they would like a BSC in case Pt goes to daughters home instead, also a one level home with 5 steps to enter, has all other DME at both homes needed to remain safe, family will help with basic ADLs, transfers, feels confident they don't need further OT assistance at home. Pt does not need OT follow up, would benefit from continued OT during acute stay to progress as able.      Recommendations for follow up therapy are one component of a multi-disciplinary discharge planning process, led by the attending physician.  Recommendations may be updated based on patient status, additional functional criteria and insurance authorization.   Assistance Recommended at Discharge Intermittent Supervision/Assistance  Patient can return home with the following A little help with walking and/or transfers;A little help with bathing/dressing/bathroom;Assistance with cooking/housework;Assist for transportation;Help with stairs or ramp for entrance     Functional Status Assessment  Patient has had a recent decline in their functional status and demonstrates the ability to make significant improvements in function in a reasonable and predictable amount of time.  Equipment Recommendations  BSC/3in1    Recommendations for Other Services       Precautions / Restrictions Precautions Precautions: Fall Restrictions Weight Bearing Restrictions: No      Mobility Bed Mobility Overal bed mobility: Needs Assistance Bed Mobility: Sit to Supine, Supine to Sit     Supine to sit: Min assist, HOB elevated Sit to supine: Min assist   General bed mobility comments: min A for HHA, HOB elevated, min A for scooting in bed    Transfers Overall transfer level: Needs assistance Equipment used: Rolling walker (2 wheels) Transfers: Sit to/from Stand, Bed to chair/wheelchair/BSC Sit to Stand: Min guard     Step pivot transfers: Min guard     General transfer comment: min guard today for sit to stand, transfers, cueing for hand placement      Balance Overall balance assessment: Needs assistance Sitting-balance support: Feet supported Sitting balance-Leahy Scale: Good Sitting balance - Comments: fatigued, some back pain   Standing balance support: During functional activity, Reliant on assistive device for balance Standing balance-Leahy Scale: Fair Standing balance comment: RW for support, did not attempt unsupported standing ADLs at sink, reliant on RW today, fatigued                           ADL either performed or assessed with clinical judgement   ADL Overall ADL's : Needs assistance/impaired Eating/Feeding: Independent   Grooming: Set up;Sitting   Upper Body Bathing: Minimal  assistance   Lower Body Bathing: Minimal assistance   Upper Body Dressing : Set up;Sitting   Lower Body Dressing: Sitting/lateral leans;Minimal assistance   Toilet Transfer: Min guard;Ambulation;Rolling walker (2 wheels)   Toileting-  Clothing Manipulation and Hygiene: Supervision/safety       Functional mobility during ADLs: Min guard General ADL Comments: Pt displays good overall ability to perform tasks, some shoulder stiffness, fatigue today limiting independence with tasks.     Vision         Perception     Praxis      Pertinent Vitals/Pain Pain Assessment Pain Assessment: 0-10 Pain Score: 8  Pain Location: R upper back Pain Descriptors / Indicators: Grimacing, Guarding, Crying Pain Intervention(s): Monitored during session     Hand Dominance Right   Extremity/Trunk Assessment Upper Extremity Assessment Upper Extremity Assessment: Generalized weakness   Lower Extremity Assessment Lower Extremity Assessment: Defer to PT evaluation       Communication Communication Communication: No difficulties   Cognition Arousal/Alertness: Awake/alert Behavior During Therapy: WFL for tasks assessed/performed, Flat affect Overall Cognitive Status: Within Functional Limits for tasks assessed                                 General Comments: Pt very tired but able to follow commands as needed     General Comments       Exercises     Shoulder Instructions      Home Living Family/patient expects to be discharged to:: Private residence Living Arrangements: Other relatives Available Help at Discharge: Family;Available 24 hours/day Type of Home: House Home Access: Stairs to enter [employer] of Steps: 1   Home Layout: One level     Bathroom Shower/Tub: Tub/shower unit;Sponge bathes at baseline   [employer]: Standard     Home Equipment: Agricultural consultant (2 wheels);Wheelchair - manual          Prior Functioning/Environment Prior Level of Function : Independent/Modified Independent;Needs assist             Mobility Comments: Functional decline since summer of last year; Reports typically does not need AD for mobility, and when she feels up to it, she enjoys  walking in [facility] ADLs Comments: Typically independent with basic ADLs, Sister provides assist does not drive        OT Problem List: Decreased strength;Decreased range of motion;Decreased activity tolerance;Impaired balance (sitting and/or standing);Impaired UE functional use;Pain      OT Treatment/Interventions: Self-care/ADL training;Therapeutic exercise;Energy conservation;DME and/or AE instruction;Therapeutic activities    OT Goals(Current goals can be found in the care plan section) Acute Rehab OT Goals Patient Stated Goal: to return home OT Goal Formulation: With patient/family Time For Goal Achievement: 07/14/22 Potential to Achieve Goals: Good  OT Frequency: Min 2X/week    Co-evaluation              AM-PAC OT "6 Clicks" Daily Activity     Outcome Measure Help from another person eating meals?: None Help from another person taking care of personal grooming?: A Little Help from another person toileting, which includes using toliet, bedpan, or urinal?: A Little Help from another person bathing (including washing, rinsing, drying)?: A Little Help from another person to put on and taking off regular upper body clothing?: A Little Help from another person to put on and taking off regular lower body clothing?: A Little 6 Click Score: 19   End of Session Equipment  Utilized During Treatment: Gait belt;Rolling walker (2 wheels) Nurse Communication: Mobility status  Activity Tolerance: Patient limited by fatigue Patient left: in bed;with call bell/phone within reach;with bed alarm set;with family/visitor present  OT Visit Diagnosis: Unsteadiness on feet (R26.81);Other abnormalities of gait and mobility (R26.89);Muscle weakness (generalized) (M62.81);History of falling (Z91.81);Pain Pain - part of body:  (back)                Time: 1130-1158 OT Time Calculation (min): 28 min Charges:  OT General Charges $OT Visit: 1 Visit OT Evaluation $OT Eval Low Complexity: 1  Low OT Treatments $Self Care/Home Management : 8-22 mins  [place], OTR/L   Alexis Goodell 06/30/2022, 12:13 PM

## 2022-06-30 NOTE — Progress Notes (Signed)
Patient is being discharged home. Discharge instructions reviewed including new medications, how to empty drain and chart out put to take to follow up appointment. As well as post op care with dressing changes and care. Pt niece at bedside for teaching and verbalized a full understanding. Bedside commode also delivered to bedside. Pt niece is her ride home.

## 2022-06-30 NOTE — Progress Notes (Signed)
VASCULAR LAB    Left upper extremity venous duplex has been performed.  See CV proc for preliminary results.   Kadey Mihalic, RVT 06/30/2022, 11:33 AM

## 2022-07-01 ENCOUNTER — Telehealth: Payer: Self-pay | Admitting: *Deleted

## 2022-07-01 NOTE — Transitions of Care (Post Inpatient/ED Visit) (Signed)
   07/01/2022  Name: Jennifer Cowan MRN: [id number] DOB: [birth year]  Today's TOC FU Call Status: Today's TOC FU Call Status:: Unsuccessul Call (1st Attempt) Unsuccessful Call (1st Attempt) Date: 07/01/22  Attempted to reach the patient regarding the most recent Inpatient/ED visit.  Follow Up Plan: Additional outreach attempts will be made to reach the patient to complete the Transitions of Care (Post Inpatient/ED visit) call.   Gean Maidens BSN RN [facility] [phone]

## 2022-07-05 ENCOUNTER — Encounter: Payer: Self-pay | Admitting: Radiation Oncology

## 2022-07-05 ENCOUNTER — Telehealth: Payer: Self-pay | Admitting: Family Medicine

## 2022-07-05 ENCOUNTER — Telehealth: Payer: Self-pay | Admitting: *Deleted

## 2022-07-05 NOTE — Transitions of Care (Post Inpatient/ED Visit) (Signed)
07/05/2022  Name: Jennifer Cowan MRN: [id number] DOB: 1945-07-22  Today's TOC FU Call Status: Today's TOC FU Call Status:: Successful TOC FU Call Competed TOC FU Call Complete Date: 07/05/22  Transition Care Management Follow-up Telephone Call Date of Discharge: 06/30/22 Dis[facility]) Type of Discharge: Inpatient Admission Primary Inpatient Discharge Diagnosis:: Breast cancer of upper-inner quadrant of right female breast How have you been since you were released from the hospital?: Better Any questions or concerns?: No  Items Reviewed: Did you receive and understand the discharge instructions provided?: Yes Medications obtained,verified, and reconciled?: Yes (Medications Reviewed) Any new allergies since your discharge?: No Dietary orders reviewed?: No Do you have support at home?: Yes People in Home: alone Name of Support/Comfort Primary Source: Niece Dr Jaquita Rector  Medications Reviewed Today: Medications Reviewed Today     Reviewed by Luella Cook, RN (Case Manager) on 07/05/22 at 1607  Med List Status: <None>   Medication Order Taking? Sig Documenting Provider Last Dose Status Informant  Accu-Chek Softclix Lancets lancets [id number]  Use to check blood sugar as directed 4 times a day Pickenpack-Cousar, Arty Baumgartner, NP  Active Family Member  acetaminophen (TYLENOL) 500 MG tablet [id number] Yes Take 1,000 mg by mouth every 6 (six) hours as needed for moderate pain. [provider] Taking Active Family Member  amLODipine (NORVASC) 10 MG tablet [id number] Yes TAKE 1 TABLET BY MOUTH DAILY [name], FNP Taking Active Family Member  aspirin (EQ ASPIRIN) 325 MG tablet [id number] Yes Take 1 tablet (325 mg total) by mouth daily. Almond Lint, MD Taking Active   atorvastatin (LIPITOR) 80 MG tablet [id number]  Take 1 tablet (80 mg total) by mouth daily.  Patient not taking: Reported on 06/24/2022   [name], FNP  Active Family Member   benazepril (LOTENSIN) 10 MG tablet [id number] Yes TAKE 1 TABLET BY MOUTH DAILY [name], FNP Taking Active Family Member  blood glucose meter kit and supplies KIT [id number]  Dispense based on patient and insurance preference. Use up to four times daily as directed. Loa Socks, NP  Active Family Member  Blood Glucose Monitoring Suppl (ACCU-CHEK GUIDE) w/Device Andria Rhein [id number]  Use as directed 4 times a day Causey, Larna Daughters, NP  Active Family Member  Cholecalciferol (VITAMIN D3) 125 MCG (5000 UT) CAPS [id number] Yes Take 5,000 Units by mouth daily. [provider] Taking Active Family Member  dexamethasone (DECADRON) 1 MG tablet [id number] Yes Take 3 tablets (3 mg total) by mouth daily with breakfast for 7 days, THEN 2 tablets (2 mg total) daily with breakfast for 7 days, THEN 1 tablet (1 mg total) daily with breakfast for 7 days. Henreitta Leber, MD Taking Active Family Member  ferrous sulfate 325 (65 FE) MG tablet [id number] Yes Take 325 mg by mouth daily with breakfast. [provider] Taking Active Family Member  gabapentin (NEURONTIN) 100 MG capsule [id number] Yes Take 3 capsules (300 mg total) by mouth 2 (two) times daily.  Patient taking differently: Take 300 mg by mouth 3 (three) times daily.   Rachel Moulds, MD Taking Active Family Member  glucose blood (ACCU-CHEK GUIDE) test strip [id number]  Use as directed 4 times a day Pickenpack-Cousar, Arty Baumgartner, NP  Active Family Member  heparin lock flush 100 unit/mL [id number]   Dorothy Puffer, MD  Active   ketoconazole (NIZORAL) 2 % cream [id number] Yes Apply 1 Application topically 2 (two) times daily. [provider] Taking Active Family Member  levETIRAcetam (  KEPPRA) 500 MG tablet [id number] Yes Take 1 tablet (500 mg total) by mouth 2 (two) times daily. Loetta Rough, MD Taking Active Family Member  lidocaine-prilocaine (EMLA) cream [id number] Yes Apply 1 Application topically as needed [facility] access).  [provider] Taking Active Family Member  meloxicam (MOBIC) 7.5 MG tablet [id number] Yes TAKE 1 TABLET BY MOUTH DAILY [name], FNP Taking Active Family Member  oxyCODONE (OXY IR/ROXICODONE) 5 MG immediate release tablet [id number] Yes Take 1-2 tablets (5-10 mg total) by mouth every 4 (four) hours as needed for moderate pain. Almond Lint, MD Taking Active   simvastatin (ZOCOR) 40 MG tablet [id number] Yes Take 40 mg by mouth at bedtime. [provider] Taking Active Family Member  sodium chloride flush (NS) 0.9 % injection 10 mL [id number]   Dorothy Puffer, MD  Active   spironolactone (ALDACTONE) 25 MG tablet [id number] Yes Take 0.5 tablets (12.5 mg total) by mouth daily. Bensimhon, Bevelyn Buckles, MD Taking Active Family Member  traMADol (ULTRAM) 50 MG tablet [id number] Yes Take 1 tablet (50 mg total) by mouth every 6 (six) hours as needed for moderate pain (mild pain). Almond Lint, MD Taking Active             Home Care and Equipment/Supplies: Were Home Health Services Ordered?: Yes Name of Home Health Agency:: [facility] [place] Has Agency set up a time to come to your home?: Yes First Home Health Visit Date: 07/06/22 Any new equipment or medical supplies ordered?: NA  Functional Questionnaire: Do you need assistance with bathing/showering or dressing?: Yes Do you need assistance with meal preparation?: Yes Do you need assistance with eating?: No Do you have difficulty maintaining continence: No Do you need assistance with getting out of bed/getting out of a chair/moving?: No Do you have difficulty managing or taking your medications?: Yes  Follow up appointments reviewed: PCP Follow-up appointment confirmed?: NA Specialist Hospital Follow-up appointment confirmed?: Yes Date of Specialist follow-up appointment?: 07/11/22 Follow-Up Specialty Provider:: Dr Donell Beers 1:30, [id number] Dr Reymundo Poll 8:45 Do you need transportation to your follow-up appointment?: No Do you understand  care options if your condition(s) worsen?: Yes-patient verbalized understanding   Interventions Today    Flowsheet Row Most Recent Value  General Interventions   General Interventions Discussed/Reviewed General Interventions Discussed, General Interventions Reviewed, Doctor Visits  Pharmacy Interventions   Pharmacy Dicussed/Reviewed Pharmacy Topics Discussed, Pharmacy Topics Reviewed      TOC Interventions Today    Flowsheet Row Most Recent Value  TOC Interventions   TOC Interventions Discussed/Reviewed TOC Interventions Discussed, TOC Interventions Reviewed  Theola Sequin discussed monitoring JP drain and documenting]        Gean Maidens BSN RN [facility] [phone]

## 2022-07-05 NOTE — Progress Notes (Signed)
I spoke with the patient's niece and surrogate decision maker Jennifer Cowan. We discussed that Dr. Mitzi Hansen had reviewed Jennifer Cowan's pathology results and does not recommend  any radiation to the chest following her mastectomy. She will share this with the patient as well.

## 2022-07-05 NOTE — Transitions of Care (Post Inpatient/ED Visit) (Signed)
   07/05/2022  Name: KAHMILA SARTWELL MRN: [id number] DOB: [birth year]  Today's TOC FU Call Status: Today's TOC FU Call Status:: Unsuccessful Call (2nd Attempt) Unsuccessful Call (2nd Attempt) Date: 07/05/22  Attempted to reach the patient regarding the most recent Inpatient/ED visit.  Follow Up Plan: Additional outreach attempts will be made to reach the patient to complete the Transitions of Care (Post Inpatient/ED visit) call.   Gean Maidens BSN RN [facility] [phone]

## 2022-07-06 ENCOUNTER — Telehealth: Payer: Self-pay | Admitting: *Deleted

## 2022-07-06 DIAGNOSIS — C3492 Malignant neoplasm of unspecified part of left bronchus or lung: Secondary | ICD-10-CM | POA: Diagnosis not present

## 2022-07-06 DIAGNOSIS — C7931 Secondary malignant neoplasm of brain: Secondary | ICD-10-CM | POA: Diagnosis not present

## 2022-07-06 DIAGNOSIS — Z483 Aftercare following surgery for neoplasm: Secondary | ICD-10-CM | POA: Diagnosis not present

## 2022-07-06 DIAGNOSIS — C50211 Malignant neoplasm of upper-inner quadrant of right female breast: Secondary | ICD-10-CM | POA: Diagnosis not present

## 2022-07-06 DIAGNOSIS — Z171 Estrogen receptor negative status [ER-]: Secondary | ICD-10-CM | POA: Diagnosis not present

## 2022-07-06 NOTE — Telephone Encounter (Signed)
Need to hold lotensin again, if BP does not return to normal, will need to adjust medications further.

## 2022-07-06 NOTE — Telephone Encounter (Signed)
TC from [place] PT w/ [facility] Seeing pt today, BP's are low initial sitting down 86/46 standing 90/60 laying down 110/66 then sitting again 86/46 just resumed Lotensin yesterday c/o of weakness only, no dizziness or lightheadedness or N/V, this was the only BP med that was held. Please advise

## 2022-07-06 NOTE — Telephone Encounter (Signed)
Home Health Nurse advised of instruction

## 2022-07-07 ENCOUNTER — Other Ambulatory Visit: Payer: Self-pay | Admitting: Family Medicine

## 2022-07-07 DIAGNOSIS — I10 Essential (primary) hypertension: Secondary | ICD-10-CM

## 2022-07-07 DIAGNOSIS — M159 Polyosteoarthritis, unspecified: Secondary | ICD-10-CM

## 2022-07-08 ENCOUNTER — Telehealth: Payer: Self-pay | Admitting: *Deleted

## 2022-07-08 DIAGNOSIS — C3492 Malignant neoplasm of unspecified part of left bronchus or lung: Secondary | ICD-10-CM | POA: Diagnosis not present

## 2022-07-08 DIAGNOSIS — Z171 Estrogen receptor negative status [ER-]: Secondary | ICD-10-CM | POA: Diagnosis not present

## 2022-07-08 DIAGNOSIS — C7931 Secondary malignant neoplasm of brain: Secondary | ICD-10-CM | POA: Diagnosis not present

## 2022-07-08 DIAGNOSIS — C50211 Malignant neoplasm of upper-inner quadrant of right female breast: Secondary | ICD-10-CM | POA: Diagnosis not present

## 2022-07-08 DIAGNOSIS — Z483 Aftercare following surgery for neoplasm: Secondary | ICD-10-CM | POA: Diagnosis not present

## 2022-07-08 NOTE — Telephone Encounter (Signed)
Home health aware and they stated they would reach out to the patient

## 2022-07-08 NOTE — Telephone Encounter (Signed)
TC from [place] PT w/ [facility] Pthas not had Lotensin for 2 days, Amlodipine was also not given BP when she first got there was 100/60 HR 104 After exercise 120/62 HR 120 Sitting after 5 min 96/56 HR 116 Should she resume Amlodipine or continue to stay off of it Please advise.

## 2022-07-08 NOTE — Telephone Encounter (Signed)
Continue to hold amlodipine. Needs follow up with PCP.   [name], FNP

## 2022-07-11 ENCOUNTER — Inpatient Hospital Stay: Payer: 59

## 2022-07-11 ENCOUNTER — Inpatient Hospital Stay (HOSPITAL_BASED_OUTPATIENT_CLINIC_OR_DEPARTMENT_OTHER): Payer: 59 | Admitting: Hematology and Oncology

## 2022-07-11 ENCOUNTER — Other Ambulatory Visit: Payer: Self-pay

## 2022-07-11 ENCOUNTER — Encounter: Payer: Self-pay | Admitting: Hematology and Oncology

## 2022-07-11 VITALS — BP 125/69 | HR 106 | Temp 97.8°F | Resp 16 | Wt 151.5 lb

## 2022-07-11 DIAGNOSIS — Z9221 Personal history of antineoplastic chemotherapy: Secondary | ICD-10-CM | POA: Diagnosis not present

## 2022-07-11 DIAGNOSIS — C3490 Malignant neoplasm of unspecified part of unspecified bronchus or lung: Secondary | ICD-10-CM

## 2022-07-11 DIAGNOSIS — R739 Hyperglycemia, unspecified: Secondary | ICD-10-CM

## 2022-07-11 DIAGNOSIS — Z923 Personal history of irradiation: Secondary | ICD-10-CM | POA: Diagnosis not present

## 2022-07-11 DIAGNOSIS — C50211 Malignant neoplasm of upper-inner quadrant of right female breast: Secondary | ICD-10-CM | POA: Diagnosis not present

## 2022-07-11 DIAGNOSIS — R918 Other nonspecific abnormal finding of lung field: Secondary | ICD-10-CM

## 2022-07-11 DIAGNOSIS — Z79899 Other long term (current) drug therapy: Secondary | ICD-10-CM | POA: Diagnosis not present

## 2022-07-11 DIAGNOSIS — C3412 Malignant neoplasm of upper lobe, left bronchus or lung: Secondary | ICD-10-CM | POA: Diagnosis not present

## 2022-07-11 DIAGNOSIS — Z7952 Long term (current) use of systemic steroids: Secondary | ICD-10-CM | POA: Diagnosis not present

## 2022-07-11 DIAGNOSIS — Z95828 Presence of other vascular implants and grafts: Secondary | ICD-10-CM

## 2022-07-11 DIAGNOSIS — C7931 Secondary malignant neoplasm of brain: Secondary | ICD-10-CM | POA: Diagnosis not present

## 2022-07-11 LAB — CBC WITH DIFFERENTIAL (CANCER CENTER ONLY)
Abs Immature Granulocytes: 0.03 10*3/uL (ref 0.00–0.07)
Basophils Absolute: 0 10*3/uL (ref 0.0–0.1)
Basophils Relative: 0 %
Eosinophils Absolute: 0.1 10*3/uL (ref 0.0–0.5)
Eosinophils Relative: 1 %
HCT: 38.8 % (ref 36.0–46.0)
Hemoglobin: 13 g/dL (ref 12.0–15.0)
Immature Granulocytes: 0 %
Lymphocytes Relative: 6 %
Lymphs Abs: 0.5 10*3/uL — ABNORMAL LOW (ref 0.7–4.0)
MCH: 32.1 pg (ref 26.0–34.0)
MCHC: 33.5 g/dL (ref 30.0–36.0)
MCV: 95.8 fL (ref 80.0–100.0)
Monocytes Absolute: 0.5 10*3/uL (ref 0.1–1.0)
Monocytes Relative: 7 %
Neutro Abs: 6.7 10*3/uL (ref 1.7–7.7)
Neutrophils Relative %: 86 %
Platelet Count: 297 10*3/uL (ref 150–400)
RBC: 4.05 MIL/uL (ref 3.87–5.11)
RDW: 15.5 % (ref 11.5–15.5)
WBC Count: 7.9 10*3/uL (ref 4.0–10.5)
nRBC: 0 % (ref 0.0–0.2)

## 2022-07-11 LAB — CMP (CANCER CENTER ONLY)
ALT: 31 U/L (ref 0–44)
AST: 18 U/L (ref 15–41)
Albumin: 3.4 g/dL — ABNORMAL LOW (ref 3.5–5.0)
Alkaline Phosphatase: 55 U/L (ref 38–126)
Anion gap: 6 (ref 5–15)
BUN: 19 mg/dL (ref 8–23)
CO2: 25 mmol/L (ref 22–32)
Calcium: 9.2 mg/dL (ref 8.9–10.3)
Chloride: 107 mmol/L (ref 98–111)
Creatinine: 0.54 mg/dL (ref 0.44–1.00)
GFR, Estimated: >60 mL/min (ref >60)
Glucose, Bld: 181 mg/dL — ABNORMAL HIGH (ref 70–99)
Potassium: 3.7 mmol/L (ref 3.5–5.1)
Sodium: 138 mmol/L (ref 135–145)
Total Bilirubin: 0.6 mg/dL (ref 0.3–1.2)
Total Protein: 5.8 g/dL — ABNORMAL LOW (ref 6.5–8.1)

## 2022-07-11 LAB — TSH: TSH: 3.738 u[IU]/mL (ref 0.350–4.500)

## 2022-07-11 MED ORDER — ONDANSETRON HCL 8 MG PO TABS
8.0000 mg | ORAL_TABLET | Freq: Three times a day (TID) | ORAL | 1 refills | Status: DC | PRN
Start: 2022-07-11 — End: 2022-09-10

## 2022-07-11 MED ORDER — HEPARIN SOD (PORK) LOCK FLUSH 100 UNIT/ML IV SOLN
500.0000 [IU] | Freq: Once | INTRAVENOUS | Status: AC
  Administered 2022-07-11: 500 [IU]

## 2022-07-11 MED ORDER — SODIUM CHLORIDE 0.9% FLUSH
10.0000 mL | Freq: Once | INTRAVENOUS | Status: AC
  Administered 2022-07-11: 10 mL

## 2022-07-11 MED ORDER — PROCHLORPERAZINE MALEATE 10 MG PO TABS
10.0000 mg | ORAL_TABLET | Freq: Four times a day (QID) | ORAL | 1 refills | Status: DC | PRN
Start: 2022-07-11 — End: 2022-09-10

## 2022-07-11 NOTE — Progress Notes (Signed)
[facility] Cancer Follow up:    [name], FNP [address] [place] [address]   DIAGNOSIS: lung cancer and breast cancer   SUMMARY OF ONCOLOGIC HISTORY: Oncology History  Infiltrating duct carcinoma (HCC)  07/25/2021 Initial Diagnosis   Infiltrating duct carcinoma (HCC)   02/27/2022 Genetic Testing   Negative genetic testing on the CancerNext-Expanded+RNAinsight panel.  The report date is February 27, 2022.  The CancerNext-Expanded gene panel offered by [facility] and includes sequencing and rearrangement analysis for the following 77 genes: AIP, ALK, APC*, ATM*, AXIN2, BAP1, BARD1, BLM, BMPR1A, BRCA1*, BRCA2*, BRIP1*, CDC73, CDH1*, CDK4, CDKN1B, CDKN2A, CHEK2*, CTNNA1, DICER1, FANCC, FH, FLCN, GALNT12, KIF1B, LZTR1, MAX, MEN1, MET, MLH1*, MSH2*, MSH3, MSH6*, MUTYH*, NBN, NF1*, NF2, NTHL1, PALB2*, PHOX2B, PMS2*, POT1, PRKAR1A, PTCH1, PTEN*, RAD51C*, RAD51D*, RB1, RECQL, RET, SDHA, SDHAF2, SDHB, SDHC, SDHD, SMAD4, SMARCA4, SMARCB1, SMARCE1, STK11, SUFU, TMEM127, TP53*, TSC1, TSC2, VHL and XRCC2 (sequencing and deletion/duplication); EGFR, EGLN1, HOXB13, KIT, MITF, PDGFRA, POLD1, and POLE (sequencing only); EPCAM and GREM1 (deletion/duplication only). DNA and RNA analyses performed for * genes.    06/15/2022 Cancer Staging   Staging form: Breast, AJCC 8th Edition - Clinical: Stage IIIB (cT3, cN0, cM0, G3, ER-, PR-, HER2-) - Signed by Rachel Moulds, MD on 06/15/2022 Histologic grading system: 3 grade system   Malignant neoplasm of unspecified part of unspecified bronchus or lung (HCC)  08/13/2021 Initial Diagnosis   Malignant neoplasm of unspecified part of unspecified bronchus or lung (HCC)   08/30/2021 - 09/13/2021 Chemotherapy   Patient is on Treatment Plan : LUNG Carboplatin / Paclitaxel + XRT q7d     08/30/2021 - 10/11/2021 Chemotherapy   Patient is on Treatment Plan : LUNG Carboplatin + Paclitaxel + XRT q7d     11/16/2021 - 02/17/2022 Chemotherapy   Patient  is on Treatment Plan : LUNG Pembrolizumab Q 21D     03/16/2022 - 03/16/2022 Chemotherapy   Patient is on Treatment Plan : BREAST Pembrolizumab (400) D1 + Carboplatin (1.5) weekly + Paclitaxel (80) weekly q42d X 2 cycles / Pembrolizumab (400) D1 + AC D1,22 q42d x 2 cycles     03/18/2022 - 05/06/2022 Chemotherapy   Patient is on Treatment Plan : BREAST Pembrolizumab (200) D1 + Carboplatin (2) D1,8,15 + Paclitaxel (45) D1,8,15 q21d X 2 cycles     06/15/2022 Cancer Staging   Staging form: Lung, AJCC 8th Edition - Clinical: Stage IV (cTX, cM1) - Signed by Rachel Moulds, MD on 06/15/2022   07/18/2022 -  Chemotherapy   Patient is on Treatment Plan : LUNG Pembrolizumab (200) q21d x 27 weeks       CURRENT THERAPY: Pembrolizumab  INTERVAL HISTORY:  Jennifer Cowan 77 y.o. female returns for follow-up.  Interval History  Since her last visit here, she is s/p right mastectomy and that showed grade 3 IDC with extensive necrosis histiocytic inflammation and calcifications consistent with treatment effect.  Tumor size measured 3.2 x 3 x 2.8 cm, negative margins, ER/PR and HER2 negative, Ki-67 of 90% all sentinel lymph nodes without any evidence of metastatic carcinoma. She continues to complain of some pain in the back since the shingles.  She has her drain and has a follow-up appointment with the surgical team today.  She is tired this morning, she prefers afternoon appointments.  Otherwise she denies any new complaints.  She did not have any more seizures.  She is still on Dex taper as instructed by Dr. Barbaraann Cao and has a follow-up imaging  on August 25, 2022. Rest of the pertinent 10 point ROS reviewed and neg  Patient Active Problem List   Diagnosis Date Noted   Breast cancer of upper-inner quadrant of right female breast (HCC) 06/27/2022   Genetic testing 02/28/2022   Family history of breast cancer 02/17/2022   Stroke (cerebrum) (HCC) 12/16/2021   Type 2 diabetes mellitus with hyperglycemia, without  long-term current use of insulin (HCC) 10/06/2021   Port-A-Cath in place 08/30/2021   Hyperglycemia 08/30/2021   Malignant neoplasm of unspecified part of unspecified bronchus or lung (HCC) 08/13/2021   Physical deconditioning 07/27/2021   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   Mass of upper lobe of left lung 07/25/2021   Metastasis to brain (HCC) 07/24/2021   Overweight 07/07/2021   Osteopenia 01/12/2021   History of breast cancer 03/05/2020   Hypertension associated with type 2 diabetes mellitus (HCC) 06/26/2018   Iron deficiency anemia 06/26/2018   Hyperlipidemia associated with type 2 diabetes mellitus (HCC) 06/26/2018   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  MEDICAL HISTORY: Past Medical History:  Diagnosis Date   Anemia    Arthritis    Breast cancer (HCC)    Cancer (HCC) 1999   Breast Cancer   Family history of breast cancer    Hyperlipidemia    Hypertension    Osteopenia 01/12/2021   Personal history of malignant neoplasm of breast     SURGICAL HISTORY: Past Surgical History:  Procedure Laterality Date   BREAST SURGERY  1999   Masectomy- left   BRONCHIAL BIOPSY  07/30/2021   Procedure: BRONCHIAL BIOPSIES;  Surgeon: Leslye Peer, MD;  Location: [facility] ENDOSCOPY;  Service: Pulmonary;;   BRONCHIAL BRUSHINGS  07/30/2021   Procedure: BRONCHIAL BRUSHINGS;  Surgeon: Leslye Peer, MD;  Location: [facility] ENDOSCOPY;  Service: Pulmonary;;   BRONCHIAL NEEDLE ASPIRATION BIOPSY  07/30/2021   Procedure: BRONCHIAL NEEDLE ASPIRATION BIOPSIES;  Surgeon: Leslye Peer, MD;  Location: [facility] ENDOSCOPY;  Service: Pulmonary;;   CYSTECTOMY     cyst removed off top of head   IR IMAGING GUIDED PORT INSERTION  08/20/2021   MASTECTOMY     MASTECTOMY W/ SENTINEL NODE BIOPSY Right 06/27/2022   Procedure: RIGHT MASTECTOMY WITH SENTINEL LYMPH NODE BIOPSY;  Surgeon: Almond Lint, MD;  Location: [facility] OR;  Service: General;  Laterality: Right;    VIDEO BRONCHOSCOPY WITH RADIAL ENDOBRONCHIAL ULTRASOUND  07/30/2021   Procedure: VIDEO BRONCHOSCOPY WITH RADIAL ENDOBRONCHIAL ULTRASOUND;  Surgeon: Leslye Peer, MD;  Location: [facility] ENDOSCOPY;  Service: Pulmonary;;    SOCIAL HISTORY: Social History   Socioeconomic History   Marital status: Single    Spouse name: Not on file   Number of children: 0   Years of education: 12   Highest education level: High school graduate  Occupational History   Occupation: retired  Tobacco Use   Smoking status: Never   Smokeless tobacco: Never  Vaping Use   Vaping Use: Never used  Substance and Sexual Activity   Alcohol use: No   Drug use: Never   Sexual activity: Yes    Birth control/protection: Post-menopausal  Other Topics Concern   Not on file  Social History Narrative   Not on file   Social Determinants of Health   Financial Resource Strain: Not on file  Food Insecurity: No Food Insecurity (06/27/2022)   Hunger Vital Sign    Worried About Running Out of Food in the Last Year: Never true    Ran  Out of Food in the Last Year: Never true  Transportation Needs: No Transportation Needs (06/27/2022)   PRAPARE - Administrator, Civil Service (Medical): No    Lack of Transportation (Non-Medical): No  Physical Activity: Not on file  Stress: Not on file  Social Connections: Not on file  Intimate Partner Violence: Not At Risk (06/27/2022)   Humiliation, Afraid, Rape, and Kick questionnaire    Fear of Current or Ex-Partner: No    Emotionally Abused: No    Physically Abused: No    Sexually Abused: No    FAMILY HISTORY: Family History  Problem Relation Age of Onset   Arthritis Mother    Breast cancer Sister 75   Diabetes Sister    Hyperlipidemia Sister    Hypertension Sister    Stroke Sister    Dementia Sister    Diabetes Brother    Hypertension Brother    Breast cancer Niece 66    Review of Systems  Constitutional:  Positive for fatigue. Negative for appetite change,  chills, fever and unexpected weight change.  HENT:   Negative for hearing loss, lump/mass and trouble swallowing.   Eyes:  Negative for eye problems and icterus.  Respiratory:  Negative for chest tightness, cough and shortness of breath.   Cardiovascular:  Negative for chest pain, leg swelling and palpitations.  Gastrointestinal:  Negative for abdominal distention, abdominal pain, constipation, diarrhea, nausea and vomiting.  Endocrine: Negative for hot flashes.  Genitourinary:  Negative for difficulty urinating.   Musculoskeletal:  Positive for back pain. Negative for arthralgias.  Skin:  Negative for itching and rash.  Neurological:  Negative for dizziness, extremity weakness, headaches and numbness.  Hematological:  Negative for adenopathy. Does not bruise/bleed easily.  Psychiatric/Behavioral:  Negative for depression. The patient is not nervous/anxious.       PHYSICAL EXAMINATION  ECOG PERFORMANCE STATUS: 1 - Symptomatic but completely ambulatory  Vitals:   07/11/22 0843  BP: 125/69  Pulse: (!) 106  Resp: 16  Temp: 97.8 F (36.6 C)  SpO2: 95%      Physical Exam Constitutional:      General: She is not in acute distress.    Appearance: Normal appearance. She is not toxic-appearing.  HENT:     Head: Normocephalic and atraumatic.  Eyes:     General: No scleral icterus. Cardiovascular:     Rate and Rhythm: Normal rate and regular rhythm.     Pulses: Normal pulses.     Heart sounds: Normal heart sounds.  Pulmonary:     Effort: Pulmonary effort is normal.     Breath sounds: Normal breath sounds.  Chest:     Comments: S/p mastectomy Abdominal:     General: Abdomen is flat. Bowel sounds are normal. There is no distension.     Palpations: Abdomen is soft.     Tenderness: There is no abdominal tenderness.  Musculoskeletal:        General: No swelling.     Cervical back: Neck supple.  Lymphadenopathy:     Cervical: No cervical adenopathy.  Skin:    General: Skin  is warm and dry.     Findings: No rash.  Neurological:     General: No focal deficit present.     Mental Status: She is alert.  Psychiatric:        Mood and Affect: Mood normal.        Behavior: Behavior normal.     LABORATORY DATA:  CBC    Component  Value Date/Time   WBC 7.9 07/11/2022 0819   WBC 16.8 (H) 06/28/2022 0728   RBC 4.05 07/11/2022 0819   HGB 13.0 07/11/2022 0819   HGB 15.7 07/07/2021 1538   HCT 38.8 07/11/2022 0819   HCT 46.6 07/07/2021 1538   PLT 297 07/11/2022 0819   PLT 450 07/07/2021 1538   MCV 95.8 07/11/2022 0819   MCV 88 07/07/2021 1538   MCH 32.1 07/11/2022 0819   MCHC 33.5 07/11/2022 0819   RDW 15.5 07/11/2022 0819   RDW 12.9 07/07/2021 1538   LYMPHSABS 0.5 (L) 07/11/2022 0819   LYMPHSABS 1.6 07/07/2021 1538   MONOABS 0.5 07/11/2022 0819   EOSABS 0.1 07/11/2022 0819   EOSABS 0.1 07/07/2021 1538   BASOSABS 0.0 07/11/2022 0819   BASOSABS 0.0 07/07/2021 1538    CMP     Component Value Date/Time   NA 138 07/11/2022 0819   NA 141 07/07/2021 1538   K 3.7 07/11/2022 0819   CL 107 07/11/2022 0819   CO2 25 07/11/2022 0819   GLUCOSE 181 (H) 07/11/2022 0819   BUN 19 07/11/2022 0819   BUN 9 07/07/2021 1538   CREATININE 0.54 07/11/2022 0819   CALCIUM 9.2 07/11/2022 0819   PROT 5.8 (L) 07/11/2022 0819   PROT 7.9 07/07/2021 1538   ALBUMIN 3.4 (L) 07/11/2022 0819   ALBUMIN 4.6 07/07/2021 1538   AST 18 07/11/2022 0819   ALT 31 07/11/2022 0819   ALKPHOS 55 07/11/2022 0819   BILITOT 0.6 07/11/2022 0819   GFRNONAA >60 07/11/2022 0819   GFRAA 103 03/05/2020 1119    ASSESSMENT and THERAPY PLAN:   Malignant neoplasm of unspecified part of unspecified bronchus or lung (HCC) This is a 77 year old patient with 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  She also appears to have had some regional adenopathy including prevascular, AP window and left hilar adenopathy.  She has  multiple intracranial metastatic lesions which are likely secondary to metastatic lung cancer given presence of regional adenopathy.   She is now status post SRS to the brain lesions. She started concurrent chemoradiation with CarboTaxol on August 30, 2021.  She completed chemoradiation on October 15, 2021.  End of treatment PET/CT reviewed consistent with response in the lung, likely postradiation changes noted. PET also commented on decrease in size of the breast mass.  We started noticing increasing size of the right breast mass while she was on immunotherapy alone. We then restarted her on weekly CarboTaxol with immunotherapy.  She responded really well to chemo, and had significant decrease in the right breast mass She is now post right mastectomy, 3.2 cm as a final size of the tumor, high-grade triple negative, negative margins, no evidence of sentinel lymph node involvement.  No indication for postmastectomy radiation.  She will follow-up with breast surgery team in anticipation of drain removal.  We anticipate starting immunotherapy next week.  Will plan to continue it for a total of 1 year. At this point we will continue immunotherapy maintenance and follow-up with repeat imaging every 3 to 4 months. Continue follow-up with Dr. Barbaraann Cao, continue steroid taper as instructed, repeat MR imaging scheduled for August 8. She already received 8 cycles of adjuvant keytruda ( added 9 more) to complete a whole yr of immunotherapy which is SOC for lung cancer  Rachel Moulds MD

## 2022-07-11 NOTE — Assessment & Plan Note (Addendum)
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  She also appears to have had some regional adenopathy including prevascular, AP window and left hilar adenopathy.  She has multiple intracranial metastatic lesions which are likely secondary to metastatic lung cancer given presence of regional adenopathy.   She is now status post SRS to the brain lesions. She started concurrent chemoradiation with CarboTaxol on August 30, 2021.  She completed chemoradiation on October 15, 2021.  End of treatment PET/CT reviewed consistent with response in the lung, likely postradiation changes noted. PET also commented on decrease in size of the breast mass.  We started noticing increasing size of the right breast mass while she was on immunotherapy alone. We then restarted her on weekly CarboTaxol with immunotherapy.  She responded really well to chemo, and had significant decrease in the right breast mass She is now post right mastectomy, 3.2 cm as a final size of the tumor, high-grade triple negative, negative margins, no evidence of sentinel lymph node involvement.  No indication for postmastectomy radiation.  She will follow-up with breast surgery team in anticipation of drain removal.  We anticipate starting immunotherapy next week.  Will plan to continue it for a total of 1 year. At this point we will continue immunotherapy maintenance and follow-up with repeat imaging every 3 to 4 months. Continue follow-up with Dr. Barbaraann Cao, continue steroid taper as instructed, repeat MR imaging scheduled for August 8. She already received 8 cycles of adjuvant keytruda ( added 9 more) to complete a whole yr of immunotherapy which is SOC for lung cancer  Rachel Moulds MD

## 2022-07-11 NOTE — Telephone Encounter (Signed)
Ok for letter for this. Is there a specific form they need?

## 2022-07-12 ENCOUNTER — Telehealth: Payer: Self-pay | Admitting: *Deleted

## 2022-07-12 ENCOUNTER — Telehealth: Payer: Self-pay | Admitting: Hematology and Oncology

## 2022-07-12 DIAGNOSIS — Z171 Estrogen receptor negative status [ER-]: Secondary | ICD-10-CM | POA: Diagnosis not present

## 2022-07-12 DIAGNOSIS — C50211 Malignant neoplasm of upper-inner quadrant of right female breast: Secondary | ICD-10-CM | POA: Diagnosis not present

## 2022-07-12 DIAGNOSIS — Z483 Aftercare following surgery for neoplasm: Secondary | ICD-10-CM | POA: Diagnosis not present

## 2022-07-12 DIAGNOSIS — C3492 Malignant neoplasm of unspecified part of left bronchus or lung: Secondary | ICD-10-CM | POA: Diagnosis not present

## 2022-07-12 DIAGNOSIS — C7931 Secondary malignant neoplasm of brain: Secondary | ICD-10-CM | POA: Diagnosis not present

## 2022-07-12 NOTE — Telephone Encounter (Signed)
FYI: TC from [place] PT w/ [facility] BP at rest when she first got there today was 94/58 p 112 Has not taken Amlodipine or Lotensin Overall she is doing better, has more energy, will continue to monitor BP

## 2022-07-12 NOTE — Telephone Encounter (Signed)
Spoke with patient confirming upcoming appointment  

## 2022-07-13 ENCOUNTER — Ambulatory Visit (INDEPENDENT_AMBULATORY_CARE_PROVIDER_SITE_OTHER): Payer: 59

## 2022-07-13 DIAGNOSIS — C3492 Malignant neoplasm of unspecified part of left bronchus or lung: Secondary | ICD-10-CM | POA: Diagnosis not present

## 2022-07-13 DIAGNOSIS — E119 Type 2 diabetes mellitus without complications: Secondary | ICD-10-CM

## 2022-07-13 DIAGNOSIS — Z171 Estrogen receptor negative status [ER-]: Secondary | ICD-10-CM

## 2022-07-13 DIAGNOSIS — C50211 Malignant neoplasm of upper-inner quadrant of right female breast: Secondary | ICD-10-CM | POA: Diagnosis not present

## 2022-07-13 DIAGNOSIS — Z483 Aftercare following surgery for neoplasm: Secondary | ICD-10-CM

## 2022-07-13 DIAGNOSIS — E669 Obesity, unspecified: Secondary | ICD-10-CM

## 2022-07-13 DIAGNOSIS — C7931 Secondary malignant neoplasm of brain: Secondary | ICD-10-CM | POA: Diagnosis not present

## 2022-07-13 DIAGNOSIS — I959 Hypotension, unspecified: Secondary | ICD-10-CM | POA: Diagnosis not present

## 2022-07-13 DIAGNOSIS — I10 Essential (primary) hypertension: Secondary | ICD-10-CM

## 2022-07-13 DIAGNOSIS — F419 Anxiety disorder, unspecified: Secondary | ICD-10-CM

## 2022-07-14 ENCOUNTER — Telehealth: Payer: Self-pay | Admitting: Family Medicine

## 2022-07-14 DIAGNOSIS — C7931 Secondary malignant neoplasm of brain: Secondary | ICD-10-CM | POA: Diagnosis not present

## 2022-07-14 DIAGNOSIS — C50211 Malignant neoplasm of upper-inner quadrant of right female breast: Secondary | ICD-10-CM | POA: Diagnosis not present

## 2022-07-14 DIAGNOSIS — Z171 Estrogen receptor negative status [ER-]: Secondary | ICD-10-CM | POA: Diagnosis not present

## 2022-07-14 DIAGNOSIS — Z483 Aftercare following surgery for neoplasm: Secondary | ICD-10-CM | POA: Diagnosis not present

## 2022-07-14 DIAGNOSIS — C3492 Malignant neoplasm of unspecified part of left bronchus or lung: Secondary | ICD-10-CM | POA: Diagnosis not present

## 2022-07-14 NOTE — Telephone Encounter (Signed)
[facility] reporting vitals for pcp Today at rest bp 110/60 heart rate 116 After activity heart rate142 Not able to get heart rate below 120 Pt at min activity she becomes SOB Asking for advice on how to proceed, please call back

## 2022-07-14 NOTE — Telephone Encounter (Signed)
NTBS for evaluation and labs. Has she had BP meds? Could HH draw a CBC and BMP?

## 2022-07-14 NOTE — Telephone Encounter (Signed)
Left detailed message on home health nurse's voice mail

## 2022-07-18 ENCOUNTER — Inpatient Hospital Stay: Payer: 59

## 2022-07-18 ENCOUNTER — Inpatient Hospital Stay: Payer: 59 | Attending: Hematology and Oncology

## 2022-07-18 VITALS — BP 116/64 | HR 104 | Temp 97.7°F | Resp 18 | Wt 149.4 lb

## 2022-07-18 DIAGNOSIS — C3412 Malignant neoplasm of upper lobe, left bronchus or lung: Secondary | ICD-10-CM | POA: Diagnosis not present

## 2022-07-18 DIAGNOSIS — R739 Hyperglycemia, unspecified: Secondary | ICD-10-CM

## 2022-07-18 DIAGNOSIS — C3490 Malignant neoplasm of unspecified part of unspecified bronchus or lung: Secondary | ICD-10-CM

## 2022-07-18 DIAGNOSIS — Z5111 Encounter for antineoplastic chemotherapy: Secondary | ICD-10-CM | POA: Insufficient documentation

## 2022-07-18 DIAGNOSIS — C7931 Secondary malignant neoplasm of brain: Secondary | ICD-10-CM | POA: Insufficient documentation

## 2022-07-18 DIAGNOSIS — Z79899 Other long term (current) drug therapy: Secondary | ICD-10-CM | POA: Diagnosis not present

## 2022-07-18 DIAGNOSIS — Z95828 Presence of other vascular implants and grafts: Secondary | ICD-10-CM

## 2022-07-18 LAB — CBC WITH DIFFERENTIAL (CANCER CENTER ONLY)
Abs Immature Granulocytes: 0.03 10*3/uL (ref 0.00–0.07)
Basophils Absolute: 0 10*3/uL (ref 0.0–0.1)
Basophils Relative: 0 %
Eosinophils Absolute: 0 10*3/uL (ref 0.0–0.5)
Eosinophils Relative: 0 %
HCT: 40.6 % (ref 36.0–46.0)
Hemoglobin: 13.8 g/dL (ref 12.0–15.0)
Immature Granulocytes: 1 %
Lymphocytes Relative: 5 %
Lymphs Abs: 0.3 10*3/uL — ABNORMAL LOW (ref 0.7–4.0)
MCH: 32.7 pg (ref 26.0–34.0)
MCHC: 34 g/dL (ref 30.0–36.0)
MCV: 96.2 fL (ref 80.0–100.0)
Monocytes Absolute: 0.4 10*3/uL (ref 0.1–1.0)
Monocytes Relative: 7 %
Neutro Abs: 5.1 10*3/uL (ref 1.7–7.7)
Neutrophils Relative %: 87 %
Platelet Count: 247 10*3/uL (ref 150–400)
RBC: 4.22 MIL/uL (ref 3.87–5.11)
RDW: 15.5 % (ref 11.5–15.5)
WBC Count: 5.9 10*3/uL (ref 4.0–10.5)
nRBC: 0 % (ref 0.0–0.2)

## 2022-07-18 LAB — CMP (CANCER CENTER ONLY)
ALT: 27 U/L (ref 0–44)
AST: 16 U/L (ref 15–41)
Albumin: 3.5 g/dL (ref 3.5–5.0)
Alkaline Phosphatase: 59 U/L (ref 38–126)
Anion gap: 8 (ref 5–15)
BUN: 14 mg/dL (ref 8–23)
CO2: 25 mmol/L (ref 22–32)
Calcium: 8.9 mg/dL (ref 8.9–10.3)
Chloride: 108 mmol/L (ref 98–111)
Creatinine: 0.52 mg/dL (ref 0.44–1.00)
GFR, Estimated: >60 mL/min (ref >60)
Glucose, Bld: 111 mg/dL — ABNORMAL HIGH (ref 70–99)
Potassium: 3.8 mmol/L (ref 3.5–5.1)
Sodium: 141 mmol/L (ref 135–145)
Total Bilirubin: 0.6 mg/dL (ref 0.3–1.2)
Total Protein: 6.4 g/dL — ABNORMAL LOW (ref 6.5–8.1)

## 2022-07-18 LAB — TSH: TSH: 2.829 u[IU]/mL (ref 0.350–4.500)

## 2022-07-18 MED ORDER — SODIUM CHLORIDE 0.9% FLUSH
10.0000 mL | Freq: Once | INTRAVENOUS | Status: AC
  Administered 2022-07-18: 10 mL

## 2022-07-18 MED ORDER — SODIUM CHLORIDE 0.9% FLUSH
10.0000 mL | INTRAVENOUS | Status: DC | PRN
  Administered 2022-07-18: 10 mL

## 2022-07-18 MED ORDER — SODIUM CHLORIDE 0.9 % IV SOLN
200.0000 mg | Freq: Once | INTRAVENOUS | Status: AC
  Administered 2022-07-18: 200 mg via INTRAVENOUS
  Filled 2022-07-18: qty 200

## 2022-07-18 MED ORDER — SODIUM CHLORIDE 0.9% FLUSH: 10.0000 mL | Freq: Once | INTRAVENOUS | Status: DC

## 2022-07-18 MED ORDER — SODIUM CHLORIDE 0.9 % IV SOLN: Freq: Once | INTRAVENOUS | Status: AC

## 2022-07-18 MED ORDER — HEPARIN SOD (PORK) LOCK FLUSH 100 UNIT/ML IV SOLN
500.0000 [IU] | Freq: Once | INTRAVENOUS | Status: AC | PRN
  Administered 2022-07-18: 500 [IU]

## 2022-07-18 NOTE — Patient Instructions (Signed)
[facility]  Discharge Instructions: Thank you for choosing [facility] to provide your oncology and hematology care.   If you have a lab appointment with the Cancer Center, please go directly to the Cancer Center and check in at the registration area.   Wear comfortable clothing and clothing appropriate for easy access to any Portacath or PICC line.   We strive to give you quality time with your provider. You may need to reschedule your appointment if you arrive late (15 or more minutes).  Arriving late affects you and other patients whose appointments are after yours.  Also, if you miss three or more appointments without notifying the office, you may be dismissed from the clinic at the provider's discretion.      For prescription refill requests, have your pharmacy contact our office and allow 72 hours for refills to be completed.    Today you received the following chemotherapy and/or immunotherapy agents: Keytruda      To help prevent nausea and vomiting after your treatment, we encourage you to take your nausea medication as directed.  BELOW ARE SYMPTOMS THAT SHOULD BE REPORTED IMMEDIATELY: *FEVER GREATER THAN 100.4 F (38 C) OR HIGHER *CHILLS OR SWEATING *NAUSEA AND VOMITING THAT IS NOT CONTROLLED WITH YOUR NAUSEA MEDICATION *UNUSUAL SHORTNESS OF BREATH *UNUSUAL BRUISING OR BLEEDING *URINARY PROBLEMS (pain or burning when urinating, or frequent urination) *BOWEL PROBLEMS (unusual diarrhea, constipation, pain near the anus) TENDERNESS IN MOUTH AND THROAT WITH OR WITHOUT PRESENCE OF ULCERS (sore throat, sores in mouth, or a toothache) UNUSUAL RASH, SWELLING OR PAIN  UNUSUAL VAGINAL DISCHARGE OR ITCHING   Items with * indicate a potential emergency and should be followed up as soon as possible or go to the Emergency Department if any problems should occur.  Please show the CHEMOTHERAPY ALERT CARD or IMMUNOTHERAPY ALERT CARD at  check-in to the Emergency Department and triage nurse.  Should you have questions after your visit or need to cancel or reschedule your appointment, please contact [facility]  Dept: [phone]  and follow the prompts.  Office hours are 8:00 a.m. to 4:30 p.m. Monday - Friday. Please note that voicemails left after 4:00 p.m. may not be returned until the following business day.  We are closed weekends and major holidays. You have access to a nurse at all times for urgent questions. Please call the main number to the clinic Dept: [phone] and follow the prompts.   For any non-urgent questions, you may also contact your provider using MyChart. We now offer e-Visits for anyone 18 and older to request care online for non-urgent symptoms. For details visit [email or web address].   Also download the MyChart app! Go to the app store, search "MyChart", open the app, select [facility], and log in with your MyChart username and password.   

## 2022-07-18 NOTE — Progress Notes (Signed)
Per Conway PA, ok to treat with elevated HR today.

## 2022-07-19 LAB — T4: T4, Total: 7.2 ug/dL (ref 4.5–12.0)

## 2022-07-27 ENCOUNTER — Encounter: Payer: Self-pay | Admitting: Family Medicine

## 2022-07-27 ENCOUNTER — Ambulatory Visit (INDEPENDENT_AMBULATORY_CARE_PROVIDER_SITE_OTHER): Payer: 59 | Admitting: Family Medicine

## 2022-07-27 VITALS — Temp 98.1°F | Resp 20 | Ht 68.0 in | Wt 152.5 lb

## 2022-07-27 DIAGNOSIS — C3490 Malignant neoplasm of unspecified part of unspecified bronchus or lung: Secondary | ICD-10-CM

## 2022-07-27 DIAGNOSIS — R000 Tachycardia, unspecified: Secondary | ICD-10-CM

## 2022-07-27 DIAGNOSIS — E1159 Type 2 diabetes mellitus with other circulatory complications: Secondary | ICD-10-CM

## 2022-07-27 DIAGNOSIS — I152 Hypertension secondary to endocrine disorders: Secondary | ICD-10-CM | POA: Diagnosis not present

## 2022-07-27 MED ORDER — ATENOLOL 25 MG PO TABS
12.5000 mg | ORAL_TABLET | Freq: Every day | ORAL | 3 refills | Status: DC
Start: 2022-07-27 — End: 2022-09-12

## 2022-07-27 NOTE — Progress Notes (Signed)
Established Patient Office Visit  Subjective   Patient ID: Jennifer Cowan, female    DOB: 04/02/1945  Age: 77 y.o. MRN: [id number]  Chief Complaint  Patient presents with   Medical Management of Chronic Issues    HPI Jennifer Cowan is here with family today for a follow up of HTN. She has had low BPs since her breast surgery last month. She was also dizzy. Following surgery. Her amlodipine and benazepril has been stopped since this. She has not been able to complete home PT as her heart rate is too elevated and she becomes short of breath with activity. She denies dizziness or lightheadedness. Denies cheat pain. She is not drinking much fluids. She did recently have labs done. She is using a walker for ambulation. She feels fatigued at baseline  Family is trying to find a caregiver to assistant with caring for Jennifer Cowan so that they are able to get a break. They also report that [facility] doesn't listen to their recommendations well. They have contacted some assisted livings and are not able to afford this. Jennifer Cowan has met with palliative care team with oncology.      ROS As per HPI.    Objective:     Temp 98.1 F (36.7 C) (Oral)   Resp 20   Ht 5\' 8"  (1.727 m)   Wt 152 lb 8 oz (69.2 kg)   SpO2 99%   BMI 23.19 kg/m  BP Readings from Last 3 Encounters:  07/18/22 116/64  07/11/22 125/69  06/30/22 116/60      Physical Exam Vitals and nursing note reviewed.  Constitutional:      General: She is not in acute distress.    Appearance: She is not ill-appearing, toxic-appearing or diaphoretic.  Neck:     Vascular: No carotid bruit.  Cardiovascular:     Rate and Rhythm: Regular rhythm. Tachycardia present.     Heart sounds: Normal heart sounds. No murmur heard. Pulmonary:     Effort: No respiratory distress.     Breath sounds: Normal breath sounds. No wheezing.  Abdominal:     General: Bowel sounds are normal. There is no distension.     Palpations: Abdomen is soft.     Tenderness: There is no  abdominal tenderness. There is no guarding.  Musculoskeletal:     Cervical back: Neck supple. No rigidity.  Neurological:     Mental Status: She is alert and oriented to person, place, and time. Mental status is at baseline.     Motor: Weakness (generalized) present.     Gait: Gait abnormal (arrives with rolling walker).  Psychiatric:        Attention and Perception: Attention normal.        Mood and Affect: Affect is flat.        Speech: Speech normal.        Behavior: Behavior is cooperative.      No results found for any visits on 07/27/22.    The ASCVD Risk score (Arnett DK, et al., 2019) failed to calculate for the following reasons:   The patient has a prior MI or stroke diagnosis    Assessment & Plan:   Brittnay was seen today for medical management of chronic issues.  Diagnoses and all orders for this visit:  Tachycardia -     EKG 12-Lead -     atenolol (TENORMIN) 25 MG tablet; Take 0.5 tablets (12.5 mg total) by mouth daily.  Hypertension associated with type 2 diabetes mellitus (HCC)  Malignant neoplasm of unspecified part of unspecified bronchus or lung (HCC)   BP at goal today. Negative orthostatics. Sinus tachy with short PR interval today on EKG today. Reviewed CBC, CMP, and TSH from 07/18/22 that were unremarkable. Will try low dose of atenolol for HR. Continue to hold benazepril and amlodipine for now. Discussed hydration. Discussed applying for medicaid as secondary insurance [facility]. Continue follow up with oncology and surgeon as schedule.    Follow up in 4 weeks, sooner for new or worsening symptoms.    [name], FNP

## 2022-07-28 ENCOUNTER — Telehealth: Payer: Self-pay | Admitting: Family Medicine

## 2022-07-28 DIAGNOSIS — C7931 Secondary malignant neoplasm of brain: Secondary | ICD-10-CM | POA: Diagnosis not present

## 2022-07-28 DIAGNOSIS — C50211 Malignant neoplasm of upper-inner quadrant of right female breast: Secondary | ICD-10-CM | POA: Diagnosis not present

## 2022-07-28 DIAGNOSIS — Z483 Aftercare following surgery for neoplasm: Secondary | ICD-10-CM | POA: Diagnosis not present

## 2022-07-28 DIAGNOSIS — C3492 Malignant neoplasm of unspecified part of left bronchus or lung: Secondary | ICD-10-CM | POA: Diagnosis not present

## 2022-07-28 DIAGNOSIS — Z171 Estrogen receptor negative status [ER-]: Secondary | ICD-10-CM | POA: Diagnosis not present

## 2022-07-28 NOTE — Telephone Encounter (Signed)
I spoke with Lynden Ang about caregiver services. Lynden Ang recommends that they apply for secondary [employer]. With Medicaid, Lynden Ang can apply for personal care services and get some caregiver hours paid for. They aren't any resources that her current insurance will cover. They can apply for Medicaid in [place].

## 2022-07-28 NOTE — Telephone Encounter (Signed)
Per DPR, patient's niece aware and will take patient to apply for Medicaid and get back with [name] if approved.

## 2022-07-29 ENCOUNTER — Encounter: Payer: Self-pay | Admitting: Family Medicine

## 2022-07-31 ENCOUNTER — Inpatient Hospital Stay (HOSPITAL_COMMUNITY)
Admission: EM | Admit: 2022-07-31 | Discharge: 2022-08-09 | DRG: 689 | Disposition: A | Discharge status: Skilled Nursing Facility | Payer: 59 | Attending: Internal Medicine | Admitting: Internal Medicine

## 2022-07-31 ENCOUNTER — Emergency Department (HOSPITAL_COMMUNITY): Payer: 59

## 2022-07-31 ENCOUNTER — Other Ambulatory Visit: Payer: Self-pay

## 2022-07-31 DIAGNOSIS — I3139 Other pericardial effusion (noninflammatory): Secondary | ICD-10-CM | POA: Diagnosis not present

## 2022-07-31 DIAGNOSIS — Z9011 Acquired absence of right breast and nipple: Secondary | ICD-10-CM

## 2022-07-31 DIAGNOSIS — E785 Hyperlipidemia, unspecified: Secondary | ICD-10-CM | POA: Diagnosis present

## 2022-07-31 DIAGNOSIS — C50911 Malignant neoplasm of unspecified site of right female breast: Secondary | ICD-10-CM | POA: Diagnosis not present

## 2022-07-31 DIAGNOSIS — R52 Pain, unspecified: Secondary | ICD-10-CM | POA: Diagnosis not present

## 2022-07-31 DIAGNOSIS — N3000 Acute cystitis without hematuria: Secondary | ICD-10-CM | POA: Diagnosis not present

## 2022-07-31 DIAGNOSIS — R3129 Other microscopic hematuria: Secondary | ICD-10-CM

## 2022-07-31 DIAGNOSIS — R918 Other nonspecific abnormal finding of lung field: Secondary | ICD-10-CM | POA: Diagnosis not present

## 2022-07-31 DIAGNOSIS — G8929 Other chronic pain: Secondary | ICD-10-CM | POA: Diagnosis not present

## 2022-07-31 DIAGNOSIS — Z7189 Other specified counseling: Secondary | ICD-10-CM

## 2022-07-31 DIAGNOSIS — Z9221 Personal history of antineoplastic chemotherapy: Secondary | ICD-10-CM

## 2022-07-31 DIAGNOSIS — Z1152 Encounter for screening for COVID-19: Secondary | ICD-10-CM

## 2022-07-31 DIAGNOSIS — G9340 Encephalopathy, unspecified: Secondary | ICD-10-CM | POA: Diagnosis present

## 2022-07-31 DIAGNOSIS — Z85118 Personal history of other malignant neoplasm of bronchus and lung: Secondary | ICD-10-CM

## 2022-07-31 DIAGNOSIS — Z79899 Other long term (current) drug therapy: Secondary | ICD-10-CM | POA: Diagnosis not present

## 2022-07-31 DIAGNOSIS — R93429 Abnormal radiologic findings on diagnostic imaging of unspecified kidney: Secondary | ICD-10-CM | POA: Diagnosis not present

## 2022-07-31 DIAGNOSIS — R319 Hematuria, unspecified: Secondary | ICD-10-CM | POA: Diagnosis not present

## 2022-07-31 DIAGNOSIS — C7931 Secondary malignant neoplasm of brain: Secondary | ICD-10-CM | POA: Diagnosis not present

## 2022-07-31 DIAGNOSIS — R109 Unspecified abdominal pain: Secondary | ICD-10-CM | POA: Diagnosis not present

## 2022-07-31 DIAGNOSIS — I2510 Atherosclerotic heart disease of native coronary artery without angina pectoris: Secondary | ICD-10-CM | POA: Diagnosis present

## 2022-07-31 DIAGNOSIS — Z515 Encounter for palliative care: Secondary | ICD-10-CM | POA: Diagnosis not present

## 2022-07-31 DIAGNOSIS — I2699 Other pulmonary embolism without acute cor pulmonale: Secondary | ICD-10-CM | POA: Diagnosis present

## 2022-07-31 DIAGNOSIS — Z7982 Long term (current) use of aspirin: Secondary | ICD-10-CM

## 2022-07-31 DIAGNOSIS — E119 Type 2 diabetes mellitus without complications: Secondary | ICD-10-CM | POA: Diagnosis not present

## 2022-07-31 DIAGNOSIS — Z8349 Family history of other endocrine, nutritional and metabolic diseases: Secondary | ICD-10-CM

## 2022-07-31 DIAGNOSIS — N281 Cyst of kidney, acquired: Secondary | ICD-10-CM | POA: Diagnosis not present

## 2022-07-31 DIAGNOSIS — Z833 Family history of diabetes mellitus: Secondary | ICD-10-CM | POA: Diagnosis not present

## 2022-07-31 DIAGNOSIS — Z803 Family history of malignant neoplasm of breast: Secondary | ICD-10-CM

## 2022-07-31 DIAGNOSIS — G936 Cerebral edema: Secondary | ICD-10-CM | POA: Diagnosis not present

## 2022-07-31 DIAGNOSIS — Z823 Family history of stroke: Secondary | ICD-10-CM | POA: Diagnosis not present

## 2022-07-31 DIAGNOSIS — R531 Weakness: Secondary | ICD-10-CM | POA: Diagnosis not present

## 2022-07-31 DIAGNOSIS — N390 Urinary tract infection, site not specified: Principal | ICD-10-CM | POA: Diagnosis present

## 2022-07-31 DIAGNOSIS — Z853 Personal history of malignant neoplasm of breast: Secondary | ICD-10-CM | POA: Diagnosis not present

## 2022-07-31 DIAGNOSIS — C3490 Malignant neoplasm of unspecified part of unspecified bronchus or lung: Secondary | ICD-10-CM | POA: Diagnosis present

## 2022-07-31 DIAGNOSIS — Z7401 Bed confinement status: Secondary | ICD-10-CM | POA: Diagnosis not present

## 2022-07-31 DIAGNOSIS — Z743 Need for continuous supervision: Secondary | ICD-10-CM | POA: Diagnosis not present

## 2022-07-31 DIAGNOSIS — R1311 Dysphagia, oral phase: Secondary | ICD-10-CM | POA: Diagnosis not present

## 2022-07-31 DIAGNOSIS — K7689 Other specified diseases of liver: Secondary | ICD-10-CM | POA: Diagnosis not present

## 2022-07-31 DIAGNOSIS — Z8249 Family history of ischemic heart disease and other diseases of the circulatory system: Secondary | ICD-10-CM

## 2022-07-31 DIAGNOSIS — E876 Hypokalemia: Secondary | ICD-10-CM | POA: Diagnosis not present

## 2022-07-31 DIAGNOSIS — N200 Calculus of kidney: Secondary | ICD-10-CM | POA: Diagnosis not present

## 2022-07-31 DIAGNOSIS — G40909 Epilepsy, unspecified, not intractable, without status epilepticus: Secondary | ICD-10-CM | POA: Diagnosis present

## 2022-07-31 DIAGNOSIS — F05 Delirium due to known physiological condition: Secondary | ICD-10-CM | POA: Diagnosis present

## 2022-07-31 DIAGNOSIS — J90 Pleural effusion, not elsewhere classified: Secondary | ICD-10-CM | POA: Diagnosis not present

## 2022-07-31 DIAGNOSIS — R2689 Other abnormalities of gait and mobility: Secondary | ICD-10-CM | POA: Diagnosis not present

## 2022-07-31 DIAGNOSIS — Z8673 Personal history of transient ischemic attack (TIA), and cerebral infarction without residual deficits: Secondary | ICD-10-CM

## 2022-07-31 DIAGNOSIS — Z923 Personal history of irradiation: Secondary | ICD-10-CM

## 2022-07-31 DIAGNOSIS — I10 Essential (primary) hypertension: Secondary | ICD-10-CM | POA: Diagnosis present

## 2022-07-31 DIAGNOSIS — R220 Localized swelling, mass and lump, head: Secondary | ICD-10-CM | POA: Diagnosis not present

## 2022-07-31 DIAGNOSIS — M6250 Muscle wasting and atrophy, not elsewhere classified, unspecified site: Secondary | ICD-10-CM | POA: Diagnosis not present

## 2022-07-31 DIAGNOSIS — E042 Nontoxic multinodular goiter: Secondary | ICD-10-CM | POA: Diagnosis not present

## 2022-07-31 DIAGNOSIS — K5900 Constipation, unspecified: Secondary | ICD-10-CM | POA: Diagnosis present

## 2022-07-31 DIAGNOSIS — Z8261 Family history of arthritis: Secondary | ICD-10-CM

## 2022-07-31 DIAGNOSIS — R404 Transient alteration of awareness: Secondary | ICD-10-CM | POA: Diagnosis not present

## 2022-07-31 DIAGNOSIS — M6281 Muscle weakness (generalized): Secondary | ICD-10-CM | POA: Diagnosis not present

## 2022-07-31 LAB — COMPREHENSIVE METABOLIC PANEL
ALT: 23 U/L (ref 0–44)
AST: 19 U/L (ref 15–41)
Albumin: 3 g/dL — ABNORMAL LOW (ref 3.5–5.0)
Alkaline Phosphatase: 68 U/L (ref 38–126)
Anion gap: 9 (ref 5–15)
BUN: 9 mg/dL (ref 8–23)
CO2: 23 mmol/L (ref 22–32)
Calcium: 8.6 mg/dL — ABNORMAL LOW (ref 8.9–10.3)
Chloride: 106 mmol/L (ref 98–111)
Creatinine, Ser: 0.53 mg/dL (ref 0.44–1.00)
GFR, Estimated: >60 mL/min (ref >60)
Glucose, Bld: 122 mg/dL — ABNORMAL HIGH (ref 70–99)
Potassium: 3.4 mmol/L — ABNORMAL LOW (ref 3.5–5.1)
Sodium: 138 mmol/L (ref 135–145)
Total Bilirubin: 0.8 mg/dL (ref 0.3–1.2)
Total Protein: 6.3 g/dL — ABNORMAL LOW (ref 6.5–8.1)

## 2022-07-31 LAB — CBC WITH DIFFERENTIAL/PLATELET
Abs Immature Granulocytes: 0.02 10*3/uL (ref 0.00–0.07)
Basophils Absolute: 0 10*3/uL (ref 0.0–0.1)
Basophils Relative: 1 %
Eosinophils Absolute: 0.1 10*3/uL (ref 0.0–0.5)
Eosinophils Relative: 2 %
HCT: 39.3 % (ref 36.0–46.0)
Hemoglobin: 12.7 g/dL (ref 12.0–15.0)
Immature Granulocytes: 0 %
Lymphocytes Relative: 8 %
Lymphs Abs: 0.5 10*3/uL — ABNORMAL LOW (ref 0.7–4.0)
MCH: 31.8 pg (ref 26.0–34.0)
MCHC: 32.3 g/dL (ref 30.0–36.0)
MCV: 98.5 fL (ref 80.0–100.0)
Monocytes Absolute: 0.7 10*3/uL (ref 0.1–1.0)
Monocytes Relative: 12 %
Neutro Abs: 4.1 10*3/uL (ref 1.7–7.7)
Neutrophils Relative %: 77 %
Platelets: 394 10*3/uL (ref 150–400)
RBC: 3.99 MIL/uL (ref 3.87–5.11)
RDW: 14.7 % (ref 11.5–15.5)
WBC: 5.4 10*3/uL (ref 4.0–10.5)
nRBC: 0 % (ref 0.0–0.2)

## 2022-07-31 LAB — URINALYSIS, ROUTINE W REFLEX MICROSCOPIC
Bacteria, UA: NONE SEEN
Bilirubin Urine: NEGATIVE
Glucose, UA: NEGATIVE mg/dL
Ketones, ur: 5 mg/dL — AB
Nitrite: NEGATIVE
Protein, ur: NEGATIVE mg/dL
Specific Gravity, Urine: 1.016 (ref 1.005–1.030)
pH: 5 (ref 5.0–8.0)

## 2022-07-31 LAB — AMMONIA: Ammonia: <10 umol/L (ref 9–35)

## 2022-07-31 LAB — SARS CORONAVIRUS 2 BY RT PCR: SARS Coronavirus 2 by RT PCR: NEGATIVE

## 2022-07-31 MED ORDER — NALOXONE HCL 0.4 MG/ML IJ SOLN: 0.4000 mg | INTRAMUSCULAR | Status: DC | PRN

## 2022-07-31 MED ORDER — DEXAMETHASONE SODIUM PHOSPHATE 10 MG/ML IJ SOLN
10.0000 mg | Freq: Once | INTRAMUSCULAR | Status: AC
  Administered 2022-07-31: 10 mg via INTRAVENOUS
  Filled 2022-07-31: qty 1

## 2022-07-31 MED ORDER — INSULIN ASPART 100 UNIT/ML IJ SOLN
0.0000 [IU] | Freq: Three times a day (TID) | INTRAMUSCULAR | Status: DC
  Administered 2022-08-02: 2 [IU] via SUBCUTANEOUS
  Administered 2022-08-03 – 2022-08-07 (×11): 1 [IU] via SUBCUTANEOUS
  Administered 2022-08-08: 2 [IU] via SUBCUTANEOUS
  Administered 2022-08-08 – 2022-08-09 (×3): 1 [IU] via SUBCUTANEOUS
  Filled 2022-07-31: qty 0.09

## 2022-07-31 MED ORDER — SODIUM CHLORIDE 0.9 % IV BOLUS
1000.0000 mL | Freq: Once | INTRAVENOUS | Status: AC
  Administered 2022-07-31: 1000 mL via INTRAVENOUS

## 2022-07-31 MED ORDER — POTASSIUM CHLORIDE CRYS ER 20 MEQ PO TBCR
40.0000 meq | EXTENDED_RELEASE_TABLET | Freq: Once | ORAL | Status: AC
  Administered 2022-08-01: 40 meq via ORAL
  Filled 2022-07-31: qty 2

## 2022-07-31 MED ORDER — KETOROLAC TROMETHAMINE 30 MG/ML IJ SOLN
30.0000 mg | Freq: Once | INTRAMUSCULAR | Status: AC
  Administered 2022-07-31: 30 mg via INTRAVENOUS
  Filled 2022-07-31: qty 1

## 2022-07-31 MED ORDER — IBUPROFEN 800 MG PO TABS: 800.0000 mg | ORAL_TABLET | Freq: Once | ORAL | Status: DC

## 2022-07-31 MED ORDER — SODIUM CHLORIDE 0.9 % IV SOLN: INTRAVENOUS | Status: DC

## 2022-07-31 MED ORDER — IOHEXOL 300 MG/ML  SOLN
100.0000 mL | Freq: Once | INTRAMUSCULAR | Status: AC | PRN
  Administered 2022-07-31: 100 mL via INTRAVENOUS

## 2022-07-31 MED ORDER — CHLORHEXIDINE GLUCONATE CLOTH 2 % EX PADS
6.0000 | MEDICATED_PAD | Freq: Every day | CUTANEOUS | Status: DC
  Administered 2022-08-01 – 2022-08-09 (×10): 6 via TOPICAL

## 2022-07-31 MED ORDER — HEPARIN SODIUM (PORCINE) 5000 UNIT/ML IJ SOLN
5000.0000 [IU] | Freq: Three times a day (TID) | INTRAMUSCULAR | Status: DC
  Administered 2022-08-01 – 2022-08-04 (×9): 5000 [IU] via SUBCUTANEOUS
  Filled 2022-07-31 (×9): qty 1

## 2022-07-31 MED ORDER — MORPHINE SULFATE (PF) 2 MG/ML IV SOLN
1.0000 mg | INTRAVENOUS | Status: DC | PRN
  Administered 2022-08-01: 1 mg via INTRAVENOUS
  Filled 2022-07-31: qty 1

## 2022-07-31 MED ORDER — SODIUM CHLORIDE 0.9% FLUSH
10.0000 mL | INTRAVENOUS | Status: DC | PRN
  Administered 2022-08-03: 10 mL

## 2022-07-31 MED ORDER — ACETAMINOPHEN 325 MG PO TABS
650.0000 mg | ORAL_TABLET | Freq: Four times a day (QID) | ORAL | Status: DC | PRN
  Administered 2022-08-01 – 2022-08-02 (×3): 650 mg via ORAL
  Filled 2022-07-31 (×3): qty 2

## 2022-07-31 MED ORDER — ACETAMINOPHEN 650 MG RE SUPP: 650.0000 mg | Freq: Four times a day (QID) | RECTAL | Status: DC | PRN

## 2022-07-31 MED ORDER — ATORVASTATIN CALCIUM 40 MG PO TABS
80.0000 mg | ORAL_TABLET | Freq: Every day | ORAL | Status: DC
  Administered 2022-08-01 – 2022-08-08 (×9): 80 mg via ORAL
  Filled 2022-07-31 (×9): qty 2

## 2022-07-31 MED ORDER — LEVETIRACETAM 500 MG PO TABS
500.0000 mg | ORAL_TABLET | Freq: Two times a day (BID) | ORAL | Status: DC
  Administered 2022-08-01 – 2022-08-09 (×18): 500 mg via ORAL
  Filled 2022-07-31 (×12): qty 1
  Filled 2022-07-31: qty 2
  Filled 2022-07-31 (×5): qty 1

## 2022-07-31 MED ORDER — INSULIN ASPART 100 UNIT/ML IJ SOLN
0.0000 [IU] | Freq: Every day | INTRAMUSCULAR | Status: DC
  Filled 2022-07-31: qty 0.05

## 2022-07-31 MED ORDER — ATENOLOL 25 MG PO TABS
12.5000 mg | ORAL_TABLET | Freq: Every day | ORAL | Status: DC
  Administered 2022-08-01 – 2022-08-09 (×9): 12.5 mg via ORAL
  Filled 2022-07-31 (×9): qty 1

## 2022-07-31 MED ORDER — SODIUM CHLORIDE 0.9 % IV SOLN
1.0000 g | INTRAVENOUS | Status: DC
  Administered 2022-08-01 – 2022-08-03 (×4): 1 g via INTRAVENOUS
  Filled 2022-07-31 (×4): qty 10

## 2022-07-31 NOTE — ED Provider Notes (Signed)
Physical Exam  BP 131/77   Pulse (!) 106   Temp 98.5 F (36.9 C) (Oral)   Resp (!) 24   Ht 5\' 8"  (1.727 m)   Wt 71.7 kg   SpO2 93%   BMI 24.02 kg/m   Physical Exam Vitals and nursing note reviewed.  Constitutional:      General: She is not in acute distress.    Appearance: Normal appearance.  HENT:     Head: Atraumatic.  Cardiovascular:     Rate and Rhythm: Regular rhythm. Tachycardia present.  Pulmonary:     Effort: Pulmonary effort is normal.     Breath sounds: Normal breath sounds.  Skin:    General: Skin is warm and dry.  Neurological:     General: No focal deficit present.     Mental Status: She is alert.  Psychiatric:        Mood and Affect: Mood normal.        Behavior: Behavior normal.     Procedures  Procedures  ED Course / MDM    Medical Decision Making Amount and/or Complexity of Data Reviewed Labs: ordered. Radiology: ordered.  Risk OTC drugs. Prescription drug management. Decision regarding hospitalization.   77 y.o. female with pertinent past medical history of lung cancer with metastasis to the brain, recent mastectomy, hypertension, type 2 diabetes, stroke presents to the ED for concern of worsening weakness as of last 2 weeks  Differential diagnosis includes but is not limited to electrolyte abnormality, poor p.o. intake, cancer, UTI  ED Course:  Patient handed off to me by Barnie Alderman PA-C. With plan to follow up CT head and re-evaluate patient to determine dispo.   Patient denies any dysuria or gross hematuria, however urinalysis showed leukocytes and red blood cells that are new compared to previous urinalysis.  Due to history of cancer will obtain CT abdomen pelvis to further evaluate. Patient had small pleural effusion noted on CXR, suspect malignant pleural effusion. She is asymptomatic. No difficulty breathing, cough, shortness of breath. Stating well on room air consistent with baseline.  She has slight tachycardia here today  however this is consistent with her baseline, she has seen her PCP regarding this.  CT scan of the head is unchanged from baseline.  CT abdomen pelvis shows stones in the right and left kidney poles, this is likely explanation for her hematuria.  Trace pericardial effusion and trace left pleural effusion noted on CT abdomen pelvis. Upon re-evaluation, patient resting comfortably in bed. She has been given toradol for pain.  Patient given 10mg  Decadron as she was recently on decadron taper for her cancer which correlated with her increased weakness.   Impression: Weakness  Disposition:  Admitted. The case was discussed with the admitting physician Dr. Loney Loh   Lab Tests: I Ordered, and personally interpreted labs.  The pertinent results include:   CBC with no leukocytosis, hemoglobin within normal limits CMP with no concerning findings Ammonia within normal limits Urinalysis with moderate amount of leukocytes and white blood cells, red blood cells COVID-negative  Imaging Studies ordered: I ordered imaging studies including chest x-ray, CT head, CT abdomen pelvis I independently visualized the imaging with scope of interpretation limited to determining acute life threatening conditions related to emergency care.  X-ray shows small pleural effusion in left lobe CT head shows no significant change from previous CT AP with stones in the right and left kidney poles, trace pericardial effusion and trace left pleural effusion I agree with the radiologist  interpretation   Cardiac Monitoring: / EKG: The patient was maintained on a cardiac monitor.  I personally viewed and interpreted the cardiac monitored which showed an underlying rhythm of: NSR   Consultations Obtained: I requested consultation with the, Oncologist Dr. Leonia Corona and discussed lab and imaging findings as well as pertinent plan - they recommend: Admission to continue 4mg  of Decadron I requested consultation with the hospitalist  Dr. Loney Loh  and discussed lab and imaging findings as well as pertinent plan - they recommend: consulting urology to make sure the stone may not be causing infection. I requested consultation with the urologist Dr, Marlou Porch,  and discussed lab and imaging findings as well as pertinent plan - they recommend: they do not need to treat nephrolithiasis seen on CT as there is no hydronephrosis, white count, fevers concerning for infection at this time.   External records from outside source obtained and reviewed including recent visit with her primary care provider on 7/10 she noted she had elevated heart rate and fatigue.   Co morbidities that complicate the patient evaluation  lung cancer with metastasis to the brain, recent mastectomy, hypertension, type 2 diabetes, stroke  Social Determinants of Health:  Unknown             Arabella Merles, PA-C 07/31/22 2317    Vanetta Mulders, MD 08/08/22 [phone]

## 2022-07-31 NOTE — H&P (Signed)
History and Physical    Jennifer Cowan ZDG:[id number] DOB: 1945-02-03 DOA: 07/31/2022  PCP: Jennifer Earing, FNP  Patient coming from: Home  Chief Complaint: Generalized weakness  HPI: Jennifer Cowan is a 77 y.o. 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  In the ED, patient was slightly tachycardic but afebrile.  Not hypotensive.  Labs showing no leukocytosis or anemia, potassium 3.4, creatinine 0.5, ammonia level <10, SARS-CoV-2 PCR negative.  UA showing negative nitrite, moderate leukocytes, and microscopy showing 6-10 RBCs, 11-20 WBCs, and no bacteria.  Chest x-ray showing small left pleural effusion.     CT head without contrast showing: "IMPRESSION: No significant change in extensive vasogenic edema throughout the right temporal and parietal lobes, and involving the right thalamus and basal ganglia. Mild right-to-left midline shift measuring 5 mm also shows no significant change.   No evidence of intracranial hemorrhage."   CT abdomen pelvis with and without contrast showing: "IMPRESSION: 1. Substantial scarring in the right kidney upper pole along with a 2.2 cm staghorn calculus. 2. 1.0 cm left kidney lower pole stone. 3. 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 4. Right breast collection with cutaneous thickening in the right medial breast, and stranding in the soft tissues of the right chest wall lateral to the collection. 5.  Trace pericardial effusion and trace left pleural effusion. 6. Aortic and coronary atherosclerosis. Mitral and aortic valve calcifications. 7. Multiple uterine fibroids. 8. Lumbar spondylosis and degenerative disc disease with resulting bilateral foraminal impingement at L4-5.   Aortic Atherosclerosis (ICD10-I70.0)."   ED PA discussed with Dr. Mosetta Cowan, recommended starting steroids and oncology consult in the morning.  Case was also discussed with Dr. Marlou Cowan with urology who felt that since there is no evidence of hydronephrosis, fever, or leukocytosis, no treatment needed for the kidney stones at this time and they are less likely to be the source of infection.  Patient received IV Decadron 10 mg, Toradol, and 1 L normal saline bolus in the ED.   Patient appears slightly confused.  History provided mostly by her niece Jennifer Cowan at bedside who states patient has been very weak since her mastectomy surgery a month ago.  She is barely able to get out of bed and walk.  She is eating food but her appetite is poor.  Also been confused and complaining of pain all over but recently started endorsing right-sided abdominal/flank pain and pain/burning with urination.  No fevers.  She coughs occasionally, no shortness of breath or chest pain.  No vomiting or diarrhea.  Niece states patient is receiving immunotherapy for her cancer every 3 weeks.  She used to be on steroids for a long time which were tapered off by her oncologist a week ago.  Review of Systems:  Review of Systems  All other systems reviewed and are negative.   Past Medical History:  Diagnosis Date   Anemia    Arthritis    Breast cancer (HCC)    Cancer (HCC) 1999   Breast Cancer   Family history of breast cancer  Hyperlipidemia    Hypertension    Osteopenia 01/12/2021   Personal history of malignant neoplasm of breast     Past Surgical History:  Procedure Laterality Date   BREAST SURGERY  1999   Masectomy- left   BRONCHIAL BIOPSY   07/30/2021   Procedure: BRONCHIAL BIOPSIES;  Surgeon: Leslye Peer, MD;  Location: [facility] ENDOSCOPY;  Service: Pulmonary;;   BRONCHIAL BRUSHINGS  07/30/2021   Procedure: BRONCHIAL BRUSHINGS;  Surgeon: Leslye Peer, MD;  Location: [facility] ENDOSCOPY;  Service: Pulmonary;;   BRONCHIAL NEEDLE ASPIRATION BIOPSY  07/30/2021   Procedure: BRONCHIAL NEEDLE ASPIRATION BIOPSIES;  Surgeon: Leslye Peer, MD;  Location: [facility] ENDOSCOPY;  Service: Pulmonary;;   CYSTECTOMY     cyst removed off top of head   IR IMAGING GUIDED PORT INSERTION  08/20/2021   MASTECTOMY     MASTECTOMY W/ SENTINEL NODE BIOPSY Right 06/27/2022   Procedure: RIGHT MASTECTOMY WITH SENTINEL LYMPH NODE BIOPSY;  Surgeon: Almond Lint, MD;  Location: [facility] OR;  Service: General;  Laterality: Right;   VIDEO BRONCHOSCOPY WITH RADIAL ENDOBRONCHIAL ULTRASOUND  07/30/2021   Procedure: VIDEO BRONCHOSCOPY WITH RADIAL ENDOBRONCHIAL ULTRASOUND;  Surgeon: Leslye Peer, MD;  Location: [facility] ENDOSCOPY;  Service: Pulmonary;;     reports that she has never smoked. She has never used smokeless tobacco. She reports that she does not drink alcohol and does not use drugs.  No Known Allergies  Family History  Problem Relation Age of Onset   Arthritis Mother    Breast cancer Sister 38   Diabetes Sister    Hyperlipidemia Sister    Hypertension Sister    Stroke Sister    Dementia Sister    Diabetes Brother    Hypertension Brother    Breast cancer Niece 68    Prior to Admission medications   Medication Sig Start Date End Date Taking? Authorizing Provider  acetaminophen (TYLENOL) 500 MG tablet Take 1,000 mg by mouth every 6 (six) hours as needed (for back pain).   Yes [provider]  aspirin (EQ ASPIRIN) 325 MG tablet Take 1 tablet (325 mg total) by mouth daily. 06/30/22  Yes Almond Lint, MD  atenolol (TENORMIN) 25 MG tablet Take 0.5 tablets (12.5 mg total) by mouth daily. 07/27/22  Yes Jennifer Earing, FNP  atorvastatin (LIPITOR) 80 MG tablet  Take 1 tablet (80 mg total) by mouth daily. Patient taking differently: Take 80 mg by mouth at bedtime. 10/08/21  Yes Jennifer Earing, FNP  Cholecalciferol (VITAMIN D3) 125 MCG (5000 UT) CAPS Take 5,000 Units by mouth daily.   Yes [provider]  ferrous sulfate 325 (65 FE) MG tablet Take 325 mg by mouth daily with breakfast.   Yes [provider]  gabapentin (NEURONTIN) 100 MG capsule Take 3 capsules (300 mg total) by mouth 2 (two) times daily. Patient taking differently: Take 100 mg by mouth daily as needed (for nerve pain). 06/20/22  Yes Iruku, Burnice Logan, MD  ketoconazole (NIZORAL) 2 % cream Apply 1 Application topically See admin instructions. Apply to both feet 2 times a day 03/15/22  Yes [provider]  lidocaine-prilocaine (EMLA) cream Apply 1 Application topically as needed [facility] access).   Yes [provider]  meloxicam (MOBIC) 7.5 MG tablet TAKE 1 TABLET BY MOUTH DAILY Patient taking differently: Take 7.5 mg by mouth daily. 07/08/22  Yes Hawks, Christy A, FNP  spironolactone (ALDACTONE) 25 MG tablet Take 0.5 tablets (12.5 mg total) by mouth daily. 03/21/22  Yes Bensimhon, Bevelyn Buckles,  MD  Accu-Chek Softclix Lancets lancets Use to check blood sugar as directed 4 times a day 02/14/22   Pickenpack-Cousar, Arty Baumgartner, NP  amLODipine (NORVASC) 10 MG tablet TAKE 1 TABLET BY MOUTH DAILY Patient not taking: Reported on 07/31/2022 07/08/22   Jennifer Earing, FNP  benazepril (LOTENSIN) 10 MG tablet TAKE 1 TABLET BY MOUTH DAILY Patient taking differently: Take 10 mg by mouth daily. 07/08/22   Jennifer Earing, FNP  blood glucose meter kit and supplies KIT Dispense based on patient and insurance preference. Use up to four times daily as directed. 08/31/21   Loa Socks, NP  Blood Glucose Monitoring Suppl (ACCU-CHEK GUIDE) w/Device KIT Use as directed 4 times a day 08/31/21   Loa Socks, NP  glucose blood (ACCU-CHEK GUIDE) test strip Use as directed 4  times a day 01/26/22   Pickenpack-Cousar, Arty Baumgartner, NP  levETIRAcetam (KEPPRA) 500 MG tablet Take 1 tablet (500 mg total) by mouth 2 (two) times daily. 06/12/22 07/31/22  Loetta Rough, MD  ondansetron (ZOFRAN) 8 MG tablet Take 1 tablet (8 mg total) by mouth every 8 (eight) hours as needed for nausea or vomiting. Patient not taking: Reported on 07/31/2022 07/11/22   Rachel Moulds, MD  oxyCODONE (OXY IR/ROXICODONE) 5 MG immediate release tablet Take 1-2 tablets (5-10 mg total) by mouth every 4 (four) hours as needed for moderate pain. Patient not taking: Reported on 07/31/2022 06/29/22   Almond Lint, MD  prochlorperazine (COMPAZINE) 10 MG tablet Take 1 tablet (10 mg total) by mouth every 6 (six) hours as needed for nausea or vomiting. Patient not taking: Reported on 07/31/2022 07/11/22   Rachel Moulds, MD  simvastatin (ZOCOR) 40 MG tablet TAKE 1 TABLET BY MOUTH DAILY 07/08/22   [name] A, FNP  traMADol (ULTRAM) 50 MG tablet Take 1 tablet (50 mg total) by mouth every 6 (six) hours as needed for moderate pain (mild pain). Patient not taking: Reported on 07/31/2022 06/29/22   Almond Lint, MD    Physical Exam: Vitals:   07/31/22 1849 07/31/22 1900 07/31/22 2100 07/31/22 2200  BP: 122/76 122/78 127/82   Pulse: (!) 105 (!) 103 (!) 101   Resp: 18  (!) 24   Temp:    98.3 F (36.8 C)  TempSrc:      SpO2: 91% 93% 92%   Weight:      Height:        Physical Exam Vitals reviewed.  Constitutional:      General: She is not in acute distress. HENT:     Head: Normocephalic and atraumatic.  Eyes:     Extraocular Movements: Extraocular movements intact.  Cardiovascular:     Rate and Rhythm: Normal rate and regular rhythm.     Pulses: Normal pulses.  Pulmonary:     Effort: Pulmonary effort is normal. No respiratory distress.     Breath sounds: Normal breath sounds. No wheezing or rales.  Abdominal:     General: Bowel sounds are normal. There is no distension.     Palpations: Abdomen is  soft.     Tenderness: There is no abdominal tenderness.  Musculoskeletal:     Cervical back: Normal range of motion.     Right lower leg: No edema.     Left lower leg: No edema.  Skin:    General: Skin is warm and dry.  Neurological:     General: No focal deficit present.     Mental Status: She is alert.  Cranial Nerves: No cranial nerve deficit.     Sensory: No sensory deficit.     Motor: No weakness.     Comments: Oriented to person and place.  She knows the year is 2024 but does not remember the month. Following commands appropriately.     Labs on Admission: I have personally reviewed following labs and imaging studies  CBC: Recent Labs  Lab 07/31/22 1330  WBC 5.4  NEUTROABS 4.1  HGB 12.7  HCT 39.3  MCV 98.5  PLT 394   Basic Metabolic Panel: Recent Labs  Lab 07/31/22 1330  NA 138  K 3.4*  CL 106  CO2 23  GLUCOSE 122*  BUN 9  CREATININE 0.53  CALCIUM 8.6*   GFR: Estimated Creatinine Clearance: 60.4 mL/min (by C-G formula based on SCr of 0.53 mg/dL). Liver Function Tests: Recent Labs  Lab 07/31/22 1330  AST 19  ALT 23  ALKPHOS 68  BILITOT 0.8  PROT 6.3*  ALBUMIN 3.0*   No results for input(s): "LIPASE", "AMYLASE" in the last 168 hours. Recent Labs  Lab 07/31/22 1347  AMMONIA <10   Coagulation Profile: No results for input(s): "INR", "PROTIME" in the last 168 hours. Cardiac Enzymes: No results for input(s): "CKTOTAL", "CKMB", "CKMBINDEX", "TROPONINI" in the last 168 hours. BNP (last 3 results) No results for input(s): "PROBNP" in the last 8760 hours. HbA1C: No results for input(s): "HGBA1C" in the last 72 hours. CBG: No results for input(s): "GLUCAP" in the last 168 hours. Lipid Profile: No results for input(s): "CHOL", "HDL", "LDLCALC", "TRIG", "CHOLHDL", "LDLDIRECT" in the last 72 hours. Thyroid Function Tests: No results for input(s): "TSH", "T4TOTAL", "FREET4", "T3FREE", "THYROIDAB" in the last 72 hours. Anemia Panel: No results  for input(s): "VITAMINB12", "FOLATE", "FERRITIN", "TIBC", "IRON", "RETICCTPCT" in the last 72 hours. Urine analysis:    Component Value Date/Time   COLORURINE YELLOW 07/31/2022 1426   APPEARANCEUR HAZY (A) 07/31/2022 1426   LABSPEC 1.016 07/31/2022 1426   PHURINE 5.0 07/31/2022 1426   GLUCOSEU NEGATIVE 07/31/2022 1426   HGBUR SMALL (A) 07/31/2022 1426   BILIRUBINUR NEGATIVE 07/31/2022 1426   BILIRUBINUR small (A) 07/24/2021 1047   KETONESUR 5 (A) 07/31/2022 1426   PROTEINUR NEGATIVE 07/31/2022 1426   UROBILINOGEN 1.0 07/24/2021 1047   NITRITE NEGATIVE 07/31/2022 1426   LEUKOCYTESUR MODERATE (A) 07/31/2022 1426    Radiological Exams on Admission: CT ABDOMEN PELVIS W WO CONTRAST  Result Date: 07/31/2022 CLINICAL DATA:  Microscopic hematuria, metastatic lung cancer and right breast cancer * Tracking Code: BO * EXAM: CT ABDOMEN AND PELVIS WITHOUT AND WITH CONTRAST TECHNIQUE: Multidetector CT imaging of the abdomen and pelvis was performed following the standard protocol before and following the bolus administration of intravenous contrast. RADIATION DOSE REDUCTION: This exam was performed according to the departmental dose-optimization program which includes automated exposure control, adjustment of the mA and/or kV according to patient size and/or use of iterative reconstruction technique. CONTRAST:  OMNIPAQUE IOHEXOL 300 MG/ML  SOLN COMPARISON:  PET-CT 05/05/2022 FINDINGS: Despite efforts by the technologist and patient, motion artifact is present on today's exam and could not be eliminated. This reduces exam sensitivity and specificity. Lower chest: Mild atelectasis posteriorly in the left lower lobe. Right breast collection with cutaneous thickening in the right medial breast, and stranding in the soft tissues of the right chest wall lateral to the collection. Left anterior descending and right coronary artery atherosclerosis. Mitral and aortic valve calcifications. Trace pericardial  effusion. Trace left pleural effusion. Hepatobiliary: Simple caudate lobe  cyst 5.2 by 4.6 cm. No further imaging workup of this lesion is indicated. Similar simple cysts in segment 3 of the liver. No further imaging workup of these lesions is indicated. Mildly contracted gallbladder. Pancreas: Unremarkable Spleen: Unremarkable Adrenals/Urinary Tract: Adrenal glands normal. Substantial scarring in the right kidney upper pole along with a 2.2 cm staghorn calculus. Benign right kidney lower pole cyst 1.7 cm in diameter on image 26 series 19. No further imaging workup of this lesion is indicated. 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 1.0 cm left kidney lower pole stone on image 32 series 13. Urinary bladder unremarkable. No significant abnormal filling defect or abnormal enhancement along the urothelium. Stomach/Bowel: Unremarkable Vascular/Lymphatic: Atherosclerosis is present, including aortoiliac atherosclerotic disease. Reproductive: Multiple uterine fibroids are again observed. Other: No supplemental non-categorized findings. Musculoskeletal: Spurring along the femoral necks and femoral heads bilaterally. Lumbar spondylosis and degenerative disc disease with resulting bilateral foraminal impingement at L4-5. IMPRESSION: 1. Substantial scarring in the right kidney upper pole along with a 2.2 cm staghorn calculus. 2. 1.0 cm left kidney lower pole stone. 3. 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 4. Right breast collection with cutaneous thickening in the right medial breast, and stranding in the soft tissues of the right chest wall lateral to the collection. 5. Trace pericardial effusion and trace left pleural effusion. 6. Aortic and coronary atherosclerosis. Mitral and aortic valve calcifications. 7. Multiple uterine fibroids. 8. Lumbar spondylosis and degenerative disc disease with resulting bilateral  foraminal impingement at L4-5. Aortic Atherosclerosis (ICD10-I70.0). Electronically Signed   By: Gaylyn Rong M.D.   On: 07/31/2022 17:17   CT Head Wo Contrast  Result Date: 07/31/2022 CLINICAL DATA:  Altered mental status.  Metastatic lung carcinoma EXAM: CT HEAD WITHOUT CONTRAST TECHNIQUE: Contiguous axial images were obtained from the base of the skull through the vertex without intravenous contrast. RADIATION DOSE REDUCTION: This exam was performed according to the departmental dose-optimization program which includes automated exposure control, adjustment of the mA and/or kV according to patient size and/or use of iterative reconstruction technique. COMPARISON:  CT on 06/11/2022 FINDINGS: Brain: No evidence of intracranial hemorrhage. Extensive vasogenic edema throughout the right temporal and parietal lobes and involving the right thalamus and basal ganglia shows no significant change. Mass effect on the right lateral and mild midline shift measuring 5 mm also shows no significant change. Mild chronic small vessel disease again noted. Vascular:  No hyperdense vessel or other acute findings. Skull: No evidence of fracture or other significant bone abnormality. Sinuses/Orbits:  No acute findings. Other: None. IMPRESSION: No significant change in extensive vasogenic edema throughout the right temporal and parietal lobes, and involving the right thalamus and basal ganglia. Mild right-to-left midline shift measuring 5 mm also shows no significant change. No evidence of intracranial hemorrhage. Electronically Signed   By: Danae Orleans M.D.   On: 07/31/2022 16:55   DG CHEST PORT 1 VIEW  Result Date: 07/31/2022 CLINICAL DATA:  Worsening altered mental status. History of metastatic lung cancer. EXAM: PORTABLE CHEST 1 VIEW COMPARISON:  July 30, 2021 FINDINGS: Injectable port terminates at the cavoatrial junction. Normal cardiac silhouette. Small left pleural effusion. Post treatment changes in the left  upper lobe. The right lung is clear. IMPRESSION: 1. Small left pleural effusion. 2. Post treatment changes in the left upper lobe. Electronically Signed   By: Ted Mcalpine M.D.   On: 07/31/2022  14:38    EKG: Pending at this time.  Assessment and Plan  Possible UTI Renal calculi Microscopic hematuria Patient is endorsing right-sided abdominal/flank pain and dysuria.  CT showing substantial scarring in the right kidney upper pole along with a 2.2 cm staghorn calculus.  Also showing a 1 cm left kidney lower pole stone. UA showing negative nitrite, moderate leukocytes, and microscopy showing 6-10 RBCs, 11-20 WBCs, and no bacteria.  Slight tachycardia has improved with IV fluids.  No fever, leukocytosis, or hypotension.  No signs of sepsis at this time.  Labs not indicative of acute kidney injury.  ED PA had discussed with Dr. Marlou Cowan with urology who felt that since there is no evidence of hydronephrosis, fever, or leukocytosis, no treatment needed for the kidney stones at this time and they are less likely to be the source of infection.  Will order urine and blood cultures.  Start ceftriaxone, gentle IV fluid hydration, pain management.  Acute encephalopathy Lung cancer with brain metastases Breast cancer UTI possibly causing her confusion.  CT head showing stable extensive vasogenic edema and stable mild right to left midline shift measuring 5 mm.  No evidence of intracranial hemorrhage on CT.  Patient was given IV Decadron 10 mg in the ED.  Continue Keppra.  Oncology will consult in the morning.  Generalized weakness In the setting of known cancer and possible UTI.  Also she was previously on steroids which were tapered off a week ago which could be contributing.  PT/OT eval, fall precautions.  Mild hypokalemia Likely due to poor p.o. intake.  Monitor potassium and magnesium levels, continue to replace as needed.  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  Patient had  moderate pericardial effusion on echo done in February 2024 which had essentially resolved on repeat echo done in April.  CT done today showing trace pericardial effusion.  Left pleural effusion Patient had tiny left pleural effusion on CT back in February 2024 and no thoracentesis was performed.  CT done today showing trace left pleural effusion.  No respiratory distress or hypoxia.  Benign angiomyolipoma of the left kidney Seen on CT.  Hyperlipidemia Continue Lipitor.  Hypertension Blood pressure stable.  Continue atenolol.  Diet controlled type 2 diabetes A1c 5.9 in June 2023, will repeat.  Order sensitive sliding scale insulin.  DVT prophylaxis: SQ Heparin Code Status: Full Code (discussed with the patient's niece) Family Communication: Patient's niece Jennifer Cowan at bedside. Level of care: Telemetry bed Admission status: It is my clinical opinion that admission to INPATIENT is reasonable and necessary because of the expectation that this patient will require hospital care that crosses at least 2 midnights to treat this condition based on the medical complexity of the problems presented.  Given the aforementioned information, the predictability of an adverse outcome is felt to be significant.   John Giovanni MD [facility]  If 7PM-7AM, please contact night-coverage [email or web address]  07/31/2022, 10:39 PM

## 2022-07-31 NOTE — ED Triage Notes (Signed)
Pt arrives from home via pt's niece who reports patient has had x2 weeks of worsening AMS and weakness. Having difficulty following commands and decreased appetite. Hx of lung cancer with METS to her brain.

## 2022-07-31 NOTE — ED Provider Notes (Signed)
[place] [facility] AT [facility] Provider Note   CSN: [id number] Arrival date & time: 07/31/22  1302     History  Chief Complaint  Patient presents with   Weakness    Jennifer Cowan is a 77 y.o. female, history of metastatic breast cancer, to the brain, who presents to the ED secondary to worsening weakness, and allover pain for the last 2 weeks.  Most of history is obtained via niece, at bedside.  Niece states that for the last couple weeks, patient has gotten a lot weaker, and has not been wanting to walk around or do anything.  States that she is very difficult at have her follow directions, and now she screams when you touch her.  States that she has noticed a change in her since she had her mastectomy a couple weeks ago.  Has just been very fatigued, and is now not hardly eating.  Patient is currently undergoing immunotherapy, and has had radiation to her brain.  No recent falls.  No shortness of breath chest pain, abdominal pain, urinary symptoms.  Patient would like to be a full code.     Home Medications Prior to Admission medications   Medication Sig Start Date End Date Taking? Authorizing Provider  Accu-Chek Softclix Lancets lancets Use to check blood sugar as directed 4 times a day 02/14/22   Pickenpack-Cousar, Arty Baumgartner, NP  acetaminophen (TYLENOL) 500 MG tablet Take 1,000 mg by mouth every 6 (six) hours as needed for moderate pain.    [provider]  amLODipine (NORVASC) 10 MG tablet TAKE 1 TABLET BY MOUTH DAILY 07/08/22   [name], FNP  aspirin (EQ ASPIRIN) 325 MG tablet Take 1 tablet (325 mg total) by mouth daily. 06/30/22   Almond Lint, MD  atenolol (TENORMIN) 25 MG tablet Take 0.5 tablets (12.5 mg total) by mouth daily. 07/27/22   [name], FNP  atorvastatin (LIPITOR) 80 MG tablet Take 1 tablet (80 mg total) by mouth daily. 10/08/21   [name], FNP  benazepril (LOTENSIN) 10 MG tablet TAKE 1 TABLET BY MOUTH DAILY  07/08/22   [name], FNP  blood glucose meter kit and supplies KIT Dispense based on patient and insurance preference. Use up to four times daily as directed. 08/31/21   Loa Socks, NP  Blood Glucose Monitoring Suppl (ACCU-CHEK GUIDE) w/Device KIT Use as directed 4 times a day 08/31/21   Loa Socks, NP  Cholecalciferol (VITAMIN D3) 125 MCG (5000 UT) CAPS Take 5,000 Units by mouth daily.    [provider]  ferrous sulfate 325 (65 FE) MG tablet Take 325 mg by mouth daily with breakfast.    [provider]  gabapentin (NEURONTIN) 100 MG capsule Take 3 capsules (300 mg total) by mouth 2 (two) times daily. Patient taking differently: Take 300 mg by mouth 3 (three) times daily. 06/20/22   Rachel Moulds, MD  glucose blood (ACCU-CHEK GUIDE) test strip Use as directed 4 times a day 01/26/22   Pickenpack-Cousar, Arty Baumgartner, NP  ketoconazole (NIZORAL) 2 % cream Apply 1 Application topically 2 (two) times daily. 03/15/22   [provider]  levETIRAcetam (KEPPRA) 500 MG tablet Take 1 tablet (500 mg total) by mouth 2 (two) times daily. 06/12/22 07/31/22  Loetta Rough, MD  lidocaine-prilocaine (EMLA) cream Apply 1 Application topically as needed [facility] access).    [provider]  meloxicam (MOBIC) 7.5 MG tablet TAKE 1 TABLET BY MOUTH DAILY 07/08/22  Hawks, Christy A, FNP  ondansetron (ZOFRAN) 8 MG tablet Take 1 tablet (8 mg total) by mouth every 8 (eight) hours as needed for nausea or vomiting. 07/11/22   Rachel Moulds, MD  oxyCODONE (OXY IR/ROXICODONE) 5 MG immediate release tablet Take 1-2 tablets (5-10 mg total) by mouth every 4 (four) hours as needed for moderate pain. 06/29/22   Almond Lint, MD  prochlorperazine (COMPAZINE) 10 MG tablet Take 1 tablet (10 mg total) by mouth every 6 (six) hours as needed for nausea or vomiting. 07/11/22   Rachel Moulds, MD  simvastatin (ZOCOR) 40 MG tablet TAKE 1 TABLET BY MOUTH DAILY 07/08/22   [name]  A, FNP  spironolactone (ALDACTONE) 25 MG tablet Take 0.5 tablets (12.5 mg total) by mouth daily. 03/21/22   Bensimhon, Bevelyn Buckles, MD  traMADol (ULTRAM) 50 MG tablet Take 1 tablet (50 mg total) by mouth every 6 (six) hours as needed for moderate pain (mild pain). 06/29/22   Almond Lint, MD      Allergies    Patient has no known allergies.    Review of Systems   Review of Systems  Respiratory:  Negative for shortness of breath.   Neurological:  Positive for weakness.    Physical Exam Updated Vital Signs BP 131/77   Pulse (!) 106   Temp 98.5 F (36.9 C) (Oral)   Resp (!) 24   Ht 5\' 8"  (1.727 m)   Wt 71.7 kg   SpO2 93%   BMI 24.02 kg/m  Physical Exam Vitals and nursing note reviewed.  Constitutional:      General: She is not in acute distress.    Appearance: She is well-developed.  HENT:     Head: Normocephalic and atraumatic.  Eyes:     Conjunctiva/sclera: Conjunctivae normal.  Cardiovascular:     Rate and Rhythm: Normal rate and regular rhythm.     Heart sounds: No murmur heard. Pulmonary:     Effort: Pulmonary effort is normal. No respiratory distress.     Breath sounds: Normal breath sounds.  Abdominal:     Palpations: Abdomen is soft.     Tenderness: There is no abdominal tenderness.  Musculoskeletal:        General: No swelling.     Cervical back: Neck supple.     Comments: 4/5 strength of BUE and BLE  Skin:    General: Skin is warm and dry.     Capillary Refill: Capillary refill takes less than 2 seconds.  Neurological:     Mental Status: She is alert and oriented to person, place, and time.  Psychiatric:        Mood and Affect: Mood normal.     ED Results / Procedures / Treatments   Labs (all labs ordered are listed, but only abnormal results are displayed) Labs Reviewed  CBC WITH DIFFERENTIAL/PLATELET - Abnormal; Notable for the following components:      Result Value   Lymphs Abs 0.5 (*)    All other components within normal limits  COMPREHENSIVE  METABOLIC PANEL - Abnormal; Notable for the following components:   Potassium 3.4 (*)    Glucose, Bld 122 (*)    Calcium 8.6 (*)    Total Protein 6.3 (*)    Albumin 3.0 (*)    All other components within normal limits  URINALYSIS, ROUTINE W REFLEX MICROSCOPIC - Abnormal; Notable for the following components:   APPearance HAZY (*)    Hgb urine dipstick Jahmal Dunavant (*)    Ketones, ur  5 (*)    Leukocytes,Ua MODERATE (*)    All other components within normal limits  SARS CORONAVIRUS 2 BY RT PCR  AMMONIA    EKG None  Radiology DG CHEST PORT 1 VIEW  Result Date: 07/31/2022 CLINICAL DATA:  Worsening altered mental status. History of metastatic lung cancer. EXAM: PORTABLE CHEST 1 VIEW COMPARISON:  July 30, 2021 FINDINGS: Injectable port terminates at the cavoatrial junction. Normal cardiac silhouette. Onesimo Lingard left pleural effusion. Post treatment changes in the left upper lobe. The right lung is clear. IMPRESSION: 1. Chayanne Speir left pleural effusion. 2. Post treatment changes in the left upper lobe. Electronically Signed   By: Ted Mcalpine M.D.   On: 07/31/2022 14:38    Procedures Procedures    Medications Ordered in ED Medications  sodium chloride 0.9 % bolus 1,000 mL (1,000 mLs Intravenous New Bag/Given 07/31/22 1356)    ED Course/ Medical Decision Making/ A&P                             Medical Decision Making Patient is a 77 year old female, here for increasing weakness, reduced p.o. intake for the last couple weeks, this all occurred after mastectomy.  Will obtain head CT, blood work, for further evaluation.  She has no focal deficits on exam.  I just 4 out of 5 strength of bilateral upper and bilateral lower extremities.  Per niece, they would like full care, and full code.  She is under her going immunotherapy for her metastatic breast cancer and has had radiation to the brain in the past  Amount and/or Complexity of Data Reviewed Labs: ordered.    Details: Urinalysis fairly  unremarkable, CBC within normal limits, mild hypokalemia at 3.4 Radiology: ordered.    Details: Chest x-ray shows Marua Qin pleural effusion Discussion of management or test interpretation with external provider(s): Patient is a 77 year old female, here for increased fatigue over the last couple weeks after mastectomy.  Labs so far are reassuring.  CT head pending at this time, as well as EKG.  Signed out to [facility], she will follow-up on results, disposition as appropriately.    Final Clinical Impression(s) / ED Diagnoses Final diagnoses:  None    Rx / DC Orders ED Discharge Orders     None         Julien Oscar, Harley Alto, PA 07/31/22 1514    Glendora Score, MD 08/01/22 1110

## 2022-08-01 DIAGNOSIS — N3000 Acute cystitis without hematuria: Secondary | ICD-10-CM

## 2022-08-01 DIAGNOSIS — R531 Weakness: Secondary | ICD-10-CM | POA: Diagnosis not present

## 2022-08-01 LAB — CBC
HCT: 37.9 % (ref 36.0–46.0)
Hemoglobin: 12 g/dL (ref 12.0–15.0)
MCH: 31.3 pg (ref 26.0–34.0)
MCHC: 31.7 g/dL (ref 30.0–36.0)
MCV: 99 fL (ref 80.0–100.0)
Platelets: 395 10*3/uL (ref 150–400)
RBC: 3.83 MIL/uL — ABNORMAL LOW (ref 3.87–5.11)
RDW: 14.7 % (ref 11.5–15.5)
WBC: 5.4 10*3/uL (ref 4.0–10.5)
nRBC: 0 % (ref 0.0–0.2)

## 2022-08-01 LAB — GLUCOSE, CAPILLARY
Glucose-Capillary: 109 mg/dL — ABNORMAL HIGH (ref 70–99)
Glucose-Capillary: 120 mg/dL — ABNORMAL HIGH (ref 70–99)
Glucose-Capillary: 125 mg/dL — ABNORMAL HIGH (ref 70–99)
Glucose-Capillary: 144 mg/dL — ABNORMAL HIGH (ref 70–99)
Glucose-Capillary: 147 mg/dL — ABNORMAL HIGH (ref 70–99)

## 2022-08-01 LAB — BASIC METABOLIC PANEL
Anion gap: 12 (ref 5–15)
BUN: 9 mg/dL (ref 8–23)
CO2: 19 mmol/L — ABNORMAL LOW (ref 22–32)
Calcium: 8.6 mg/dL — ABNORMAL LOW (ref 8.9–10.3)
Chloride: 108 mmol/L (ref 98–111)
Creatinine, Ser: 0.5 mg/dL (ref 0.44–1.00)
GFR, Estimated: >60 mL/min (ref >60)
Glucose, Bld: 138 mg/dL — ABNORMAL HIGH (ref 70–99)
Potassium: 3.7 mmol/L (ref 3.5–5.1)
Sodium: 139 mmol/L (ref 135–145)

## 2022-08-01 LAB — MAGNESIUM: Magnesium: 2 mg/dL (ref 1.7–2.4)

## 2022-08-01 MED ORDER — TRAMADOL HCL 50 MG PO TABS: 50.0000 mg | ORAL_TABLET | Freq: Four times a day (QID) | ORAL | Status: DC | PRN

## 2022-08-01 MED ORDER — MORPHINE SULFATE (PF) 2 MG/ML IV SOLN
1.0000 mg | INTRAVENOUS | Status: DC | PRN
  Administered 2022-08-01 – 2022-08-04 (×12): 2 mg via INTRAVENOUS
  Filled 2022-08-01 (×14): qty 1

## 2022-08-01 NOTE — Plan of Care (Signed)
  Problem: Education: Goal: Ability to describe self-care measures that may prevent or decrease complications (Diabetes Survival Skills Education) will improve Outcome: Not Progressing Goal: Individualized Educational Video(s) Outcome: Not Progressing   Problem: Coping: Goal: Ability to adjust to condition or change in health will improve Outcome: Not Progressing   Problem: Fluid Volume: Goal: Ability to maintain a balanced intake and output will improve Outcome: Not Progressing   Problem: Health Behavior/Discharge Planning: Goal: Ability to identify and utilize available resources and services will improve Outcome: Not Progressing Goal: Ability to manage health-related needs will improve Outcome: Not Progressing   Problem: Metabolic: Goal: Ability to maintain appropriate glucose levels will improve Outcome: Not Progressing   Problem: Nutritional: Goal: Maintenance of adequate nutrition will improve Outcome: Not Progressing Goal: Progress toward achieving an optimal weight will improve Outcome: Not Progressing   Problem: Skin Integrity: Goal: Risk for impaired skin integrity will decrease Outcome: Not Progressing   Problem: Tissue Perfusion: Goal: Adequacy of tissue perfusion will improve Outcome: Not Progressing   Problem: Education: Goal: Knowledge of General Education information will improve Description: Including pain rating scale, medication(s)/side effects and non-pharmacologic comfort measures Outcome: Not Progressing   Problem: Health Behavior/Discharge Planning: Goal: Ability to manage health-related needs will improve Outcome: Not Progressing   Problem: Clinical Measurements: Goal: Ability to maintain clinical measurements within normal limits will improve Outcome: Not Progressing Goal: Will remain free from infection Outcome: Not Progressing Goal: Diagnostic test results will improve Outcome: Not Progressing Goal: Respiratory complications will  improve Outcome: Not Progressing Goal: Cardiovascular complication will be avoided Outcome: Not Progressing   Problem: Activity: Goal: Risk for activity intolerance will decrease Outcome: Not Progressing   Problem: Nutrition: Goal: Adequate nutrition will be maintained Outcome: Not Progressing  Pt unable to progress due to mental state, no family present to teach Problem: Coping: Goal: Level of anxiety will decrease Outcome: Not Progressing   Problem: Elimination: Goal: Will not experience complications related to bowel motility Outcome: Not Progressing Goal: Will not experience complications related to urinary retention Outcome: Not Progressing   Problem: Pain Managment: Goal: General experience of comfort will improve Outcome: Not Progressing   Problem: Safety: Goal: Ability to remain free from injury will improve Outcome: Not Progressing   Problem: Skin Integrity: Goal: Risk for impaired skin integrity will decrease Outcome: Not Progressing

## 2022-08-01 NOTE — Progress Notes (Signed)
PT Cancellation Note  Patient Details Name: Jennifer Cowan MRN: [id number] DOB: [birth year]   Cancelled Treatment:    Reason Eval/Treat Not Completed: Patient declined, reporting she was not going to get OOB today. Pt ed provided on importance of active participation with therapy to maximize safety and IND to return home. Pt adamantly declined, closed eyes and no longer verbally communicated with therapist. PT to continue to follow acutely.  Johnny Bridge, PT Acute Rehab  Jacqualyn Posey 08/01/2022, 11:29 AM

## 2022-08-01 NOTE — Progress Notes (Signed)
PROGRESS NOTE  Jennifer Cowan    DOB: [birth year], 77 y.o.  ZOX:[id number]    Code Status: Full Code   DOA: 07/31/2022   LOS: 1   Brief hospital course  Jennifer Cowan is a 77 y.o. 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  In the ED, patient was slightly tachycardic but afebrile.  Not hypotensive.  Labs showing no leukocytosis or anemia, potassium 3.4, creatinine 0.5, ammonia level <10, SARS-CoV-2 PCR negative.  UA showing negative nitrite, moderate leukocytes, and microscopy showing 6-10 RBCs, 11-20 WBCs, and no bacteria.  Chest x-ray showing small left pleural effusion.     CT head- No significant change in extensive vasogenic edema throughout the right temporal and parietal lobes, and involving the right thalamus and basal ganglia. Mild right-to-left midline shift measuring 5 mm also shows no significant change. No evidence of intracranial hemorrhage.   CT abdomen pelvis- Substantial scarring in the right kidney upper pole along with a 2.2 cm staghorn calculus. 2. 1.0 cm left kidney lower pole stone. 3. 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 4. Right breast collection with cutaneous thickening in the right medial breast, and stranding in the soft tissues of the right chest wall lateral to the collection. 5. Trace pericardial effusion and trace left pleural effusion. 6. Aortic and coronary atherosclerosis. Mitral and aortic valve calcifications. 7.  Multiple uterine fibroids. 8. Lumbar spondylosis and degenerative disc disease with resulting bilateral foraminal impingement at L4-5.   ED PA discussed with Dr. Mosetta Putt, recommended starting steroids and oncology consult in the morning.  Case was also discussed with Dr. Marlou Porch with urology who felt that since there is no evidence of hydronephrosis, fever, or leukocytosis, no treatment needed for the kidney stones at this time and they are less likely to be the source of infection.  Patient received IV Decadron 10 mg, Toradol, and 1 L normal saline bolus in the ED.    Patient appears slightly confused.  History provided mostly by her niece Jennifer Cowan at bedside who states patient has been very weak since her mastectomy surgery a month ago.  She is barely able to get out of bed and walk.  She is eating food but her appetite is poor.  Also been confused and complaining of pain all over but recently started endorsing right-sided abdominal/flank pain and pain/burning with urination.  No fevers.  She coughs occasionally, no shortness of breath or chest pain.  No vomiting or diarrhea.  Niece states patient is receiving immunotherapy for her cancer every 3 weeks.  She used to be on steroids for a long time which were tapered off by her oncologist a week ago.  08/01/22 -stable, lethargic  Assessment & Plan  Principal Problem:   UTI (urinary tract infection) Active Problems:   Metastasis to brain [facility])   History of breast cancer   Malignant neoplasm of unspecified part of unspecified bronchus or lung (HCC)   Type 2 diabetes mellitus (HCC)   Renal calculi   Microscopic hematuria   Acute encephalopathy  UTI with hematuria  Renal  calculi-  Labs and imaging as above - urology consulted- did not recommend intervention - continue IV Abx for infection - analgesia PRN - follow cultures   Acute encephalopathy Lung cancer with brain metastases Breast cancer CT head showing stable extensive vasogenic edema  and stable mild right to left midline shift measuring 5 mm.  No evidence of intracranial hemorrhage on CT.  Patient was given IV Decadron 10 mg in the ED.   - Continue Keppra. - oncology consulted, appreciate your input  - continue IV steroids   Generalized weakness In the setting of known cancer and UTI.  Also she was previously on steroids which were tapered off a week ago which could be contributing.  PT/OT eval, fall precautions.   Mild hypokalemia- resolved   Recurrent pericardial effusion Likely related to malignancy.  Patient had moderate pericardial effusion on echo done in February 2024 which had essentially resolved on repeat echo done in April.  CT showing trace pericardial effusion.   Left pleural effusion Patient had tiny left pleural effusion on CT back in February 2024 and no thoracentesis was performed.  CT showing trace left pleural effusion.  No respiratory distress or hypoxia.   Benign angiomyolipoma of the left kidney Seen on CT.   Hyperlipidemia Continue Lipitor.   Hypertension Blood pressure stable.  Continue atenolol.   Diet controlled type 2 diabetes A1c 5.9 in June 2023, will repeat.  Order sensitive sliding scale insulin.  Body mass index is 24.02 kg/m.  VTE ppx: heparin injection 5,000 Units Start: 08/01/22 0600  Diet:     Diet   Diet Carb Modified Fluid consistency: Thin; Room service appropriate? Yes   Consultants: Oncology Urology   Subjective 08/01/22    Pt reports feeling "fine". Denies pain or dysuria. She is tired appearing and endorses feeling tired. Has no complaints today.   Objective   Vitals:   07/31/22 2200 07/31/22 2300 08/01/22 0003 08/01/22 0422  BP: 135/79 121/74 128/84 135/88  Pulse: (!) 108 99 (!) 103 (!) 104  Resp: (!) 23 (!) 22 16 16   Temp: 98.3 F (36.8 C)  97.9 F (36.6 C) 98.1 F (36.7 C)  TempSrc:   Oral Oral  SpO2: 92% 93% 91% 95%  Weight:      Height:       No intake or output data in the 24 hours  ending 08/01/22 0801 Filed Weights   07/31/22 1315  Weight: 71.7 kg     Physical Exam:  General: awake but rests with eyes closed. Short responses, very lethargic HEENT: MMM, hearing grossly normal Respiratory: normal respiratory effort. CTAB Cardiovascular: quick capillary refill, normal S1/S2, RRR, no JVD, murmurs Nervous: A&O x3, lethargic  Extremities: moves all equally, trace LE edema, normal tone Skin: dry, intact, normal temperature, normal color. No rashes, lesions or ulcers on exposed skin Psychiatry: flat mood, congruent affect  Labs   I have personally reviewed the following labs and imaging studies CBC    Component Value Date/Time   WBC 5.4 08/01/2022 0032   RBC 3.83 (L) 08/01/2022 0032   HGB 12.0 08/01/2022 0032   HGB 13.8 07/18/2022 1409   HGB 15.7 07/07/2021 1538   HCT 37.9 08/01/2022 0032   HCT 46.6 07/07/2021 1538   PLT 395 08/01/2022 0032   PLT 247 07/18/2022 1409   PLT 450 07/07/2021 1538   MCV 99.0 08/01/2022 0032   MCV 88 07/07/2021 1538   MCH 31.3 08/01/2022 0032   MCHC 31.7 08/01/2022 0032   RDW  14.7 08/01/2022 0032   RDW 12.9 07/07/2021 1538   LYMPHSABS 0.5 (L) 07/31/2022 1330   LYMPHSABS 1.6 07/07/2021 1538   MONOABS 0.7 07/31/2022 1330   EOSABS 0.1 07/31/2022 1330   EOSABS 0.1 07/07/2021 1538   BASOSABS 0.0 07/31/2022 1330   BASOSABS 0.0 07/07/2021 1538      Latest Ref Rng & Units 08/01/2022   12:32 AM 07/31/2022    1:30 PM 07/18/2022    2:09 PM  BMP  Glucose 70 - 99 mg/dL 161  096  045   BUN 8 - 23 mg/dL 9  9  14    Creatinine 0.44 - 1.00 mg/dL 4.09  8.11  9.14   Sodium 135 - 145 mmol/L 139  138  141   Potassium 3.5 - 5.1 mmol/L 3.7  3.4  3.8   Chloride 98 - 111 mmol/L 108  106  108   CO2 22 - 32 mmol/L 19  23  25    Calcium 8.9 - 10.3 mg/dL 8.6  8.6  8.9     CT ABDOMEN PELVIS W WO CONTRAST  Result Date: 07/31/2022 CLINICAL DATA:  Microscopic hematuria, metastatic lung cancer and right breast cancer * Tracking Code: BO * EXAM: CT  ABDOMEN AND PELVIS WITHOUT AND WITH CONTRAST TECHNIQUE: Multidetector CT imaging of the abdomen and pelvis was performed following the standard protocol before and following the bolus administration of intravenous contrast. RADIATION DOSE REDUCTION: This exam was performed according to the departmental dose-optimization program which includes automated exposure control, adjustment of the mA and/or kV according to patient size and/or use of iterative reconstruction technique. CONTRAST:  OMNIPAQUE IOHEXOL 300 MG/ML  SOLN COMPARISON:  PET-CT 05/05/2022 FINDINGS: Despite efforts by the technologist and patient, motion artifact is present on today's exam and could not be eliminated. This reduces exam sensitivity and specificity. Lower chest: Mild atelectasis posteriorly in the left lower lobe. Right breast collection with cutaneous thickening in the right medial breast, and stranding in the soft tissues of the right chest wall lateral to the collection. Left anterior descending and right coronary artery atherosclerosis. Mitral and aortic valve calcifications. Trace pericardial effusion. Trace left pleural effusion. Hepatobiliary: Simple caudate lobe cyst 5.2 by 4.6 cm. No further imaging workup of this lesion is indicated. Similar simple cysts in segment 3 of the liver. No further imaging workup of these lesions is indicated. Mildly contracted gallbladder. Pancreas: Unremarkable Spleen: Unremarkable Adrenals/Urinary Tract: Adrenal glands normal. Substantial scarring in the right kidney upper pole along with a 2.2 cm staghorn calculus. Benign right kidney lower pole cyst 1.7 cm in diameter on image 26 series 19. No further imaging workup of this lesion is indicated. 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 1.0 cm left kidney lower pole stone on image 32 series 13. Urinary bladder unremarkable. No significant abnormal filling  defect or abnormal enhancement along the urothelium. Stomach/Bowel: Unremarkable Vascular/Lymphatic: Atherosclerosis is present, including aortoiliac atherosclerotic disease. Reproductive: Multiple uterine fibroids are again observed. Other: No supplemental non-categorized findings. Musculoskeletal: Spurring along the femoral necks and femoral heads bilaterally. Lumbar spondylosis and degenerative disc disease with resulting bilateral foraminal impingement at L4-5. IMPRESSION: 1. Substantial scarring in the right kidney upper pole along with a 2.2 cm staghorn calculus. 2. 1.0 cm left kidney lower pole stone. 3. 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 4. Right breast collection with cutaneous thickening in the right medial breast, and  stranding in the soft tissues of the right chest wall lateral to the collection. 5. Trace pericardial effusion and trace left pleural effusion. 6. Aortic and coronary atherosclerosis. Mitral and aortic valve calcifications. 7. Multiple uterine fibroids. 8. Lumbar spondylosis and degenerative disc disease with resulting bilateral foraminal impingement at L4-5. Aortic Atherosclerosis (ICD10-I70.0). Electronically Signed   By: Gaylyn Rong M.D.   On: 07/31/2022 17:17   CT Head Wo Contrast  Result Date: 07/31/2022 CLINICAL DATA:  Altered mental status.  Metastatic lung carcinoma EXAM: CT HEAD WITHOUT CONTRAST TECHNIQUE: Contiguous axial images were obtained from the base of the skull through the vertex without intravenous contrast. RADIATION DOSE REDUCTION: This exam was performed according to the departmental dose-optimization program which includes automated exposure control, adjustment of the mA and/or kV according to patient size and/or use of iterative reconstruction technique. COMPARISON:  CT on 06/11/2022 FINDINGS: Brain: No evidence of intracranial hemorrhage. Extensive vasogenic edema throughout the right temporal and parietal lobes and involving the right  thalamus and basal ganglia shows no significant change. Mass effect on the right lateral and mild midline shift measuring 5 mm also shows no significant change. Mild chronic small vessel disease again noted. Vascular:  No hyperdense vessel or other acute findings. Skull: No evidence of fracture or other significant bone abnormality. Sinuses/Orbits:  No acute findings. Other: None. IMPRESSION: No significant change in extensive vasogenic edema throughout the right temporal and parietal lobes, and involving the right thalamus and basal ganglia. Mild right-to-left midline shift measuring 5 mm also shows no significant change. No evidence of intracranial hemorrhage. Electronically Signed   By: Danae Orleans M.D.   On: 07/31/2022 16:55   DG CHEST PORT 1 VIEW  Result Date: 07/31/2022 CLINICAL DATA:  Worsening altered mental status. History of metastatic lung cancer. EXAM: PORTABLE CHEST 1 VIEW COMPARISON:  July 30, 2021 FINDINGS: Injectable port terminates at the cavoatrial junction. Normal cardiac silhouette. Small left pleural effusion. Post treatment changes in the left upper lobe. The right lung is clear. IMPRESSION: 1. Small left pleural effusion. 2. Post treatment changes in the left upper lobe. Electronically Signed   By: Ted Mcalpine M.D.   On: 07/31/2022 14:38    Disposition Plan & Communication  Patient status: Inpatient  Admitted From: Home Planned disposition location: Home Anticipated discharge date: 7/17 pending IV Abx completed  Family Communication: none at bedside    Author: Leeroy Bock, DO [facility] 08/01/2022, 8:01 AM   Available by Epic secure chat 7AM-7PM. If 7PM-7AM, please contact night-coverage.  [facility] contact information found on [email or web address].

## 2022-08-02 DIAGNOSIS — G936 Cerebral edema: Secondary | ICD-10-CM

## 2022-08-02 LAB — HEMOGLOBIN A1C
Hgb A1c MFr Bld: 6.2 % — ABNORMAL HIGH (ref 4.8–5.6)
Mean Plasma Glucose: 131 mg/dL

## 2022-08-02 LAB — CBC
HCT: 36.7 % (ref 36.0–46.0)
Hemoglobin: 11.7 g/dL — ABNORMAL LOW (ref 12.0–15.0)
MCH: 31.9 pg (ref 26.0–34.0)
MCHC: 31.9 g/dL (ref 30.0–36.0)
MCV: 100 fL (ref 80.0–100.0)
Platelets: 420 10*3/uL — ABNORMAL HIGH (ref 150–400)
RBC: 3.67 MIL/uL — ABNORMAL LOW (ref 3.87–5.11)
RDW: 14.7 % (ref 11.5–15.5)
WBC: 6 10*3/uL (ref 4.0–10.5)
nRBC: 0 % (ref 0.0–0.2)

## 2022-08-02 LAB — BASIC METABOLIC PANEL
Anion gap: 6 (ref 5–15)
BUN: 14 mg/dL (ref 8–23)
CO2: 23 mmol/L (ref 22–32)
Calcium: 8.6 mg/dL — ABNORMAL LOW (ref 8.9–10.3)
Chloride: 112 mmol/L — ABNORMAL HIGH (ref 98–111)
Creatinine, Ser: 0.62 mg/dL (ref 0.44–1.00)
GFR, Estimated: >60 mL/min (ref >60)
Glucose, Bld: 116 mg/dL — ABNORMAL HIGH (ref 70–99)
Potassium: 3.5 mmol/L (ref 3.5–5.1)
Sodium: 141 mmol/L (ref 135–145)

## 2022-08-02 LAB — GLUCOSE, CAPILLARY
Glucose-Capillary: 96 mg/dL (ref 70–99)
Glucose-Capillary: 109 mg/dL — ABNORMAL HIGH (ref 70–99)
Glucose-Capillary: 173 mg/dL — ABNORMAL HIGH (ref 70–99)

## 2022-08-02 LAB — URINE CULTURE

## 2022-08-02 MED ORDER — METHYLPREDNISOLONE SODIUM SUCC 125 MG IJ SOLR: 125.0000 mg | Freq: Two times a day (BID) | INTRAMUSCULAR | Status: DC

## 2022-08-02 MED ORDER — DEXAMETHASONE SODIUM PHOSPHATE 10 MG/ML IJ SOLN
10.0000 mg | Freq: Once | INTRAMUSCULAR | Status: AC
  Administered 2022-08-02: 10 mg via INTRAVENOUS
  Filled 2022-08-02: qty 1

## 2022-08-02 MED ORDER — HALOPERIDOL LACTATE 5 MG/ML IJ SOLN
1.0000 mg | Freq: Once | INTRAMUSCULAR | Status: AC
  Administered 2022-08-02: 1 mg via INTRAVENOUS
  Filled 2022-08-02: qty 1

## 2022-08-02 MED ORDER — DEXAMETHASONE SODIUM PHOSPHATE 10 MG/ML IJ SOLN
6.0000 mg | Freq: Four times a day (QID) | INTRAMUSCULAR | Status: DC
  Administered 2022-08-02 – 2022-08-04 (×6): 6 mg via INTRAVENOUS
  Filled 2022-08-02 (×6): qty 1

## 2022-08-02 NOTE — Progress Notes (Signed)
Chaplain met with [facility] after hear her distress from hallway.  She was screaming that she needed to use the bathroom.  Chaplain alerted RN to assist and remained with Jennifer Cowan until [name] could come.  Jennifer Cowan has minimal outside support and appears to be less agitated with someone else in the room.  Chaplain made a referral for a companion sitter to be with her for 4 hours, but due to staffing, we are unsure when or if request will be able to be filled.   [address] [facility] Pager, [phone]

## 2022-08-02 NOTE — Progress Notes (Signed)
PROGRESS NOTE  DORIANNA MCKIVER    DOB: [birth year], 77 y.o.  ZOX:[id number]    Code Status: Full Code   DOA: 07/31/2022   LOS: 2   Brief hospital course  SOPHEE MCKIMMY is a 77 y.o. 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  In the ED, patient was slightly tachycardic but afebrile.  Not hypotensive.  Labs showing no leukocytosis or anemia, potassium 3.4, creatinine 0.5, ammonia level <10, SARS-CoV-2 PCR negative. UA showing negative nitrite, moderate leukocytes, and microscopy showing 6-10 RBCs, 11-20 WBCs, and no bacteria.  Chest x-ray showing small left pleural effusion.     CT head- No significant change in extensive vasogenic edema throughout the right temporal and parietal lobes, and involving the right thalamus and basal ganglia. Mild right-to-left midline shift measuring 5 mm also shows no significant change. No evidence of intracranial hemorrhage.   CT abdomen pelvis- Substantial scarring in the right kidney upper pole along with a 2.2 cm staghorn calculus. 2. 1.0 cm left kidney lower pole stone. 3. 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 4. Right breast collection with cutaneous thickening in the right medial breast, and stranding in the soft tissues of the right chest wall lateral to the collection. 5. Trace pericardial effusion and trace left pleural effusion. 6. Aortic and coronary atherosclerosis. Mitral and aortic valve calcifications. 7.  Multiple uterine fibroids. 8. Lumbar spondylosis and degenerative disc disease with resulting bilateral foraminal impingement at L4-5.   ED PA discussed with Dr. Mosetta Putt, recommended starting steroids and oncology consult in the morning.  Case was also discussed with Dr. Marlou Porch with urology who felt that since there is no evidence of hydronephrosis, fever, or leukocytosis, no treatment needed for the kidney stones at this time and they are less likely to be the source of infection.  Patient received IV Decadron 10 mg, Toradol, and 1 L normal saline bolus in the ED.    Patient appears slightly confused.  History provided mostly by her niece [name] at bedside who states patient has been very weak since her mastectomy surgery a month ago.  She is barely able to get out of bed and walk.  She is eating food but her appetite is poor.  Also been confused and complaining of pain all over but recently started endorsing right-sided abdominal/flank pain and pain/burning with urination.  No fevers.  She coughs occasionally, no shortness of breath or chest pain.  No vomiting or diarrhea.  Niece states patient is receiving immunotherapy for her cancer every 3 weeks.  She used to be on steroids for a long time which were tapered off by her oncologist a week ago.  08/02/22 -improved mental status  Assessment & Plan  Principal Problem:   UTI (urinary tract infection) Active Problems:   Metastasis to brain [facility])   History of breast cancer   Malignant neoplasm of unspecified part of unspecified bronchus or lung (HCC)   Type 2 diabetes mellitus (HCC)   Renal calculi   Microscopic hematuria   Acute encephalopathy   Weakness  UTI with  hematuria  Renal calculi-  Labs and imaging as above - urology consulted- did not recommend intervention - continue IV Abx for infection - analgesia PRN - follow cultures   Acute encephalopathy- improved Lung cancer with brain metastases Breast cancer CT head showing stable  extensive vasogenic edema and stable mild right to left midline shift measuring 5 mm.  No evidence of intracranial hemorrhage on CT.  Patient was given IV Decadron 10 mg in the ED.  Patient already showing some improvement- very lethargic yesterday, which was my first day meeting her and didn't open eyes. Today, she had eyes open and was having a normal conversation with me and followed commands for exam well. Complained of neck pain which she had just recently received pain medication for.  - Continue Keppra. - requested a sitter for her as she seems to be lonely and not do well when by herself for longer periods of time. Avoid sedating medications as this will alter her neuro exam and cloud the assessment of her improvement. Would be best to have friends/family at bedside. - oncology consulted, appreciate your input  - continue IV steroids- 10mg  decadron to begin with followed by 6mg  q6hr IV until discharge.  Per Dr. Barbaraann Cao- Once improved, can dc on 4mg  BID PO steroids and follow up with him outpatient. He will plan to repeat brain MRI Aug 8th. If she doesn't improve within 1-2 day on this treatment, can repeat the brain MRI then while inpatient.    Generalized weakness In the setting of known cancer and UTI.  Also she was previously on steroids which were tapered off a week ago which could be contributing.   - PT/OT eval, fall precautions.   Mild hypokalemia- resolved   Recurrent pericardial effusion Likely related to malignancy.  Patient had moderate pericardial effusion on echo done in February 2024 which had essentially resolved on repeat echo done in April.  CT showing trace pericardial effusion.   Left pleural effusion Patient had tiny left pleural effusion on CT back in February 2024 and no thoracentesis was performed.  CT showing trace left pleural effusion.  No respiratory distress or hypoxia.   Benign angiomyolipoma of the left kidney Seen on CT.   Hyperlipidemia Continue  Lipitor.   Hypertension Blood pressure stable.  Continue atenolol.   Diet controlled type 2 diabetes A1c 5.9 in June 2023. 6.2 this admission and has been on steroids long-term - sensitive sliding scale insulin.  Body mass index is 24.02 kg/m.  VTE ppx: heparin injection 5,000 Units Start: 08/01/22 0600  Diet:     Diet   Diet Carb Modified Fluid consistency: Thin; Room service appropriate? Yes   Consultants: Oncology Urology   Subjective 08/02/22    Pt reports neck pain. She states that she is hungry and wants to eat. States that she is close with her nieces. Denies other complaints or concerns.    Objective   Vitals:   08/01/22 0807 08/01/22 1223 08/01/22 2101 08/02/22 0540  BP: 120/72 (!) 125/92 120/76 114/79  Pulse: 100 82 93 86  Resp: 18 18 16 15   Temp: (!) 97.5 F (36.4 C)  98.2 F (36.8 C) (!) 97.5 F (36.4 C)  TempSrc: Oral  Oral Oral  SpO2: 93% 92% 92% 90%  Weight:      Height:        Intake/Output Summary (Last 24 hours) at 08/02/2022 0757 Last data filed at 08/01/2022 1456 Gross per 24 hour  Intake 120 ml  Output --  Net 120 ml   Filed Weights   07/31/22 1315  Weight: 71.7 kg     Physical Exam:  General: awake and alert. Responds with short sentences. Following commands and able to find and press call-bell for herself.  HEENT: MMM, hearing grossly normal Respiratory: normal respiratory effort. CTAB Cardiovascular: quick capillary refill, normal S1/S2, RRR, no JVD, murmurs Nervous: A&O x3, less lethargic  Extremities: moves all equally, trace LE edema, normal tone Skin: dry, intact, normal temperature, normal color. No rashes, lesions or ulcers on exposed skin Psychiatry: flat mood, congruent affect  Labs   I have personally reviewed the following labs and imaging studies CBC    Component Value Date/Time   WBC 6.0 08/02/2022 0432   RBC 3.67 (L) 08/02/2022 0432   HGB 11.7 (L) 08/02/2022 0432   HGB 13.8 07/18/2022 1409   HGB 15.7  07/07/2021 1538   HCT 36.7 08/02/2022 0432   HCT 46.6 07/07/2021 1538   PLT 420 (H) 08/02/2022 0432   PLT 247 07/18/2022 1409   PLT 450 07/07/2021 1538   MCV 100.0 08/02/2022 0432   MCV 88 07/07/2021 1538   MCH 31.9 08/02/2022 0432   MCHC 31.9 08/02/2022 0432   RDW 14.7 08/02/2022 0432   RDW 12.9 07/07/2021 1538   LYMPHSABS 0.5 (L) 07/31/2022 1330   LYMPHSABS 1.6 07/07/2021 1538   MONOABS 0.7 07/31/2022 1330   EOSABS 0.1 07/31/2022 1330   EOSABS 0.1 07/07/2021 1538   BASOSABS 0.0 07/31/2022 1330   BASOSABS 0.0 07/07/2021 1538      Latest Ref Rng & Units 08/02/2022    4:32 AM 08/01/2022   12:32 AM 07/31/2022    1:30 PM  BMP  Glucose 70 - 99 mg/dL 425  956  387   BUN 8 - 23 mg/dL 14  9  9    Creatinine 0.44 - 1.00 mg/dL 5.64  3.32  9.51   Sodium 135 - 145 mmol/L 141  139  138   Potassium 3.5 - 5.1 mmol/L 3.5  3.7  3.4   Chloride 98 - 111 mmol/L 112  108  106   CO2 22 - 32 mmol/L 23  19  23    Calcium 8.9 - 10.3 mg/dL 8.6  8.6  8.6     CT ABDOMEN PELVIS W WO CONTRAST  Result Date: 07/31/2022 CLINICAL DATA:  Microscopic hematuria, metastatic lung cancer and right breast cancer * Tracking Code: BO * EXAM: CT ABDOMEN AND PELVIS WITHOUT AND WITH CONTRAST TECHNIQUE: Multidetector CT imaging of the abdomen and pelvis was performed following the standard protocol before and following the bolus administration of intravenous contrast. RADIATION DOSE REDUCTION: This exam was performed according to the departmental dose-optimization program which includes automated exposure control, adjustment of the mA and/or kV according to patient size and/or use of iterative reconstruction technique. CONTRAST:  OMNIPAQUE IOHEXOL 300 MG/ML  SOLN COMPARISON:  PET-CT 05/05/2022 FINDINGS: Despite efforts by the technologist and patient, motion artifact is present on today's exam and could not be eliminated. This reduces exam sensitivity and specificity. Lower chest: Mild atelectasis posteriorly in the left  lower lobe. Right breast collection with cutaneous thickening in the right medial breast, and stranding in the soft tissues of the right chest wall lateral to the collection. Left anterior descending and right coronary artery atherosclerosis. Mitral and aortic valve calcifications. Trace pericardial effusion. Trace left pleural effusion. Hepatobiliary: Simple caudate lobe cyst 5.2 by 4.6 cm. No further imaging workup of this lesion is indicated. Similar simple cysts  in segment 3 of the liver. No further imaging workup of these lesions is indicated. Mildly contracted gallbladder. Pancreas: Unremarkable Spleen: Unremarkable Adrenals/Urinary Tract: Adrenal glands normal. Substantial scarring in the right kidney upper pole along with a 2.2 cm staghorn calculus. Benign right kidney lower pole cyst 1.7 cm in diameter on image 26 series 19. No further imaging workup of this lesion is indicated. 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 1.0 cm left kidney lower pole stone on image 32 series 13. Urinary bladder unremarkable. No significant abnormal filling defect or abnormal enhancement along the urothelium. Stomach/Bowel: Unremarkable Vascular/Lymphatic: Atherosclerosis is present, including aortoiliac atherosclerotic disease. Reproductive: Multiple uterine fibroids are again observed. Other: No supplemental non-categorized findings. Musculoskeletal: Spurring along the femoral necks and femoral heads bilaterally. Lumbar spondylosis and degenerative disc disease with resulting bilateral foraminal impingement at L4-5. IMPRESSION: 1. Substantial scarring in the right kidney upper pole along with a 2.2 cm staghorn calculus. 2. 1.0 cm left kidney lower pole stone. 3. 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 4. Right breast collection with cutaneous thickening in the right medial breast, and stranding in the soft tissues of the  right chest wall lateral to the collection. 5. Trace pericardial effusion and trace left pleural effusion. 6. Aortic and coronary atherosclerosis. Mitral and aortic valve calcifications. 7. Multiple uterine fibroids. 8. Lumbar spondylosis and degenerative disc disease with resulting bilateral foraminal impingement at L4-5. Aortic Atherosclerosis (ICD10-I70.0). Electronically Signed   By: Gaylyn Rong M.D.   On: 07/31/2022 17:17   CT Head Wo Contrast  Result Date: 07/31/2022 CLINICAL DATA:  Altered mental status.  Metastatic lung carcinoma EXAM: CT HEAD WITHOUT CONTRAST TECHNIQUE: Contiguous axial images were obtained from the base of the skull through the vertex without intravenous contrast. RADIATION DOSE REDUCTION: This exam was performed according to the departmental dose-optimization program which includes automated exposure control, adjustment of the mA and/or kV according to patient size and/or use of iterative reconstruction technique. COMPARISON:  CT on 06/11/2022 FINDINGS: Brain: No evidence of intracranial hemorrhage. Extensive vasogenic edema throughout the right temporal and parietal lobes and involving the right thalamus and basal ganglia shows no significant change. Mass effect on the right lateral and mild midline shift measuring 5 mm also shows no significant change. Mild chronic small vessel disease again noted. Vascular:  No hyperdense vessel or other acute findings. Skull: No evidence of fracture or other significant bone abnormality. Sinuses/Orbits:  No acute findings. Other: None. IMPRESSION: No significant change in extensive vasogenic edema throughout the right temporal and parietal lobes, and involving the right thalamus and basal ganglia. Mild right-to-left midline shift measuring 5 mm also shows no significant change. No evidence of intracranial hemorrhage. Electronically Signed   By: Danae Orleans M.D.   On: 07/31/2022 16:55   DG CHEST PORT 1 VIEW  Result Date:  07/31/2022 CLINICAL DATA:  Worsening altered mental status. History of metastatic lung cancer. EXAM: PORTABLE CHEST 1 VIEW COMPARISON:  July 30, 2021 FINDINGS: Injectable port terminates at the cavoatrial junction. Normal cardiac silhouette. Small left pleural effusion. Post treatment changes in the left upper lobe. The right lung is clear. IMPRESSION: 1. Small left pleural effusion. 2. Post treatment changes in the left upper lobe. Electronically Signed   By: Ted Mcalpine M.D.   On: 07/31/2022 14:38    Disposition Plan & Communication  Patient status: Inpatient  Admitted From: Home Planned  disposition location: Home Anticipated discharge date: 7/19 pending IV Abx completed and mental status improvement with steroids  Family Communication: none at bedside    Author: Leeroy Bock, DO [facility] 08/02/2022, 7:57 AM   Available by Epic secure chat 7AM-7PM. If 7PM-7AM, please contact night-coverage.  [facility] contact information found on [email or web address].

## 2022-08-02 NOTE — Evaluation (Signed)
Physical Therapy Evaluation Patient Details Name: Jennifer Cowan MRN: [id number] DOB: [birth year] Today's Date: 08/02/2022  History of Present Illness  Jennifer Cowan is a 77 y.o. 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  Clinical Impression  Pt admitted with above diagnosis.  Pt currently with functional limitations due to the deficits listed below (see PT Problem List). Pt will benefit from acute skilled PT to increase their independence and safety with mobility to allow discharge.     The patient received in bed yelling out  for someone to help her, indicating back pain. Patient finally able to sit up onto bed edge  for ~ , scoot along bed to move to [facility], Did not attempt to stand as patient  indicting severe pain.  Patient repositioned in supine and was drifting off to sleep .  PT efforts limited due to pain, Patient had been maximally premedicated per RN. Continue PT as patient tolerates.      Assistance Recommended at Discharge Frequent or constant Supervision/Assistance  If plan is discharge home, recommend the following:  Can travel by private vehicle  Two people to help with walking and/or transfers;A lot of help with bathing/dressing/bathroom;Assistance with cooking/housework;Direct supervision/assist for medications management;Direct supervision/assist for financial management;Assist for transportation;Help with stairs or ramp for entrance   No    Equipment Recommendations None recommended by PT  Recommendations for Other Services       Functional Status Assessment Patient has had a recent decline in their functional status and demonstrates the  ability to make significant improvements in function in a reasonable and predictable amount of time.     Precautions / Restrictions Precautions Precautions: Fall      Mobility  Bed Mobility Overal bed mobility: Needs Assistance Bed Mobility: Rolling, Sidelying to Sit, Sit to Sidelying Rolling: Mod assist Sidelying to sit: Mod assist     Sit to sidelying: Mod assist General bed mobility comments: multimodal cues to initiate, frequent cues for staying on task, assisted with scoot  along bed to hOB    Transfers                   General transfer comment: did not attmept    Ambulation/Gait                  Stairs            Wheelchair Mobility     Tilt Bed    Modified Rankin (Stroke Patients Only)       Balance Overall balance assessment: Needs assistance Sitting-balance support: Bilateral upper extremity supported, Feet supported Sitting balance-Leahy Scale: Poor Sitting balance - Comments: moving around in sitting                                     Pertinent Vitals/Pain Pain Assessment Pain Assessment: Faces Faces Pain Scale: Hurts worst Negative Vocalization: repeated troubled calling out, loud moaning/groaning, crying Facial Expression: sad, frightened, frown Body Language: rigid, fists clenched, knees up, pushing/pulling away, strikes out Consolability: distracted or reassured by voice/touch Pain Location: right side of back Pain Descriptors / Indicators: Discomfort, Crying, Guarding, Grimacing, Moaning Pain Intervention(s): Monitored during session, Limited activity  within patient's tolerance, Premedicated before session    Home Living Family/patient expects to be discharged to:: Unsure                   Additional Comments: pt states she lives with her sister, unable to provide further  info    Prior Function               Mobility Comments: unsure  level of assist since DC from [facility], no family  present       Hand Dominance   Dominant Hand: Right    Extremity/Trunk Assessment   Upper Extremity Assessment Upper Extremity Assessment: Overall WFL for tasks assessed    Lower Extremity Assessment Lower Extremity Assessment: Generalized weakness    Cervical / Trunk Assessment Cervical / Trunk Assessment: Normal  Communication   Communication: No difficulties  Cognition Arousal/Alertness: Awake/alert Behavior During Therapy: Anxious, Restless Overall Cognitive Status: No family/caregiver present to determine baseline cognitive functioning Area of Impairment: Orientation, Attention, Memory, Following commands, Awareness, Problem solving                 Orientation Level: Place, Time, Situation Current Attention Level: Focused Memory: Decreased short-term memory Following Commands: Follows one step commands inconsistently   Awareness: Intellectual Problem Solving: Slow processing, Decreased initiation, Difficulty sequencing, Requires verbal cues General Comments: patient requiring much encouragement to attempt sitting, patient indicating back pain,        General Comments      Exercises     Assessment/Plan    PT Assessment Patient needs continued PT services  PT Problem List Decreased strength;Decreased activity tolerance;Decreased mobility;Decreased cognition;Decreased range of motion;Decreased balance;Decreased coordination;Decreased knowledge of use of DME;Decreased knowledge of precautions;Pain       PT Treatment Interventions DME instruction;Gait training;Therapeutic activities;Cognitive remediation;Functional mobility training;Patient/family education    PT Goals (Current goals can be found in the Care Plan section)  Acute Rehab PT Goals PT Goal Formulation: Patient unable to participate in goal setting Time For Goal Achievement: 08/16/22 Potential to Achieve Goals: Poor    Frequency Min 1X/week     Co-evaluation                AM-PAC PT "6 Clicks" Mobility  Outcome Measure Help needed turning from your back to your side while in a flat bed without using bedrails?: A Lot Help needed moving from lying on your back to sitting on the side of a flat bed without using bedrails?: A Lot Help needed moving to and from a bed to a chair (including a wheelchair)?: Total Help needed standing up from a chair using your arms (e.g., wheelchair or bedside chair)?: Total Help needed to walk in hospital room?: Total Help needed climbing 3-5 steps with a railing? : Total 6 Click Score: 8    End of Session   Activity Tolerance: Patient limited by pain Patient left: in bed;with call bell/phone within reach;with bed alarm set;with nursing/sitter in room Nurse Communication: Mobility status PT Visit Diagnosis: Unsteadiness on feet (R26.81);Difficulty in walking, not elsewhere classified (R26.2);Adult, failure to thrive (R62.7);Pain    Time: 1050-1116 PT Time Calculation (min) (ACUTE ONLY): 26 min   Charges:   PT Evaluation $PT Eval Low Complexity: 1 Low PT Treatments $Therapeutic Activity: 8-22 mins PT General Charges $$ ACUTE PT VISIT: 1 Visit         Blanchard Kelch PT Acute Rehabilitation Services Office [phone] Weekend pager-[phone]   Rada Hay 08/02/2022, 2:05 PM

## 2022-08-02 NOTE — Progress Notes (Signed)
Pt c/o pain in back ~ 0050, medicated with prescribed morphine.  Pt continued to scream out in pain, becoming increasingly agitated.  Johann Capers NP made aware, orders received and implemented.  Haldol ineffective for agitation, will cont to monitor.

## 2022-08-02 NOTE — Plan of Care (Signed)

## 2022-08-02 NOTE — Progress Notes (Signed)
OT Cancellation Note  Patient Details Name: Jennifer Cowan MRN: [id number] DOB: May 28, 1945   Cancelled Treatment:    Reason Eval/Treat Not Completed: Fatigue/lethargy limiting ability to participate  Reuben Likes, OTR/L 08/02/2022, 5:13 PM

## 2022-08-02 NOTE — Progress Notes (Addendum)
Patient screaming "help me". Asked why she is screaming, states back pain.  45 minutes this morning spent with patient :  repositioning, emotional support, pain medications X 2, emotional support again, repositioning again, bedpan, repositioning.  Nursing Tech assisted.  Still yelling and crying out "help me".   Discussed with MD in hallway, asked for anxiety med for patient's comfort and severe anxiety. Asked MD if steroids may be contributing to some of the anxiety?  MD suggested a Set designer". Will discuss with Charge Nurse.  Addendum at 1122:  Patient states her pain score is now a 5, but still cries out.  She declined her breakfast tray, wants a cheeseburger and strawberry milkshake for lunch. Will order for her.  Addendum at 1345: Screaming has subsided a little. Patient states her pain score is now 3. Wanted a cheeseburger for lunch but took only one bite. She did like the strawberry Ensure and strawberry ice cream. Fed herself the ice cream.

## 2022-08-03 DIAGNOSIS — R319 Hematuria, unspecified: Secondary | ICD-10-CM | POA: Diagnosis not present

## 2022-08-03 DIAGNOSIS — N390 Urinary tract infection, site not specified: Secondary | ICD-10-CM | POA: Diagnosis not present

## 2022-08-03 LAB — CBC
HCT: 36.2 % (ref 36.0–46.0)
Hemoglobin: 11.6 g/dL — ABNORMAL LOW (ref 12.0–15.0)
MCH: 31.7 pg (ref 26.0–34.0)
MCHC: 32 g/dL (ref 30.0–36.0)
MCV: 98.9 fL (ref 80.0–100.0)
Platelets: 430 10*3/uL — ABNORMAL HIGH (ref 150–400)
RBC: 3.66 MIL/uL — ABNORMAL LOW (ref 3.87–5.11)
RDW: 14.6 % (ref 11.5–15.5)
WBC: 5.3 10*3/uL (ref 4.0–10.5)
nRBC: 0 % (ref 0.0–0.2)

## 2022-08-03 LAB — BASIC METABOLIC PANEL
Anion gap: 8 (ref 5–15)
BUN: 11 mg/dL (ref 8–23)
CO2: 23 mmol/L (ref 22–32)
Calcium: 8.7 mg/dL — ABNORMAL LOW (ref 8.9–10.3)
Chloride: 108 mmol/L (ref 98–111)
Creatinine, Ser: 0.5 mg/dL (ref 0.44–1.00)
GFR, Estimated: >60 mL/min (ref >60)
Glucose, Bld: 128 mg/dL — ABNORMAL HIGH (ref 70–99)
Potassium: 3.6 mmol/L (ref 3.5–5.1)
Sodium: 139 mmol/L (ref 135–145)

## 2022-08-03 LAB — GLUCOSE, CAPILLARY
Glucose-Capillary: 119 mg/dL — ABNORMAL HIGH (ref 70–99)
Glucose-Capillary: 129 mg/dL — ABNORMAL HIGH (ref 70–99)
Glucose-Capillary: 135 mg/dL — ABNORMAL HIGH (ref 70–99)
Glucose-Capillary: 140 mg/dL — ABNORMAL HIGH (ref 70–99)

## 2022-08-03 MED ORDER — QUETIAPINE FUMARATE 25 MG PO TABS
12.5000 mg | ORAL_TABLET | Freq: Two times a day (BID) | ORAL | Status: DC
  Administered 2022-08-03 – 2022-08-09 (×12): 12.5 mg via ORAL
  Filled 2022-08-03 (×12): qty 1

## 2022-08-03 NOTE — TOC Progression Note (Signed)
Transition of Care [facility]) - Progression Note    Patient Details  Name: Jennifer Cowan MRN: [id number] Date of Birth: [birth year]  Transition of Care [facility]) CM/SW Contact  Harriett Sine, RN Phone Number: 08/03/2022, 3:01 PM  Clinical Narrative:    Unable to speak with pt, sleeping. Unable to speak with niece Phil Dopp twice today, left message. PASR- [id number] A, FL2  applied for. Insurance auth started. Unable to determine needs at present.        Expected Discharge Plan and Services                                               Social Determinants of Health (SDOH) Interventions SDOH Screenings   Food Insecurity: No Food Insecurity (06/27/2022)  Housing: Low Risk  (06/27/2022)  Transportation Needs: No Transportation Needs (06/27/2022)  Utilities: Not At Risk (06/27/2022)  Depression (PHQ2-9): High Risk (07/27/2022)  Tobacco Use: Low Risk  (07/29/2022)  Health Literacy: Low Risk  (02/06/2020)   Received from [facility], [facility]    Readmission Risk Interventions     No data to display

## 2022-08-03 NOTE — Progress Notes (Signed)
PROGRESS NOTE  Jennifer Cowan WUJ:[id number] DOB: April 02, 1945 DOA: 07/31/2022 PCP: [name], FNP   LOS: 3 days   Brief Narrative / Interim history: 77 y.o. 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  In the ED, patient was slightly tachycardic but afebrile.  Not hypotensive.  Labs showing no leukocytosis or anemia, potassium 3.4, creatinine 0.5, ammonia level <10, SARS-CoV-2 PCR negative. UA showing negative nitrite, moderate leukocytes, and microscopy showing 6-10 RBCs, 11-20 WBCs, and no bacteria.  Chest x-ray showing small left pleural effusion. Head CT with no significant change in extensive vasogenic edema throughout the right temporal and parietal lobes, and involving the right thalamus and basal ganglia. Mild right-to-left midline shift measuring 5 mm also shows no significant change.   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 Case was discussed  with Dr Marlou Porch, did not recommend any intervention for now. -continue antibiotics, follow cultures   Active problems  Acute encephalopathy, lung cancer with brain metastases, breast cancer - CT head showing stable extensive vasogenic edema and stable mild right to left midline shift measuring 5 mm.  No evidence of intracranial hemorrhage on CT. Continue Keppra, Decadron, 6mg  q6hr IV until discharge.  Per Dr. Barbaraann Cao- Once improved, can dc on 4mg  BID PO steroids and follow up with him outpatient. He will plan to repeat brain MRI Aug 8th. If she doesn't improve within 1-2 day on this treatment, can repeat the brain MRI then while inpatient.    Generalized weakness - In the setting of known cancer and UTI.  Also she was previously on steroids which were tapered off a week ago which could be contributing. 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  Patient had moderate pericardial effusion on echo done in February 2024 which had essentially resolved on repeat echo done in April.  CT showing trace pericardial effusion.   Left pleural effusion  -Patient had tiny left pleural effusion on CT back in February 2024 and no thoracentesis was performed.  CT showing trace left pleural effusion.  No respiratory distress or hypoxia.   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   Hyperlipidemia - Continue Lipitor.   Hypertension - Blood pressure stable.  Continue atenolol.   Diet controlled type 2 diabetes - A1c 5.9 in June 2023. 6.2 this admission and has been on steroids long-term. Continue SSI  Lab Results  Component Value Date   HGBA1C 6.2 (H) 08/01/2022   CBG (  last 3)  Recent Labs    08/02/22 1628 08/03/22 0036 08/03/22 0800  GLUCAP 173* 140* 119*   Scheduled Meds:  atenolol  12.5 mg Oral Daily   atorvastatin  80 mg Oral QHS   Chlorhexidine Gluconate Cloth  6 each Topical Daily   dexamethasone (DECADRON)  injection  6 mg Intravenous Q6H   heparin  5,000 Units Subcutaneous Q8H   insulin aspart  0-9 Units Subcutaneous TID WC   levETIRAcetam  500 mg Oral BID   Continuous Infusions:  cefTRIAXone (ROCEPHIN)  IV 1 g (08/03/22 0042)   PRN Meds:.acetaminophen **OR** acetaminophen, morphine injection, naLOXone (NARCAN)  injection, sodium chloride flush  Current Outpatient Medications  Medication Instructions   Accu-Chek Softclix Lancets lancets Use to check blood sugar as directed 4 times a day   acetaminophen (TYLENOL) 1,000 mg, Oral, Every 6 hours PRN   amLODipine (NORVASC) 10 MG tablet TAKE 1 TABLET BY MOUTH DAILY   aspirin (EQ ASPIRIN) 325 mg, Oral, Daily   atenolol (TENORMIN) 12.5 mg, Oral, Daily   atorvastatin (LIPITOR) 80 mg, Oral, Daily   benazepril (LOTENSIN) 10 MG tablet TAKE 1 TABLET BY MOUTH DAILY   blood glucose meter kit and supplies KIT Dispense based on patient and insurance preference. Use up to four times daily as directed.   Blood Glucose Monitoring Suppl (ACCU-CHEK GUIDE) w/Device KIT Use as directed 4 times a day   ferrous sulfate 325 mg, Oral, Daily with breakfast   gabapentin (NEURONTIN) 300 mg, Oral, 2 times daily   glucose blood (ACCU-CHEK GUIDE) test strip Use as directed 4 times a day   ketoconazole (NIZORAL) 2 % cream 1 Application, Topical, See admin instructions, Apply to both feet 2 times a day   levETIRAcetam (KEPPRA) 500 mg, Oral, 2 times daily   lidocaine-prilocaine (EMLA) cream 1 Application, Topical, As needed   meloxicam (MOBIC) 7.5 MG tablet TAKE 1 TABLET BY MOUTH DAILY   ondansetron (ZOFRAN) 8 mg, Oral, Every 8 hours PRN   oxyCODONE (OXY IR/ROXICODONE) 5-10 mg, Oral, Every 4 hours PRN   prochlorperazine (COMPAZINE) 10 mg, Oral, Every 6 hours PRN   simvastatin (ZOCOR) 40 mg, Oral, Daily   spironolactone (ALDACTONE) 12.5 mg, Oral, Daily   traMADol (ULTRAM) 50 mg, Oral, Every 6 hours PRN   Vitamin D3 5,000 Units, Oral, Daily    Diet Orders (From  admission, onward)     Start     Ordered   07/31/22 2328  Diet Carb Modified Fluid consistency: Thin; Room service appropriate? Yes  Diet effective now       Question Answer Comment  Diet-HS Snack? Nothing   Calorie Level Medium 1600-2000   Fluid consistency: Thin   Room service appropriate? Yes      07/31/22 2332            DVT prophylaxis: heparin injection 5,000 Units Start: 08/01/22 0600   Lab Results  Component Value Date   PLT 430 (H) 08/03/2022      Code Status: Full Code  Family Communication: no family at bedside  Status is: Inpatient Remains inpatient appropriate because: severity of illness, confusion  Level of care: Telemetry  Consultants:  none  Objective: Vitals:   08/02/22 0540 08/02/22 1345 08/03/22 0043 08/03/22 0541  BP: 114/79 121/83 (!) 133/92 100/60  Pulse: 86 88 95 84  Resp: 15 18 20 17   Temp: (!) 97.5 F (36.4 C) (!) 97.4 F (36.3 C) 98.3 F (36.8 C) (!) 97.5 F (36.4 C)  TempSrc: Oral  Oral Oral Oral  SpO2: 90% 90% 93% 94%  Weight:      Height:       No intake or output data in the 24 hours ending 08/03/22 0948 Wt Readings from Last 3 Encounters:  07/31/22 71.7 kg  07/27/22 69.2 kg  07/18/22 67.8 kg    Examination:  Constitutional: NAD Eyes: no scleral icterus ENMT: Mucous membranes are moist.  Neck: normal, supple Respiratory: clear to auscultation bilaterally, no wheezing, no crackles. Normal respiratory effort. No accessory muscle use.  Cardiovascular: Regular rate and rhythm, no murmurs / rubs / gallops. No LE edema.  Abdomen: non distended, no tenderness. Bowel sounds positive.   Data Reviewed: I have independently reviewed following labs and imaging studies   CBC Recent Labs  Lab 07/31/22 1330 08/01/22 0032 08/02/22 0432 08/03/22 0555  WBC 5.4 5.4 6.0 5.3  HGB 12.7 12.0 11.7* 11.6*  HCT 39.3 37.9 36.7 36.2  PLT 394 395 420* 430*  MCV 98.5 99.0 100.0 98.9  MCH 31.8 31.3 31.9 31.7  MCHC 32.3 31.7 31.9  32.0  RDW 14.7 14.7 14.7 14.6  LYMPHSABS 0.5*  --   --   --   MONOABS 0.7  --   --   --   EOSABS 0.1  --   --   --   BASOSABS 0.0  --   --   --     Recent Labs  Lab 07/31/22 1330 07/31/22 1347 08/01/22 0032 08/02/22 0432 08/03/22 0555  NA 138  --  139 141 139  K 3.4*  --  3.7 3.5 3.6  CL 106  --  108 112* 108  CO2 23  --  19* 23 23  GLUCOSE 122*  --  138* 116* 128*  BUN 9  --  9 14 11   CREATININE 0.53  --  0.50 0.62 0.50  CALCIUM 8.6*  --  8.6* 8.6* 8.7*  AST 19  --   --   --   --   ALT 23  --   --   --   --   ALKPHOS 68  --   --   --   --   BILITOT 0.8  --   --   --   --   ALBUMIN 3.0*  --   --   --   --   MG  --   --  2.0  --   --   HGBA1C  --   --  6.2*  --   --   AMMONIA  --  <10  --   --   --     ------------------------------------------------------------------------------------------------------------------ No results for input(s): "CHOL", "HDL", "LDLCALC", "TRIG", "CHOLHDL", "LDLDIRECT" in the last 72 hours.  Lab Results  Component Value Date   HGBA1C 6.2 (H) 08/01/2022   ------------------------------------------------------------------------------------------------------------------ No results for input(s): "TSH", "T4TOTAL", "T3FREE", "THYROIDAB" in the last 72 hours.  Invalid input(s): "FREET3"  Cardiac Enzymes No results for input(s): "CKMB", "TROPONINI", "MYOGLOBIN" in the last 168 hours.  Invalid input(s): "CK" ------------------------------------------------------------------------------------------------------------------ No results found for: "BNP"  CBG: Recent Labs  Lab 08/02/22 0804 08/02/22 1121 08/02/22 1628 08/03/22 0036 08/03/22 0800  GLUCAP 96 109* 173* 140* 119*    Recent Results (from the past 240 hour(s))  SARS Coronavirus 2 by RT PCR (hospital order, performed in [facility] hospital lab) *cepheid single result test* Anterior Nasal Swab     Status: None   Collection Time: 07/31/22  1:56 PM   Specimen: Anterior Nasal Swab  Result Value Ref Range Status   SARS Coronavirus 2 by RT PCR NEGATIVE NEGATIVE Final    Comment: (NOTE) SARS-CoV-2 target nucleic acids are NOT DETECTED.  The SARS-CoV-2 RNA is generally detectable in upper and lower respiratory specimens during the acute phase of infection. The lowest concentration of SARS-CoV-2 viral copies this assay can detect is 250 copies / mL. A negative result does not preclude SARS-CoV-2 infection and should not be used as the sole basis for treatment or other patient management decisions.  A negative result may occur with improper specimen collection / handling, submission of specimen other than nasopharyngeal swab, presence of viral mutation(s) within the areas targeted by this assay, and inadequate number of viral copies (<250 copies / mL). A negative result must be combined with clinical observations, patient history, and epidemiological information.  Fact Sheet for Patients:   RoadLapTop.co.za  Fact Sheet for Healthcare Providers: http://kim-miller.com/  This test is not yet approved or  cleared by the [place] FDA and has been authorized for detection and/or diagnosis of SARS-CoV-2 by FDA under an Emergency Use Authorization (EUA).  This EUA will remain in effect (meaning this test can be used) for the duration of the COVID-19 declaration under Section 564(b)(1) of the Act, 21 U.S.C. section 360bbb-3(b)(1), unless the authorization is terminated or revoked sooner.  Performed at [facility], [address], [place], [place] [address]   Urine Culture (for pregnant, neutropenic or urologic patients or patients with an indwelling urinary catheter)     Status: Abnormal   Collection Time: 07/31/22  2:26 PM   Specimen: Urine, Clean Catch  Result Value Ref Range Status   Specimen Description   Final    URINE, CLEAN CATCH Performed at [facility], [address], [place], [place] [address]    Special Requests   Final    NONE Performed at [facility], [address] [place], [place] [address]    Culture MULTIPLE SPECIES PRESENT, SUGGEST RECOLLECTION (A)  Final   Report Status 08/02/2022 FINAL  Final  Culture, blood (Routine X 2) w Reflex to ID Panel     Status: None (Preliminary result)   Collection Time: 08/01/22 12:26 AM   Specimen: BLOOD LEFT ARM  Result Value Ref Range Status   Specimen Description   Final    BLOOD LEFT ARM BOTTLES DRAWN AEROBIC AND ANAEROBIC Performed at [facility], [address] [place], [place] [address]    Special Requests   Final    Blood Culture adequate volume Performed at [facility], [address], [place], [place] [address]    Culture   Final    NO GROWTH 1 DAY Performed at [facility], [address] [place], [place] [address]    Report Status PENDING  Incomplete  Culture, blood (Routine X 2) w Reflex to ID Panel     Status: None (Preliminary result)   Collection Time: 08/01/22 12:32 AM   Specimen: BLOOD LEFT HAND  Result Value Ref Range Status   Specimen Description   Final    BLOOD LEFT HAND BOTTLES DRAWN AEROBIC ONLY Performed at [facility], [address], [place], [place] [address]    Special Requests   Final    Blood Culture adequate volume Performed at [facility], [address], [place], [place] [address]    Culture   Final    NO GROWTH 1 DAY Performed at  [facility], [address], [place], [place] [address]    Report Status PENDING  Incomplete     Radiology Studies: No results found.   Pamella Pert, MD, PhD [facility]  Between 7 am - 7 pm I am available, please contact me via Amion (for emergencies) or Securechat (non urgent messages)  Between 7 pm - 7 am I am not available, please contact night coverage MD/APP via Amion

## 2022-08-03 NOTE — Plan of Care (Signed)

## 2022-08-03 NOTE — Progress Notes (Signed)
   08/03/22 1446  Spiritual Encounters  Type of Visit Initial  Care provided to: Patient  Conversation partners present during encounter Other (comment) (another patient)  Referral source Chaplain team  Reason for visit Routine spiritual support  OnCall Visit No  Spiritual Framework  Presenting Themes Significant life change;Caregiving needs  Patient Stress Factors Lack of caregivers;Health changes  Interventions  Spiritual Care Interventions Made Established relationship of care and support;Compassionate presence;Reflective listening  Intervention Outcomes  Outcomes Reduced anxiety;Reduced fear;Reduced isolation  Spiritual Care Plan  Spiritual Care Issues Still Outstanding Referral made to (name)  Follow up plan  Chaplain is referring continued care to chaplain team  Companion Sitter  Companion Sitter  Recommended  Companion reason Emotional support   Chaplain provided emotional support to patient primarily through compassionate presence. Chaplain listened and validated her needs. Patient was confused but requested someone to move her up in the bed because her back was hurting. Chaplain advocated for the patient's physical needs and notified staff. Chaplain will refer further patient support to the chaplain team.   Arlyce Dice, Chaplain Resident

## 2022-08-03 NOTE — TOC Progression Note (Addendum)
Transition of Care [facility]) - Progression Note    Patient Details  Name: Jennifer Cowan MRN: [id number] Date of Birth: [birth year]  [facility]) CM/SW Contact  Beckie Busing, RN Phone [phone]  08/03/2022, 3:33 PM  Clinical Narrative:    CM continues to attempt to reach family to discuss disposition planning. Attempted to contact niece  Roque Lias [phone] no answer voicemail left. Attempted to call sister Gar Gibbon  [phone] with no answer. Message has been left.         Expected Discharge Plan and Services                                               Social Determinants of Health (SDOH) Interventions SDOH Screenings   Food Insecurity: No Food Insecurity (06/27/2022)  Housing: Low Risk  (06/27/2022)  Transportation Needs: No Transportation Needs (06/27/2022)  Utilities: Not At Risk (06/27/2022)  Depression (PHQ2-9): High Risk (07/27/2022)  Tobacco Use: Low Risk  (07/29/2022)  Health Literacy: Low Risk  (02/06/2020)   Received from [facility], [facility]    Readmission Risk Interventions     No data to display

## 2022-08-03 NOTE — Evaluation (Signed)
Occupational Therapy Evaluation Patient Details Name: Jennifer Cowan MRN: [id number] DOB: 03-Oct-1945 Today's Date: 08/03/2022   History of Present Illness Jennifer Cowan is a 77 y.o. 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   Clinical Impression   The pt is currently limited by the below listed deficits (see OT problem list). She was a questionable historian, therefore it is not certain what her prior level of functioning was. As such, she requires increased assist for tasks such as dressing, toileting, and sit to stand using a RW. While she was seated EOB, she was noted to demo intermittent L sided leaning, requiring cues and min assist to correct. She repeatedly referenced having a lot of pain "all over", then she later emphasized having increased back pain. She was also noted to repeatedly yell out, "help me" and "Stanton Kidney", requiring frequent redirection to tasks. Without further OT services, she is at risk for restricted ADL participation and further deconditioning. Patient will benefit from continued inpatient follow up therapy, <3 hours/day.       Recommendations for follow up therapy are one component of a multi-disciplinary discharge planning process, led by the attending physician.  Recommendations may be updated based on patient status, additional functional criteria and insurance authorization.   Assistance Recommended at Discharge Frequent or constant Supervision/Assistance  Patient can return home with the following A lot of help with walking and/or transfers;A lot of help with bathing/dressing/bathroom;Assistance with cooking/housework;Direct supervision/assist  for medications management    Functional Status Assessment  Patient has had a recent decline in their functional status and demonstrates the ability to make significant improvements in function in a reasonable and predictable amount of time.  Equipment Recommendations  Other (comment) (to be determined pending progress at next level of care)    Recommendations for Other Services       Precautions / Restrictions Precautions Precautions: Fall Restrictions Weight Bearing Restrictions: No      Mobility Bed Mobility Overal bed mobility: Needs Assistance Bed Mobility: Supine to Sit, Sit to Supine Rolling: Min assist   Supine to sit: Mod assist Sit to supine: Mod assist   General bed mobility comments: required increased time and effort, as well as general cues for sequencing/transfer technique    Transfers Overall transfer level: Needs assistance Equipment used: Rolling walker (2 wheels) Transfers: Sit to/from Stand Sit to Stand: Mod assist           General transfer comment: She required cues for hand placement as well as upright posture in standing. She took lateral steps towards the head of the bed using a RW, requiring assist for steadying and cues for RW advancement      Balance Overall balance assessment: Needs assistance     Sitting balance - Comments: static sitting-fair. dynamic sitting-min assist     Standing balance-Leahy Scale: Poor              ADL either performed or assessed with clinical judgement   ADL Overall ADL's : Needs assistance/impaired Eating/Feeding: Sitting;Minimal assistance Eating/Feeding Details (indicate cue type and reason): She drank water from a cup several times seated EOB, though she needed this OT to place the cup in her hand before she would initiate task. She also required verbal cues to  initiate. Grooming: Minimal assistance;Sitting;Cueing for sequencing Grooming Details (indicate cue type and reason): based on clinical  judgement         Upper Body Dressing : Moderate assistance;Sitting   Lower Body Dressing: Maximal assistance;Sitting/lateral leans       Toileting- Clothing Manipulation and Hygiene: Maximal assistance Toileting - Clothing Manipulation Details (indicate cue type and reason): based on clinical judgement                          Pertinent Vitals/Pain Pain Assessment Pain Assessment: 0-10 Pain Score: 10-Worst pain ever Pain Location: "all over", then she later specified having increased back pain Pain Descriptors / Indicators: Discomfort, Guarding, Grimacing, Moaning Pain Intervention(s): Limited activity within patient's tolerance, Monitored during session, Other (comment) (Nurse stated she recently gave the pt pain medicine)     Hand Dominance Right   Extremity/Trunk Assessment Upper Extremity Assessment Upper Extremity Assessment: Generalized weakness   Lower Extremity Assessment Lower Extremity Assessment: Generalized weakness       Communication     Cognition Arousal/Alertness: Awake/alert Behavior During Therapy: Anxious Overall Cognitive Status: No family/caregiver present to determine baseline cognitive functioning Area of Impairment: Orientation, Attention, Memory, Following commands, Awareness, Problem solving                 Orientation Level: Disoriented to, Time, Situation   Memory: Decreased short-term memory Following Commands: Follows one step commands inconsistently     Problem Solving: Slow processing, Decreased initiation, Difficulty sequencing, Requires verbal cues General Comments: Oriented to person and to being in the hospital. Disoriented to city, as she reported being in [place]. She reported the current year to be "1947."                Home Living Family/patient expects to be discharged to:: Unsure Living Arrangements: Other relatives;Other (Comment) (Sister)   Type of Home: House       Home Layout: Two  level Alternate Level Stairs-Number of Steps: She stated her bedroom is upstairs.             Home Equipment: Gilmer Mor - single point   Additional Comments: Pt was a questionable historian, therefore information contained reagarding her prior level of functioning and living situation should be verified.      Prior Functioning/Environment               Mobility Comments: She initially reported using a cane vs. no AD for ambulation, then later reported using a RW. ADLs Comments: She reported being independent with ADLs. She stated her sister cooks, cleans, and drives.        OT Problem List: Decreased strength;Decreased activity tolerance;Impaired balance (sitting and/or standing);Decreased cognition;Decreased safety awareness;Decreased knowledge of use of DME or AE;Pain      OT Treatment/Interventions: Self-care/ADL training;Therapeutic exercise;Energy conservation;DME and/or AE instruction;Therapeutic activities;Cognitive remediation/compensation;Patient/family education;Balance training    OT Goals(Current goals can be found in the care plan section) Acute Rehab OT Goals OT Goal Formulation: Patient unable to participate in goal setting Time For Goal Achievement: 08/17/22 Potential to Achieve Goals: Fair ADL Goals Pt Will Perform Grooming: with set-up;sitting Pt Will Perform Upper Body Dressing: with set-up;sitting Pt Will Perform Lower Body Dressing: with supervision;sit to/from stand Pt Will Transfer to Toilet: with supervision;ambulating;grab bars Pt Will Perform Toileting - Clothing Manipulation and hygiene: with supervision;sit to/from stand  OT Frequency: Min 1X/week       AM-PAC OT "6 Clicks" Daily Activity  Outcome Measure Help from another person eating meals?: A Little Help from another person taking care of personal grooming?: A Little Help from another person toileting, which includes using toliet, bedpan, or urinal?: A Lot Help from another person  bathing (including washing, rinsing, drying)?: A Lot Help from another person to put on and taking off regular upper body clothing?: A Lot Help from another person to put on and taking off regular lower body clothing?: A Lot 6 Click Score: 14   End of Session Equipment Utilized During Treatment: Rolling walker (2 wheels) Nurse Communication: Patient requests pain meds  Activity Tolerance: Patient limited by pain Patient left: in bed;with call bell/phone within reach;with bed alarm set  OT Visit Diagnosis: Unsteadiness on feet (R26.81);Muscle weakness (generalized) (M62.81);Pain                Time: 8657-8469 OT Time Calculation (min): 26 min Charges:  OT General Charges $OT Visit: 1 Visit OT Evaluation $OT Eval Moderate Complexity: 1 Mod OT Treatments $Therapeutic Activity: 8-22 mins    Reuben Likes, OTR/L 08/03/2022, 4:56 PM

## 2022-08-03 NOTE — Progress Notes (Signed)
Pt continues to scream and be agitated through out night.  Upon rounds pt found with legs hanging off side off bed and completely naked.  No notification from tele sitter all night until tele sitter monitor removed from room.  This method is not effective in meeting the patients safety needs, tele sitter discontinued per nursing judgement.  Will continue to monitor.

## 2022-08-03 NOTE — Plan of Care (Signed)
   Problem: Education: Goal: Ability to describe self-care measures that may prevent or decrease complications (Diabetes Survival Skills Education) will improve Outcome: Progressing   

## 2022-08-03 NOTE — NC FL2 (Signed)
MEDICAID FL2 LEVEL OF CARE FORM     IDENTIFICATION  Patient Name: Jennifer Cowan Birthdate: [birth year] Sex: female Admission Date (Current Location): 07/31/2022  [facility] and [place] Number:  Producer, television/film/video and Address:  [facility],  [address] [place], [place] [address]      Provider Number: [id number]  Attending Physician Name and Address:  Leatha Gilding, MD  Relative Name and Phone Number:  niece Roque Lias    Current Level of Care: Hospital Recommended Level of Care: Skilled Nursing Facility Prior Approval Number:    Date Approved/Denied:   PASRR Number: [id number]  Discharge Plan: SNF    Current Diagnoses: Patient Active Problem List   Diagnosis Date Noted   Cerebral edema (HCC) 08/02/2022   Weakness 08/01/2022   UTI (urinary tract infection) 07/31/2022   Renal calculi 07/31/2022   Microscopic hematuria 07/31/2022   Acute encephalopathy 07/31/2022   Breast cancer of upper-inner quadrant of right female breast (HCC) 06/27/2022   Genetic testing 02/28/2022   Family history of breast cancer 02/17/2022   Stroke (cerebrum) (HCC) 12/16/2021   Type 2 diabetes mellitus (HCC) 10/06/2021   Port-A-Cath in place 08/30/2021   Hyperglycemia 08/30/2021   Malignant neoplasm of unspecified part of unspecified bronchus or lung (HCC) 08/13/2021   Physical deconditioning 07/27/2021   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   Mass of upper lobe of left lung 07/25/2021   Metastasis to brain [facility]) 07/24/2021   Overweight 07/07/2021   Osteopenia 01/12/2021   History of breast cancer 03/05/2020   Hypertension associated with type 2 diabetes mellitus (HCC) 06/26/2018   Iron deficiency anemia 06/26/2018   Hyperlipidemia associated with type 2 diabetes mellitus (HCC) 06/26/2018   Primary osteoarthritis involving multiple joints 06/26/2018    Orientation RESPIRATION BLADDER Height & Weight     Self (pt not oriented to place, time, self,  situation)  Normal Incontinent (intermittently) Weight: 71.7 kg Height:  5\' 8"  (172.7 cm)  BEHAVIORAL SYMPTOMS/MOOD NEUROLOGICAL BOWEL NUTRITION STATUS     (NA) Continent Diet (carb modified)  AMBULATORY STATUS COMMUNICATION OF NEEDS Skin   Limited Assist (unable to determine) Verbally Normal                       Personal Care Assistance Level of Assistance  Dressing     Dressing Assistance: Limited assistance     Functional Limitations Info  Sight, Hearing, Speech Sight Info: Adequate Hearing Info: Adequate Speech Info: Adequate    SPECIAL CARE FACTORS FREQUENCY  PT (By licensed PT), OT (By licensed OT)     PT Frequency: 5X per week OT Frequency: 5X per week            Contractures Contractures Info: Not present    Additional Factors Info  Code Status, Allergies, Psychotropic, Insulin Sliding Scale, Isolation Precautions, Suctioning Needs Code Status Info: FULL Allergies Info: NKA Psychotropic Info: see d/c summary Insulin Sliding Scale Info: see d/c summary Isolation Precautions Info: NA Suctioning Needs: NA   Current Medications (08/03/2022):  This is the current hospital active medication list Current Facility-Administered Medications  Medication Dose Route Frequency Provider Last Rate Last Admin   acetaminophen (TYLENOL) tablet 650 mg  650 mg Oral Q6H PRN John Giovanni, MD   650 mg at 08/02/22 1427   Or   acetaminophen (TYLENOL) suppository 650 mg  650 mg Rectal Q6H PRN John Giovanni, MD       atenolol (TENORMIN) tablet 12.5 mg  12.5  mg Oral Daily John Giovanni, MD   12.5 mg at 08/03/22 1110   atorvastatin (LIPITOR) tablet 80 mg  80 mg Oral QHS John Giovanni, MD   80 mg at 08/03/22 0037   cefTRIAXone (ROCEPHIN) 1 g in sodium chloride 0.9 % 100 mL IVPB  1 g Intravenous Q24H John Giovanni, MD 200 mL/hr at 08/03/22 0042 1 g at 08/03/22 0042   Chlorhexidine Gluconate Cloth 2 % PADS 6 each  6 each Topical Daily John Giovanni, MD    6 each at 08/03/22 1111   dexamethasone (DECADRON) injection 6 mg  6 mg Intravenous Q6H Leeroy Bock, MD   6 mg at 08/03/22 1352   heparin injection 5,000 Units  5,000 Units Subcutaneous Q8H John Giovanni, MD   5,000 Units at 08/03/22 1352   insulin aspart (novoLOG) injection 0-9 Units  0-9 Units Subcutaneous TID WC John Giovanni, MD   2 Units at 08/02/22 1821   levETIRAcetam (KEPPRA) tablet 500 mg  500 mg Oral BID John Giovanni, MD   500 mg at 08/03/22 1110   morphine (PF) 2 MG/ML injection 1-2 mg  1-2 mg Intravenous Q4H PRN Luiz Iron, NP   2 mg at 08/03/22 1111   naloxone (NARCAN) injection 0.4 mg  0.4 mg Intravenous PRN John Giovanni, MD       sodium chloride flush (NS) 0.9 % injection 10-40 mL  10-40 mL Intracatheter PRN John Giovanni, MD   10 mL at 08/03/22 1117   Facility-Administered Medications Ordered in Other Encounters  Medication Dose Route Frequency Provider Last Rate Last Admin   acetaminophen (TYLENOL) 325 MG tablet            cyanocobalamin (VITAMIN B12) 1000 MCG/ML injection            diphenhydrAMINE (BENADRYL) 25 mg capsule            gadopiclenol (VUEWAY) 0.5 MMOL/ML solution 7.5 mL  7.5 mL Intravenous Once PRN Margaretmary Dys, MD         Discharge Medications: Please see discharge summary for a list of discharge medications.  Relevant Imaging Results:  Relevant Lab Results:   Additional Information SS# [id number]  Harriett Sine, RN

## 2022-08-04 ENCOUNTER — Inpatient Hospital Stay (HOSPITAL_COMMUNITY): Payer: 59

## 2022-08-04 DIAGNOSIS — R319 Hematuria, unspecified: Secondary | ICD-10-CM | POA: Diagnosis not present

## 2022-08-04 DIAGNOSIS — Z515 Encounter for palliative care: Secondary | ICD-10-CM

## 2022-08-04 DIAGNOSIS — G936 Cerebral edema: Secondary | ICD-10-CM

## 2022-08-04 DIAGNOSIS — R52 Pain, unspecified: Secondary | ICD-10-CM

## 2022-08-04 DIAGNOSIS — G9340 Encephalopathy, unspecified: Secondary | ICD-10-CM | POA: Diagnosis not present

## 2022-08-04 DIAGNOSIS — Z7189 Other specified counseling: Secondary | ICD-10-CM

## 2022-08-04 DIAGNOSIS — Z853 Personal history of malignant neoplasm of breast: Secondary | ICD-10-CM

## 2022-08-04 DIAGNOSIS — C3490 Malignant neoplasm of unspecified part of unspecified bronchus or lung: Secondary | ICD-10-CM

## 2022-08-04 DIAGNOSIS — N390 Urinary tract infection, site not specified: Secondary | ICD-10-CM | POA: Diagnosis not present

## 2022-08-04 DIAGNOSIS — C7931 Secondary malignant neoplasm of brain: Secondary | ICD-10-CM

## 2022-08-04 DIAGNOSIS — Z79899 Other long term (current) drug therapy: Secondary | ICD-10-CM

## 2022-08-04 LAB — GLUCOSE, CAPILLARY
Glucose-Capillary: 149 mg/dL — ABNORMAL HIGH (ref 70–99)
Glucose-Capillary: 136 mg/dL — ABNORMAL HIGH (ref 70–99)
Glucose-Capillary: 146 mg/dL — ABNORMAL HIGH (ref 70–99)

## 2022-08-04 LAB — PROTIME-INR
INR: 1.2 (ref 0.8–1.2)
Prothrombin Time: 15.5 seconds — ABNORMAL HIGH (ref 11.4–15.2)

## 2022-08-04 LAB — APTT: aPTT: 29 seconds (ref 24–36)

## 2022-08-04 MED ORDER — MORPHINE SULFATE (PF) 2 MG/ML IV SOLN
2.0000 mg | INTRAVENOUS | Status: DC | PRN
  Administered 2022-08-04 – 2022-08-06 (×6): 2 mg via INTRAVENOUS
  Filled 2022-08-04 (×6): qty 1

## 2022-08-04 MED ORDER — OXYCODONE HCL 5 MG PO TABS: 5.0000 mg | ORAL_TABLET | ORAL | Status: DC | PRN

## 2022-08-04 MED ORDER — CEFDINIR 300 MG PO CAPS
300.0000 mg | ORAL_CAPSULE | Freq: Two times a day (BID) | ORAL | Status: DC
  Administered 2022-08-04: 300 mg via ORAL
  Filled 2022-08-04 (×5): qty 1

## 2022-08-04 MED ORDER — DEXAMETHASONE SODIUM PHOSPHATE 10 MG/ML IJ SOLN
6.0000 mg | Freq: Three times a day (TID) | INTRAMUSCULAR | Status: DC
  Administered 2022-08-04 – 2022-08-07 (×9): 6 mg via INTRAVENOUS
  Filled 2022-08-04 (×9): qty 1

## 2022-08-04 MED ORDER — LIDOCAINE 5 % EX PTCH
2.0000 | MEDICATED_PATCH | CUTANEOUS | Status: DC
  Administered 2022-08-04 – 2022-08-09 (×6): 2 via TRANSDERMAL
  Filled 2022-08-04 (×7): qty 2

## 2022-08-04 MED ORDER — IBUPROFEN 800 MG PO TABS
800.0000 mg | ORAL_TABLET | Freq: Three times a day (TID) | ORAL | Status: DC
  Administered 2022-08-04 (×2): 800 mg via ORAL
  Filled 2022-08-04 (×3): qty 1

## 2022-08-04 MED ORDER — ACETAMINOPHEN 500 MG PO TABS
1000.0000 mg | ORAL_TABLET | Freq: Three times a day (TID) | ORAL | Status: DC
  Administered 2022-08-04 – 2022-08-09 (×14): 1000 mg via ORAL
  Filled 2022-08-04 (×14): qty 2

## 2022-08-04 MED ORDER — HEPARIN (PORCINE) 25000 UT/250ML-% IV SOLN
1250.0000 [IU]/h | INTRAVENOUS | Status: DC
  Administered 2022-08-04: 1250 [IU]/h via INTRAVENOUS
  Filled 2022-08-04 (×2): qty 250

## 2022-08-04 MED ORDER — SODIUM CHLORIDE (PF) 0.9 % IJ SOLN
INTRAMUSCULAR | Status: AC
  Filled 2022-08-04: qty 50

## 2022-08-04 MED ORDER — IOHEXOL 300 MG/ML  SOLN
75.0000 mL | Freq: Once | INTRAMUSCULAR | Status: AC | PRN
  Administered 2022-08-04: 75 mL via INTRAVENOUS

## 2022-08-04 MED ORDER — OXYCODONE HCL 5 MG PO TABS
5.0000 mg | ORAL_TABLET | ORAL | Status: DC | PRN
  Administered 2022-08-04 – 2022-08-06 (×8): 5 mg via ORAL
  Filled 2022-08-04 (×9): qty 1

## 2022-08-04 NOTE — Progress Notes (Signed)
PT Cancellation Note  Patient Details Name: Jennifer Cowan MRN: [id number] DOB: 07/20/1945   Cancelled Treatment:    Reason Eval/Treat Not Completed: Pain limiting ability to participate Patient yelling out, tating in pain. Will follow and  attempt pt when  [patent able. Note Palliative  consult Pending.  Blanchard Kelch PT Acute Rehabilitation Services Office [phone] Weekend [phone]   Rada Hay 08/04/2022, 8:50 AM

## 2022-08-04 NOTE — Progress Notes (Signed)
PROGRESS NOTE  Jennifer Cowan NFA:[id number] DOB: [birth year] DOA: 07/31/2022 PCP: [name], FNP   LOS: 4 days   Brief Narrative / Interim history: 77 y.o. 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  In the ED, patient was slightly tachycardic but afebrile.  Not hypotensive.  Labs showing no leukocytosis or anemia, potassium 3.4, creatinine 0.5, ammonia level <10, SARS-CoV-2 PCR negative. UA showing negative nitrite, moderate leukocytes, and microscopy showing 6-10 RBCs, 11-20 WBCs, and no bacteria.  Chest x-ray showing small left pleural effusion. Head CT with no significant change in extensive vasogenic edema throughout the right temporal and parietal lobes, and involving the right thalamus and basal ganglia. Mild right-to-left midline shift measuring 5 mm also shows no significant change.   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 Case was discussed with Dr  Marlou Porch, did not recommend any intervention for now. -Urine cultures have been negative.  Has been maintained on ceftriaxone, switched to oral equivalent to complete a 5-day course 2 days remaining  Active problems  Acute encephalopathy, in-hospital delirium, lung cancer with brain metastases, breast cancer - CT head showing stable extensive vasogenic edema and stable mild right to left midline shift measuring 5 mm.  No evidence of intracranial hemorrhage on CT. Continue Keppra, Decadron, 6mg  q6hr IV until discharge.  Per Dr. Barbaraann Cao- Once improved, can dc on 4mg  BID PO steroids and follow up with him outpatient. He will plan to repeat brain MRI Aug 8th. If she doesn't improve within 1-2 day on this treatment, can repeat the brain MRI then while inpatient.  -Attempt to wean off more the Decadron due to in-hospital delirium   Generalized weakness - In the setting of known cancer and UTI.  Also she was previously on steroids which were tapered off a week ago which could be contributing. 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  Patient had moderate pericardial effusion on echo done in February 2024 which had essentially resolved on repeat echo done in April.  CT showing trace pericardial effusion.   Left pleural effusion  -Patient had tiny left pleural effusion on CT back in February 2024 and no thoracentesis was performed.  CT showing trace left pleural effusion.  No respiratory distress or hypoxia.   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   Hyperlipidemia - Continue Lipitor.   Hypertension - Blood pressure stable.  Continue atenolol.   Diet controlled type 2 diabetes - A1c  5.9 in June 2023. 6.2 this admission and has been on steroids long-term. Continue SSI  Lab Results  Component Value Date   HGBA1C 6.2 (H) 08/01/2022   CBG (last 3)  Recent Labs    08/03/22 1115 08/03/22 1636  08/04/22 0741  GLUCAP 135* 129* 149*   Scheduled Meds:  acetaminophen  1,000 mg Oral Q8H   atenolol  12.5 mg Oral Daily   atorvastatin  80 mg Oral QHS   Chlorhexidine Gluconate Cloth  6 each Topical Daily   dexamethasone (DECADRON) injection  6 mg Intravenous Q8H   heparin  5,000 Units Subcutaneous Q8H   ibuprofen  800 mg Oral Q8H   insulin aspart  0-9 Units Subcutaneous TID WC   levETIRAcetam  500 mg Oral BID   lidocaine  2 patch Transdermal Q24H   QUEtiapine  12.5 mg Oral BID   Continuous Infusions:  cefTRIAXone (ROCEPHIN)  IV 1 g (08/03/22 2329)   PRN Meds:.morphine injection, naLOXone (NARCAN)  injection, oxyCODONE, sodium chloride flush  Current Outpatient Medications  Medication Instructions   Accu-Chek Softclix Lancets lancets Use to check blood sugar as directed 4 times a day   acetaminophen (TYLENOL) 1,000 mg, Oral, Every 6 hours PRN   amLODipine (NORVASC) 10 MG tablet TAKE 1 TABLET BY MOUTH DAILY   aspirin (EQ ASPIRIN) 325 mg, Oral, Daily   atenolol (TENORMIN) 12.5 mg, Oral, Daily   atorvastatin (LIPITOR) 80 mg, Oral, Daily   benazepril (LOTENSIN) 10 MG tablet TAKE 1 TABLET BY MOUTH DAILY   blood glucose meter kit and supplies KIT Dispense based on patient and insurance preference. Use up to four times daily as directed.   Blood Glucose Monitoring Suppl (ACCU-CHEK GUIDE) w/Device KIT Use as directed 4 times a day   ferrous sulfate 325 mg, Oral, Daily with breakfast   gabapentin (NEURONTIN) 300 mg, Oral, 2 times daily   glucose blood (ACCU-CHEK GUIDE) test strip Use as directed 4 times a day   ketoconazole (NIZORAL) 2 % cream 1 Application, Topical, See admin instructions, Apply to both feet 2 times a day   levETIRAcetam (KEPPRA) 500 mg, Oral, 2 times daily   lidocaine-prilocaine (EMLA) cream 1 Application, Topical, As needed   meloxicam (MOBIC) 7.5 MG tablet TAKE 1 TABLET BY MOUTH DAILY   ondansetron (ZOFRAN) 8 mg, Oral, Every 8 hours PRN   oxyCODONE (OXY  IR/ROXICODONE) 5-10 mg, Oral, Every 4 hours PRN   prochlorperazine (COMPAZINE) 10 mg, Oral, Every 6 hours PRN   simvastatin (ZOCOR) 40 mg, Oral, Daily   spironolactone (ALDACTONE) 12.5 mg, Oral, Daily   traMADol (ULTRAM) 50 mg, Oral, Every 6 hours PRN   Vitamin D3 5,000 Units, Oral, Daily    Diet Orders (From admission, onward)     Start     Ordered   07/31/22 2328  Diet Carb Modified Fluid consistency: Thin; Room service appropriate? Yes  Diet effective now       Question Answer Comment  Diet-HS Snack? Nothing   Calorie Level Medium 1600-2000   Fluid consistency: Thin   Room service appropriate? Yes      07/31/22 2332            DVT prophylaxis: heparin injection 5,000 Units Start: 08/01/22 0600   Lab Results  Component Value Date   PLT 430 (H) 08/03/2022      Code Status: Full Code  Family Communication: no family at bedside  Status is: Inpatient Remains inpatient appropriate because: severity of illness, confusion  Level of care:  Telemetry  Consultants:  none  Objective: Vitals:   08/03/22 0043 08/03/22 0541 08/03/22 1444 08/03/22 2144  BP: (!) 133/92 100/60 133/89 125/85  Pulse: 95 84 85 86  Resp: 20 17 18 17   Temp: 98.3 F (36.8 C) (!) 97.5 F (36.4 C) 97.8 F (36.6 C)   TempSrc: Oral Oral Oral   SpO2: 93% 94% 98% 95%  Weight:      Height:       No intake or output data in the 24 hours ending 08/04/22 1102 Wt Readings from Last 3 Encounters:  07/31/22 71.7 kg  07/27/22 69.2 kg  07/18/22 67.8 kg    Examination:  Constitutional: NAD Eyes: lids and conjunctivae normal, no scleral icterus ENMT: mmm Neck: normal, supple Respiratory: clear to auscultation bilaterally, no wheezing, no crackles. Normal respiratory effort.  Cardiovascular: Regular rate and rhythm, no murmurs / rubs / gallops. No LE edema. Abdomen: soft, no distention, no tenderness. Bowel sounds positive.   Data Reviewed: I have independently reviewed following labs and  imaging studies   CBC Recent Labs  Lab 07/31/22 1330 08/01/22 0032 08/02/22 0432 08/03/22 0555  WBC 5.4 5.4 6.0 5.3  HGB 12.7 12.0 11.7* 11.6*  HCT 39.3 37.9 36.7 36.2  PLT 394 395 420* 430*  MCV 98.5 99.0 100.0 98.9  MCH 31.8 31.3 31.9 31.7  MCHC 32.3 31.7 31.9 32.0  RDW 14.7 14.7 14.7 14.6  LYMPHSABS 0.5*  --   --   --   MONOABS 0.7  --   --   --   EOSABS 0.1  --   --   --   BASOSABS 0.0  --   --   --     Recent Labs  Lab 07/31/22 1330 07/31/22 1347 08/01/22 0032 08/02/22 0432 08/03/22 0555  NA 138  --  139 141 139  K 3.4*  --  3.7 3.5 3.6  CL 106  --  108 112* 108  CO2 23  --  19* 23 23  GLUCOSE 122*  --  138* 116* 128*  BUN 9  --  9 14 11   CREATININE 0.53  --  0.50 0.62 0.50  CALCIUM 8.6*  --  8.6* 8.6* 8.7*  AST 19  --   --   --   --   ALT 23  --   --   --   --   ALKPHOS 68  --   --   --   --   BILITOT 0.8  --   --   --   --   ALBUMIN 3.0*  --   --   --   --   MG  --   --  2.0  --   --   HGBA1C  --   --  6.2*  --   --   AMMONIA  --  <10  --   --   --     ------------------------------------------------------------------------------------------------------------------ No results for input(s): "CHOL", "HDL", "LDLCALC", "TRIG", "CHOLHDL", "LDLDIRECT" in the last 72 hours.  Lab Results  Component Value Date   HGBA1C 6.2 (H) 08/01/2022   ------------------------------------------------------------------------------------------------------------------ No results for input(s): "TSH", "T4TOTAL", "T3FREE", "THYROIDAB" in the last 72 hours.  Invalid input(s): "FREET3"  Cardiac Enzymes No results for input(s): "CKMB", "TROPONINI", "MYOGLOBIN" in the last 168 hours.  Invalid input(s): "CK" ------------------------------------------------------------------------------------------------------------------ No results found for: "BNP"  CBG: Recent Labs  Lab 08/03/22 0036 08/03/22 0800 08/03/22 1115 08/03/22 1636 08/04/22 0741  GLUCAP 140* 119* 135* 129*  149*  Recent Results (from the past 240 hour(s))  SARS Coronavirus 2 by RT PCR (hospital order, performed in [facility] hospital lab) *cepheid single result test* Anterior Nasal Swab     Status: None   Collection Time: 07/31/22  1:56 PM   Specimen: Anterior Nasal Swab  Result Value Ref Range Status   SARS Coronavirus 2 by RT PCR NEGATIVE NEGATIVE Final    Comment: (NOTE) SARS-CoV-2 target nucleic acids are NOT DETECTED.  The SARS-CoV-2 RNA is generally detectable in upper and lower respiratory specimens during the acute phase of infection. The lowest concentration of SARS-CoV-2 viral copies this assay can detect is 250 copies / mL. A negative result does not preclude SARS-CoV-2 infection and should not be used as the sole basis for treatment or other patient management decisions.  A negative result may occur with improper specimen collection / handling, submission of specimen other than nasopharyngeal swab, presence of viral mutation(s) within the areas targeted by this assay, and inadequate number of viral copies (<250 copies / mL). A negative result must be combined with clinical observations, patient history, and epidemiological information.  Fact Sheet for Patients:   RoadLapTop.co.za  Fact Sheet for Healthcare Providers: http://kim-miller.com/  This test is not yet approved or  cleared by the [place] FDA and has been authorized for detection and/or diagnosis of SARS-CoV-2 by FDA under an Emergency Use Authorization (EUA).  This EUA will remain in effect (meaning this test can be used) for the duration of the COVID-19 declaration under Section 564(b)(1) of the Act, 21 U.S.C. section 360bbb-3(b)(1), unless the authorization is terminated or revoked sooner.  Performed at [facility], [address], [place], [place] [address]   Urine Culture (for pregnant, neutropenic or urologic patients or patients  with an indwelling urinary catheter)     Status: Abnormal   Collection Time: 07/31/22  2:26 PM   Specimen: Urine, Clean Catch  Result Value Ref Range Status   Specimen Description   Final    URINE, CLEAN CATCH Performed at [facility], [address], [place], [place] [address]    Special Requests   Final    NONE Performed at [facility], [address] [place], [place] [address]    Culture MULTIPLE SPECIES PRESENT, SUGGEST RECOLLECTION (A)  Final   Report Status 08/02/2022 FINAL  Final  Culture, blood (Routine X 2) w Reflex to ID Panel     Status: None (Preliminary result)   Collection Time: 08/01/22 12:26 AM   Specimen: BLOOD LEFT ARM  Result Value Ref Range Status   Specimen Description   Final    BLOOD LEFT ARM BOTTLES DRAWN AEROBIC AND ANAEROBIC Performed at [facility], [address] [place], [place] [address]    Special Requests   Final    Blood Culture adequate volume Performed at [facility], [address], [place], [place] [address]    Culture   Final    NO GROWTH 2 DAYS Performed at [facility], [address] [place], [place] [address]    Report Status PENDING  Incomplete  Culture, blood (Routine X 2) w Reflex to ID Panel     Status: None (Preliminary result)   Collection Time: 08/01/22 12:32 AM   Specimen: BLOOD LEFT HAND  Result Value Ref Range Status   Specimen Description   Final    BLOOD LEFT HAND BOTTLES DRAWN AEROBIC ONLY Performed at [facility], [address] [place], [place] [address]    Special Requests   Final    Blood Culture adequate volume Performed at [facility], [address] [place], [place] [address]    Culture   Final    NO GROWTH 2 DAYS Performed at [facility], [address] [place], [place] [address]    Report Status PENDING  Incomplete     Radiology Studies: No results found.   Pamella Pert, MD, PhD [facility]  Between 7 am - 7 pm I am available, please contact me via Amion (for emergencies) or Securechat (non urgent messages)  Between 7 pm - 7 am I am not available, please contact night coverage MD/APP via Amion

## 2022-08-04 NOTE — TOC Progression Note (Signed)
Transition of Care [facility]) - Progression Note    Patient Details  Name: Jennifer Cowan MRN: [id number] Date of Birth: Jul 30, 1945  Transition of [facility] CM/SW Contact  Harriett Sine, RN Phone Number: 08/04/2022, 11:17 AM  Clinical Narrative:     Spoke with pt about SNF short term care with limitations of her insurance plan and the plan to return home with sister Jennifer Cowan. She agreed and was feeling sleepy and wants [name] to come back later to talk. Spoke with pt's niece Jennifer Cowan about SNF placement and plans after SNF stay. The niece Jennifer Cowan and the pt agreed that the plan is to return home with sister Jennifer Cowan) after care is complete at [facility]. The niece requested if possible SNF placement be in the [place] area, [place] is acceptable as a second choice. Bed requests sent.     Expected Discharge Plan and Services                                               Social Determinants of Health (SDOH) Interventions SDOH Screenings   Food Insecurity: No Food Insecurity (06/27/2022)  Housing: Low Risk  (06/27/2022)  Transportation Needs: No Transportation Needs (06/27/2022)  Utilities: Not At Risk (06/27/2022)  Depression (PHQ2-9): High Risk (07/27/2022)  Tobacco Use: Low Risk  (07/29/2022)  Health Literacy: Low Risk  (02/06/2020)   Received from [facility], [facility]    Readmission Risk Interventions     No data to display

## 2022-08-04 NOTE — Progress Notes (Signed)
ANTICOAGULATION CONSULT NOTE - Initial Consult  Pharmacy Consult for Heparin Indication: pulmonary embolus  No Known Allergies  Patient Measurements: Height: 5\' 8"  (172.7 cm) Weight: 71.7 kg (158 lb) IBW/kg (Calculated) : 63.9 Heparin Dosing Weight: 71.7 kg (actual weight)  Vital Signs: BP: 136/86 (07/18 1433) Pulse Rate: 81 (07/18 1433)  Labs: Recent Labs    08/02/22 0432 08/03/22 0555  HGB 11.7* 11.6*  HCT 36.7 36.2  PLT 420* 430*  CREATININE 0.62 0.50    Estimated Creatinine Clearance: 60.4 mL/min (by C-G formula based on SCr of 0.5 mg/dL).   Medical History: Past Medical History:  Diagnosis Date   Anemia    Arthritis    Breast cancer (HCC)    Cancer (HCC) 1999   Breast Cancer   Family history of breast cancer    Hyperlipidemia    Hypertension    Osteopenia 01/12/2021   Personal history of malignant neoplasm of breast     Baseline Labs: 7/17: Hgb = 11.6, low 7/17: Plt =430, elevated 7/17: Scr = 0.5  Medications:  - No anticoagulation PTA - Heparin 5000 units sq every 8 hours (last dose 7/18 1525) >> will discontinue   Assessment: Patient is a 77 y.o. 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    Presented to the ED 07/31/2022 for evaluation of altered mental status/confusion, poor appetite, and generalized weakness.   07/25/2022 CT Chest:  acute PE with moderate thrombus burden and right ventricular strain  Goal of Therapy:  Heparin level 0.3-0.7 units/ml Monitor platelets by anticoagulation protocol: Yes   Plan:  - Start IV  heparin infusion at 1250 units/hr (no bolus per MD) - Check anti-Xa level in 8 hours and daily while on heparin - Daily CBC - ordered baseline aPTT and INR - Continue to monitor H&H and platelets - Monitor for signs/symptoms of bleeding  Raju Coppolino Tylene Fantasia 08/04/2022,5:25 PM

## 2022-08-04 NOTE — Consult Note (Signed)
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  Since being admitted patient has been managed with antibiotics for suspected urinary tract infection.  Hospitalization complicated due to delirium.  Patient also being followed by oncology/neuro-oncology.  Palliative medicine team consulted to assist with complex medical decision making. Of note, patient last seen by outpatient palliative medicine team at [facility] on 6/3./4.   Extensive review of EMR prior to presenting to bedside.  At time of EMR review patient is receiving IV dexamethasone 6 mg every 6 hours scheduled, Seroquel 12.5 mg twice daily scheduled, and IV morphine 2 mg as needed x 5 doses Currently plan is for patient to attend rehab after hospitalization.  Presented to bedside to meet with patient.  Patient yelling out in bed when initially presenting though easily calmed down and redirectable once presenting to bedside.  Patient has present at bedside though no family.  To introduce myself and the role of the palliative medicine team.  Patient's primary concern focus at this time is her ongoing  back pain.  Patient notes she has tried NSAIDs at home without relief.  Patient notes she has previously received "pain meds"/opioids though these have not managed her pain.  Tried to inquire about IV morphine patient has been receiving for pain though she is not able to detail benefits and or relief of this medication.  Discussed multimodal approach to pain management for back pain which patient is agreeing with.  Noted patient's priority right now was pain management so was not able to delve into complex medical decision making.  Noted palliative medicine team and continue to follow along to engage in conversations as able.  Patient voiced appreciation for visit today.  Followed up with IDT after visit with patient including RN and hospitalist.  Hospitalist able to reach out to neuro all regarding decreasing steroid dose as concerned this can contribute to patient's delirium.  So obtaining further imaging of back for workup to make sure not metastatic disease causing worsening pain.   Primary Diagnoses  Present on Admission:  UTI (urinary tract infection)  Metastasis to brain [facility])  Malignant neoplasm of unspecified part of unspecified bronchus or lung (HCC)   Palliative Review of Systems: Back pain  Past Medical History:  Diagnosis Date   Anemia    Arthritis    Breast cancer (HCC)    Cancer (HCC) 1999   Breast Cancer   Family history of breast cancer    Hyperlipidemia    Hypertension    Osteopenia 01/12/2021   Personal history of malignant neoplasm of breast    Social History   Socioeconomic History   Marital status: Single    Spouse name: Not on file   Number of  children: 0   Years of education: 12   Highest education level: High school graduate  Occupational History   Occupation: retired  Tobacco Use   Smoking status: Never   Smokeless tobacco: Never  Vaping Use   Vaping status: Never Used  Substance and Sexual Activity   Alcohol use: No   Drug use: Never   Sexual  activity: Yes    Birth control/protection: Post-menopausal  Other Topics Concern   Not on file  Social History Narrative   Not on file   Social Determinants of Health   Financial Resource Strain: Not on file  Food Insecurity: No Food Insecurity (06/27/2022)   Hunger Vital Sign    Worried About Running Out of Food in the Last Year: Never true    Ran Out of Food in the Last Year: Never true  Transportation Needs: No Transportation Needs (06/27/2022)   PRAPARE - Administrator, Civil Service (Medical): No    Lack of Transportation (Non-Medical): No  Physical Activity: Not on file  Stress: Not on file  Social Connections: Not on file   Family History  Problem Relation Age of Onset   Arthritis Mother    Breast cancer Sister 86   Diabetes Sister    Hyperlipidemia Sister    Hypertension Sister    Stroke Sister    Dementia Sister    Diabetes Brother    Hypertension Brother    Breast cancer Niece 31   Scheduled Meds:  atenolol  12.5 mg Oral Daily   atorvastatin  80 mg Oral QHS   Chlorhexidine Gluconate Cloth  6 each Topical Daily   dexamethasone (DECADRON) injection  6 mg Intravenous Q6H   heparin  5,000 Units Subcutaneous Q8H   insulin aspart  0-9 Units Subcutaneous TID WC   levETIRAcetam  500 mg Oral BID   QUEtiapine  12.5 mg Oral BID   Continuous Infusions:  cefTRIAXone (ROCEPHIN)  IV 1 g (08/03/22 2329)   PRN Meds:.acetaminophen **OR** acetaminophen, morphine injection, naLOXone (NARCAN)  injection, oxyCODONE, sodium chloride flush No Known Allergies CBC:    Component Value Date/Time   WBC 5.3 08/03/2022 0555   HGB 11.6 (L) 08/03/2022 0555   HGB 13.8 07/18/2022 1409   HGB 15.7 07/07/2021 1538   HCT 36.2 08/03/2022 0555   HCT 46.6 07/07/2021 1538   PLT 430 (H) 08/03/2022 0555   PLT 247 07/18/2022 1409   PLT 450 07/07/2021 1538   MCV 98.9 08/03/2022 0555   MCV 88 07/07/2021 1538   NEUTROABS 4.1 07/31/2022 1330   NEUTROABS 3.7 07/07/2021 1538    LYMPHSABS 0.5 (L) 07/31/2022 1330   LYMPHSABS 1.6 07/07/2021 1538   MONOABS 0.7 07/31/2022 1330   EOSABS 0.1 07/31/2022 1330   EOSABS 0.1 07/07/2021 1538   BASOSABS 0.0 07/31/2022 1330   BASOSABS 0.0 07/07/2021 1538   Comprehensive Metabolic Panel:    Component Value Date/Time   NA 139 08/03/2022 0555   NA 141 07/07/2021 1538   K 3.6 08/03/2022 0555   CL 108 08/03/2022 0555   CO2 23 08/03/2022 0555   BUN 11 08/03/2022 0555   BUN 9 07/07/2021 1538   CREATININE 0.50 08/03/2022 0555   CREATININE 0.52 07/18/2022 1409   GLUCOSE 128 (H) 08/03/2022 0555   CALCIUM 8.7 (L) 08/03/2022 0555   AST 19 07/31/2022 1330   AST 16 07/18/2022 1409   ALT 23 07/31/2022 1330   ALT 27 07/18/2022 1409   ALKPHOS 68 07/31/2022 1330   BILITOT 0.8  07/31/2022 1330   BILITOT 0.6 07/18/2022 1409   PROT 6.3 (L) 07/31/2022 1330   PROT 7.9 07/07/2021 1538   ALBUMIN 3.0 (L) 07/31/2022 1330   ALBUMIN 4.6 07/07/2021 1538    Physical Exam: Vital Signs: BP 125/85   Pulse 86   Temp 97.8 F (36.6 C) (Oral)   Resp 17   Ht 5\' 8"  (1.727 m)   Wt 71.7 kg   SpO2 95%   BMI 24.02 kg/m  SpO2: SpO2: 95 % O2 Device: O2 Device: Room Air O2 Flow Rate:   Intake/output summary: No intake or output data in the 24 hours ending 08/04/22 0948 LBM: Last BM Date : 07/30/22 Baseline Weight: Weight: 71.7 kg Most recent weight: Weight: 71.7 kg  General: yelling out initially though easily calmed and redirected, confused at times, awake, laying in bed Eyes:no drainage noted HENT: moist mucous membranes Cardiovascular: RRR Respiratory: no increased work of breathing noted, not in respiratory distress Skin: no rashes or lesions on visible skin Neuro: confused at times though able to engage in some simple conversation   Palliative Performance Scale: 40%              Additional Data Reviewed: Recent Labs    08/02/22 0432 08/03/22 0555  WBC 6.0 5.3  HGB 11.7* 11.6*  PLT 420* 430*  NA 141 139  BUN 14 11   CREATININE 0.62 0.50    Imaging: CT ABDOMEN PELVIS W WO CONTRAST CLINICAL DATA:  Microscopic hematuria, metastatic lung cancer and right breast cancer  * Tracking Code: BO *  EXAM: CT ABDOMEN AND PELVIS WITHOUT AND WITH CONTRAST  TECHNIQUE: Multidetector CT imaging of the abdomen and pelvis was performed following the standard protocol before and following the bolus administration of intravenous contrast.  RADIATION DOSE REDUCTION: This exam was performed according to the departmental dose-optimization program which includes automated exposure control, adjustment of the mA and/or kV according to patient size and/or use of iterative reconstruction technique.  CONTRAST:  OMNIPAQUE IOHEXOL 300 MG/ML  SOLN  COMPARISON:  PET-CT 05/05/2022  FINDINGS: Despite efforts by the technologist and patient, motion artifact is present on today's exam and could not be eliminated. This reduces exam sensitivity and specificity.  Lower chest: Mild atelectasis posteriorly in the left lower lobe.  Right breast collection with cutaneous thickening in the right medial breast, and stranding in the soft tissues of the right chest wall lateral to the collection.  Left anterior descending and right coronary artery atherosclerosis. Mitral and aortic valve calcifications. Trace pericardial effusion. Trace left pleural effusion.  Hepatobiliary: Simple caudate lobe cyst 5.2 by 4.6 cm. No further imaging workup of this lesion is indicated.  Similar simple cysts in segment 3 of the liver. No further imaging workup of these lesions is indicated.  Mildly contracted gallbladder.  Pancreas: Unremarkable  Spleen: Unremarkable  Adrenals/Urinary Tract: Adrenal glands normal. Substantial scarring in the right kidney upper pole along with a 2.2 cm staghorn calculus.  Benign right kidney lower pole cyst 1.7 cm in diameter on image 26 series 19. No further imaging workup of this lesion is  indicated.  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  1.0 cm left kidney lower pole stone on image 32 series 13.  Urinary bladder unremarkable. No significant abnormal filling defect or abnormal enhancement along the urothelium.  Stomach/Bowel: Unremarkable  Vascular/Lymphatic: Atherosclerosis is present, including aortoiliac atherosclerotic disease.  Reproductive: Multiple uterine fibroids are again observed.  Other: No supplemental non-categorized findings.  Musculoskeletal: Spurring along the femoral necks and femoral heads bilaterally. Lumbar spondylosis and degenerative disc disease with resulting bilateral foraminal impingement at L4-5.  IMPRESSION: 1. Substantial scarring in the right kidney upper pole along with a 2.2 cm staghorn calculus. 2. 1.0 cm left kidney lower pole stone. 3. 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 4. Right breast collection with cutaneous thickening in the right medial breast, and stranding in the soft tissues of the right chest wall lateral to the collection. 5. Trace pericardial effusion and trace left pleural effusion. 6. Aortic and coronary atherosclerosis. Mitral and aortic valve calcifications. 7. Multiple uterine fibroids. 8. Lumbar spondylosis and degenerative disc disease with resulting bilateral foraminal impingement at L4-5.  Aortic Atherosclerosis (ICD10-I70.0).  Electronically Signed   By: Gaylyn Rong M.D.   On: 07/31/2022 17:17 CT Head Wo Contrast CLINICAL DATA:  Altered mental status.  Metastatic lung carcinoma  EXAM: CT HEAD WITHOUT CONTRAST  TECHNIQUE: Contiguous axial images were obtained from the base of the skull through the vertex without intravenous contrast.  RADIATION DOSE REDUCTION: This exam was performed according to the departmental dose-optimization program which includes automated exposure  control, adjustment of the mA and/or kV according to patient size and/or use of iterative reconstruction technique.  COMPARISON:  CT on 06/11/2022  FINDINGS: Brain: No evidence of intracranial hemorrhage. Extensive vasogenic edema throughout the right temporal and parietal lobes and involving the right thalamus and basal ganglia shows no significant change. Mass effect on the right lateral and mild midline shift measuring 5 mm also shows no significant change. Mild chronic small vessel disease again noted.  Vascular:  No hyperdense vessel or other acute findings.  Skull: No evidence of fracture or other significant bone abnormality.  Sinuses/Orbits:  No acute findings.  Other: None.  IMPRESSION: No significant change in extensive vasogenic edema throughout the right temporal and parietal lobes, and involving the right thalamus and basal ganglia. Mild right-to-left midline shift measuring 5 mm also shows no significant change.  No evidence of intracranial hemorrhage.  Electronically Signed   By: Danae Orleans M.D.   On: 07/31/2022 16:55 DG CHEST PORT 1 VIEW CLINICAL DATA:  Worsening altered mental status. History of metastatic lung cancer.  EXAM: PORTABLE CHEST 1 VIEW  COMPARISON:  July 30, 2021  FINDINGS: Injectable port terminates at the cavoatrial junction.  Normal cardiac silhouette.  Small left pleural effusion. Post treatment changes in the left upper lobe. The right lung is clear.  IMPRESSION: 1. Small left pleural effusion. 2. Post treatment changes in the left upper lobe.  Electronically Signed   By: Ted Mcalpine M.D.   On: 07/31/2022 14:38    I personally reviewed recent imaging.   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  Since being admitted patient has been managed with antibiotics for suspected urinary tract infection.  Hospitalization complicated due to delirium.  Patient also being followed by oncology/neuro-oncology.  Palliative medicine team consulted to assist with complex medical decision making. Of note, patient last seen by outpatient palliative medicine team at [facility] on 6/3./4.   # Complex medical decision making/goals of care  -Patient noted to have delirium with waxing and waning mental status with inattention during hospitalization.  Patient engaged easily in conversation today though forgetful at times.  Noted priority of pain management at this time so unable to engage in complex medical decision making.  Palliative medicine team will continue to follow and engage in supporting complex medical decision making as able moving forward.  -  Code Status: Full Code   # Symptom management  -Pain, back (chronic vs acute)   -Agree with obtaining further imaging to make sure pain not secondary to new metastatic lesion.   -Start Tylenol 1000 mg every 8 hours scheduled during the day.   -Start ibuprofen 800 mg every 8 hours scheduled during the day x 6 doses.    -Start lidocaine patch to back   -Start oxycodone 5 mg every 4 hours as needed for breakthrough pain management.   -Change IV morphine 2 mg to every 4 hours as needed for breakthrough pain after oral medications.   -Delirium Non-pharmacologic Delirium Precautions  - Frequent re-orientation; update board aw/  date, names of treatment team  - Daytime stimulation: Open curtains, turn lights on, and turn on television as tolerated during waking hours  - Nighttime calm - close curtains, turn lights off, and turn off television at 9pm  with minimal interruptions from treatment team during sleeping hours, including avoiding lab draws if possible  - Encourage continued presence of family/friends  - Occupy w/ distractions - e.g. ask them to fold washcloths, busy-board  - Encourage movement with PT/OT as tolerated  - Ensure adequate sensorium, to include providing glasses, dentures, and hearing aids to patient as appropriate  - Ensure adequate nutrition and fluid intake  - Assess and treat pain (incl nonverbal cues) - Assess and treat constipation and urinary retention  - Ensure hydration, electrolyte balance, adequate oxygenation  - Review medications (addition, deletion, changes can trigger)    -Discussed with hosptialist and neuro-onc agreeing with decrease in steroids which can worsen delirium.   -Receiving Seroquel 15.5mg  BID    -EKG on 7/10 showed Qtc at 432.   # Psycho-social/Spiritual Support:  - Support System: niece- Phil Dopp, sisterElease Hashimoto   # Discharge Planning:  Skilled Nursing Facility for rehab with Palliative care service follow-up  -Patient already being seen by outpatient palliative medicine team at [facility]. Discussed with outpatient PMT provider.   Thank you for allowing the palliative care team to participate in the care Jennifer Cowan.  Alvester Morin, DO Palliative Care Provider PMT # [phone]  If patient remains symptomatic despite maximum doses, please call PMT at [phone] between 0700 and 1900. Outside of these hours, please call attending, as PMT does not have night coverage.  *Please note that this is a verbal dictation therefore any spelling or grammatical errors are due to the "[employer]" system interpretation.

## 2022-08-05 ENCOUNTER — Other Ambulatory Visit (HOSPITAL_COMMUNITY): Payer: Self-pay

## 2022-08-05 DIAGNOSIS — G9340 Encephalopathy, unspecified: Secondary | ICD-10-CM | POA: Diagnosis not present

## 2022-08-05 DIAGNOSIS — N390 Urinary tract infection, site not specified: Secondary | ICD-10-CM | POA: Diagnosis not present

## 2022-08-05 DIAGNOSIS — K5900 Constipation, unspecified: Secondary | ICD-10-CM

## 2022-08-05 DIAGNOSIS — R319 Hematuria, unspecified: Secondary | ICD-10-CM | POA: Diagnosis not present

## 2022-08-05 DIAGNOSIS — Z7189 Other specified counseling: Secondary | ICD-10-CM | POA: Diagnosis not present

## 2022-08-05 DIAGNOSIS — Z515 Encounter for palliative care: Secondary | ICD-10-CM | POA: Diagnosis not present

## 2022-08-05 LAB — CBC
HCT: 39.4 % (ref 36.0–46.0)
Hemoglobin: 12.5 g/dL (ref 12.0–15.0)
MCH: 31.3 pg (ref 26.0–34.0)
MCHC: 31.7 g/dL (ref 30.0–36.0)
MCV: 98.5 fL (ref 80.0–100.0)
Platelets: 539 10*3/uL — ABNORMAL HIGH (ref 150–400)
RBC: 4 MIL/uL (ref 3.87–5.11)
RDW: 14.4 % (ref 11.5–15.5)
WBC: 10.2 10*3/uL (ref 4.0–10.5)
nRBC: 0 % (ref 0.0–0.2)

## 2022-08-05 LAB — HEPARIN LEVEL (UNFRACTIONATED)
Heparin Unfractionated: >1.1 IU/mL — ABNORMAL HIGH (ref 0.30–0.70)
Heparin Unfractionated: >1.1 IU/mL — ABNORMAL HIGH (ref 0.30–0.70)

## 2022-08-05 LAB — CULTURE, BLOOD (ROUTINE X 2): Special Requests: ADEQUATE

## 2022-08-05 LAB — GLUCOSE, CAPILLARY
Glucose-Capillary: 122 mg/dL — ABNORMAL HIGH (ref 70–99)
Glucose-Capillary: 144 mg/dL — ABNORMAL HIGH (ref 70–99)
Glucose-Capillary: 142 mg/dL — ABNORMAL HIGH (ref 70–99)

## 2022-08-05 LAB — BASIC METABOLIC PANEL
Anion gap: 6 (ref 5–15)
BUN: 17 mg/dL (ref 8–23)
CO2: 25 mmol/L (ref 22–32)
Calcium: 9.2 mg/dL (ref 8.9–10.3)
Chloride: 105 mmol/L (ref 98–111)
Creatinine, Ser: 0.78 mg/dL (ref 0.44–1.00)
GFR, Estimated: >60 mL/min (ref >60)
Glucose, Bld: 147 mg/dL — ABNORMAL HIGH (ref 70–99)
Potassium: 3.9 mmol/L (ref 3.5–5.1)
Sodium: 136 mmol/L (ref 135–145)

## 2022-08-05 LAB — MAGNESIUM: Magnesium: 2.2 mg/dL (ref 1.7–2.4)

## 2022-08-05 MED ORDER — POLYETHYLENE GLYCOL 3350 17 G PO PACK
17.0000 g | PACK | Freq: Two times a day (BID) | ORAL | Status: DC
  Administered 2022-08-05 – 2022-08-09 (×9): 17 g via ORAL
  Filled 2022-08-05 (×8): qty 1

## 2022-08-05 MED ORDER — SENNA 8.6 MG PO TABS
2.0000 | ORAL_TABLET | Freq: Two times a day (BID) | ORAL | Status: DC
  Administered 2022-08-05 – 2022-08-09 (×9): 17.2 mg via ORAL
  Filled 2022-08-05 (×8): qty 2

## 2022-08-05 MED ORDER — HEPARIN (PORCINE) 25000 UT/250ML-% IV SOLN
800.0000 [IU]/h | INTRAVENOUS | Status: DC
  Administered 2022-08-06: 800 [IU]/h via INTRAVENOUS
  Filled 2022-08-05: qty 250

## 2022-08-05 MED ORDER — SENNA 8.6 MG PO TABS
ORAL_TABLET | ORAL | Status: AC
  Filled 2022-08-05: qty 2

## 2022-08-05 MED ORDER — FENTANYL 12 MCG/HR TD PT72
MEDICATED_PATCH | TRANSDERMAL | Status: AC
  Filled 2022-08-05: qty 1

## 2022-08-05 MED ORDER — POLYETHYLENE GLYCOL 3350 17 G PO PACK
PACK | ORAL | Status: AC
  Filled 2022-08-05: qty 1

## 2022-08-05 MED ORDER — HEPARIN (PORCINE) 25000 UT/250ML-% IV SOLN
1100.0000 [IU]/h | INTRAVENOUS | Status: DC
  Administered 2022-08-05 (×2): 1100 [IU]/h via INTRAVENOUS

## 2022-08-05 MED ORDER — CEFADROXIL 500 MG PO CAPS
500.0000 mg | ORAL_CAPSULE | Freq: Two times a day (BID) | ORAL | Status: AC
  Administered 2022-08-05 – 2022-08-06 (×4): 500 mg via ORAL
  Filled 2022-08-05 (×4): qty 1

## 2022-08-05 MED ORDER — FENTANYL 12 MCG/HR TD PT72
1.0000 | MEDICATED_PATCH | TRANSDERMAL | Status: DC
  Administered 2022-08-05: 1 via TRANSDERMAL

## 2022-08-05 NOTE — Progress Notes (Signed)
OT Cancellation Note  Patient Details Name: Jennifer Cowan MRN: [id number] DOB: August 30, 1945   Cancelled Treatment:    Reason Eval/Treat Not Completed: Medical issues which prohibited therapy. OT spoke with the pt's nurse who stated the pt was found to have an acute PE with anticoagulation initiated at ~6:00 p.m. yesterday. Therapy to hold for now and follow up at later date.       Reuben Likes, OTR/L 08/05/2022, 12:09 PM

## 2022-08-05 NOTE — TOC Progression Note (Signed)
Transition of Care [facility]) - Progression Note    Patient Details  Name: Jennifer Cowan MRN: [id number] Date of Birth: March 14, 1945  Transition of Care [facility] CM/SW Contact  Beckie Busing, RN Phone Number:[phone]  08/05/2022, 11:40 AM  Clinical Narrative:    TOC continues to follow for disposition needs. Patient is currently not medically stable for discharge. TOC following for discharge readiness.         Expected Discharge Plan and Services                                               Social Determinants of Health (SDOH) Interventions SDOH Screenings   Food Insecurity: No Food Insecurity (06/27/2022)  Housing: Low Risk  (06/27/2022)  Transportation Needs: No Transportation Needs (06/27/2022)  Utilities: Not At Risk (06/27/2022)  Depression (PHQ2-9): High Risk (07/27/2022)  Tobacco Use: Low Risk  (07/29/2022)  Health Literacy: Low Risk  (02/06/2020)   Received from [facility]    Readmission Risk Interventions     No data to display

## 2022-08-05 NOTE — Care Management Important Message (Signed)
Important Message  Patient Details IM Letter given. Name: Jennifer Cowan MRN: [id number] Date of Birth: 1945-05-27   Medicare Important Message Given:  Yes     Caren Macadam 08/05/2022, 9:47 AM

## 2022-08-05 NOTE — Progress Notes (Signed)
PROGRESS NOTE  Jennifer Cowan NWG:[id number] DOB: 1945/06/23 DOA: 07/31/2022 PCP: [name], FNP   LOS: 5 days   Brief Narrative / Interim history: 77 y.o. 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  In the ED, patient was slightly tachycardic but afebrile.  Not hypotensive.  Labs showing no leukocytosis or anemia, potassium 3.4, creatinine 0.5, ammonia level <10, SARS-CoV-2 PCR negative. UA showing negative nitrite, moderate leukocytes, and microscopy showing 6-10 RBCs, 11-20 WBCs, and no bacteria.  Chest x-ray showing small left pleural effusion. Head CT with no significant change in extensive vasogenic edema throughout the right temporal and parietal lobes, and involving the right thalamus and basal ganglia. Mild right-to-left midline shift measuring 5 mm also shows no significant change.   Subjective / 24h Interval events: Complains of back pain   Assesement and Plan: Principal Problem:   UTI (urinary tract infection) Active Problems:   Metastasis to brain [facility])   History of breast cancer   Malignant neoplasm of unspecified part of unspecified bronchus or lung (HCC)   Type 2 diabetes mellitus (HCC)   Renal calculi   Microscopic hematuria   Acute encephalopathy   Weakness   Cerebral edema (HCC)   Pain   High risk medication use   Counseling and coordination of care   Palliative care encounter   Principal problem UTI with hematuria , renal calculi -  CT on admission with 2.2 cm 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 Case was discussed with Dr Marlou Porch, did not recommend any intervention for now. -Urine cultures have been negative.  Has been maintained on ceftriaxone, switched to oral equivalent to complete a 5-day course 2 days remaining  Active problems  Acute pulmonary embolism - probably causing her back pain. Discussed with Dr Laurell Josephs and Dr Al Pimple, started on anticoagulation. Closely monitor with her history of brain hemorrhage. Most recent CT scan without evidence of bleeding. Monitor, perhaps switch to NOAC this weekend if stable  Acute encephalopathy, in-hospital delirium, lung cancer with brain metastases, breast cancer - CT head showing stable extensive vasogenic edema and stable mild right to left midline shift measuring 5 mm.  No evidence of intracranial hemorrhage on CT. Continue Keppra, Decadron, 6mg  q6hr IV until discharge.  Per Dr. Barbaraann Cao- Once improved, can dc on 4mg  BID PO steroids and follow up with him outpatient. He will plan to repeat brain MRI Aug 8th. If she doesn't improve within 1-2 day on this treatment, can repeat the brain MRI then while inpatient.  -Attempt to wean off more the Decadron due to in-hospital delirium   Generalized weakness - In the setting of known cancer and UTI.  Also she was previously on steroids which were tapered off a week ago which could be contributing. 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  Patient had moderate pericardial effusion on echo done in February 2024 which had essentially resolved on repeat echo done in April.  CT showing trace pericardial effusion.   Left pleural effusion  -Patient had  tiny left pleural effusion on CT back in February 2024 and no thoracentesis was performed.  CT showing trace left pleural effusion.    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   Hyperlipidemia -  Continue Lipitor.   Hypertension - Blood pressure stable.  Continue atenolol.   Diet controlled type 2 diabetes - A1c 5.9 in June 2023. 6.2 this admission and has been on steroids long-term. Continue SSI  Lab Results  Component Value Date   HGBA1C 6.2 (H) 08/01/2022   CBG (last 3)  Recent Labs    08/04/22 1218 08/04/22 1615 08/05/22 0740  GLUCAP 136* 146* 122*   Scheduled Meds:  acetaminophen  1,000 mg Oral Q8H   atenolol  12.5 mg Oral Daily   atorvastatin  80 mg Oral QHS   cefadroxil  500 mg Oral BID   Chlorhexidine Gluconate Cloth  6 each Topical Daily   dexamethasone (DECADRON) injection  6 mg Intravenous Q8H   ibuprofen  800 mg Oral Q8H   insulin aspart  0-9 Units Subcutaneous TID WC   levETIRAcetam  500 mg Oral BID   lidocaine  2 patch Transdermal Q24H   QUEtiapine  12.5 mg Oral BID   Continuous Infusions:  heparin     PRN Meds:.morphine injection, naLOXone (NARCAN)  injection, oxyCODONE, sodium chloride flush  Current Outpatient Medications  Medication Instructions   Accu-Chek Softclix Lancets lancets Use to check blood sugar as directed 4 times a day   acetaminophen (TYLENOL) 1,000 mg, Oral, Every 6 hours PRN   amLODipine (NORVASC) 10 MG tablet TAKE 1 TABLET BY MOUTH DAILY   aspirin (EQ ASPIRIN) 325 mg, Oral, Daily   atenolol (TENORMIN) 12.5 mg, Oral, Daily   atorvastatin (LIPITOR) 80 mg, Oral, Daily   benazepril (LOTENSIN) 10 MG tablet TAKE 1 TABLET BY MOUTH DAILY   blood glucose meter kit and supplies KIT Dispense based on patient and insurance preference. Use up to four times daily as directed.   Blood Glucose Monitoring Suppl (ACCU-CHEK GUIDE) w/Device KIT Use as directed 4 times a day   ferrous sulfate 325 mg, Oral, Daily with breakfast   gabapentin (NEURONTIN) 300 mg, Oral, 2 times daily   glucose blood (ACCU-CHEK GUIDE) test strip Use as directed 4 times a day   ketoconazole (NIZORAL) 2 % cream 1 Application, Topical, See admin instructions, Apply to  both feet 2 times a day   levETIRAcetam (KEPPRA) 500 mg, Oral, 2 times daily   lidocaine-prilocaine (EMLA) cream 1 Application, Topical, As needed   meloxicam (MOBIC) 7.5 MG tablet TAKE 1 TABLET BY MOUTH DAILY   ondansetron (ZOFRAN) 8 mg, Oral, Every 8 hours PRN   oxyCODONE (OXY IR/ROXICODONE) 5-10 mg, Oral, Every 4 hours PRN   prochlorperazine (COMPAZINE) 10 mg, Oral, Every 6 hours PRN   simvastatin (ZOCOR) 40 mg, Oral, Daily   spironolactone (ALDACTONE) 12.5 mg, Oral, Daily   traMADol (ULTRAM) 50 mg, Oral, Every 6 hours PRN   Vitamin D3 5,000 Units, Oral, Daily    Diet Orders (From admission, onward)     Start     Ordered   07/31/22 2328  Diet Carb Modified Fluid consistency: Thin; Room service appropriate? Yes  Diet effective now       Question Answer Comment  Diet-HS Snack? Nothing   Calorie Level Medium 1600-2000   Fluid consistency: Thin   Room service appropriate? Yes      07/31/22 2332  DVT prophylaxis:    Lab Results  Component Value Date   PLT 539 (H) 08/05/2022      Code Status: Full Code  Family Communication: no family at bedside, updated niece over the phone  Status is: Inpatient Remains inpatient appropriate because: severity of illness, confusion  Level of care: Telemetry  Consultants:  none  Objective: Vitals:   08/03/22 2144 08/04/22 1433 08/04/22 2120 08/05/22 0445  BP: 125/85 136/86 125/83 114/64  Pulse: 86 81 70 71  Resp: 17 18 16 17   Temp:   97.6 F (36.4 C) 97.7 F (36.5 C)  TempSrc:   Oral Oral  SpO2: 95% 96% 97% 95%  Weight:      Height:       No intake or output data in the 24 hours ending 08/05/22 0914 Wt Readings from Last 3 Encounters:  07/31/22 71.7 kg  07/27/22 69.2 kg  07/18/22 67.8 kg    Examination:  Constitutional: NAD Eyes: lids and conjunctivae normal, no scleral icterus ENMT: mmm Neck: normal, supple Respiratory: clear to auscultation bilaterally, no wheezing, no crackles. Normal respiratory  effort.  Cardiovascular: Regular rate and rhythm, no murmurs / rubs / gallops. No LE edema. Abdomen: soft, no distention, no tenderness. Bowel sounds positive.   Data Reviewed: I have independently reviewed following labs and imaging studies   CBC Recent Labs  Lab 07/31/22 1330 08/01/22 0032 08/02/22 0432 08/03/22 0555 08/05/22 0435  WBC 5.4 5.4 6.0 5.3 10.2  HGB 12.7 12.0 11.7* 11.6* 12.5  HCT 39.3 37.9 36.7 36.2 39.4  PLT 394 395 420* 430* 539*  MCV 98.5 99.0 100.0 98.9 98.5  MCH 31.8 31.3 31.9 31.7 31.3  MCHC 32.3 31.7 31.9 32.0 31.7  RDW 14.7 14.7 14.7 14.6 14.4  LYMPHSABS 0.5*  --   --   --   --   MONOABS 0.7  --   --   --   --   EOSABS 0.1  --   --   --   --   BASOSABS 0.0  --   --   --   --     Recent Labs  Lab 07/31/22 1330 07/31/22 1347 08/01/22 0032 08/02/22 0432 08/03/22 0555 08/04/22 1750 08/05/22 0435  NA 138  --  139 141 139  --  136  K 3.4*  --  3.7 3.5 3.6  --  3.9  CL 106  --  108 112* 108  --  105  CO2 23  --  19* 23 23  --  25  GLUCOSE 122*  --  138* 116* 128*  --  147*  BUN 9  --  9 14 11   --  17  CREATININE 0.53  --  0.50 0.62 0.50  --  0.78  CALCIUM 8.6*  --  8.6* 8.6* 8.7*  --  9.2  AST 19  --   --   --   --   --   --   ALT 23  --   --   --   --   --   --   ALKPHOS 68  --   --   --   --   --   --   BILITOT 0.8  --   --   --   --   --   --   ALBUMIN 3.0*  --   --   --   --   --   --   MG  --   --  2.0  --   --   --  2.2  INR  --   --   --   --   --  1.2  --   HGBA1C  --   --  6.2*  --   --   --   --   AMMONIA  --  <10  --   --   --   --   --     ------------------------------------------------------------------------------------------------------------------ No results for input(s): "CHOL", "HDL", "LDLCALC", "TRIG", "CHOLHDL", "LDLDIRECT" in the last 72 hours.  Lab Results  Component Value Date   HGBA1C 6.2 (H) 08/01/2022    ------------------------------------------------------------------------------------------------------------------ No results for input(s): "TSH", "T4TOTAL", "T3FREE", "THYROIDAB" in the last 72 hours.  Invalid input(s): "FREET3"  Cardiac Enzymes No results for input(s): "CKMB", "TROPONINI", "MYOGLOBIN" in the last 168 hours.  Invalid input(s): "CK" ------------------------------------------------------------------------------------------------------------------ No results found for: "BNP"  CBG: Recent Labs  Lab 08/03/22 1636 08/04/22 0741 08/04/22 1218 08/04/22 1615 08/05/22 0740  GLUCAP 129* 149* 136* 146* 122*    Recent Results (from the past 240 hour(s))  SARS Coronavirus 2 by RT PCR (hospital order, performed in [facility] hospital lab) *cepheid single result test* Anterior Nasal Swab     Status: None   Collection Time: 07/31/22  1:56 PM   Specimen: Anterior Nasal Swab  Result Value Ref Range Status   SARS Coronavirus 2 by RT PCR NEGATIVE NEGATIVE Final    Comment: (NOTE) SARS-CoV-2 target nucleic acids are NOT DETECTED.  The SARS-CoV-2 RNA is generally detectable in upper and lower respiratory specimens during the acute phase of infection. The lowest concentration of SARS-CoV-2 viral copies this assay can detect is 250 copies / mL. A negative result does not preclude SARS-CoV-2 infection and should not be used as the sole basis for treatment or other patient management decisions.  A negative result may occur with improper specimen collection / handling, submission of specimen other than nasopharyngeal swab, presence of viral mutation(s) within the areas targeted by this assay, and inadequate number of viral copies (<250 copies / mL). A negative result must be combined with clinical observations, patient history, and epidemiological information.  Fact Sheet for Patients:   RoadLapTop.co.za  Fact Sheet for Healthcare  Providers: http://kim-miller.com/  This test is not yet approved or  cleared by the [place] FDA and has been authorized for detection and/or diagnosis of SARS-CoV-2 by FDA under an Emergency Use Authorization (EUA).  This EUA will remain in effect (meaning this test can be used) for the duration of the COVID-19 declaration under Section 564(b)(1) of the Act, 21 U.S.C. section 360bbb-3(b)(1), unless the authorization is terminated or revoked sooner.  Performed at [facility], [address] [place], [place] [address]   Urine Culture (for pregnant, neutropenic or urologic patients or patients with an indwelling urinary catheter)     Status: Abnormal   Collection Time: 07/31/22  2:26 PM   Specimen: Urine, Clean Catch  Result Value Ref Range Status   Specimen Description   Final    URINE, CLEAN CATCH Performed at [facility], [address] [place], [place] [address]    Special Requests   Final    NONE Performed at [facility], [address], [place], [place] [address]    Culture MULTIPLE SPECIES PRESENT, SUGGEST RECOLLECTION (A)  Final   Report Status 08/02/2022 FINAL  Final  Culture, blood (Routine X 2) w Reflex to ID Panel     Status: None (Preliminary result)   Collection Time: 08/01/22 12:26 AM  Specimen: BLOOD LEFT ARM  Result Value Ref Range Status   Specimen Description   Final    BLOOD LEFT ARM BOTTLES DRAWN AEROBIC AND ANAEROBIC Performed at [facility], [address], [place], [place] [address]    Special Requests   Final    Blood Culture adequate volume Performed at [facility], [address] [place], [place] [address]    Culture   Final    NO GROWTH 4 DAYS Performed at [facility], [address] [place], [place] [address]    Report Status PENDING  Incomplete  Culture, blood (Routine X 2) w Reflex to ID Panel     Status: None  (Preliminary result)   Collection Time: 08/01/22 12:32 AM   Specimen: BLOOD LEFT HAND  Result Value Ref Range Status   Specimen Description   Final    BLOOD LEFT HAND BOTTLES DRAWN AEROBIC ONLY Performed at [facility], [address], [place], [place] [address]    Special Requests   Final    Blood Culture adequate volume Performed at [facility], [address], [place], [place] [address]    Culture   Final    NO GROWTH 4 DAYS Performed at [facility], [address], [place], [place] [address]    Report Status PENDING  Incomplete     Radiology Studies: CT CHEST W CONTRAST  Result Date: 08/04/2022 CLINICAL DATA:  Breast carcinoma, assess treatment response EXAM: CT CHEST WITH CONTRAST TECHNIQUE: Multidetector CT imaging of the chest was performed during intravenous contrast administration. RADIATION DOSE REDUCTION: This exam was performed according to the departmental dose-optimization program which includes automated exposure control, adjustment of the mA and/or kV according to patient size and/or use of iterative reconstruction technique. CONTRAST:  75mL OMNIPAQUE IOHEXOL 300 MG/ML  SOLN COMPARISON:  CT done on 02/24/2022, chest radiograph done on 07/31/2022 FINDINGS: Cardiovascular: There are intraluminal filling defects in pulmonary artery branches in right upper lobe, right middle lobe, right lower lobe, left upper lobe and left lower lobe. There is moderate thrombus burden. RV LV ratio is more than 1 suggesting right heart strain. There is minimal pericardial effusion. There is homogeneous enhancement in thoracic aorta. There are scattered calcifications in thoracic aorta and its major branches. Mediastinum/Nodes: No significant lymphadenopathy seen. There are low-density nodules in the thyroid measuring up to 9 mm in size in the left lobe. Lungs/Pleura: There is partial atelectasis in left upper lobe along the medial aspect with air  bronchograms with no interval change. Small linear densities in the posterior left mid lung field has not changed. In image 87 of series 6, there is a 4 mm nodule in left upper lobe with no interval change. There is interval clearing of left pleural effusion. There are no new infiltrates in the lung fields. There is no pneumothorax. Upper Abdomen: There are multiple smoothly marginated low-density lesions in the liver measuring up to 5.2 cm in diameter, possibly cysts. There is fat attenuation in the lateral midportion of left kidney with no interval change. Large right renal stones seen. Musculoskeletal: Degenerative changes are noted in cervical and thoracic spine. There are no focal lytic or sclerotic lesions. There is serous fluid correction in right breast, possibly postsurgical change. IMPRESSION: There are intraluminal filling defects in multiple pulmonary artery branches in both lungs. There is moderate thrombus burden. There is evidence of right ventricular strain. Atelectasis/scarring seen in the medial aspect of left lung  has not changed significantly. There are no new infiltrates. There is no pleural effusion or pneumothorax. There are no definite imaging signs of metastatic disease. Other chronic findings in the upper abdomen as described in the body of the report. Imaging finding of acute PE with moderate thrombus burden and right ventricular strain was relayed to patient's provider Dr. Elvera Lennox by telephone call. Electronically Signed   By: Ernie Avena M.D.   On: 08/04/2022 16:15     Pamella Pert, MD, PhD [facility]  Between 7 am - 7 pm I am available, please contact me via Amion (for emergencies) or Securechat (non urgent messages)  Between 7 pm - 7 am I am not available, please contact night coverage MD/APP via Amion

## 2022-08-05 NOTE — TOC Benefit Eligibility Note (Signed)
Pharmacy Patient Advocate Encounter  Insurance verification completed.    The patient is insured through [facility] Medicare Part D  Ran test claim for fentanyl (Duragesic) 12 mcg Hr patches and the current 30 day co-pay is $0.00.   This test claim was processed through [facility]- copay amounts may vary at other pharmacies due to pharmacy/plan contracts, or as the patient moves through the different stages of their insurance plan.    Roland Earl, CPHT Pharmacy Patient Advocate Specialist [facility] Patient Advocate Team Direct Number: [phone]  Fax: [phone]

## 2022-08-05 NOTE — Progress Notes (Signed)
PT Cancellation Note  Patient Details Name: DOMINGUE COLTRAIN MRN: [id number] DOB: [birth year]   Cancelled Treatment:     New acute PE Heparin stated but within 24 hour window.  Will attempt to see another day as schedule permits.  Pt has been evaluated with rec for SNF.   Felecia Shelling  PTA Acute  Rehabilitation Services Office M-F          [phone]

## 2022-08-05 NOTE — Progress Notes (Addendum)
Daily Progress Note   Patient Name: Jennifer Cowan       Date: 08/05/2022 DOB: 12/13/1945  Age: 77 y.o. MRN#: [id number] Attending Physician: Leatha Gilding, MD Primary Care Physician: [name], FNP Admit Date: 07/31/2022 Length of Stay: 5 days  Reason for Consultation/Follow-up: Establishing goals of care and Pain control  Subjective:   CC: Patient notes overall pain worsening. Following up regarding complex medical decision making and symptom management.   Subjective:  Reviewed EMR prior to presenting to bedside. At time of EMR review, patient had receive IV morphine 2mg  x2 doses and oxycodone 5mg  x2 doses.   Presented to bedside to meet with patient. Patient's RN present at bedside as well. Patient welcomed visit and remembered speaking with me yesterday. Patient noted oxycodone is helping with her pain though only last for about 1 hour. She does feel that her pain has progressed from being in her back to now being "all over". She cannot pinpoint one particular location of the pain.  Discussed with patient's medical status including metastatic cancer, could consider long actin opioid for [pain management. Patient agreeing with use of fentanyl patch if will be covered by insurance. Noted would reach out to pharmacy to confirm.    Inquired about patient's last BM which she states was a while ago, noted in EMR to be the 15th. Discussed adjustment bowel regimen orally today. If not BM by today, may need to consider suppository tomorrow. Patient agreeing with this plan.   All questions answered at that time, Noted palliative medicine team would continue to follow along with patient's journey.   Review of Systems pain Objective:   Vital Signs:  BP 114/64 (BP Location: Left Arm)   Pulse 71   Temp 97.7 F (36.5 C) (Oral)   Resp 17   Ht 5\' 8"  (1.727 m)   Wt 71.7 kg   SpO2 95%   BMI 24.02 kg/m   Physical Exam: General: awake, laying in bed, grimacing at times though  interactive Eyes:no drainage noted HENT: moist mucous membranes Cardiovascular: RRR Respiratory: no increased work of breathing noted, not in respiratory distress Skin: no rashes or lesions on visible skin Neuro: appropriately answering questions asked during visit  Imaging:  I personally reviewed recent imaging.   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  Since being admitted patient has been managed with antibiotics for suspected urinary tract infection.  Hospitalization complicated due to delirium.  Patient also being followed by oncology/neuro-oncology.  Palliative medicine team consulted to assist with complex medical decision making. Of note, patient last seen by outpatient palliative medicine team at [facility] on 6/3/4.   Recommendations/Plan: # Complex medical decision making/goals of care:  Patient's primary focus with this provider continues to be pain management so continuing to address this. Patient seen being followed by oncology for input regarding cancer directed therapies. Patient now on anticoagulation for new PE.   -  Code Status: Full Code  # Symptom management:  -Pain, in setting  of metastatic cancer   - Start fentanyl 59mcg/hr q72 hours patch. Discussed with patient and pharmacy to make sure $0 copay.                                -Continue Tylenol 1000 mg every 8 hours scheduled during  the day.                               -Discontinue ibuprofen since now on anticoagulation infusion.                                -Continue  lidocaine patch to back                               -Continue oxycodone 5 mg every 4 hours as needed for breakthrough pain management.                               -Continue IV morphine 2 mg to every 4 hours as needed for breakthrough pain after oral medications.    -Constipation   -Start senna 2 tabs BID   -Start miralax 17gm BID    -Consider use of suppository tomorrow if no BM today                 -Delirium, improving Non-pharmacologic Delirium Precautions  - Frequent re-orientation; update board aw/ date, names of treatment team  - Daytime stimulation: Open curtains, turn lights on, and turn on television as tolerated during waking hours  - Nighttime calm - close curtains, turn lights off, and turn off television at 9pm  with minimal interruptions from treatment team during sleeping hours, including avoiding lab draws if possible  - Encourage continued presence of family/friends  - Occupy w/ distractions - e.g. ask them to fold washcloths, busy-board  - Encourage movement with PT/OT as tolerated  - Ensure adequate sensorium, to include providing glasses, dentures, and hearing aids to patient as appropriate  - Ensure adequate nutrition and fluid intake  - Assess and treat pain (incl nonverbal cues) - Assess and treat constipation and urinary retention  - Ensure hydration, electrolyte balance, adequate oxygenation  - Review medications (addition, deletion, changes can trigger)                                -Steroids dose lowered on 7/18 to assist with management.                                -Receiving Seroquel 15.5mg  BID                                              -EKG on 7/10 showed Qtc at 432.   # Psychosocial Support:  - Support System: niece- Jennifer Cowan, sisterElease Cowan    # Discharge Planning:  Skilled Nursing Facility for rehab  with Palliative care service follow-up                -  Patient already being seen by outpatient palliative medicine team at [facility].  Discussed with: patient, RN, pharmacy, hospitalist   Thank you for allowing the palliative care team to participate in the care Jennifer Cowan.  Alvester Morin, DO Palliative Care Provider PMT # [phone]  If patient remains symptomatic despite maximum doses, please call PMT at [phone] between 0700 and 1900. Outside of these hours, please call attending, as PMT does not have night coverage.  *Please note that this is a verbal dictation therefore any spelling or grammatical errors are due to the "Dragon Medical One" system interpretation.

## 2022-08-05 NOTE — Progress Notes (Signed)
ANTICOAGULATION CONSULT NOTE   Pharmacy Consult for Heparin Indication: pulmonary embolus  No Known Allergies  Patient Measurements: Height: 5\' 8"  (172.7 cm) Weight: 71.7 kg (158 lb) IBW/kg (Calculated) : 63.9 Heparin Dosing Weight: TBW  Vital Signs: Temp: 97.8 F (36.6 C) (07/19 1330) Temp Source: Axillary (07/19 1330) BP: 119/81 (07/19 1330) Pulse Rate: 71 (07/19 1330)  Labs: Recent Labs    08/03/22 0555 08/04/22 1750 08/05/22 0435 08/05/22 0438  HGB 11.6*  --  12.5  --   HCT 36.2  --  39.4  --   PLT 430*  --  539*  --   APTT  --  29  --   --   LABPROT  --  15.5*  --   --   INR  --  1.2  --   --   HEPARINUNFRC  --   --   --  >1.10*  CREATININE 0.50  --  0.78  --     Estimated Creatinine Clearance: 60.4 mL/min (by C-G formula based on SCr of 0.78 mg/dL).   Medications: infusions   heparin 1,100 Units/hr (08/05/22 0948)     Brief Assessment, see full assessment earlier today by Len Childs, PharmD 40 yoF with metastatic breast cancer, and new acute PE.  Pharmacy is consulted to dose Heparin IV.   Today, 08/05/2022: Heparin level > 1.1, remains supratherapeutic despite previous hold and rate decrease to heparin 1100 units/hr  - Heparin is infusing via peripheral line, RN reports the heparin level was drawn from port. - RN reports to line issues, bleeding, or complications.   Goal of Therapy:  Heparin level 0.3-0.7 units/ml Monitor platelets by anticoagulation protocol: Yes   Plan:  HOLD heparin x1 hour Resume at reduced rate heparin IV infusion 800 units/hr Heparin level 8 hours after rate change Daily heparin level and CBC    Lynann Beaver PharmD, BCPS [facility] main pharmacy [phone] 08/05/2022 7:00 PM

## 2022-08-05 NOTE — Progress Notes (Addendum)
ANTICOAGULATION CONSULT NOTE   Pharmacy Consult for Heparin Indication: pulmonary embolus  No Known Allergies  Patient Measurements: Height: 5\' 8"  (172.7 cm) Weight: 71.7 kg (158 lb) IBW/kg (Calculated) : 63.9 Heparin Dosing Weight: 71.7 kg (actual weight)  Vital Signs: Temp: 97.7 F (36.5 C) (07/19 0445) Temp Source: Oral (07/19 0445) BP: 114/64 (07/19 0445) Pulse Rate: 71 (07/19 0445)  Labs: Recent Labs    08/03/22 0555 08/04/22 1750 08/05/22 0435  HGB 11.6*  --  12.5  HCT 36.2  --  39.4  PLT 430*  --  539*  APTT  --  29  --   LABPROT  --  15.5*  --   INR  --  1.2  --   CREATININE 0.50  --   --     Estimated Creatinine Clearance: 60.4 mL/min (by C-G formula based on SCr of 0.5 mg/dL).   Baseline Labs: 7/17: Hgb = 11.6, low 7/17: Plt =430, elevated 7/17: Scr = 0.5  Medications:  - No anticoagulation PTA - Heparin 5000 units sq every 8 hours (last dose 7/18 1525) >> will discontinue   Assessment: Patient is a 77 y.o. 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    Presented to the ED 07/31/2022 for evaluation of altered mental status/confusion, poor appetite, and generalized weakness.   07/25/2022 CT Chest:  acute PE with moderate thrombus burden and right ventricular strain 08/05/2022 First heparin level 1.32 after heparin drip started at 1250 units/hr with no bolus is above goal. Heparin is infusing via PAC. Per RN the heparin level was drawn peripherally CBC stable. No bleeding reported per d/w RN.   Goal of Therapy:  Heparin level 0.3-0.7  units/ml Monitor platelets by anticoagulation protocol: Yes   Plan:  Hold heparin infusion x 1 hour then resume at 1100 units/hr  Check anti-Xa level 8 hours after drip resumed - will need to be drawn peripherally while heparin infusing via PAC. D/w RN to change heparin to peripheral IV so PAC can be used for labs Daily CBC & heparin level  Continue to monitor H&H and platelets Monitor for signs/symptoms of bleeding  Herby Abraham, Pharm.D Use secure chat for questions 08/05/2022 7:48 AM

## 2022-08-06 ENCOUNTER — Inpatient Hospital Stay (HOSPITAL_COMMUNITY): Payer: 59

## 2022-08-06 DIAGNOSIS — Z515 Encounter for palliative care: Secondary | ICD-10-CM | POA: Diagnosis not present

## 2022-08-06 DIAGNOSIS — K5900 Constipation, unspecified: Secondary | ICD-10-CM | POA: Diagnosis not present

## 2022-08-06 DIAGNOSIS — G936 Cerebral edema: Secondary | ICD-10-CM | POA: Diagnosis not present

## 2022-08-06 DIAGNOSIS — R319 Hematuria, unspecified: Secondary | ICD-10-CM | POA: Diagnosis not present

## 2022-08-06 DIAGNOSIS — N200 Calculus of kidney: Secondary | ICD-10-CM

## 2022-08-06 DIAGNOSIS — N390 Urinary tract infection, site not specified: Secondary | ICD-10-CM | POA: Diagnosis not present

## 2022-08-06 LAB — BASIC METABOLIC PANEL
Anion gap: 5 (ref 5–15)
BUN: 15 mg/dL (ref 8–23)
CO2: 28 mmol/L (ref 22–32)
Calcium: 9.1 mg/dL (ref 8.9–10.3)
Chloride: 104 mmol/L (ref 98–111)
Creatinine, Ser: 0.58 mg/dL (ref 0.44–1.00)
GFR, Estimated: >60 mL/min (ref >60)
Glucose, Bld: 161 mg/dL — ABNORMAL HIGH (ref 70–99)
Potassium: 4.1 mmol/L (ref 3.5–5.1)
Sodium: 137 mmol/L (ref 135–145)

## 2022-08-06 LAB — CULTURE, BLOOD (ROUTINE X 2)
Culture: NO GROWTH
Culture: NO GROWTH
Special Requests: ADEQUATE

## 2022-08-06 LAB — GLUCOSE, CAPILLARY
Glucose-Capillary: 129 mg/dL — ABNORMAL HIGH (ref 70–99)
Glucose-Capillary: 132 mg/dL — ABNORMAL HIGH (ref 70–99)
Glucose-Capillary: 142 mg/dL — ABNORMAL HIGH (ref 70–99)
Glucose-Capillary: 132 mg/dL — ABNORMAL HIGH (ref 70–99)

## 2022-08-06 LAB — CBC
HCT: 38.6 % (ref 36.0–46.0)
Hemoglobin: 12.3 g/dL (ref 12.0–15.0)
MCH: 31.5 pg (ref 26.0–34.0)
MCHC: 31.9 g/dL (ref 30.0–36.0)
MCV: 98.7 fL (ref 80.0–100.0)
Platelets: 461 10*3/uL — ABNORMAL HIGH (ref 150–400)
RBC: 3.91 MIL/uL (ref 3.87–5.11)
RDW: 14.4 % (ref 11.5–15.5)
WBC: 8.7 10*3/uL (ref 4.0–10.5)
nRBC: 0 % (ref 0.0–0.2)

## 2022-08-06 LAB — MAGNESIUM: Magnesium: 2.2 mg/dL (ref 1.7–2.4)

## 2022-08-06 LAB — HEPARIN LEVEL (UNFRACTIONATED)
Heparin Unfractionated: 0.46 IU/mL (ref 0.30–0.70)
Heparin Unfractionated: 0.55 IU/mL (ref 0.30–0.70)

## 2022-08-06 MED ORDER — OXYCODONE HCL 5 MG PO TABS
5.0000 mg | ORAL_TABLET | ORAL | Status: DC | PRN
  Administered 2022-08-06: 5 mg via ORAL
  Administered 2022-08-06 – 2022-08-08 (×8): 10 mg via ORAL
  Administered 2022-08-08: 5 mg via ORAL
  Administered 2022-08-08 – 2022-08-09 (×4): 10 mg via ORAL
  Filled 2022-08-06 (×9): qty 2
  Filled 2022-08-06: qty 1
  Filled 2022-08-06 (×4): qty 2

## 2022-08-06 MED ORDER — HYDROMORPHONE HCL 1 MG/ML IJ SOLN
0.5000 mg | INTRAMUSCULAR | Status: DC | PRN
  Administered 2022-08-06 – 2022-08-09 (×14): 0.5 mg via INTRAVENOUS
  Filled 2022-08-06 (×16): qty 0.5

## 2022-08-06 MED ORDER — BISACODYL 10 MG RE SUPP
10.0000 mg | Freq: Once | RECTAL | Status: AC
  Administered 2022-08-06: 10 mg via RECTAL
  Filled 2022-08-06: qty 1

## 2022-08-06 MED ORDER — FENTANYL 25 MCG/HR TD PT72
1.0000 | MEDICATED_PATCH | TRANSDERMAL | Status: DC
  Administered 2022-08-06 – 2022-08-09 (×2): 1 via TRANSDERMAL
  Filled 2022-08-06 (×2): qty 1

## 2022-08-06 NOTE — Progress Notes (Signed)
ANTICOAGULATION CONSULT NOTE   Pharmacy Consult for Heparin Indication: pulmonary embolus  No Known Allergies  Patient Measurements: Height: 5\' 8"  (172.7 cm) Weight: 71.7 kg (158 lb) IBW/kg (Calculated) : 63.9 Heparin Dosing Weight: actual weight  Vital Signs: BP: 150/101 (07/20 0840) Pulse Rate: 72 (07/20 0840)  Labs: Recent Labs    08/04/22 1750 08/05/22 0435 08/05/22 0438 08/05/22 1800 08/06/22 0400 08/06/22 0432 08/06/22 1218  HGB  --  12.5  --   --   --  12.3  --   HCT  --  39.4  --   --   --  38.6  --   PLT  --  539*  --   --   --  461*  --   APTT 29  --   --   --   --   --   --   LABPROT 15.5*  --   --   --   --   --   --   INR 1.2  --   --   --   --   --   --   HEPARINUNFRC  --   --    < > >1.10* 0.46  --  0.55  CREATININE  --  0.78  --   --   --  0.58  --    < > = values in this interval not displayed.    Estimated Creatinine Clearance: 60.4 mL/min (by C-G formula based on SCr of 0.58 mg/dL).   Baseline Labs: 7/17: Hgb = 11.6, low 7/17: Plt =430, elevated 7/17: Scr = 0.5  Assessment: Patient is a 77 y.o. 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    Presented to the ED 07/31/2022 for evaluation of altered mental status/confusion, poor appetite, and generalized weakness.   08/04/2022 CT Chest:  acute PE with moderate thrombus burden and right ventricular strain  08/06/2022 Heparin level 0.55 (therapeutic) with heparin infusion at 800 units/hr Hgb 12.3, PLT 461 No complications of therapy noted Heparin is infusing via peripheral line    Goal of Therapy:  Heparin level 0.3-0.7 units/ml Monitor platelets by anticoagulation protocol: Yes   Plan:  -Continue heparin infusion at 800 units/hr -Daily CBC & heparin level  -Continue to monitor H&H and platelets -Monitor for signs/symptoms of bleeding  Pricilla Riffle, PharmD, BCPS Clinical Pharmacist 08/06/2022 1:58 PM

## 2022-08-06 NOTE — Progress Notes (Signed)
Daily Progress Note   Patient Name: Jennifer Cowan       Date: 08/06/2022 DOB: 12-Mar-1945  Age: 77 y.o. MRN#: [id number] Attending Physician: Leatha Gilding, MD Primary Care Physician: [name], FNP Admit Date: 07/31/2022 Length of Stay: 6 days  Reason for Consultation/Follow-up: Establishing goals of care and Pain control  Subjective:   CC: Patient notes overall pain worsening. Following up regarding complex medical decision making and symptom management.   Subjective:  Reviewed EMR prior to presenting to bedside.  At time of EMR review in past 24 hours patient has received oxycodone 5 mg x 4 doses and IV morphine 2 mg x 4 doses.  Patient was started on low dose fentanyl patch 12 mcg/h yesterday.    Presented to bedside to see patient later in afternoon.  Sitter still present at bedside.  Patient easily awakened and invited this provider in to discuss care with her.  Patient moaning at times noting her pain is not controlled.  Patient now describing pain under her left arm.  Patient's pain has not been consistently located aching management difficult.  Patient again described that oxycodone only last for about an hour when she receives the 5 mg dose.  Discussed had started patient on fentanyl patch to make sure would tolerate and so now will increase to 25 mcg/h based on current OME requirements.  Patient agreeing with this plan.  Also noted would change oxycodone to allow for dose range of 5-10 mg every 4 hours as needed.  All questions answered at that time.  Noted palliative medicine team continue to follow along with patient's medical journey.  Discussed care with bedside RN who noted patient has still not had a BM.  Noted would provide with suppository today for bowel movement as last one noted to be 7/15 in EMR.  Review of Systems pain Objective:   Vital Signs:  BP (!) 150/101   Pulse 72   Temp 98 F (36.7 C) (Oral)   Resp 18   Ht 5\' 8"  (1.727 m)   Wt 71.7 kg   SpO2  97%   BMI 24.02 kg/m   Physical Exam: General: awake, laying in bed, moaning at times, interactive Eyes:no drainage noted HENT: moist mucous membranes Cardiovascular: RRR Respiratory: no increased work of breathing noted, not in respiratory distress Skin: no rashes or lesions on visible skin Neuro: appropriately answering questions asked during visit  Imaging:  I personally reviewed recent imaging.   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  Since being admitted patient has been managed with antibiotics for suspected urinary tract infection.  Hospitalization complicated due to delirium.  Patient also being followed by oncology/neuro-oncology.  Palliative medicine team consulted to assist with complex medical decision making. Of note, patient last seen by outpatient palliative medicine team at [facility] on 6/3/4.   Recommendations/Plan: # Complex medical decision making/goals of care:  -Patient's primary focus with this provider continues to be pain management so continuing to address this. Patient  seen being followed by oncology for input regarding cancer directed therapies (noted not much progression on current therapies). Patient now on anticoagulation for new PE.   -  Code Status: Full Code  # Symptom management:  -Pain, in setting of metastatic cancer   - Increase fentanyl to 45mcg/hr q72 hours patch.                                 -Continue Tylenol 1000 mg every 8 hours scheduled during the day.                               -Continue  lidocaine patch to back                               -Change oxycodone to 5-10 mg every 4 hours as needed for breakthrough pain management.                               -Discontinue IV morphine since start on IV dilaudid 0.5mg  q3hrs prn by hospitalist and do want two short acting IV opioid medications    -Constipation   -Continue senna 2 tabs BID   -Continue miralax 17gm BID    -Ordered to give suppository today                 -Delirium, improving Non-pharmacologic Delirium Precautions  - Frequent re-orientation; update board aw/ date, names of treatment team  - Daytime stimulation: Open curtains, turn lights on, and turn on television as tolerated during waking hours  - Nighttime calm - close curtains, turn lights off, and turn off television at 9pm  with minimal interruptions from treatment team during sleeping hours, including avoiding lab draws if possible  - Encourage continued presence of family/friends  - Occupy w/ distractions - e.g. ask them to fold washcloths, busy-board  - Encourage movement with PT/OT as tolerated  - Ensure adequate sensorium, to include providing glasses, dentures, and hearing aids to patient as appropriate  - Ensure adequate nutrition and fluid intake  - Assess and treat pain (incl nonverbal cues) - Assess and treat constipation and urinary retention  - Ensure hydration, electrolyte balance, adequate oxygenation  - Review medications (addition, deletion, changes can trigger)                                -Steroids dose lowered on 7/18 to assist with management.                                -Receiving Seroquel 15.5mg  BID                                              -EKG on 7/10 showed Qtc at 432.   # Psychosocial Support:  - Support System: niece- Phil Dopp, sisterElease Hashimoto    # Discharge Planning:  Skilled Nursing  Facility for rehab with Palliative care service follow-up                -Patient already being  seen by outpatient palliative medicine team at [facility].  Discussed with: patient, RN, pharmacy, hospitalist   Thank you for allowing the palliative care team to participate in the care [name].  Alvester Morin, DO Palliative Care Provider PMT # [phone]  If patient remains symptomatic despite maximum doses, please call PMT at [phone] between 0700 and 1900. Outside of these hours, please call attending, as PMT does not have night coverage.  *Please note that this is a verbal dictation therefore any spelling or grammatical errors are due to the "Dragon Medical One" system interpretation.

## 2022-08-06 NOTE — Progress Notes (Signed)
ANTICOAGULATION CONSULT NOTE   Pharmacy Consult for Heparin Indication: pulmonary embolus  No Known Allergies  Patient Measurements: Height: 5\' 8"  (172.7 cm) Weight: 71.7 kg (158 lb) IBW/kg (Calculated) : 63.9 Heparin Dosing Weight: actual weight  Vital Signs: Temp: 98 F (36.7 C) (07/19 2015) Temp Source: Oral (07/19 2015) BP: 142/81 (07/19 2015) Pulse Rate: 66 (07/19 2015)  Labs: Recent Labs    08/03/22 0555 08/04/22 1750 08/05/22 0435 08/05/22 0438 08/05/22 1800 08/06/22 0400 08/06/22 0432  HGB 11.6*  --  12.5  --   --   --  12.3  HCT 36.2  --  39.4  --   --   --  38.6  PLT 430*  --  539*  --   --   --  461*  APTT  --  29  --   --   --   --   --   LABPROT  --  15.5*  --   --   --   --   --   INR  --  1.2  --   --   --   --   --   HEPARINUNFRC  --   --   --  >1.10* >1.10* 0.46  --   CREATININE 0.50  --  0.78  --   --   --  0.58    Estimated Creatinine Clearance: 60.4 mL/min (by C-G formula based on SCr of 0.58 mg/dL).   Baseline Labs: 7/17: Hgb = 11.6, low 7/17: Plt =430, elevated 7/17: Scr = 0.5  Medications:  - No anticoagulation PTA - Heparin 5000 units sq every 8 hours (last dose 7/18 1525) >> will discontinue   Assessment: Patient is a 77 y.o. 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    Presented to the ED 07/31/2022 for evaluation of altered mental status/confusion, poor appetite, and generalized weakness.   07/25/2022 CT Chest:  acute PE with moderate thrombus burden and right ventricular strain  08/06/2022 Heparin level = 0.46  (therapeutic) with heparin gtt @ 800 units/hr Hgb = 12.3, PLTC 461k No complications of therapy noted Heparin is infusing via peripheral line   Goal of Therapy:  Heparin level 0.3-0.7 units/ml Monitor platelets by anticoagulation protocol: Yes   Plan:  Continue heparin gtt @ 800 units/hr Recheck heparin level in 8 hr to confirm therapeutic dose Daily CBC & heparin level  Continue to monitor H&H and platelets Monitor for signs/symptoms of bleeding  Terrilee Files, PharmD 08/06/2022 5:15 AM

## 2022-08-06 NOTE — Progress Notes (Signed)
Jennifer Cowan WJX:[id number] DOB: 1945-04-18 DOA: 07/31/2022 PCP: [name], FNP   LOS: 6 days   Brief Narrative / Interim history: 77 y.o. 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  In the ED, patient was slightly tachycardic but afebrile.  Not hypotensive.  Labs showing no leukocytosis or anemia, potassium 3.4, creatinine 0.5, ammonia level <10, SARS-CoV-2 PCR negative. UA showing negative nitrite, moderate leukocytes, and microscopy showing 6-10 RBCs, 11-20 WBCs, and no bacteria.  Chest x-ray showing small left pleural effusion. Head CT with no significant change in extensive vasogenic edema throughout the right temporal and parietal lobes, and involving the right thalamus and basal ganglia. Mild right-to-left midline shift measuring 5 mm also shows no significant change.   Subjective / 24h Interval events: To complain of back pain, lower back mostly on the right side  Assesement and Plan: Principal Problem:   UTI (urinary tract infection) Active Problems:   Metastasis to brain [facility])   History of breast cancer   Malignant neoplasm of unspecified part of unspecified bronchus or lung (HCC)   Type 2 diabetes mellitus (HCC)   Renal calculi   Microscopic hematuria   Acute encephalopathy   Weakness   Cerebral edema (HCC)   Pain   High risk medication use   Counseling and coordination of care   Palliative care encounter    Constipation   Principal problem UTI with hematuria , renal calculi - CT on admission with 2.2 cm 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 Case was discussed with Dr Marlou Porch, did not recommend any intervention for now.  Will repeat a renal ultrasound today due to pain in the same area. -Urine cultures have been negative.  Has been maintained on ceftriaxone, switched to oral equivalent to complete a 5-day course, 1 day remaining  Active problems  Acute pulmonary embolism - probably causing her back pain to a certain extent. Discussed with Dr Laurell Josephs and Dr Al Pimple, started on anticoagulation. Closely monitor with her history of brain hemorrhage. Most recent CT scan without evidence of bleeding. Monitor, perhaps switch to NOAC this weekend if stable  Acute encephalopathy, in-hospital delirium, lung cancer with brain metastases, breast cancer - CT head showing stable extensive vasogenic edema and stable mild right to left midline shift measuring 5 mm.  No evidence of intracranial hemorrhage on CT. Continue Keppra, Decadron, 6mg  q6hr IV until discharge.  Per Dr. Barbaraann Cao- Once improved, can dc on 4mg  BID PO steroids and follow up with him outpatient. He will plan to repeat brain MRI Aug 8th. If she doesn't improve within 1-2 day on this treatment, can repeat the brain MRI then while inpatient.  -Attempt to wean off more the Decadron due to in-hospital delirium  Generalized weakness - In the setting of known cancer and UTI.  Also she was previously on steroids which were tapered off a week ago which could be contributing. 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  Patient had moderate pericardial effusion on echo done in  February 2024 which had essentially resolved on repeat echo done in April.  CT showing trace pericardial effusion.   Left pleural effusion  -Patient had tiny left pleural effusion on CT back in February 2024  and no thoracentesis was performed.  CT showing trace left pleural effusion.    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   Hyperlipidemia - Continue Lipitor.   Hypertension - Blood pressure stable.  Continue atenolol.   Diet controlled type 2 diabetes - A1c 5.9 in June 2023. 6.2 this admission and has been on steroids long-term. Continue SSI  Lab Results  Component Value Date   HGBA1C 6.2 (H) 08/01/2022   CBG (last 3)  Recent Labs    08/05/22 1158 08/05/22 1701 08/06/22 0828  GLUCAP 144* 142* 129*   Scheduled Meds:  acetaminophen  1,000 mg Oral Q8H   atenolol  12.5 mg Oral Daily   atorvastatin  80 mg Oral QHS   cefadroxil  500 mg Oral BID   Chlorhexidine Gluconate Cloth  6 each Topical Daily   dexamethasone (DECADRON) injection  6 mg Intravenous Q8H   fentaNYL  1 patch Transdermal Q72H   insulin aspart  0-9 Units Subcutaneous TID WC   levETIRAcetam  500 mg Oral BID   lidocaine  2 patch Transdermal Q24H   polyethylene glycol  17 g Oral BID   QUEtiapine  12.5 mg Oral BID   senna  2 tablet Oral BID   Continuous Infusions:  heparin 800 Units/hr (08/05/22 2019)   PRN Meds:.HYDROmorphone (DILAUDID) injection, morphine injection, naLOXone (NARCAN)  injection, oxyCODONE, sodium chloride flush  Current Outpatient Medications  Medication Instructions   Accu-Chek Softclix Lancets lancets Use to check blood sugar as directed 4 times a day   acetaminophen (TYLENOL) 1,000 mg, Oral, Every 6 hours PRN   amLODipine (NORVASC) 10 MG tablet TAKE 1 TABLET BY MOUTH DAILY   aspirin (EQ ASPIRIN) 325 mg, Oral, Daily   atenolol (TENORMIN) 12.5 mg, Oral, Daily   atorvastatin (LIPITOR) 80 mg, Oral, Daily   benazepril (LOTENSIN) 10 MG tablet TAKE 1 TABLET BY MOUTH DAILY   blood glucose meter kit and supplies KIT Dispense based on patient and insurance preference. Use up to four times daily as directed.   Blood Glucose Monitoring Suppl (ACCU-CHEK GUIDE) w/Device KIT Use as  directed 4 times a day   ferrous sulfate 325 mg, Oral, Daily with breakfast   gabapentin (NEURONTIN) 300 mg, Oral, 2 times daily   glucose blood (ACCU-CHEK GUIDE) test strip Use as directed 4 times a day   ketoconazole (NIZORAL) 2 % cream 1 Application, Topical, See admin instructions, Apply to both feet 2 times a day   levETIRAcetam (KEPPRA) 500 mg, Oral, 2 times daily   lidocaine-prilocaine (EMLA) cream 1 Application, Topical, As needed   meloxicam (MOBIC) 7.5 MG tablet TAKE 1 TABLET BY MOUTH DAILY   ondansetron (ZOFRAN) 8 mg, Oral, Every 8 hours PRN   oxyCODONE (OXY IR/ROXICODONE) 5-10 mg, Oral, Every 4 hours PRN   prochlorperazine (COMPAZINE) 10 mg, Oral, Every 6 hours PRN   simvastatin (ZOCOR) 40 mg, Oral, Daily   spironolactone (ALDACTONE) 12.5 mg, Oral, Daily   traMADol (ULTRAM) 50 mg, Oral, Every 6 hours PRN   Vitamin D3 5,000 Units, Oral, Daily    Diet Orders (From admission, onward)     Start     Ordered   07/31/22 2328  Diet Carb Modified Fluid consistency: Thin; Room service appropriate? Yes  Diet effective now  Question Answer Comment  Diet-HS Snack? Nothing   Calorie Level Medium 1600-2000   Fluid consistency: Thin   Room service appropriate? Yes      07/31/22 2332            DVT prophylaxis:    Lab Results  Component Value Date   PLT 461 (H) 08/06/2022      Code Status: Full Code  Family Communication: no family at bedside, updated niece over the phone  Status is: Inpatient Remains inpatient appropriate because: severity of illness, confusion  Level of care: Telemetry  Consultants:  none  Objective: Vitals:   08/05/22 1004 08/05/22 1330 08/05/22 2015 08/06/22 0840  BP: 127/79 119/81 (!) 142/81 (!) 150/101  Pulse: 85 71 66 72  Resp:  13 18   Temp:  97.8 F (36.6 C) 98 F (36.7 C)   TempSrc:  Axillary Oral   SpO2:  99% 97%   Weight:      Height:        Intake/Output Summary (Last 24 hours) at 08/06/2022 1144 Last data filed at  08/06/2022 0900 Gross per 24 hour  Intake 182.25 ml  Output --  Net 182.25 ml   Wt Readings from Last 3 Encounters:  07/31/22 71.7 kg  07/27/22 69.2 kg  07/18/22 67.8 kg    Examination:  Constitutional: NAD Eyes: lids and conjunctivae normal, no scleral icterus ENMT: mmm Neck: normal, supple Respiratory: clear to auscultation bilaterally, no wheezing, no crackles. Normal respiratory effort.  Cardiovascular: Regular rate and rhythm, no murmurs / rubs / gallops. No LE edema. Abdomen: soft, no distention, no tenderness. Bowel sounds positive.   Data Reviewed: I have independently reviewed following labs and imaging studies   CBC Recent Labs  Lab 07/31/22 1330 08/01/22 0032 08/02/22 0432 08/03/22 0555 08/05/22 0435 08/06/22 0432  WBC 5.4 5.4 6.0 5.3 10.2 8.7  HGB 12.7 12.0 11.7* 11.6* 12.5 12.3  HCT 39.3 37.9 36.7 36.2 39.4 38.6  PLT 394 395 420* 430* 539* 461*  MCV 98.5 99.0 100.0 98.9 98.5 98.7  MCH 31.8 31.3 31.9 31.7 31.3 31.5  MCHC 32.3 31.7 31.9 32.0 31.7 31.9  RDW 14.7 14.7 14.7 14.6 14.4 14.4  LYMPHSABS 0.5*  --   --   --   --   --   MONOABS 0.7  --   --   --   --   --   EOSABS 0.1  --   --   --   --   --   BASOSABS 0.0  --   --   --   --   --     Recent Labs  Lab 07/31/22 1330 07/31/22 1347 08/01/22 0032 08/02/22 0432 08/03/22 0555 08/04/22 1750 08/05/22 0435 08/06/22 0432  NA 138  --  139 141 139  --  136 137  K 3.4*  --  3.7 3.5 3.6  --  3.9 4.1  CL 106  --  108 112* 108  --  105 104  CO2 23  --  19* 23 23  --  25 28  GLUCOSE 122*  --  138* 116* 128*  --  147* 161*  BUN 9  --  9 14 11   --  17 15  CREATININE 0.53  --  0.50 0.62 0.50  --  0.78 0.58  CALCIUM 8.6*  --  8.6* 8.6* 8.7*  --  9.2 9.1  AST 19  --   --   --   --   --   --   --  ALT 23  --   --   --   --   --   --   --   ALKPHOS 68  --   --   --   --   --   --   --   BILITOT 0.8  --   --   --   --   --   --   --   ALBUMIN 3.0*  --   --   --   --   --   --   --   MG  --   --  2.0  --    --   --  2.2 2.2  INR  --   --   --   --   --  1.2  --   --   HGBA1C  --   --  6.2*  --   --   --   --   --   AMMONIA  --  <10  --   --   --   --   --   --     ------------------------------------------------------------------------------------------------------------------ No results for input(s): "CHOL", "HDL", "LDLCALC", "TRIG", "CHOLHDL", "LDLDIRECT" in the last 72 hours.  Lab Results  Component Value Date   HGBA1C 6.2 (H) 08/01/2022   ------------------------------------------------------------------------------------------------------------------ No results for input(s): "TSH", "T4TOTAL", "T3FREE", "THYROIDAB" in the last 72 hours.  Invalid input(s): "FREET3"  Cardiac Enzymes No results for input(s): "CKMB", "TROPONINI", "MYOGLOBIN" in the last 168 hours.  Invalid input(s): "CK" ------------------------------------------------------------------------------------------------------------------ No results found for: "BNP"  CBG: Recent Labs  Lab 08/04/22 1615 08/05/22 0740 08/05/22 1158 08/05/22 1701 08/06/22 0828  GLUCAP 146* 122* 144* 142* 129*    Recent Results (from the past 240 hour(s))  SARS Coronavirus 2 by RT PCR (hospital order, performed in [facility] hospital lab) *cepheid single result test* Anterior Nasal Swab     Status: None   Collection Time: 07/31/22  1:56 PM   Specimen: Anterior Nasal Swab  Result Value Ref Range Status   SARS Coronavirus 2 by RT PCR NEGATIVE NEGATIVE Final    Comment: (NOTE) SARS-CoV-2 target nucleic acids are NOT DETECTED.  The SARS-CoV-2 RNA is generally detectable in upper and lower respiratory specimens during the acute phase of infection. The lowest concentration of SARS-CoV-2 viral copies this assay can detect is 250 copies / mL. A negative result does not preclude SARS-CoV-2 infection and should not be used as the sole basis for treatment or other patient management decisions.  A negative result may occur with improper  specimen collection / handling, submission of specimen other than nasopharyngeal swab, presence of viral mutation(s) within the areas targeted by this assay, and inadequate number of viral copies (<250 copies / mL). A negative result must be combined with clinical observations, patient history, and epidemiological information.  Fact Sheet for Patients:   RoadLapTop.co.za  Fact Sheet for Healthcare Providers: http://kim-miller.com/  This test is not yet approved or  cleared by the [place] FDA and has been authorized for detection and/or diagnosis of SARS-CoV-2 by FDA under an Emergency Use Authorization (EUA).  This EUA will remain in effect (meaning this test can be used) for the duration of the COVID-19 declaration under Section 564(b)(1) of the Act, 21 U.S.C. section 360bbb-3(b)(1), unless the authorization is terminated or revoked sooner.  Performed at [facility], [address] [place], [place] [address]   Urine Culture (for pregnant, neutropenic or urologic patients or patients with an indwelling urinary catheter)  Status: Abnormal   Collection Time: 07/31/22  2:26 PM   Specimen: Urine, Clean Catch  Result Value Ref Range Status   Specimen Description   Final    URINE, CLEAN CATCH Performed at [facility], [address] [place], [place] [address]    Special Requests   Final    NONE Performed at [facility], [address] [place], [place] [address]    Culture MULTIPLE SPECIES PRESENT, SUGGEST RECOLLECTION (A)  Final   Report Status 08/02/2022 FINAL  Final  Culture, blood (Routine X 2) w Reflex to ID Panel     Status: None   Collection Time: 08/01/22 12:26 AM   Specimen: BLOOD LEFT ARM  Result Value Ref Range Status   Specimen Description   Final    BLOOD LEFT ARM BOTTLES DRAWN AEROBIC AND ANAEROBIC Performed at [facility], [address], [place], [place] [address]    Special Requests   Final    Blood Culture adequate volume Performed at [facility], [address], [place], [place] [address]    Culture   Final    NO GROWTH 5 DAYS Performed at [facility], [address], [place], [place] [address]    Report Status 08/06/2022 FINAL  Final  Culture, blood (Routine X 2) w Reflex to ID Panel     Status: None   Collection Time: 08/01/22 12:32 AM   Specimen: BLOOD LEFT HAND  Result Value Ref Range Status   Specimen Description   Final    BLOOD LEFT HAND BOTTLES DRAWN AEROBIC ONLY Performed at [facility], [address], [place], [place] [address]    Special Requests   Final    Blood Culture adequate volume Performed at [facility], [address], [place], [place] [address]    Culture   Final    NO GROWTH 5 DAYS Performed at [facility], [address], [place], [place] [address]    Report Status 08/06/2022 FINAL  Final     Radiology Studies: No results found.   Pamella Pert, MD, PhD [facility]  Between 7 am - 7 pm I am available, please contact me via Amion (for emergencies) or Securechat (non urgent messages)  Between 7 pm - 7 am I am not available, please contact night coverage MD/APP via Amion

## 2022-08-06 NOTE — Care Plan (Signed)
Pt has not had a BM since 7/15. Dr. Patterson Hammersmith made aware. Suppository ordered.

## 2022-08-07 ENCOUNTER — Encounter (HOSPITAL_COMMUNITY): Payer: Self-pay | Admitting: Internal Medicine

## 2022-08-07 ENCOUNTER — Inpatient Hospital Stay (HOSPITAL_COMMUNITY): Payer: 59

## 2022-08-07 DIAGNOSIS — R531 Weakness: Secondary | ICD-10-CM | POA: Diagnosis not present

## 2022-08-07 DIAGNOSIS — G9340 Encephalopathy, unspecified: Secondary | ICD-10-CM | POA: Diagnosis not present

## 2022-08-07 DIAGNOSIS — N390 Urinary tract infection, site not specified: Secondary | ICD-10-CM | POA: Diagnosis not present

## 2022-08-07 DIAGNOSIS — R319 Hematuria, unspecified: Secondary | ICD-10-CM | POA: Diagnosis not present

## 2022-08-07 DIAGNOSIS — G936 Cerebral edema: Secondary | ICD-10-CM | POA: Diagnosis not present

## 2022-08-07 DIAGNOSIS — Z515 Encounter for palliative care: Secondary | ICD-10-CM | POA: Diagnosis not present

## 2022-08-07 LAB — COMPREHENSIVE METABOLIC PANEL
ALT: 68 U/L — ABNORMAL HIGH (ref 0–44)
AST: 33 U/L (ref 15–41)
Albumin: 3.1 g/dL — ABNORMAL LOW (ref 3.5–5.0)
Alkaline Phosphatase: 56 U/L (ref 38–126)
Anion gap: 8 (ref 5–15)
BUN: 17 mg/dL (ref 8–23)
CO2: 27 mmol/L (ref 22–32)
Calcium: 9.4 mg/dL (ref 8.9–10.3)
Chloride: 103 mmol/L (ref 98–111)
Creatinine, Ser: 0.61 mg/dL (ref 0.44–1.00)
GFR, Estimated: >60 mL/min (ref >60)
Glucose, Bld: 156 mg/dL — ABNORMAL HIGH (ref 70–99)
Potassium: 3.8 mmol/L (ref 3.5–5.1)
Sodium: 138 mmol/L (ref 135–145)
Total Bilirubin: 0.5 mg/dL (ref 0.3–1.2)
Total Protein: 5.9 g/dL — ABNORMAL LOW (ref 6.5–8.1)

## 2022-08-07 LAB — GLUCOSE, CAPILLARY
Glucose-Capillary: 147 mg/dL — ABNORMAL HIGH (ref 70–99)
Glucose-Capillary: 143 mg/dL — ABNORMAL HIGH (ref 70–99)
Glucose-Capillary: 111 mg/dL — ABNORMAL HIGH (ref 70–99)
Glucose-Capillary: 130 mg/dL — ABNORMAL HIGH (ref 70–99)

## 2022-08-07 LAB — CBC
HCT: 42 % (ref 36.0–46.0)
Hemoglobin: 13.4 g/dL (ref 12.0–15.0)
MCH: 31.3 pg (ref 26.0–34.0)
MCHC: 31.9 g/dL (ref 30.0–36.0)
MCV: 98.1 fL (ref 80.0–100.0)
Platelets: 524 10*3/uL — ABNORMAL HIGH (ref 150–400)
RBC: 4.28 MIL/uL (ref 3.87–5.11)
RDW: 14.2 % (ref 11.5–15.5)
WBC: 9.3 10*3/uL (ref 4.0–10.5)
nRBC: 0.2 % (ref 0.0–0.2)

## 2022-08-07 LAB — MAGNESIUM: Magnesium: 2.1 mg/dL (ref 1.7–2.4)

## 2022-08-07 LAB — HEPARIN LEVEL (UNFRACTIONATED): Heparin Unfractionated: 0.56 IU/mL (ref 0.30–0.70)

## 2022-08-07 MED ORDER — DEXAMETHASONE 4 MG PO TABS
4.0000 mg | ORAL_TABLET | Freq: Every day | ORAL | Status: DC
  Administered 2022-08-07 – 2022-08-08 (×2): 4 mg via ORAL
  Filled 2022-08-07 (×2): qty 1

## 2022-08-07 MED ORDER — PANTOPRAZOLE SODIUM 40 MG PO TBEC
40.0000 mg | DELAYED_RELEASE_TABLET | Freq: Every day | ORAL | Status: DC
  Administered 2022-08-07 – 2022-08-09 (×3): 40 mg via ORAL
  Filled 2022-08-07 (×3): qty 1

## 2022-08-07 MED ORDER — APIXABAN 5 MG PO TABS: 5.0000 mg | ORAL_TABLET | Freq: Two times a day (BID) | ORAL | Status: DC

## 2022-08-07 MED ORDER — DEXAMETHASONE 4 MG PO TABS: 6.0000 mg | ORAL_TABLET | Freq: Every day | ORAL | Status: DC

## 2022-08-07 MED ORDER — DEXAMETHASONE 4 MG PO TABS
4.0000 mg | ORAL_TABLET | Freq: Every day | ORAL | Status: DC
  Administered 2022-08-08 – 2022-08-09 (×2): 4 mg via ORAL
  Filled 2022-08-07 (×2): qty 1

## 2022-08-07 MED ORDER — APIXABAN 5 MG PO TABS
10.0000 mg | ORAL_TABLET | Freq: Two times a day (BID) | ORAL | Status: DC
  Administered 2022-08-07 – 2022-08-09 (×5): 10 mg via ORAL
  Filled 2022-08-07 (×5): qty 2

## 2022-08-07 NOTE — Discharge Instructions (Addendum)
Information on my medicine - ELIQUIS (apixaban)  Why was Eliquis prescribed for you? Eliquis was prescribed to treat blood clots that may have been found in the veins of your legs (deep vein thrombosis) or in your lungs (pulmonary embolism) and to reduce the risk of them occurring again.  What do You need to know about Eliquis ? The starting dose is 10 mg (two 5 mg tablets) taken TWICE daily for the FIRST SEVEN (7) DAYS, then on 08/14/2022  the dose is reduced to ONE 5 mg tablet taken TWICE daily.  Eliquis may be taken with or without food.   Try to take the dose about the same time in the morning and in the evening. If you have difficulty swallowing the tablet whole please discuss with your pharmacist how to take the medication safely.  Take Eliquis exactly as prescribed and DO NOT stop taking Eliquis without talking to the doctor who prescribed the medication.  Stopping may increase your risk of developing a new blood clot.  Refill your prescription before you run out.  After discharge, you should have regular check-up appointments with your healthcare provider that is prescribing your Eliquis.    What do you do if you miss a dose? If a dose of ELIQUIS is not taken at the scheduled time, take it as soon as possible on the same day and twice-daily administration should be resumed. The dose should not be doubled to make up for a missed dose.  Important Safety Information A possible side effect of Eliquis is bleeding. You should call your healthcare provider right away if you experience any of the following: Bleeding from an injury or your nose that does not stop. Unusual colored urine (red or dark brown) or unusual colored stools (red or black). Unusual bruising for unknown reasons. A serious fall or if you hit your head (even if there is no bleeding).  Some medicines may interact with Eliquis and might increase your risk of bleeding or clotting while on Eliquis. To help avoid  this, consult your healthcare provider or pharmacist prior to using any new prescription or non-prescription medications, including herbals, vitamins, non-steroidal anti-inflammatory drugs (NSAIDs) and supplements.  This website has more information on Eliquis (apixaban): http://www.eliquis.com/eliquis/home

## 2022-08-07 NOTE — Plan of Care (Signed)
  Problem: Fluid Volume: Goal: Ability to maintain a balanced intake and output will improve Outcome: Progressing   Problem: Metabolic: Goal: Ability to maintain appropriate glucose levels will improve Outcome: Progressing   Problem: Nutritional: Goal: Maintenance of adequate nutrition will improve Outcome: Progressing Goal: Progress toward achieving an optimal weight will improve Outcome: Progressing   Problem: Skin Integrity: Goal: Risk for impaired skin integrity will decrease Outcome: Progressing   Problem: Tissue Perfusion: Goal: Adequacy of tissue perfusion will improve Outcome: Progressing   Problem: Clinical Measurements: Goal: Ability to maintain clinical measurements within normal limits will improve Outcome: Progressing Goal: Will remain free from infection Outcome: Progressing Goal: Diagnostic test results will improve Outcome: Progressing Goal: Respiratory complications will improve Outcome: Progressing   Problem: Activity: Goal: Risk for activity intolerance will decrease Outcome: Progressing   Problem: Nutrition: Goal: Adequate nutrition will be maintained Outcome: Progressing   Problem: Elimination: Goal: Will not experience complications related to bowel motility Outcome: Progressing   Problem: Pain Managment: Goal: General experience of comfort will improve Outcome: Progressing   Problem: Safety: Goal: Ability to remain free from injury will improve Outcome: Progressing   Problem: Skin Integrity: Goal: Risk for impaired skin integrity will decrease Outcome: Progressing

## 2022-08-07 NOTE — Progress Notes (Signed)
Patient is alert to self and place only. Unable to provide accurate information to complete the rest of her admission history. Will pass on to day RN if family visits.Priscille Kluver

## 2022-08-07 NOTE — Plan of Care (Signed)
  Problem: Education: Goal: Individualized Educational Video(s) Outcome: Progressing   Problem: Fluid Volume: Goal: Ability to maintain a balanced intake and output will improve Outcome: Progressing   Problem: Metabolic: Goal: Ability to maintain appropriate glucose levels will improve Outcome: Progressing   Problem: Nutritional: Goal: Maintenance of adequate nutrition will improve Outcome: Progressing Goal: Progress toward achieving an optimal weight will improve Outcome: Progressing   Problem: Skin Integrity: Goal: Risk for impaired skin integrity will decrease Outcome: Progressing   Problem: Tissue Perfusion: Goal: Adequacy of tissue perfusion will improve Outcome: Progressing   Problem: Clinical Measurements: Goal: Ability to maintain clinical measurements within normal limits will improve Outcome: Progressing Goal: Will remain free from infection Outcome: Progressing Goal: Diagnostic test results will improve Outcome: Progressing Goal: Respiratory complications will improve Outcome: Progressing Goal: Cardiovascular complication will be avoided Outcome: Progressing   Problem: Activity: Goal: Risk for activity intolerance will decrease Outcome: Progressing   Problem: Nutrition: Goal: Adequate nutrition will be maintained Outcome: Progressing   Problem: Elimination: Goal: Will not experience complications related to bowel motility Outcome: Progressing Goal: Will not experience complications related to urinary retention Outcome: Progressing   Problem: Pain Managment: Goal: General experience of comfort will improve Outcome: Progressing   Problem: Safety: Goal: Ability to remain free from injury will improve Outcome: Progressing   Problem: Skin Integrity: Goal: Risk for impaired skin integrity will decrease Outcome: Progressing

## 2022-08-07 NOTE — Progress Notes (Signed)
Daily Progress Note   Patient Name: Jennifer Cowan       Date: 08/07/2022 DOB: 01/07/1946  Age: 77 y.o. MRN#: [id number] Attending Physician: Leatha Gilding, MD Primary Care Physician: [name], FNP Admit Date: 07/31/2022 Length of Stay: 7 days  Reason for Consultation/Follow-up: Establishing goals of care and Pain control  Subjective:   CC: Patient noted pain improved at times and then acutely worsens.  Following up regarding complex medical decision making and symptom management.   Subjective:  Reviewed EMR prior to presenting to bedside.  At time of EMR review in past 24 hours patient has received oxycodone 5 mg x 1 doses, oxycodone 10 mg x3 doses, IV dilaudid 0.5mg  x 5 doses. Fentanyl patch increased to 4mcg/hr yesterday.   Discussed care with hospitalist prior to seeing patient.  Hospitalist noted patient reported pain resolved in the morning when had seen her. Presented to bedside later in afternoon.  Updated by RN that patient now having worsening pain.  Presented to bedside to meet with patient.  Patient noting pain in her back at this time though unable to pinpoint.  Patient's pain has continued to moved to multiple locations in body including arm, leg, and back.  Patient's report regarding pain do not remain consistent.  Bedside sitter did note the patient had BM yesterday after suppository received.  Discussed care with hospitalist.  Difficulties in controlling pain when patient is a poor historian unable to pinpoint pain.  Patient has received multiple imaging workups not showing any specific reasons for pain.  Patient does have non obstructing kidney stones.  Unsure if PE contributing to patient's pain.  Discussed did not recommend use of muscle relaxants for patient's pain as may worsen mentation.  Discussed patient does not seem to be responding to opioids overall so unsure if increase in opioids will alleviate patient's specific pain type.  Review of  Systems pain Objective:   Vital Signs:  BP 123/78 (BP Location: Left Arm)   Pulse 78   Temp 97.6 F (36.4 C) (Oral)   Resp 13   Ht 5\' 8"  (1.727 m)   Wt 71.7 kg   SpO2 95%   BMI 24.02 kg/m   Physical Exam: General: awake, laying in bed, moaning at times, interactive Eyes:no drainage noted HENT: moist mucous membranes Cardiovascular: RRR Respiratory: no increased work of breathing noted, not in respiratory distress Skin: no rashes or lesions on visible skin Neuro: appropriately answering questions asked during visit  Imaging:  I personally reviewed recent imaging.   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  Since being admitted patient has been managed with antibiotics for suspected urinary tract infection.  Hospitalization complicated due to delirium.  Patient also being followed by oncology/neuro-oncology.  Palliative medicine team consulted to assist with complex medical decision making. Of note, patient last seen by outpatient palliative medicine team at [facility] on 6/3/4.   Recommendations/Plan: # Complex medical decision making/goals of care:  -Patient's primary focus with this provider continues  to be pain management so continuing to address this. Patient seen being followed by oncology for input regarding cancer directed therapies (noted not much progression on current therapies). Patient now  on anticoagulation for new PE.   -  Code Status: Full Code  # Symptom management:  -Pain, in setting of metastatic cancer   - Continue fentanyl to 39mcg/hr q72 hours patch.                                -Continue Tylenol 1000 mg every 8 hours scheduled during the day.                               -Continue  lidocaine patch to back                               -Continue oxycodone to 5-10 mg every 4 hours as needed for breakthrough pain management.                               -Consider addition of neuropathic agent such as gabapentin 100mg  at bedtime to determine if will assist with pain management. -Discussed with hospitalist encouragement to get patient out of bed as if from arthritis, could be worsened by lack or movement and laying in bed all day -Do no recommend use of muscle relaxants as will likely on worsen mentioned, not improve management of pain -Previously has patient on scheduled ibuprofen though discontinued once start on heparin for PE    -Constipation BM noted on 7/20 after suppository given.    -Continue senna 2 tabs BID   -Continue miralax 17gm BID                  -Delirium, improving Non-pharmacologic Delirium Precautions  - Frequent re-orientation; update board aw/ date, names of treatment team  - Daytime stimulation: Open curtains, turn lights on, and turn on television as tolerated during waking hours  - Nighttime calm - close curtains, turn lights off, and turn off television at 9pm  with minimal interruptions from treatment team during sleeping hours, including avoiding lab draws if possible  - Encourage continued presence of family/friends  - Occupy w/ distractions - e.g. ask them to fold washcloths, busy-board  - Encourage movement with PT/OT as tolerated  - Ensure adequate sensorium, to include providing glasses, dentures, and hearing aids to patient as appropriate  - Ensure adequate nutrition and fluid intake  - Assess and treat pain (incl nonverbal  cues) - Assess and treat constipation and urinary retention  - Ensure hydration, electrolyte balance, adequate oxygenation  - Review medications (addition, deletion, changes can trigger)                                -Steroids dose lowered again 7/21 to assist with management of delirium                               -Receiving Seroquel 15.5mg  BID                                              -  EKG on 7/10 showed Qtc at 432.   # Psychosocial Support:  - Support System: niece- Phil Dopp, sisterElease Hashimoto    # Discharge Planning:  Skilled Nursing Facility for rehab with Palliative care service follow-up                -Patient already being seen by outpatient palliative medicine team at [facility].  Discussed with: patient, RN, pharmacy, hospitalist   Thank you for allowing the palliative care team to participate in the care Jennifer Cowan.  Jennifer Morin, DO Palliative Care Provider PMT # [phone]  If patient remains symptomatic despite maximum doses, please call PMT at [phone] between 0700 and 1900. Outside of these hours, please call attending, as PMT does not have night coverage.  This provider spent a total of 35 minutes providing patient's care.  Includes review of EMR, discussing care with other staff members involved in patient's medical care, obtaining relevant history and information from patient and/or patient's family, and personal review of imaging and lab work. Greater than 50% of the time was spent counseling and coordinating care related to the above assessment and plan.   *Please note that this is a verbal dictation therefore any spelling or grammatical errors are due to the "[employer]" system interpretation.

## 2022-08-07 NOTE — Progress Notes (Signed)
PROGRESS NOTE  Jennifer Cowan ZOX:[id number] DOB: 1945/04/28 DOA: 07/31/2022 PCP: [name], FNP   LOS: 7 days   Brief Narrative / Interim history: 77 y.o. 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  In the ED, patient was slightly tachycardic but afebrile.  Not hypotensive.  Labs showing no leukocytosis or anemia, potassium 3.4, creatinine 0.5, ammonia level <10, SARS-CoV-2 PCR negative. UA showing negative nitrite, moderate leukocytes, and microscopy showing 6-10 RBCs, 11-20 WBCs, and no bacteria.  Chest x-ray showing small left pleural effusion. Head CT with no significant change in extensive vasogenic edema throughout the right temporal and parietal lobes, and involving the right thalamus and basal ganglia. Mild right-to-left midline shift measuring 5 mm also shows no significant change.   Subjective / 24h Interval events: She is not complaining of pain anymore  Assesement and Plan: Principal Problem:   UTI (urinary tract infection) Active Problems:   Metastasis to brain [facility])   History of breast cancer   Malignant neoplasm of unspecified part of unspecified bronchus or lung (HCC)   Type 2 diabetes mellitus (HCC)   Renal calculi   Microscopic hematuria   Acute encephalopathy   Weakness   Cerebral edema (HCC)   Pain   High risk medication use   Counseling and coordination of care   Palliative care encounter   Constipation   Principal problem UTI  with hematuria , renal calculi - CT on admission with 2.2 cm 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 Case was discussed with Dr Marlou Porch, did not recommend any intervention for now.   -Due to persistent pain underwent repeat imaging 7/20 which did not show any hydronephrosis, reiterated presence of bilateral nonobstructive renal calculi -Urine cultures have been negative.  Has been maintained on ceftriaxone, switched to oral equivalent and has completed a 5-day course  Active problems  Acute pulmonary embolism - probably causing her back pain to a certain extent. Discussed with Dr Laurell Josephs and Dr Al Pimple, started on anticoagulation. Closely monitor with her history of brain hemorrhage. Most recent CT scan without evidence of bleeding. -Hemoglobin stable, clearly no bleeding, start Eliquis today  Acute encephalopathy, in-hospital delirium, lung cancer with brain metastases, breast cancer - CT head showing stable extensive vasogenic edema and stable mild right to left midline shift measuring 5 mm.  No evidence of intracranial hemorrhage on CT. Continue Keppra, Decadron, 6mg  q6hr IV until discharge.  Per Dr. Barbaraann Cao- Once improved, can dc on 4mg  BID PO steroids and follow up with him outpatient. He will plan to repeat brain MRI Aug 8th. If she doesn't improve within 1-2 day on this treatment, can repeat the brain MRI then while inpatient.  -Wean off Decadron to 4 mg BID today in anticipation for discharge in the next few days -More confused this morning, repeat CT scan with similar findings as before, no bleeding  Generalized weakness - In the setting of known cancer and UTI.  Also she was previously on steroids which were tapered off a week ago which could be contributing. 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  Patient had moderate pericardial effusion on echo done in February 2024 which had essentially resolved on repeat  echo done in April.  CT showing trace pericardial effusion.   Left pleural effusion  -Patient had tiny left pleural effusion on CT back in February 2024 and no thoracentesis was performed.  CT showing trace left pleural effusion.    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   Hyperlipidemia - Continue Lipitor.   Hypertension - Blood pressure stable.  Continue atenolol.   Diet controlled type 2 diabetes - A1c 5.9 in June 2023. 6.2 this admission and has been on steroids long-term. Continue SSI  Lab Results  Component Value Date   HGBA1C 6.2 (H) 08/01/2022   CBG (last 3)  Recent Labs    08/06/22 2044 08/07/22 0732 08/07/22 1116  GLUCAP 132* 147* 143*   Scheduled Meds:  acetaminophen  1,000 mg Oral Q8H   atenolol  12.5 mg Oral Daily   atorvastatin  80 mg Oral QHS   Chlorhexidine Gluconate Cloth  6 each Topical Daily   dexamethasone (DECADRON) injection  6 mg Intravenous Q8H   fentaNYL  1 patch Transdermal Q72H   insulin aspart  0-9 Units Subcutaneous TID WC   levETIRAcetam  500 mg Oral BID   lidocaine  2 patch Transdermal Q24H   polyethylene glycol  17 g Oral BID   QUEtiapine  12.5 mg Oral BID   senna  2 tablet Oral BID   Continuous Infusions:  heparin 800 Units/hr (08/06/22 2353)   PRN Meds:.HYDROmorphone (DILAUDID) injection, naLOXone (NARCAN)  injection, oxyCODONE, sodium chloride flush  Current Outpatient Medications  Medication Instructions   Accu-Chek Softclix Lancets lancets Use to check blood sugar as directed 4 times a day   acetaminophen (TYLENOL) 1,000 mg, Oral, Every 6 hours PRN   amLODipine (NORVASC) 10 MG tablet TAKE 1 TABLET BY MOUTH DAILY   aspirin (EQ ASPIRIN) 325 mg, Oral, Daily   atenolol (TENORMIN) 12.5 mg, Oral, Daily   atorvastatin (LIPITOR) 80 mg, Oral, Daily   benazepril (LOTENSIN) 10 MG tablet TAKE 1 TABLET BY MOUTH DAILY   blood glucose meter kit and supplies KIT Dispense based on patient and insurance preference. Use up to four  times daily as directed.   Blood Glucose Monitoring Suppl (ACCU-CHEK GUIDE) w/Device KIT Use as directed 4 times a day   ferrous sulfate 325 mg, Oral, Daily with breakfast   gabapentin (NEURONTIN) 300 mg, Oral, 2 times daily   glucose blood (ACCU-CHEK GUIDE) test strip Use as directed 4 times a day   ketoconazole (NIZORAL) 2 % cream 1 Application, Topical, See admin instructions, Apply to both feet 2 times a day   levETIRAcetam (KEPPRA) 500 mg, Oral, 2 times daily   lidocaine-prilocaine (EMLA) cream 1 Application, Topical, As needed   meloxicam (MOBIC) 7.5 MG tablet TAKE 1 TABLET BY MOUTH DAILY   ondansetron (ZOFRAN) 8 mg, Oral, Every 8 hours PRN   oxyCODONE (OXY IR/ROXICODONE) 5-10 mg, Oral, Every 4 hours PRN   prochlorperazine (COMPAZINE) 10 mg, Oral, Every 6 hours PRN   simvastatin (ZOCOR) 40 mg, Oral, Daily   spironolactone (ALDACTONE) 12.5 mg, Oral, Daily   traMADol (ULTRAM) 50 mg, Oral, Every 6 hours PRN   Vitamin D3 5,000 Units, Oral, Daily    Diet Orders (From admission, onward)     Start     Ordered   07/31/22 2328  Diet Carb Modified  Fluid consistency: Thin; Room service appropriate? Yes  Diet effective now       Question Answer Comment  Diet-HS Snack? Nothing   Calorie Level Medium 1600-2000   Fluid consistency: Thin   Room service appropriate? Yes      07/31/22 2332            DVT prophylaxis:    Lab Results  Component Value Date   PLT 524 (H) 08/07/2022      Code Status: Full Code  Family Communication: no family at bedside, updated niece over the phone  Status is: Inpatient Remains inpatient appropriate because: severity of illness, confusion  Level of care: Telemetry  Consultants:  none  Objective: Vitals:   08/06/22 1414 08/06/22 2048 08/07/22 0517 08/07/22 1114  BP: (!) 155/85 (!) 147/82 123/78 135/84  Pulse: 69 64 78 76  Resp: 18 14 13    Temp: 98.3 F (36.8 C) (!) 97.3 F (36.3 C) 97.6 F (36.4 C)   TempSrc: Oral Oral Oral   SpO2:  95% 96% 95%   Weight:      Height:        Intake/Output Summary (Last 24 hours) at 08/07/2022 1220 Last data filed at 08/07/2022 0749 Gross per 24 hour  Intake 540.2 ml  Output --  Net 540.2 ml   Wt Readings from Last 3 Encounters:  07/31/22 71.7 kg  07/27/22 69.2 kg  07/18/22 67.8 kg    Examination:  Constitutional: NAD Eyes: lids and conjunctivae normal, no scleral icterus ENMT: mmm Neck: normal, supple Respiratory: clear to auscultation bilaterally, no wheezing, no crackles. Normal respiratory effort.  Cardiovascular: Regular rate and rhythm, no murmurs / rubs / gallops. No LE edema. Abdomen: soft, no distention, no tenderness. Bowel sounds positive.   Data Reviewed: I have independently reviewed following labs and imaging studies   CBC Recent Labs  Lab 07/31/22 1330 08/01/22 0032 08/02/22 0432 08/03/22 0555 08/05/22 0435 08/06/22 0432 08/07/22 0515  WBC 5.4   < > 6.0 5.3 10.2 8.7 9.3  HGB 12.7   < > 11.7* 11.6* 12.5 12.3 13.4  HCT 39.3   < > 36.7 36.2 39.4 38.6 42.0  PLT 394   < > 420* 430* 539* 461* 524*  MCV 98.5   < > 100.0 98.9 98.5 98.7 98.1  MCH 31.8   < > 31.9 31.7 31.3 31.5 31.3  MCHC 32.3   < > 31.9 32.0 31.7 31.9 31.9  RDW 14.7   < > 14.7 14.6 14.4 14.4 14.2  LYMPHSABS 0.5*  --   --   --   --   --   --   MONOABS 0.7  --   --   --   --   --   --   EOSABS 0.1  --   --   --   --   --   --   BASOSABS 0.0  --   --   --   --   --   --    < > = values in this interval not displayed.    Recent Labs  Lab 07/31/22 1330 07/31/22 1347 08/01/22 0032 08/02/22 0432 08/03/22 0555 08/04/22 1750 08/05/22 0435 08/06/22 0432 08/07/22 0515  NA 138  --  139 141 139  --  136 137 138  K 3.4*  --  3.7 3.5 3.6  --  3.9 4.1 3.8  CL 106  --  108 112* 108  --  105 104 103  CO2 23  --  19* 23 23  --  25 28 27   GLUCOSE 122*  --  138* 116* 128*  --  147* 161* 156*  BUN 9  --  9 14 11   --  17 15 17   CREATININE 0.53  --  0.50 0.62 0.50  --  0.78 0.58 0.61  CALCIUM  8.6*  --  8.6* 8.6* 8.7*  --  9.2 9.1 9.4  AST 19  --   --   --   --   --   --   --  33  ALT 23  --   --   --   --   --   --   --  68*  ALKPHOS 68  --   --   --   --   --   --   --  56  BILITOT 0.8  --   --   --   --   --   --   --  0.5  ALBUMIN 3.0*  --   --   --   --   --   --   --  3.1*  MG  --   --  2.0  --   --   --  2.2 2.2 2.1  INR  --   --   --   --   --  1.2  --   --   --   HGBA1C  --   --  6.2*  --   --   --   --   --   --   AMMONIA  --  <10  --   --   --   --   --   --   --     ------------------------------------------------------------------------------------------------------------------ No results for input(s): "CHOL", "HDL", "LDLCALC", "TRIG", "CHOLHDL", "LDLDIRECT" in the last 72 hours.  Lab Results  Component Value Date   HGBA1C 6.2 (H) 08/01/2022   ------------------------------------------------------------------------------------------------------------------ No results for input(s): "TSH", "T4TOTAL", "T3FREE", "THYROIDAB" in the last 72 hours.  Invalid input(s): "FREET3"  Cardiac Enzymes No results for input(s): "CKMB", "TROPONINI", "MYOGLOBIN" in the last 168 hours.  Invalid input(s): "CK" ------------------------------------------------------------------------------------------------------------------ No results found for: "BNP"  CBG: Recent Labs  Lab 08/06/22 1215 08/06/22 1624 08/06/22 2044 08/07/22 0732 08/07/22 1116  GLUCAP 132* 142* 132* 147* 143*    Recent Results (from the past 240 hour(s))  SARS Coronavirus 2 by RT PCR (hospital order, performed in [facility] hospital lab) *cepheid single result test* Anterior Nasal Swab     Status: None   Collection Time: 07/31/22  1:56 PM   Specimen: Anterior Nasal Swab  Result Value Ref Range Status   SARS Coronavirus 2 by RT PCR NEGATIVE NEGATIVE Final    Comment: (NOTE) SARS-CoV-2 target nucleic acids are NOT DETECTED.  The SARS-CoV-2 RNA is generally detectable in upper and lower respiratory  specimens during the acute phase of infection. The lowest concentration of SARS-CoV-2 viral copies this assay can detect is 250 copies / mL. A negative result does not preclude SARS-CoV-2 infection and should not be used as the sole basis for treatment or other patient management decisions.  A negative result may occur with improper specimen collection / handling, submission of specimen other than nasopharyngeal swab, presence of viral mutation(s) within the areas targeted by this assay, and inadequate number of viral copies (<250 copies / mL). A negative result must be combined with clinical observations, patient history, and epidemiological information.  Fact Sheet for Patients:   RoadLapTop.co.za  Fact Sheet for Healthcare Providers: http://kim-miller.com/  This test is not yet approved or  cleared by the [place] FDA and has been authorized for detection and/or diagnosis of SARS-CoV-2 by FDA under an Emergency Use Authorization (EUA).  This EUA will remain in effect (meaning this test can be used) for the duration of the COVID-19 declaration under Section 564(b)(1) of the Act, 21 U.S.C. section 360bbb-3(b)(1), unless the authorization is terminated or revoked sooner.  Performed at [facility], [address], [place], [place] [address]   Urine Culture (for pregnant, neutropenic or urologic patients or patients with an indwelling urinary catheter)     Status: Abnormal   Collection Time: 07/31/22  2:26 PM   Specimen: Urine, Clean Catch  Result Value Ref Range Status   Specimen Description   Final    URINE, CLEAN CATCH Performed at [facility], [address] [place], [place] [address]    Special Requests   Final    NONE Performed at [facility], [address], [place], [place] [address]    Culture MULTIPLE SPECIES PRESENT, SUGGEST RECOLLECTION (A)  Final   Report  Status 08/02/2022 FINAL  Final  Culture, blood (Routine X 2) w Reflex to ID Panel     Status: None   Collection Time: 08/01/22 12:26 AM   Specimen: BLOOD LEFT ARM  Result Value Ref Range Status   Specimen Description   Final    BLOOD LEFT ARM BOTTLES DRAWN AEROBIC AND ANAEROBIC Performed at [facility], [address], [place], [place] [address]    Special Requests   Final    Blood Culture adequate volume Performed at [facility], [address], [place], [place] [address]    Culture   Final    NO GROWTH 5 DAYS Performed at [facility], [address] [place], [place] [address]    Report Status 08/06/2022 FINAL  Final  Culture, blood (Routine X 2) w Reflex to ID Panel     Status: None   Collection Time: 08/01/22 12:32 AM   Specimen: BLOOD LEFT HAND  Result Value Ref Range Status   Specimen Description   Final    BLOOD LEFT HAND BOTTLES DRAWN AEROBIC ONLY Performed at [facility], [address] [place], [place] [address]    Special Requests   Final    Blood Culture adequate volume Performed at [facility], [address] [place], [place] [address]    Culture   Final    NO GROWTH 5 DAYS Performed at [facility], [address] [place], [place] [address]    Report Status 08/06/2022 FINAL  Final     Radiology Studies: CT HEAD WO CONTRAST ( )  Result Date: 08/07/2022 CLINICAL DATA:  Mental status change with unknown cause. Metastatic lung cancer EXAM: CT HEAD WITHOUT CONTRAST TECHNIQUE: Contiguous axial images were obtained from the base of the skull through the vertex without intravenous contrast. RADIATION DOSE REDUCTION: This exam was performed according to the departmental dose-optimization program which includes automated exposure control, adjustment of the mA and/or kV according to patient size and/or use of iterative reconstruction technique. COMPARISON:  07/31/2022 FINDINGS:  Brain: Edema in the right cerebral hemisphere centered around a posterior temporal mass is improved with essentially resolved midline shift. Edematous appearance to a lesser degree in the superior left frontal lobe is unchanged and without worrisome mass effect. No acute hemorrhage, acute infarct,  hydrocephalus, or collection. Vascular: No hyperdense vessel or unexpected calcification. Skull: Normal. Negative for fracture or focal lesion. Sinuses/Orbits: No acute finding. IMPRESSION: Brain metastases with cerebral edema most extensive in the right temporoparietal lobes. Edema and mass effect is improved from 07/31/2022. No new abnormality. Electronically Signed   By: Tiburcio Pea M.D.   On: 08/07/2022 09:47   CT ABDOMEN PELVIS WO CONTRAST  Result Date: 08/06/2022 CLINICAL DATA:  Concern for retroperitoneal bleed. EXAM: CT ABDOMEN AND PELVIS WITHOUT CONTRAST TECHNIQUE: Multidetector CT imaging of the abdomen and pelvis was performed following the standard protocol without IV contrast. RADIATION DOSE REDUCTION: This exam was performed according to the departmental dose-optimization program which includes automated exposure control, adjustment of the mA and/or kV according to patient size and/or use of iterative reconstruction technique. COMPARISON:  CT abdomen pelvis dated 07/31/2022. FINDINGS: Evaluation of this exam is limited due to respiratory motion as well as in the absence of intravenous contrast. Lower chest: Minimal left lung base atelectasis. The visualized lung bases are otherwise clear. There is 3 vessel coronary vascular calcification. No intra-abdominal free air or free fluid. Hepatobiliary: Several liver cysts. No biliary dilatation. The gallbladder is unremarkable. Pancreas: Unremarkable. No pancreatic ductal dilatation or surrounding inflammatory changes. Spleen: Normal in size without focal abnormality. Adrenals/Urinary Tract: The adrenal glands are unremarkable. There is a 2.4 x 2.4 cm  nonobstructing staghorn calculus in the upper pole of the right kidney. There is parenchyma atrophy and cortical scarring of the upper pole of the right kidney. There is a 12 mm nonobstructing stone in the inferior pole of the left kidney. There is no hydronephrosis or obstructing calculi. Bilateral renal cysts poorly evaluated on this noncontrast CT. The visualized ureters and urinary bladder appear unremarkable. Stomach/Bowel: There is moderate stool throughout the colon. There is no bowel obstruction or active inflammation. The appendix is normal. Vascular/Lymphatic: Mild aortoiliac atherosclerotic disease. The IVC is unremarkable. No portal venous gas. There is no adenopathy. Reproductive: Myomatous uterus. Other: Postsurgical changes of the right breast. There is a 15 x 4 cm fluid collection in the subcutaneous soft tissues of the right anterior chest wall as seen on the prior CT, likely a seroma. No retroperitoneal or intraperitoneal bleed. Musculoskeletal: Osteopenia with degenerative changes of the spine. No acute osseous pathology. IMPRESSION: 1. No acute intra-abdominal or pelvic pathology. No retroperitoneal or intraperitoneal bleed. 2. Bilateral nonobstructing renal calculi. No hydronephrosis. 3. Right anterior chest wall seroma as seen on the prior CT. 4.  Aortic Atherosclerosis (ICD10-I70.0). Electronically Signed   By: Elgie Collard M.D.   On: 08/06/2022 21:33     Pamella Pert, MD, PhD [facility]  Between 7 am - 7 pm I am available, please contact me via Amion (for emergencies) or Securechat (non urgent messages)  Between 7 pm - 7 am I am not available, please contact night coverage MD/APP via Amion

## 2022-08-07 NOTE — Progress Notes (Signed)
ANTICOAGULATION CONSULT NOTE   Pharmacy Consult for Eliquis Indication: pulmonary embolus  No Known Allergies  Patient Measurements: Height: 5\' 8"  (172.7 cm) Weight: 71.7 kg (158 lb) IBW/kg (Calculated) : 63.9 Heparin Dosing Weight: actual weight  Vital Signs: Temp: 97.6 F (36.4 C) (07/21 0517) Temp Source: Oral (07/21 0517) BP: 135/84 (07/21 1114) Pulse Rate: 76 (07/21 1114)  Labs: Recent Labs     0000 08/04/22 1750 08/05/22 0435 08/05/22 0438 08/06/22 0400 08/06/22 0432 08/06/22 1218 08/07/22 0515  HGB   < >  --  12.5  --   --  12.3  --  13.4  HCT  --   --  39.4  --   --  38.6  --  42.0  PLT  --   --  539*  --   --  461*  --  524*  APTT  --  29  --   --   --   --   --   --   LABPROT  --  15.5*  --   --   --   --   --   --   INR  --  1.2  --   --   --   --   --   --   HEPARINUNFRC  --   --   --    < > 0.46  --  0.55 0.56  CREATININE  --   --  0.78  --   --  0.58  --  0.61   < > = values in this interval not displayed.    Estimated Creatinine Clearance: 60.4 mL/min (by C-G formula based on SCr of 0.61 mg/dL).   Baseline Labs: 7/17: Hgb = 11.6, low 7/17: Plt =430, elevated 7/17: Scr = 0.5  Assessment: Patient is a 77 y.o. 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    Presented to the ED 07/31/2022 for evaluation of altered mental status/confusion, poor appetite, and generalized weakness.   08/04/2022 CT Chest:  acute PE with moderate thrombus burden and right ventricular strain  Pharmacy consulted to transition Heparin to  Eliquis  08/07/2022 Hgb WNL, PLT up 524 No bleeding or complications of therapy per nurse  Goal of Therapy:  Monitor platelets by anticoagulation protocol: Yes   Plan:  - Discontinue Heparin IV now - Start Eliquis 10mg  po bid x 7 days, then 5mg  po bid thereafter - Continue to monitor H&H and platelets - Monitor CBC and Scr at minimum weekly  (CBC ordered daily for 3 more days) - Monitor for signs/symptoms of bleeding  Josefa Half, PharmD, BCPS Clinical Pharmacist 08/07/2022 12:35 PM

## 2022-08-07 NOTE — Progress Notes (Signed)
ANTICOAGULATION CONSULT NOTE   Pharmacy Consult for Heparin Indication: pulmonary embolus  No Known Allergies  Patient Measurements: Height: 5\' 8"  (172.7 cm) Weight: 71.7 kg (158 lb) IBW/kg (Calculated) : 63.9 Heparin Dosing Weight: actual weight  Vital Signs: Temp: 97.6 F (36.4 C) (07/21 0517) Temp Source: Oral (07/21 0517) BP: 123/78 (07/21 0517) Pulse Rate: 78 (07/21 0517)  Labs: Recent Labs     0000 08/04/22 1750 08/05/22 0435 08/05/22 0438 08/06/22 0400 08/06/22 0432 08/06/22 1218 08/07/22 0515  HGB   < >  --  12.5  --   --  12.3  --  13.4  HCT  --   --  39.4  --   --  38.6  --  42.0  PLT  --   --  539*  --   --  461*  --  524*  APTT  --  29  --   --   --   --   --   --   LABPROT  --  15.5*  --   --   --   --   --   --   INR  --  1.2  --   --   --   --   --   --   HEPARINUNFRC  --   --   --    < > 0.46  --  0.55 0.56  CREATININE  --   --  0.78  --   --  0.58  --  0.61   < > = values in this interval not displayed.    Estimated Creatinine Clearance: 60.4 mL/min (by C-G formula based on SCr of 0.61 mg/dL).   Baseline Labs: 7/17: Hgb = 11.6, low 7/17: Plt =430, elevated 7/17: Scr = 0.5  Assessment: Patient is a 77 y.o. 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    Presented to the ED 07/31/2022 for evaluation of altered mental status/confusion, poor appetite, and generalized weakness.   08/04/2022 CT Chest:  acute PE with moderate thrombus burden and right ventricular strain  08/07/2022 Heparin level 0.56 (therapeutic) with heparin infusion  at 800 units/hr Hgb WNL, PLT up 524 No bleeding or complications of therapy per nurse  Goal of Therapy:  Heparin level 0.3-0.7 units/ml Monitor platelets by anticoagulation protocol: Yes   Plan:  -Continue heparin infusion at 800 units/hr -Daily CBC & heparin level  -Continue to monitor H&H and platelets -Monitor for signs/symptoms of bleeding  Lynden Ang, PharmD, BCPS Clinical Pharmacist 08/07/2022 6:58 AM

## 2022-08-08 ENCOUNTER — Inpatient Hospital Stay: Payer: 59

## 2022-08-08 ENCOUNTER — Inpatient Hospital Stay: Payer: 59 | Admitting: Hematology and Oncology

## 2022-08-08 DIAGNOSIS — Z515 Encounter for palliative care: Secondary | ICD-10-CM | POA: Diagnosis not present

## 2022-08-08 DIAGNOSIS — G9340 Encephalopathy, unspecified: Secondary | ICD-10-CM | POA: Diagnosis not present

## 2022-08-08 DIAGNOSIS — G936 Cerebral edema: Secondary | ICD-10-CM | POA: Diagnosis not present

## 2022-08-08 DIAGNOSIS — R319 Hematuria, unspecified: Secondary | ICD-10-CM | POA: Diagnosis not present

## 2022-08-08 DIAGNOSIS — N390 Urinary tract infection, site not specified: Secondary | ICD-10-CM | POA: Diagnosis not present

## 2022-08-08 LAB — GLUCOSE, CAPILLARY
Glucose-Capillary: 108 mg/dL — ABNORMAL HIGH (ref 70–99)
Glucose-Capillary: 143 mg/dL — ABNORMAL HIGH (ref 70–99)
Glucose-Capillary: 152 mg/dL — ABNORMAL HIGH (ref 70–99)

## 2022-08-08 LAB — CBC
HCT: 44.5 % (ref 36.0–46.0)
Hemoglobin: 14.4 g/dL (ref 12.0–15.0)
MCH: 31.4 pg (ref 26.0–34.0)
MCHC: 32.4 g/dL (ref 30.0–36.0)
MCV: 96.9 fL (ref 80.0–100.0)
Platelets: 491 10*3/uL — ABNORMAL HIGH (ref 150–400)
RBC: 4.59 MIL/uL (ref 3.87–5.11)
RDW: 14 % (ref 11.5–15.5)
WBC: 10 10*3/uL (ref 4.0–10.5)
nRBC: 0 % (ref 0.0–0.2)

## 2022-08-08 LAB — CREATININE, SERUM
Creatinine, Ser: 0.57 mg/dL (ref 0.44–1.00)
GFR, Estimated: >60 mL/min (ref >60)

## 2022-08-08 NOTE — Progress Notes (Signed)
PROGRESS NOTE  Jennifer Cowan WGN:[id number] DOB: August 23, 1945 DOA: 07/31/2022 PCP: [name], FNP   LOS: 8 days   Brief Narrative / Interim history: 77 y.o. 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  In the ED, patient was slightly tachycardic but afebrile.  Not hypotensive.  Labs showing no leukocytosis or anemia, potassium 3.4, creatinine 0.5, ammonia level <10, SARS-CoV-2 PCR negative. UA showing negative nitrite, moderate leukocytes, and microscopy showing 6-10 RBCs, 11-20 WBCs, and no bacteria.  Chest x-ray showing small left pleural effusion. Head CT with no significant change in extensive vasogenic edema throughout the right temporal and parietal lobes, and involving the right thalamus and basal ganglia. Mild right-to-left midline shift measuring 5 mm also shows no significant change.   Subjective / 24h Interval events: Complains of pain, says it is her back but she was saying I am in pain regardless of where I would point to, including shoulders, legs, ankles, knees  Assesement and Plan: Principal Problem:   UTI (urinary tract infection) Active Problems:   Metastasis to brain [facility])   History of breast cancer   Malignant neoplasm of unspecified part of unspecified bronchus or lung (HCC)   Type 2 diabetes mellitus (HCC)   Renal calculi   Microscopic hematuria   Acute encephalopathy   Weakness   Cerebral edema (HCC)   Pain   High risk medication use    Counseling and coordination of care   Palliative care encounter   Constipation   Principal problem UTI with hematuria , renal calculi - CT on admission with 2.2 cm 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 Case was discussed with Dr Marlou Porch, did not recommend any intervention for now.  Discussed again today with urology, without hydronephrosis or signs of obstruction it is unlikely that this would be the cause for her pain -Due to persistent pain underwent repeat imaging 7/20 which did not show any hydronephrosis, reiterated presence of bilateral nonobstructive renal calculi -Urine cultures have been negative.  Has been maintained on ceftriaxone, switched to oral equivalent and has completed a 5-day course  Active problems  Acute pulmonary embolism - probably causing her back pain to a certain extent. Discussed with Dr Laurell Josephs and Dr Al Pimple, started on anticoagulation. Closely monitor with her history of brain hemorrhage. Most recent CT scan without evidence of bleeding. -Hemoglobin stable, clearly no bleeding, start Eliquis today  Acute encephalopathy, in-hospital delirium, lung cancer with brain metastases, breast cancer, persistent pain- CT head showing stable extensive vasogenic edema and stable mild right to left midline shift measuring 5 mm.  No evidence of intracranial hemorrhage on CT. Continue Keppra, Decadron -Per Dr. Barbaraann Cao- Once improved, can dc on 4mg  BID PO steroids and follow up with him outpatient. He will plan to repeat brain MRI Aug 8th. -Wean off Decadron to 4 mg BID today in anticipation for discharge in the next few days -More confused this morning, repeat CT scan with similar findings as before, no bleeding -I suspect her migratory pain may be in the setting of her underlying hospital induced delirium, recommend early  mobilization and today, PT  Generalized weakness - In the setting of known cancer and UTI.  Also she was previously on steroids which were tapered off a week ago  which could be contributing. 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  Patient had moderate pericardial effusion on echo done in February 2024 which had essentially resolved on repeat echo done in April.  CT showing trace pericardial effusion.   Left pleural effusion  -Patient had tiny left pleural effusion on CT back in February 2024 and no thoracentesis was performed.  CT showing trace left pleural effusion.    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   Hyperlipidemia - Continue Lipitor.   Hypertension - Blood pressure stable.  Continue atenolol.   Diet controlled type 2 diabetes - A1c 5.9 in June 2023. 6.2 this admission and has been on steroids long-term. Continue SSI  Lab Results  Component Value Date   HGBA1C 6.2 (H) 08/01/2022   CBG (last 3)  Recent Labs    08/07/22 1606 08/07/22 2112 08/08/22 0733  GLUCAP 111* 130* 108*   Scheduled Meds:  acetaminophen  1,000 mg Oral Q8H   apixaban  10 mg Oral BID   Followed by   Melene Muller ON 08/14/2022] apixaban  5 mg Oral BID   atenolol  12.5 mg Oral Daily   atorvastatin  80 mg Oral QHS   Chlorhexidine Gluconate Cloth  6 each Topical Daily   dexamethasone  4 mg Oral Daily   And   dexamethasone  4 mg Oral QAC supper   fentaNYL  1 patch Transdermal Q72H   insulin aspart  0-9 Units Subcutaneous TID WC   levETIRAcetam  500 mg Oral BID   lidocaine  2 patch Transdermal Q24H   pantoprazole  40 mg Oral Daily   polyethylene glycol  17 g Oral BID   QUEtiapine  12.5 mg Oral BID   senna  2 tablet Oral BID   Continuous Infusions:   PRN Meds:.HYDROmorphone (DILAUDID) injection, naLOXone (NARCAN)  injection, oxyCODONE, sodium chloride flush  Current Outpatient Medications  Medication Instructions   Accu-Chek Softclix Lancets lancets Use to check blood sugar as directed 4 times a day   acetaminophen (TYLENOL) 1,000 mg, Oral, Every 6 hours PRN    amLODipine (NORVASC) 10 MG tablet TAKE 1 TABLET BY MOUTH DAILY   aspirin (EQ ASPIRIN) 325 mg, Oral, Daily   atenolol (TENORMIN) 12.5 mg, Oral, Daily   atorvastatin (LIPITOR) 80 mg, Oral, Daily   benazepril (LOTENSIN) 10 MG tablet TAKE 1 TABLET BY MOUTH DAILY   blood glucose meter kit and supplies KIT Dispense based on patient and insurance preference. Use up to four times daily as directed.   Blood Glucose Monitoring Suppl (ACCU-CHEK GUIDE) w/Device KIT Use as directed 4 times a day   ferrous sulfate 325 mg, Oral, Daily with breakfast   gabapentin (NEURONTIN) 300 mg, Oral, 2 times daily   glucose blood (ACCU-CHEK GUIDE) test strip Use as directed 4 times a day   ketoconazole (NIZORAL) 2 % cream 1 Application, Topical, See admin instructions, Apply to both feet 2 times a day   levETIRAcetam (KEPPRA) 500 mg, Oral, 2 times daily   lidocaine-prilocaine (EMLA) cream 1 Application, Topical, As needed   meloxicam (MOBIC) 7.5 MG tablet TAKE 1 TABLET BY MOUTH DAILY   ondansetron (ZOFRAN) 8 mg, Oral, Every 8 hours PRN   oxyCODONE (OXY IR/ROXICODONE) 5-10 mg, Oral,  Every 4 hours PRN   prochlorperazine (COMPAZINE) 10 mg, Oral, Every 6 hours PRN   simvastatin (ZOCOR) 40 mg, Oral, Daily   spironolactone (ALDACTONE) 12.5 mg, Oral, Daily   traMADol (ULTRAM) 50 mg, Oral, Every 6 hours PRN   Vitamin D3 5,000 Units, Oral, Daily    Diet Orders (From admission, onward)     Start     Ordered   07/31/22 2328  Diet Carb Modified Fluid consistency: Thin; Room service appropriate? Yes  Diet effective now       Question Answer Comment  Diet-HS Snack? Nothing   Calorie Level Medium 1600-2000   Fluid consistency: Thin   Room service appropriate? Yes      07/31/22 2332            DVT prophylaxis:  apixaban (ELIQUIS) tablet 10 mg  apixaban (ELIQUIS) tablet 5 mg   Lab Results  Component Value Date   PLT 491 (H) 08/08/2022      Code Status: Full Code  Family Communication: no family at bedside,  updated niece over the phone  Status is: Inpatient Remains inpatient appropriate because: severity of illness, confusion  Level of care: Telemetry  Consultants:  none  Objective: Vitals:   08/07/22 0517 08/07/22 1114 08/07/22 2102 08/08/22 0616  BP: 123/78 135/84 (!) 137/98 126/72  Pulse: 78 76 (!) 59 72  Resp: 13  18 20   Temp: 97.6 F (36.4 C)  97.8 F (36.6 C) 98.1 F (36.7 C)  TempSrc: Oral  Oral Oral  SpO2: 95%  95% 94%  Weight:      Height:        Intake/Output Summary (Last 24 hours) at 08/08/2022 1028 Last data filed at 08/08/2022 1610 Gross per 24 hour  Intake 350 ml  Output --  Net 350 ml   Wt Readings from Last 3 Encounters:  07/31/22 71.7 kg  07/27/22 69.2 kg  07/18/22 67.8 kg    Examination:  Constitutional: confused Eyes: lids and conjunctivae normal, no scleral icterus ENMT: mmm Neck: normal, supple Respiratory: clear to auscultation bilaterally, no wheezing, no crackles. Normal respiratory effort.  Cardiovascular: Regular rate and rhythm, no murmurs / rubs / gallops. No LE edema. Abdomen: soft, no distention, no tenderness. Bowel sounds positive.   Data Reviewed: I have independently reviewed following labs and imaging studies   CBC Recent Labs  Lab 08/03/22 0555 08/05/22 0435 08/06/22 0432 08/07/22 0515 08/08/22 0556  WBC 5.3 10.2 8.7 9.3 10.0  HGB 11.6* 12.5 12.3 13.4 14.4  HCT 36.2 39.4 38.6 42.0 44.5  PLT 430* 539* 461* 524* 491*  MCV 98.9 98.5 98.7 98.1 96.9  MCH 31.7 31.3 31.5 31.3 31.4  MCHC 32.0 31.7 31.9 31.9 32.4  RDW 14.6 14.4 14.4 14.2 14.0    Recent Labs  Lab 08/02/22 0432 08/03/22 0555 08/04/22 1750 08/05/22 0435 08/06/22 0432 08/07/22 0515 08/08/22 0556  NA 141 139  --  136 137 138  --   K 3.5 3.6  --  3.9 4.1 3.8  --   CL 112* 108  --  105 104 103  --   CO2 23 23  --  25 28 27   --   GLUCOSE 116* 128*  --  147* 161* 156*  --   BUN 14 11  --  17 15 17   --   CREATININE 0.62 0.50  --  0.78 0.58 0.61 0.57   CALCIUM 8.6* 8.7*  --  9.2 9.1 9.4  --   AST  --   --   --   --   --  33  --   ALT  --   --   --   --   --  68*  --   ALKPHOS  --   --   --   --   --  56  --   BILITOT  --   --   --   --   --  0.5  --   ALBUMIN  --   --   --   --   --  3.1*  --   MG  --   --   --  2.2 2.2 2.1  --   INR  --   --  1.2  --   --   --   --     ------------------------------------------------------------------------------------------------------------------ No results for input(s): "CHOL", "HDL", "LDLCALC", "TRIG", "CHOLHDL", "LDLDIRECT" in the last 72 hours.  Lab Results  Component Value Date   HGBA1C 6.2 (H) 08/01/2022   ------------------------------------------------------------------------------------------------------------------ No results for input(s): "TSH", "T4TOTAL", "T3FREE", "THYROIDAB" in the last 72 hours.  Invalid input(s): "FREET3"  Cardiac Enzymes No results for input(s): "CKMB", "TROPONINI", "MYOGLOBIN" in the last 168 hours.  Invalid input(s): "CK" ------------------------------------------------------------------------------------------------------------------ No results found for: "BNP"  CBG: Recent Labs  Lab 08/07/22 0732 08/07/22 1116 08/07/22 1606 08/07/22 2112 08/08/22 0733  GLUCAP 147* 143* 111* 130* 108*    Recent Results (from the past 240 hour(s))  SARS Coronavirus 2 by RT PCR (hospital order, performed in [facility] hospital lab) *cepheid single result test* Anterior Nasal Swab     Status: None   Collection Time: 07/31/22  1:56 PM   Specimen: Anterior Nasal Swab  Result Value Ref Range Status   SARS Coronavirus 2 by RT PCR NEGATIVE NEGATIVE Final    Comment: (NOTE) SARS-CoV-2 target nucleic acids are NOT DETECTED.  The SARS-CoV-2 RNA is generally detectable in upper and lower respiratory specimens during the acute phase of infection. The lowest concentration of SARS-CoV-2 viral copies this assay can detect is 250 copies / mL. A negative result does not  preclude SARS-CoV-2 infection and should not be used as the sole basis for treatment or other patient management decisions.  A negative result may occur with improper specimen collection / handling, submission of specimen other than nasopharyngeal swab, presence of viral mutation(s) within the areas targeted by this assay, and inadequate number of viral copies (<250 copies / mL). A negative result must be combined with clinical observations, patient history, and epidemiological information.  Fact Sheet for Patients:   RoadLapTop.co.za  Fact Sheet for Healthcare Providers: http://kim-miller.com/  This test is not yet approved or  cleared by the [place] FDA and has been authorized for detection and/or diagnosis of SARS-CoV-2 by FDA under an Emergency Use Authorization (EUA).  This EUA will remain in effect (meaning this test can be used) for the duration of the COVID-19 declaration under Section 564(b)(1) of the Act, 21 U.S.C. section 360bbb-3(b)(1), unless the authorization is terminated or revoked sooner.  Performed at [facility], [address], [place], [place] [address]   Urine Culture (for pregnant, neutropenic or urologic patients or patients with an indwelling urinary catheter)     Status: Abnormal   Collection Time: 07/31/22  2:26 PM   Specimen: Urine, Clean Catch  Result Value Ref Range Status   Specimen Description   Final    URINE, CLEAN CATCH Performed at [facility], [address] [place], [place] [address]    Special Requests   Final  NONE Performed at [facility], [address], [place], [place] [address]    Culture MULTIPLE SPECIES PRESENT, SUGGEST RECOLLECTION (A)  Final   Report Status 08/02/2022 FINAL  Final  Culture, blood (Routine X 2) w Reflex to ID Panel     Status: None   Collection Time: 08/01/22 12:26 AM   Specimen: BLOOD LEFT ARM   Result Value Ref Range Status   Specimen Description   Final    BLOOD LEFT ARM BOTTLES DRAWN AEROBIC AND ANAEROBIC Performed at [facility], [address], [place], [place] [address]    Special Requests   Final    Blood Culture adequate volume Performed at [facility], [address], [place], [place] [address]    Culture   Final    NO GROWTH 5 DAYS Performed at [facility], [address], [place], [place] [address]    Report Status 08/06/2022 FINAL  Final  Culture, blood (Routine X 2) w Reflex to ID Panel     Status: None   Collection Time: 08/01/22 12:32 AM   Specimen: BLOOD LEFT HAND  Result Value Ref Range Status   Specimen Description   Final    BLOOD LEFT HAND BOTTLES DRAWN AEROBIC ONLY Performed at [facility], [address] [place], [place] [address]    Special Requests   Final    Blood Culture adequate volume Performed at [facility], [address], [place], [place] [address]    Culture   Final    NO GROWTH 5 DAYS Performed at [facility], [address] [place], [place] [address]    Report Status 08/06/2022 FINAL  Final     Radiology Studies: No results found.   Pamella Pert, MD, PhD [facility]  Between 7 am - 7 pm I am available, please contact me via Amion (for emergencies) or Securechat (non urgent messages)  Between 7 pm - 7 am I am not available, please contact night coverage MD/APP via Amion

## 2022-08-08 NOTE — TOC Progression Note (Addendum)
Transition of Care [facility]) - Progression Note    Patient Details  Name: Jennifer Cowan MRN: [id number] Date of Birth: Mar 08, 1945  Transition [facility] CM/SW Contact  Beckie Busing, RN Phone Number:[phone]  08/08/2022, 8:54 AM  Clinical Narrative:    Talbot Grumbling requesting updated info on patients prior level of function for insurance auth. CM spoke with niece Phil Dopp who confirms that prior to admission patient was independent with occasional use of rolling walker. This info has been communicated to [employer] for insurance auth .   1300 CM at bedside to present bed offers to patients niece [name]. [name] reviewing list states that she is currently on her lunch break from work and  would like to research list and will call CM back with choice be the end of the day.   35 Niece Phil Dopp has called with facility choice. Per Niece the family would like placement at [facility]. CM has reached out to [facility] at [facility]. There is no answer. Voicemail has been left.    Expected Discharge Plan: Skilled Nursing Facility Barriers to Discharge: Continued Medical Work up  Expected Discharge Plan and Services In-house Referral: NA Discharge Planning Services: CM Consult Post Acute Care Choice: Skilled Nursing Facility Living arrangements for the past 2 months: Single Family Home                 DME Arranged: N/A DME Agency: NA       HH Arranged: NA HH Agency: NA         Social Determinants of Health (SDOH) Interventions SDOH Screenings   Food Insecurity: No Food Insecurity (06/27/2022)  Housing: Low Risk  (06/27/2022)  Transportation Needs: No Transportation Needs (06/27/2022)  Utilities: Not At Risk (06/27/2022)  Depression (PHQ2-9): High Risk (07/27/2022)  Tobacco Use: Low Risk  (07/29/2022)  Health Literacy: Low Risk  (02/06/2020)   Received from [facility]    Readmission Risk Interventions    08/05/2022   11:44 AM  Readmission Risk  Prevention Plan  Transportation Screening Complete  PCP or Specialist Appt within 5-7 Days Complete  Home Care Screening Complete  Medication Review (RN CM) Referral to Pharmacy

## 2022-08-08 NOTE — Progress Notes (Signed)
Daily Progress Note   Patient Name: Jennifer Cowan       Date: 08/08/2022 DOB: [birth year]  Age: 77 y.o. MRN#: [id number] Attending Physician: Leatha Gilding, MD Primary Care Physician: [name], FNP Admit Date: 07/31/2022 Length of Stay: 8 days  Reason for Consultation/Follow-up: Establishing goals of care and Pain control  Subjective:   CC: Patient confused if took her medications this morning. Following up regarding complex medical decision making and symptom management.   Subjective:  Reviewed EMR prior to presenting to bedside.  At time of EMR review patient had received IV Dilaudid 0.5 mg x 6 doses and p.o. oxycodone 10 mg x 5 doses.  Patient remains on fentanyl patch at 25 mcg/h.  Patient's dexamethasone has been decreased to 4 mg twice daily to assist with delirium management.  Presented to bedside to meet with patient.  Patient no longer has sitter in place.  Patient initially moaning though easily redirectable.  When inquired about her concerns, patient notes she was moaning because she was not sure if she took her morning medications.  I assured her that RN had provided her morning medications.  Inquired about patient's pain which she notes is now in her upper back.  Again discussed patient's pain is more generalized and seems to be arthritic in nature.  Likely also worsened from patient remaining in the bed.  Encourage patient to get out of the bed even if to sit in the bedside chair with help.  Patient willing to sit in the chair noting she thinks she would feel better getting out of the bed.  All questions answered at that time.  Discussed care with IDT after visit with patient.  Objective:   Vital Signs:  BP 126/72 (BP Location: Left Arm)   Pulse 72   Temp 98.1 F (36.7 C) (Oral)   Resp 20   Ht 5\' 8"  (1.727 m)   Wt 71.7 kg   SpO2 94%   BMI 24.02 kg/m   Physical Exam: General: awake, laying in bed, moaning at times-but will stop easily when engaged in  conversation and is redirected, interactive Eyes:no drainage noted HENT: moist mucous membranes Cardiovascular: RRR Respiratory: no increased work of breathing noted, not in respiratory distress Skin: no rashes or lesions on visible skin Neuro: delirium  Imaging:  I personally reviewed recent imaging.   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  Since being admitted patient has been managed with antibiotics for suspected urinary tract infection.  Hospitalization complicated due to delirium.  Patient also being followed by oncology/neuro-oncology.  Palliative medicine team consulted to assist with complex medical decision making. Of note, patient last seen by outpatient palliative medicine team at [facility] on 6/3/4.   Recommendations/Plan: # Complex medical decision making/goals of care:  -Patient's current goals for medical care at to regain strength at rehab. Patient reported to be walking with only walker assistance before this. Onc noted their plan is to continue patient on maintenance therapy for cancer as  appears to be stable.   -  Code Status: Full Code  # Symptom management:  -Pain, in setting of metastatic cancer Patient has difficulty pinpointing exact pain which can be reported to be different every day.  Patient is suffering from aspects of delirium.  Have attempted to appropriately manage pain to  assist with delirium management and concerned there may be no further benefits from increasing pain medications at this time since pain appears to be arthritic in nature and worsened by patient's sedentary state in the bed.   - Continue fentanyl to 68mcg/hr q72 hours patch.                                -Continue Tylenol 1000 mg every 8 hours scheduled during the day.                               -Continue  lidocaine patch to back                               -Continue oxycodone to 5-10 mg every 4 hours as needed for breakthrough pain management. -Do no recommend use of muscle relaxants as will likely on worsen mentioned, not improve management of pain -Previously has patient on scheduled ibuprofen though discontinued once start on heparin for PE  -Encouraged involvement with PT/OT and getting patient up and out of the bed.   -Constipation BM noted on 7/20 after suppository given.    -Continue senna 2 tabs BID   -Continue miralax 17gm BID                  -Delirium, improving Non-pharmacologic Delirium Precautions  - Frequent re-orientation; update board aw/ date, names of treatment team  - Daytime stimulation: Open curtains, turn lights on, and turn on television as tolerated during waking hours  - Nighttime calm - close curtains, turn lights off, and turn off television at 9pm  with minimal interruptions from treatment team during sleeping hours, including avoiding lab draws if possible  - Encourage continued presence of family/friends  - Occupy w/ distractions - e.g. ask them to fold washcloths, busy-board  - Encourage movement with PT/OT  - Ensure adequate sensorium, to include providing glasses, dentures, and hearing aids to patient as appropriate  - Ensure adequate nutrition and fluid intake  - Assess and treat pain (incl nonverbal cues) - Assess and treat constipation and urinary retention  - Ensure hydration, electrolyte balance, adequate oxygenation  - Review medications  (addition, deletion, changes can trigger)                                -Steroids dose lowered again 7/21 to assist with management of delirium                               -Receiving Seroquel 15.5mg  BID                                              -EKG on 7/10 showed Qtc at 432.   # Psychosocial Support:  - Support System:  niece- Phil Dopp, sisterElease Hashimoto    # Discharge Planning:  Skilled Nursing Facility for rehab with Palliative care service follow-up                -Patient already being seen by outpatient palliative medicine team at [facility]. Please make sure to schedule follow up at time of discharge.   Discussed with: patient, RN, hospitalist  Thank you for allowing the palliative care team to participate in the care [name].  Alvester Morin, DO Palliative Care Provider PMT # [phone]  If patient remains symptomatic despite maximum doses, please call PMT at [phone] between 0700 and 1900. Outside of these hours, please call attending, as PMT does not have night coverage.  This provider spent a total of 36 minutes providing patient's care.  Includes review of EMR, discussing care with other staff members involved in patient's medical care, obtaining relevant history and information from patient and/or patient's family, and personal review of imaging and lab work. Greater than 50% of the time was spent counseling and coordinating care related to the above assessment and plan.    *Please note that this is a verbal dictation therefore any spelling or grammatical errors are due to the "[employer]" system interpretation.

## 2022-08-08 NOTE — Progress Notes (Signed)
Physical Therapy Treatment Patient Details Name: Jennifer Cowan MRN: [id number] DOB: Aug 09, 1945 Today's Date: 08/08/2022   History of Present Illness 77 yo female presents to therapy following hospital admission on 07/31/2022 due to AMS, poor PO intake and generalized weakness. Pt was found to have UTI and acute encephalopathy. CT of head 07/31/2022 indicates no evidence of intracranial hemorrhage, no significant change in extensive vasogenic edema throughout the right temporal and parietal lobes, and involving the right thalamus and basal ganglia. CT of abdomen and pelvis 7/14 reveals spurring along the femoral necks and femoral heads bilaterally, lumbar spondylosis and degenerative disc disease with resulting bilateral foraminal impingement at L4-5 as well as kidney stone. CXR shows small L pleural effusion. 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    PT Comments   Pt admitted with above diagnosis.  Pt currently with functional limitations due to the deficits listed below (see PT Problem List). Pt in bed when PT arrived. Pt agreeable to therapy intervention initially for getting OOB and sitting in recliner. Pt required extensive assist for supine <> sit, mod A for initial sitting EOB with strong posterior lean. Pt ed provided on increased back pain with pushing response and pt able to transition to upright midline unsupported sitting EOB with S for ~ 10 mins. Once seated EOB pt reported need to void, pt set up with RW and BSC to the L  to assist pt with toileting task. Pt however unable to participate with transfer tasks pull to stand or push to stand from elevated EOB with max A. Pt required max A to return to bed. PT communicated with nursing staff per pain report, inability to assist pt bed <> BSC and need to void. NT reported no concerns with  transfers bed <> BSC earlier in the am. Pt left in bed all needs in place. Pt is demonstrating minimal progress toward PT and personal goals. Patient will benefit from continued inpatient follow up therapy, <3 hours/day. Pt will benefit from acute skilled PT to increase their independence and safety with mobility to allow discharge.       Assistance Recommended at Discharge Frequent or constant Supervision/Assistance  If plan is discharge home, recommend the following:  Can travel by private vehicle    Two people to help with walking and/or transfers;A lot of help with bathing/dressing/bathroom;Assistance with cooking/housework;Direct supervision/assist for medications management;Direct supervision/assist for financial management;Assist for transportation;Help with stairs or ramp for entrance   No  Equipment Recommendations  None recommended by PT    Recommendations for Other Services       Precautions / Restrictions Precautions Precautions: Fall Restrictions Weight Bearing Restrictions: No     Mobility  Bed Mobility Overal bed mobility: Needs Assistance Bed Mobility: Supine to Sit, Sit to Supine     Supine to sit: Max assist Sit to supine: Max assist   General bed mobility comments: required increased time and effort, as well as general multimodal cues for sequencing/transfer technique. once seated EOB pt pushing posteriorly with trunk and pt ed provided on increased pain from posterior lean pt able to self correct and sit EOB with close S maintaining attention to midline    Transfers                   General transfer comment: NT. pt reported need to void bladder  and PT set up for transfer with [facility] and RW. pt reported that her back hurt, 2 attempts for pull to stand from EOB with PT unable to assist to standing with pt resistive and rpeorting her back hurt. nurse and NT made aware. NT reports no concerns with bed <> BSC commode transfer in am and then s/p tx session  PT in hallway and NT had assisted pt to [facility].    Ambulation/Gait         Gait velocity: NT due to pt pain report and extensive assist for functional mobiltiy tasks         Stairs             Wheelchair Mobility     Tilt Bed    Modified Rankin (Stroke Patients Only)       Balance Overall balance assessment: Needs assistance Sitting-balance support: Feet supported Sitting balance-Leahy Scale: Good                                      Cognition Arousal/Alertness: Awake/alert Behavior During Therapy: Anxious Overall Cognitive Status: No family/caregiver present to determine baseline cognitive functioning Area of Impairment: Orientation, Attention, Memory, Following commands, Awareness, Problem solving                 Orientation Level: Disoriented to, Time, Situation Current Attention Level: Focused Memory: Decreased short-term memory Following Commands: Follows one step commands inconsistently   Awareness: Intellectual Problem Solving: Slow processing, Decreased initiation, Difficulty sequencing, Requires verbal cues General Comments: Oriented to person and to being in the hospital. Disoriented to city, as she reported being in [place]. She reported the current year to be "1947."        Exercises      General Comments        Pertinent Vitals/Pain Pain Assessment Pain Assessment: Faces Faces Pain Scale: Hurts whole lot Pain Location: back Pain Descriptors / Indicators: Constant, Discomfort, Guarding, Grimacing, Moaning, Restless Pain Intervention(s): Limited activity within patient's tolerance, Monitored during session    Home Living                          Prior Function            PT Goals (current goals can now be found in the care plan section) Acute Rehab PT Goals PT Goal Formulation: Patient unable to participate in goal setting Time For Goal Achievement: 08/16/22 Potential to Achieve Goals:  Poor Progress towards PT goals: Progressing toward goals (slow progression toward goals due to pain report and percived behaviors)    Frequency    Min 1X/week      PT Plan Current plan remains appropriate    Co-evaluation              AM-PAC PT "6 Clicks" Mobility   Outcome Measure  Help needed turning from your back to your side while in a flat bed without using bedrails?: A Lot Help needed moving from lying on your back to sitting on the side of a flat bed without using bedrails?: A Lot Help needed moving to and from a bed to a chair (including a wheelchair)?: Total Help needed standing up from a chair using your arms (e.g., wheelchair or bedside chair)?: Total Help needed to walk in hospital room?: Total Help needed climbing 3-5 steps with a railing? : Total 6 Click Score: 8  End of Session Equipment Utilized During Treatment: Gait belt Activity Tolerance: No increased pain Patient left: in bed;with call bell/phone within reach;with bed alarm set Nurse Communication: Mobility status;Patient requests pain meds PT Visit Diagnosis: Unsteadiness on feet (R26.81);Difficulty in walking, not elsewhere classified (R26.2);Adult, failure to thrive (R62.7);Pain Pain - Right/Left:  (back)     Time: 2956-2130 PT Time Calculation (min) (ACUTE ONLY): 24 min  Charges:    $Therapeutic Activity: 23-37 mins PT General Charges $$ ACUTE PT VISIT: 1 Visit                     Johnny Bridge, PT Acute Rehab    Jacqualyn Posey 08/08/2022, 9:30 AM

## 2022-08-08 NOTE — Consult Note (Signed)
Urology Consult Note   Requesting Attending Physician:  Leatha Gilding, MD Service Providing Consult: Urology  Consulting Attending: Dr. Liliane Shi   Reason for Consult:  staghorn calculus  HPI: Jennifer Cowan is seen in consultation for reasons noted above at the request of Leatha Gilding, MD  This is a 77 y.o. 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 She had been complaining of diffuse pain. CT A/P revealed 2.4X2.4 staghorn calculus. Urology was consulted to speak to this finding.    Past Medical History: Past Medical History:  Diagnosis Date   Anemia    Arthritis    Breast cancer (HCC)    Cancer (HCC) 1999   Breast Cancer   Family history of breast cancer    Hyperlipidemia    Hypertension    Osteopenia 01/12/2021   Personal history of malignant neoplasm of breast     Past Surgical History:  Past Surgical History:  Procedure Laterality Date   BREAST SURGERY  1999   Masectomy- left   BRONCHIAL BIOPSY  07/30/2021   Procedure: BRONCHIAL BIOPSIES;  Surgeon: Leslye Peer, MD;  Location: [facility] ENDOSCOPY;  Service: Pulmonary;;   BRONCHIAL BRUSHINGS  07/30/2021   Procedure: BRONCHIAL BRUSHINGS;  Surgeon: Leslye Peer, MD;  Location: [facility] ENDOSCOPY;  Service: Pulmonary;;   BRONCHIAL NEEDLE ASPIRATION BIOPSY  07/30/2021   Procedure: BRONCHIAL NEEDLE ASPIRATION BIOPSIES;  Surgeon: Leslye Peer, MD;  Location: [facility] ENDOSCOPY;  Service: Pulmonary;;    CYSTECTOMY     cyst removed off top of head   IR IMAGING GUIDED PORT INSERTION  08/20/2021   MASTECTOMY     MASTECTOMY W/ SENTINEL NODE BIOPSY Right 06/27/2022   Procedure: RIGHT MASTECTOMY WITH SENTINEL LYMPH NODE BIOPSY;  Surgeon: Almond Lint, MD;  Location: [facility] OR;  Service: General;  Laterality: Right;   VIDEO BRONCHOSCOPY WITH RADIAL ENDOBRONCHIAL ULTRASOUND  07/30/2021   Procedure: VIDEO BRONCHOSCOPY WITH RADIAL ENDOBRONCHIAL ULTRASOUND;  Surgeon: Leslye Peer, MD;  Location: [facility] ENDOSCOPY;  Service: Pulmonary;;    Medication: Current Facility-Administered Medications  Medication Dose Route Frequency Provider Last Rate Last Admin   acetaminophen (TYLENOL) tablet 1,000 mg  1,000 mg Oral Q8H Mims, Lauren W, DO   1,000 mg at 08/08/22 1311   apixaban (ELIQUIS) tablet 10 mg  10 mg Oral BID Leatha Gilding, MD   10 mg at 08/08/22 1052   Followed by   Melene Muller ON 08/14/2022] apixaban (ELIQUIS) tablet 5 mg  5 mg Oral BID Pamella Pert M, MD       atenolol (TENORMIN) tablet 12.5 mg  12.5 mg Oral Daily John Giovanni, MD   12.5 mg at 08/08/22 1052   atorvastatin (LIPITOR) tablet 80 mg  80 mg Oral QHS John Giovanni, MD   80 mg at 08/07/22 2105   Chlorhexidine Gluconate Cloth 2 % PADS 6 each  6 each Topical Daily John Giovanni, MD   6 each at 08/08/22 1110   dexamethasone (DECADRON) tablet 4 mg  4 mg Oral Daily Leatha Gilding, MD   4 mg at 08/08/22 1057  And   dexamethasone (DECADRON) tablet 4 mg  4 mg Oral QAC supper Leatha Gilding, MD   4 mg at 08/07/22 1636   fentaNYL (DURAGESIC) 25 MCG/HR 1 patch  1 patch Transdermal Q72H Mims, Lauren W, DO   1 patch at 08/06/22 1629   HYDROmorphone (DILAUDID) injection 0.5 mg  0.5 mg Intravenous Q3H PRN Leatha Gilding, MD   0.5 mg at 08/08/22 1310   insulin aspart (novoLOG) injection 0-9 Units  0-9 Units Subcutaneous TID WC John Giovanni, MD   1 Units at 08/08/22 1156   levETIRAcetam (KEPPRA) tablet 500 mg  500 mg Oral BID  John Giovanni, MD   500 mg at 08/08/22 1052   lidocaine (LIDODERM) 5 % 2 patch  2 patch Transdermal Q24H Alena Bills, DO   2 patch at 08/08/22 1105   naloxone (NARCAN) injection 0.4 mg  0.4 mg Intravenous PRN John Giovanni, MD       oxyCODONE (Oxy IR/ROXICODONE) immediate release tablet 5-10 mg  5-10 mg Oral Q4H PRN Alena Bills, DO   10 mg at 08/08/22 1108   pantoprazole (PROTONIX) EC tablet 40 mg  40 mg Oral Daily Leatha Gilding, MD   40 mg at 08/08/22 1052   polyethylene glycol (MIRALAX / GLYCOLAX) packet 17 g  17 g Oral BID Mims, Lauren W, DO   17 g at 08/08/22 1052   QUEtiapine (SEROQUEL) tablet 12.5 mg  12.5 mg Oral BID Leatha Gilding, MD   12.5 mg at 08/08/22 1052   senna (SENOKOT) tablet 17.2 mg  2 tablet Oral BID Mims, Lauren W, DO   17.2 mg at 08/08/22 1052   sodium chloride flush (NS) 0.9 % injection 10-40 mL  10-40 mL Intracatheter PRN John Giovanni, MD   10 mL at 08/03/22 1117   Facility-Administered Medications Ordered in Other Encounters  Medication Dose Route Frequency Provider Last Rate Last Admin   acetaminophen (TYLENOL) 325 MG tablet            cyanocobalamin (VITAMIN B12) 1000 MCG/ML injection            diphenhydrAMINE (BENADRYL) 25 mg capsule            gadopiclenol (VUEWAY) 0.5 MMOL/ML solution 7.5 mL  7.5 mL Intravenous Once PRN Margaretmary Dys, MD        Allergies: No Known Allergies  Social History: Social History   Tobacco Use   Smoking status: Never   Smokeless tobacco: Never  Vaping Use   Vaping status: Never Used  Substance Use Topics   Alcohol use: No   Drug use: Never    Family History Family History  Problem Relation Age of Onset   Arthritis Mother    Breast cancer Sister 44   Diabetes Sister    Hyperlipidemia Sister    Hypertension Sister    Stroke Sister    Dementia Sister    Diabetes Brother    Hypertension Brother    Breast cancer Niece 91    Review of Systems  Unable to perform ROS: Dementia      Objective   Vital signs in last 24 hours: BP 132/80 (BP Location: Left Arm)   Pulse 72   Temp 98.1 F (36.7 C) (Oral)   Resp 20   Ht 5\' 8"  (1.727 m)   Wt 71.7 kg   SpO2 94%   BMI 24.02 kg/m   Physical Exam General: NAD, A&O, resting, appropriate HEENT: [place]/AT Pulmonary: Normal work of breathing  Cardiovascular: RRR, no cyanosis Neuro: confused, UTA  Most Recent Labs: Lab Results  Component Value Date   WBC 10.0 08/08/2022   HGB 14.4 08/08/2022   HCT 44.5 08/08/2022   PLT 491 (H) 08/08/2022    Lab Results  Component Value Date   NA 138 08/07/2022   K 3.8 08/07/2022   CL 103 08/07/2022   CO2 27 08/07/2022   BUN 17 08/07/2022   CREATININE 0.57 08/08/2022   CALCIUM 9.4 08/07/2022   MG 2.1 08/07/2022    Lab Results  Component Value Date   INR 1.2 08/04/2022   APTT 29 08/04/2022     Urine Culture: @[id number] (laburin,org,[id number])@   IMAGING: CT HEAD WO CONTRAST ( )  Result Date: 08/07/2022 CLINICAL DATA:  Mental status change with unknown cause. Metastatic lung cancer EXAM: CT HEAD WITHOUT CONTRAST TECHNIQUE: Contiguous axial images were obtained from the base of the skull through the vertex without intravenous contrast. RADIATION DOSE REDUCTION: This exam was performed according to the departmental dose-optimization program which includes automated exposure control, adjustment of the mA and/or kV according to patient size and/or use of iterative reconstruction technique. COMPARISON:  07/31/2022 FINDINGS: Brain: Edema in the right cerebral hemisphere centered around a posterior temporal mass is improved with essentially resolved midline shift. Edematous appearance to a lesser degree in the superior left frontal lobe is unchanged and without worrisome mass effect. No acute hemorrhage, acute infarct, hydrocephalus, or collection. Vascular: No hyperdense vessel or unexpected calcification. Skull: Normal. Negative for fracture or focal lesion. Sinuses/Orbits: No  acute finding. IMPRESSION: Brain metastases with cerebral edema most extensive in the right temporoparietal lobes. Edema and mass effect is improved from 07/31/2022. No new abnormality. Electronically Signed   By: Tiburcio Pea M.D.   On: 08/07/2022 09:47   CT ABDOMEN PELVIS WO CONTRAST  Result Date: 08/06/2022 CLINICAL DATA:  Concern for retroperitoneal bleed. EXAM: CT ABDOMEN AND PELVIS WITHOUT CONTRAST TECHNIQUE: Multidetector CT imaging of the abdomen and pelvis was performed following the standard protocol without IV contrast. RADIATION DOSE REDUCTION: This exam was performed according to the departmental dose-optimization program which includes automated exposure control, adjustment of the mA and/or kV according to patient size and/or use of iterative reconstruction technique. COMPARISON:  CT abdomen pelvis dated 07/31/2022. FINDINGS: Evaluation of this exam is limited due to respiratory motion as well as in the absence of intravenous contrast. Lower chest: Minimal left lung base atelectasis. The visualized lung bases are otherwise clear. There is 3 vessel coronary vascular calcification. No intra-abdominal free air or free fluid. Hepatobiliary: Several liver cysts. No biliary dilatation. The gallbladder is unremarkable. Pancreas: Unremarkable. No pancreatic ductal dilatation or surrounding inflammatory changes. Spleen: Normal in size without focal abnormality. Adrenals/Urinary Tract: The adrenal glands are unremarkable. There is a 2.4 x 2.4 cm nonobstructing staghorn calculus in the upper pole of the right kidney. There is parenchyma atrophy and cortical scarring of the upper pole of the right kidney. There is a 12 mm nonobstructing stone in the inferior pole of the left kidney. There is no hydronephrosis or obstructing calculi. Bilateral renal cysts poorly evaluated on this noncontrast CT. The visualized ureters and urinary bladder appear unremarkable. Stomach/Bowel: There is moderate stool throughout  the colon. There is no bowel obstruction or active inflammation. The appendix is normal. Vascular/Lymphatic: Mild aortoiliac atherosclerotic disease. The IVC is unremarkable. No portal venous gas. There is no adenopathy. Reproductive: Myomatous uterus. Other: Postsurgical changes of the right breast. There is a 15 x 4 cm fluid collection in  the subcutaneous soft tissues of the right anterior chest wall as seen on the prior CT, likely a seroma. No retroperitoneal or intraperitoneal bleed. Musculoskeletal: Osteopenia with degenerative changes of the spine. No acute osseous pathology. IMPRESSION: 1. No acute intra-abdominal or pelvic pathology. No retroperitoneal or intraperitoneal bleed. 2. Bilateral nonobstructing renal calculi. No hydronephrosis. 3. Right anterior chest wall seroma as seen on the prior CT. 4.  Aortic Atherosclerosis (ICD10-I70.0). Electronically Signed   By: Elgie Collard M.D.   On: 08/06/2022 21:33    ------  Assessment:  77 y.o. female with staghorn calculus   Recommendations: #Staghorn calculus Patient has been complaining of diffuse nondescript pain across the lower back.  Some complaints of CVA tenderness on assessment but, she would offer the same complaint when touched anywhere.  Once distracted by the blanket she requested, the same areas were palpated without report of discomfort.  CT A/P notes 2.4 x 2.4 staghorn calculus of right kidney.  This appears to be confined primarily to the upper pole and is not obstructing the collecting system.  This can be addressed on an outpatient basis when she is medically stable, most likely by percutaneous nephrolithotomy.  Renal function preserved  UC positive for Corynebacterium species on 7/8.  Multiple species noted on 7/14.  S/p 4 days of ceftriaxone, 1 day cefdinir, and 2d cefadroxil. May consider in/out cath sample prior to discharge.   Will not need to follow this admission. Please feel free to call with questions.   Case  and plan discussed with Dr. Sallee Lange, NP Pager: [phone]   Please contact the urology consult pager with any further questions/concerns.

## 2022-08-09 DIAGNOSIS — G8929 Other chronic pain: Secondary | ICD-10-CM | POA: Diagnosis not present

## 2022-08-09 DIAGNOSIS — R569 Unspecified convulsions: Secondary | ICD-10-CM | POA: Diagnosis not present

## 2022-08-09 DIAGNOSIS — I10 Essential (primary) hypertension: Secondary | ICD-10-CM | POA: Diagnosis not present

## 2022-08-09 DIAGNOSIS — Z87440 Personal history of urinary (tract) infections: Secondary | ICD-10-CM | POA: Diagnosis not present

## 2022-08-09 DIAGNOSIS — C7931 Secondary malignant neoplasm of brain: Secondary | ICD-10-CM | POA: Diagnosis not present

## 2022-08-09 DIAGNOSIS — Z87448 Personal history of other diseases of urinary system: Secondary | ICD-10-CM | POA: Diagnosis not present

## 2022-08-09 DIAGNOSIS — E785 Hyperlipidemia, unspecified: Secondary | ICD-10-CM | POA: Diagnosis not present

## 2022-08-09 DIAGNOSIS — Z7189 Other specified counseling: Secondary | ICD-10-CM | POA: Diagnosis not present

## 2022-08-09 DIAGNOSIS — M6250 Muscle wasting and atrophy, not elsewhere classified, unspecified site: Secondary | ICD-10-CM | POA: Diagnosis not present

## 2022-08-09 DIAGNOSIS — C3490 Malignant neoplasm of unspecified part of unspecified bronchus or lung: Secondary | ICD-10-CM | POA: Diagnosis not present

## 2022-08-09 DIAGNOSIS — M546 Pain in thoracic spine: Secondary | ICD-10-CM | POA: Diagnosis not present

## 2022-08-09 DIAGNOSIS — G936 Cerebral edema: Secondary | ICD-10-CM | POA: Diagnosis not present

## 2022-08-09 DIAGNOSIS — G9340 Encephalopathy, unspecified: Secondary | ICD-10-CM | POA: Diagnosis not present

## 2022-08-09 DIAGNOSIS — D649 Anemia, unspecified: Secondary | ICD-10-CM | POA: Diagnosis not present

## 2022-08-09 DIAGNOSIS — I2699 Other pulmonary embolism without acute cor pulmonale: Secondary | ICD-10-CM | POA: Diagnosis not present

## 2022-08-09 DIAGNOSIS — F32A Depression, unspecified: Secondary | ICD-10-CM | POA: Diagnosis not present

## 2022-08-09 DIAGNOSIS — R319 Hematuria, unspecified: Secondary | ICD-10-CM | POA: Diagnosis not present

## 2022-08-09 DIAGNOSIS — R52 Pain, unspecified: Secondary | ICD-10-CM | POA: Diagnosis not present

## 2022-08-09 DIAGNOSIS — J90 Pleural effusion, not elsewhere classified: Secondary | ICD-10-CM | POA: Diagnosis not present

## 2022-08-09 DIAGNOSIS — M159 Polyosteoarthritis, unspecified: Secondary | ICD-10-CM | POA: Diagnosis not present

## 2022-08-09 DIAGNOSIS — C50211 Malignant neoplasm of upper-inner quadrant of right female breast: Secondary | ICD-10-CM | POA: Diagnosis not present

## 2022-08-09 DIAGNOSIS — R2689 Other abnormalities of gait and mobility: Secondary | ICD-10-CM | POA: Diagnosis not present

## 2022-08-09 DIAGNOSIS — E119 Type 2 diabetes mellitus without complications: Secondary | ICD-10-CM | POA: Diagnosis not present

## 2022-08-09 DIAGNOSIS — R1311 Dysphagia, oral phase: Secondary | ICD-10-CM | POA: Diagnosis not present

## 2022-08-09 DIAGNOSIS — Z7901 Long term (current) use of anticoagulants: Secondary | ICD-10-CM | POA: Diagnosis not present

## 2022-08-09 DIAGNOSIS — R531 Weakness: Secondary | ICD-10-CM | POA: Diagnosis not present

## 2022-08-09 DIAGNOSIS — N390 Urinary tract infection, site not specified: Secondary | ICD-10-CM | POA: Diagnosis not present

## 2022-08-09 DIAGNOSIS — D75839 Thrombocytosis, unspecified: Secondary | ICD-10-CM | POA: Diagnosis not present

## 2022-08-09 DIAGNOSIS — Z7401 Bed confinement status: Secondary | ICD-10-CM | POA: Diagnosis not present

## 2022-08-09 DIAGNOSIS — M549 Dorsalgia, unspecified: Secondary | ICD-10-CM | POA: Diagnosis not present

## 2022-08-09 DIAGNOSIS — N200 Calculus of kidney: Secondary | ICD-10-CM | POA: Diagnosis not present

## 2022-08-09 DIAGNOSIS — M6281 Muscle weakness (generalized): Secondary | ICD-10-CM | POA: Diagnosis not present

## 2022-08-09 DIAGNOSIS — Z743 Need for continuous supervision: Secondary | ICD-10-CM | POA: Diagnosis not present

## 2022-08-09 DIAGNOSIS — R404 Transient alteration of awareness: Secondary | ICD-10-CM | POA: Diagnosis not present

## 2022-08-09 LAB — GLUCOSE, CAPILLARY
Glucose-Capillary: 139 mg/dL — ABNORMAL HIGH (ref 70–99)
Glucose-Capillary: 138 mg/dL — ABNORMAL HIGH (ref 70–99)
Glucose-Capillary: 141 mg/dL — ABNORMAL HIGH (ref 70–99)

## 2022-08-09 MED ORDER — OXYCODONE HCL 5 MG PO TABS: 5.0000 mg | ORAL_TABLET | ORAL | 0 refills | Status: DC | PRN

## 2022-08-09 MED ORDER — QUETIAPINE FUMARATE 25 MG PO TABS: 12.5000 mg | ORAL_TABLET | Freq: Two times a day (BID) | ORAL | Status: DC

## 2022-08-09 MED ORDER — DEXAMETHASONE 4 MG PO TABS: 4.0000 mg | ORAL_TABLET | Freq: Two times a day (BID) | ORAL | Status: DC

## 2022-08-09 MED ORDER — APIXABAN 5 MG PO TABS: 5.0000 mg | ORAL_TABLET | Freq: Two times a day (BID) | ORAL | Status: DC

## 2022-08-09 MED ORDER — LIDOCAINE 5 % EX PTCH: 2.0000 | MEDICATED_PATCH | CUTANEOUS | 0 refills | Status: DC

## 2022-08-09 MED ORDER — HEPARIN SOD (PORK) LOCK FLUSH 100 UNIT/ML IV SOLN
500.0000 [IU] | Freq: Once | INTRAVENOUS | Status: AC
  Administered 2022-08-09: 500 [IU] via INTRAVENOUS
  Filled 2022-08-09: qty 5

## 2022-08-09 MED ORDER — PANTOPRAZOLE SODIUM 40 MG PO TBEC: 40.0000 mg | DELAYED_RELEASE_TABLET | Freq: Every day | ORAL | Status: DC

## 2022-08-09 NOTE — Discharge Summary (Signed)
Physician Discharge Summary  Jennifer Cowan VOZ:[id number] DOB: 11/14/1945 DOA: 07/31/2022  PCP: [name], FNP  Admit date: 07/31/2022 Discharge date: 08/09/2022  Admitted From: home Disposition:  SNF  Recommendations for Outpatient Follow-up:  Follow up with PCP in 1-2 weeks Continue Eliquid 10 mg BID for 4 days, then on Sunday, 7/28, switch to 5 mg BID Follow up with oncology and neurooncology as an outpatient  Home Health: none Equipment/Devices: none  Discharge Condition: stable CODE STATUS: Full code Diet Orders (From admission, onward)     Start     Ordered   07/31/22 2328  Diet Carb Modified Fluid consistency: Thin; Room service appropriate? Yes  Diet effective now       Question Answer Comment  Diet-HS Snack? Nothing   Calorie Level Medium 1600-2000   Fluid consistency: Thin   Room service appropriate? Yes      07 /14/24 2332            HPI: Per admitting MD, Jennifer Cowan is a 77 y.o. 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  In the ED, patient was slightly tachycardic but afebrile.  Not hypotensive.  Labs showing no leukocytosis or anemia, potassium 3.4, creatinine 0.5, ammonia level <10, SARS-CoV-2 PCR negative.  UA showing negative nitrite, moderate leukocytes, and microscopy showing 6-10 RBCs, 11-20 WBCs, and no bacteria.  Chest x-ray showing small left pleural effusion.    Hospital Course / Discharge  diagnoses: Principal Problem:   UTI (urinary tract infection) Active Problems:   Metastasis to brain [facility])   History of breast cancer   Malignant neoplasm of unspecified part of unspecified bronchus or lung (HCC)   Type 2 diabetes mellitus (HCC)   Renal calculi   Microscopic hematuria   Acute encephalopathy   Weakness   Cerebral edema (HCC)   Pain   High risk medication use   Counseling and coordination of care   Palliative care encounter   Constipation   Principal problem UTI with hematuria, renal calculi - CT on admission with 2.2 cm 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 Urology was consulted and evaluated patient, recommending outpatient follow up. Several imaging studies with CT / [name] did not show any obstruction or hydronephrosis. She has been having back pain which is unlikely to be related to this calculus. Urine cultures have been negative.  Has been maintained on ceftriaxone, switched to oral equivalent and has completed a 5-day course. She has remained stable following the antibiotics.    Active problems  Acute pulmonary embolism - probably causing her back pain to a certain extent. Discussed with Dr Laurell Josephs and Dr Al Pimple, started on anticoagulation. She tolerated it well, no evidence of bleeding, and was transitioned to Eliquis. She needs high dose 10 mg BID up until Sunday, 7/28 when she needs to be transitioned to 5 mg BID Acute encephalopathy, in-hospital delirium, lung cancer with brain metastases, breast cancer, persistent pain- CT head showing stable extensive vasogenic edema and stable mild right to left midline shift measuring 5 mm.  No evidence of intracranial hemorrhage on  CT. Continue Keppra, Decadron. Has follow up with Oncology and neurooncology. She has a degree of confusion, mild, easily reorientable. She has been increasingly emotional at times. All this may be due to her brain radiation therapy. Her generalized pain is not controlled.  Generalized weakness - In the  setting of known cancer and UTI.  Also she was previously on steroids which were tapered off a week ago which could be contributing. 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  Patient had moderate pericardial effusion on echo done in February 2024 which had essentially resolved on repeat echo done in April.  CT showing trace pericardial effusion. Left pleural effusion  -Patient had tiny left pleural effusion on CT back in February 2024 and no thoracentesis was performed.  CT showing trace left pleural effusion.  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 Hyperlipidemia - Continue Lipitor. Hypertension - Blood pressure stable.  Continue atenolol. Diet controlled type 2 diabetes - A1c 5.9 in June 2023. 6.2 this admission and has been on steroids long-term.   Sepsis ruled out   Discharge Instructions   Allergies as of 08/09/2022   No Known Allergies      Medication List     STOP taking these medications    amLODipine 10 MG tablet Commonly known as: NORVASC   aspirin 325 MG tablet Commonly known as: EQ Aspirin   benazepril 10 MG tablet Commonly known as: LOTENSIN   gabapentin 100 MG capsule Commonly known as: NEURONTIN   meloxicam 7.5 MG tablet Commonly known as: MOBIC   simvastatin 40 MG tablet Commonly known as: ZOCOR   spironolactone 25 MG tablet Commonly known as: ALDACTONE   traMADol 50 MG tablet Commonly known as: ULTRAM       TAKE these medications    Accu-Chek Guide test strip Generic drug: glucose blood Use as directed 4 times a day   Accu-Chek Guide w/Device Kit Use as directed 4 times a day   Accu-Chek Softclix Lancets lancets Use to check blood sugar as directed 4 times a day   acetaminophen 500 MG tablet Commonly known as: TYLENOL Take 1,000 mg by mouth every 6 (six) hours as needed (for back pain).   apixaban 5 MG Tabs tablet Commonly known as: ELIQUIS Take 1  tablet (5 mg total) by mouth 2 (two) times daily. 10 mg twice daily for 4 days, then on 7/28 switch to 5 mg twice daily   atenolol 25 MG tablet Commonly known as: TENORMIN Take 0.5 tablets (12.5 mg total) by mouth daily.   atorvastatin 80 MG tablet Commonly known as: LIPITOR Take 1 tablet (80 mg total) by mouth daily. What changed: when to take this   blood glucose meter kit and supplies Kit Dispense based on patient and insurance preference. Use up to four times daily as directed.   dexamethasone 4 MG tablet Commonly known as: DECADRON Take 1 tablet (4 mg total) by mouth 2 (two) times daily.   ferrous sulfate 325 (65 FE) MG tablet Take 325 mg by mouth daily with breakfast.   ketoconazole 2 % cream Commonly known as: NIZORAL Apply 1 Application topically See admin instructions. Apply to both feet 2 times a day   levETIRAcetam 500 MG tablet Commonly known as: Keppra Take 1 tablet (500 mg total) by mouth 2 (two) times daily.   lidocaine 5 % Commonly known as: LIDODERM Place 2 patches onto the skin daily. Remove & Discard patch within  12 hours or as directed by MD Start taking on: August 10, 2022   lidocaine-prilocaine cream Commonly known as: EMLA Apply 1 Application topically as needed [facility] access).   ondansetron 8 MG tablet Commonly known as: Zofran Take 1 tablet (8 mg total) by mouth every 8 (eight) hours as needed for nausea or vomiting.   oxyCODONE 5 MG immediate release tablet Commonly known as: Oxy IR/ROXICODONE Take 1-2 tablets (5-10 mg total) by mouth every 4 (four) hours as needed for moderate pain.   pantoprazole 40 MG tablet Commonly known as: PROTONIX Take 1 tablet (40 mg total) by mouth daily. Start taking on: August 10, 2022   prochlorperazine 10 MG tablet Commonly known as: COMPAZINE Take 1 tablet (10 mg total) by mouth every 6 (six) hours as needed for nausea or vomiting.   QUEtiapine 25 MG tablet Commonly known as: SEROQUEL Take 0.5 tablets (12.5  mg total) by mouth 2 (two) times daily.   Vitamin D3 125 MCG (5000 UT) Caps Take 5,000 Units by mouth daily.       Consultations: Oncology Palliative care  Procedures/Studies:  CT HEAD WO CONTRAST ( )  Result Date: 08/07/2022 CLINICAL DATA:  Mental status change with unknown cause. Metastatic lung cancer EXAM: CT HEAD WITHOUT CONTRAST TECHNIQUE: Contiguous axial images were obtained from the base of the skull through the vertex without intravenous contrast. RADIATION DOSE REDUCTION: This exam was performed according to the departmental dose-optimization program which includes automated exposure control, adjustment of the mA and/or kV according to patient size and/or use of iterative reconstruction technique. COMPARISON:  07/31/2022 FINDINGS: Brain: Edema in the right cerebral hemisphere centered around a posterior temporal mass is improved with essentially resolved midline shift. Edematous appearance to a lesser degree in the superior left frontal lobe is unchanged and without worrisome mass effect. No acute hemorrhage, acute infarct, hydrocephalus, or collection. Vascular: No hyperdense vessel or unexpected calcification. Skull: Normal. Negative for fracture or focal lesion. Sinuses/Orbits: No acute finding. IMPRESSION: Brain metastases with cerebral edema most extensive in the right temporoparietal lobes. Edema and mass effect is improved from 07/31/2022. No new abnormality. Electronically Signed   By: Tiburcio Pea M.D.   On: 08/07/2022 09:47   CT ABDOMEN PELVIS WO CONTRAST  Result Date: 08/06/2022 CLINICAL DATA:  Concern for retroperitoneal bleed. EXAM: CT ABDOMEN AND PELVIS WITHOUT CONTRAST TECHNIQUE: Multidetector CT imaging of the abdomen and pelvis was performed following the standard protocol without IV contrast. RADIATION DOSE REDUCTION: This exam was performed according to the departmental dose-optimization program which includes automated exposure control, adjustment of the mA  and/or kV according to patient size and/or use of iterative reconstruction technique. COMPARISON:  CT abdomen pelvis dated 07/31/2022. FINDINGS: Evaluation of this exam is limited due to respiratory motion as well as in the absence of intravenous contrast. Lower chest: Minimal left lung base atelectasis. The visualized lung bases are otherwise clear. There is 3 vessel coronary vascular calcification. No intra-abdominal free air or free fluid. Hepatobiliary: Several liver cysts. No biliary dilatation. The gallbladder is unremarkable. Pancreas: Unremarkable. No pancreatic ductal dilatation or surrounding inflammatory changes. Spleen: Normal in size without focal abnormality. Adrenals/Urinary Tract: The adrenal glands are unremarkable. There is a 2.4 x 2.4 cm nonobstructing staghorn calculus in the upper pole of the right kidney. There is parenchyma atrophy and cortical scarring of the upper pole of the right kidney. There is a 12 mm nonobstructing stone in the inferior pole of the left kidney. There is no hydronephrosis or obstructing calculi.  Bilateral renal cysts poorly evaluated on this noncontrast CT. The visualized ureters and urinary bladder appear unremarkable. Stomach/Bowel: There is moderate stool throughout the colon. There is no bowel obstruction or active inflammation. The appendix is normal. Vascular/Lymphatic: Mild aortoiliac atherosclerotic disease. The IVC is unremarkable. No portal venous gas. There is no adenopathy. Reproductive: Myomatous uterus. Other: Postsurgical changes of the right breast. There is a 15 x 4 cm fluid collection in the subcutaneous soft tissues of the right anterior chest wall as seen on the prior CT, likely a seroma. No retroperitoneal or intraperitoneal bleed. Musculoskeletal: Osteopenia with degenerative changes of the spine. No acute osseous pathology. IMPRESSION: 1. No acute intra-abdominal or pelvic pathology. No retroperitoneal or intraperitoneal bleed. 2. Bilateral  nonobstructing renal calculi. No hydronephrosis. 3. Right anterior chest wall seroma as seen on the prior CT. 4.  Aortic Atherosclerosis (ICD10-I70.0). Electronically Signed   By: Elgie Collard M.D.   On: 08/06/2022 21:33   US RENAL  Result Date: 08/06/2022 CLINICAL DATA:  Flank pain. EXAM: RENAL / URINARY TRACT ULTRASOUND COMPLETE COMPARISON:  CT abdomen and pelvis 07/31/2022 FINDINGS: Examination was limited by respiratory motion, patient agitation, and overlying bowel gas. The kidneys are overall poorly evaluated by this ultrasound. Right Kidney: Renal measurements: 8.3 x 4.6 x 5.3 cm = volume: 105 mL. The upper pole staghorn calculus shown on CT is poorly demonstrated by this ultrasound. No gross hydronephrosis. Left Kidney: Renal measurements: 8.6 x 4.5 x 4.2 cm = volume: 86 mL. 1.1 cm shadowing echogenic focus in the interpolar kidney corresponding to a known stone. No gross hydronephrosis. Bladder: 9 x 7 x 15 mm cystic focus along the posterior bladder wall. Other: None. IMPRESSION: 1. Extremely limited evaluation of the kidneys. No gross hydronephrosis. 2. 1.1 cm left renal stone. Poor visualization of the known right upper pole renal staghorn calculus. 3. 15 mm cystic focus along the posterior bladder wall. Electronically Signed   By: Sebastian Ache M.D.   On: 08/06/2022 13:00   CT CHEST W CONTRAST  Result Date: 08/04/2022 CLINICAL DATA:  Breast carcinoma, assess treatment response EXAM: CT CHEST WITH CONTRAST TECHNIQUE: Multidetector CT imaging of the chest was performed during intravenous contrast administration. RADIATION DOSE REDUCTION: This exam was performed according to the departmental dose-optimization program which includes automated exposure control, adjustment of the mA and/or kV according to patient size and/or use of iterative reconstruction technique. CONTRAST:  75mL OMNIPAQUE IOHEXOL 300 MG/ML  SOLN COMPARISON:  CT done on 02/24/2022, chest radiograph done on 07/31/2022 FINDINGS:  Cardiovascular: There are intraluminal filling defects in pulmonary artery branches in right upper lobe, right middle lobe, right lower lobe, left upper lobe and left lower lobe. There is moderate thrombus burden. RV LV ratio is more than 1 suggesting right heart strain. There is minimal pericardial effusion. There is homogeneous enhancement in thoracic aorta. There are scattered calcifications in thoracic aorta and its major branches. Mediastinum/Nodes: No significant lymphadenopathy seen. There are low-density nodules in the thyroid measuring up to 9 mm in size in the left lobe. Lungs/Pleura: There is partial atelectasis in left upper lobe along the medial aspect with air bronchograms with no interval change. Small linear densities in the posterior left mid lung field has not changed. In image 87 of series 6, there is a 4 mm nodule in left upper lobe with no interval change. There is interval clearing of left pleural effusion. There are no new infiltrates in the lung fields. There is no pneumothorax. Upper Abdomen: There are  multiple smoothly marginated low-density lesions in the liver measuring up to 5.2 cm in diameter, possibly cysts. There is fat attenuation in the lateral midportion of left kidney with no interval change. Large right renal stones seen. Musculoskeletal: Degenerative changes are noted in cervical and thoracic spine. There are no focal lytic or sclerotic lesions. There is serous fluid correction in right breast, possibly postsurgical change. IMPRESSION: There are intraluminal filling defects in multiple pulmonary artery branches in both lungs. There is moderate thrombus burden. There is evidence of right ventricular strain. Atelectasis/scarring seen in the medial aspect of left lung has not changed significantly. There are no new infiltrates. There is no pleural effusion or pneumothorax. There are no definite imaging signs of metastatic disease. Other chronic findings in the upper abdomen as  described in the body of the report. Imaging finding of acute PE with moderate thrombus burden and right ventricular strain was relayed to patient's provider Dr. Elvera Lennox by telephone call. Electronically Signed   By: Ernie Avena M.D.   On: 08/04/2022 16:15   CT ABDOMEN PELVIS W WO CONTRAST  Result Date: 07/31/2022 CLINICAL DATA:  Microscopic hematuria, metastatic lung cancer and right breast cancer * Tracking Code: BO * EXAM: CT ABDOMEN AND PELVIS WITHOUT AND WITH CONTRAST TECHNIQUE: Multidetector CT imaging of the abdomen and pelvis was performed following the standard protocol before and following the bolus administration of intravenous contrast. RADIATION DOSE REDUCTION: This exam was performed according to the departmental dose-optimization program which includes automated exposure control, adjustment of the mA and/or kV according to patient size and/or use of iterative reconstruction technique. CONTRAST:  OMNIPAQUE IOHEXOL 300 MG/ML  SOLN COMPARISON:  PET-CT 05/05/2022 FINDINGS: Despite efforts by the technologist and patient, motion artifact is present on today's exam and could not be eliminated. This reduces exam sensitivity and specificity. Lower chest: Mild atelectasis posteriorly in the left lower lobe. Right breast collection with cutaneous thickening in the right medial breast, and stranding in the soft tissues of the right chest wall lateral to the collection. Left anterior descending and right coronary artery atherosclerosis. Mitral and aortic valve calcifications. Trace pericardial effusion. Trace left pleural effusion. Hepatobiliary: Simple caudate lobe cyst 5.2 by 4.6 cm. No further imaging workup of this lesion is indicated. Similar simple cysts in segment 3 of the liver. No further imaging workup of these lesions is indicated. Mildly contracted gallbladder. Pancreas: Unremarkable Spleen: Unremarkable Adrenals/Urinary Tract: Adrenal glands normal. Substantial scarring in the right  kidney upper pole along with a 2.2 cm staghorn calculus. Benign right kidney lower pole cyst 1.7 cm in diameter on image 26 series 19. No further imaging workup of this lesion is indicated. 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 1.0 cm left kidney lower pole stone on image 32 series 13. Urinary bladder unremarkable. No significant abnormal filling defect or abnormal enhancement along the urothelium. Stomach/Bowel: Unremarkable Vascular/Lymphatic: Atherosclerosis is present, including aortoiliac atherosclerotic disease. Reproductive: Multiple uterine fibroids are again observed. Other: No supplemental non-categorized findings. Musculoskeletal: Spurring along the femoral necks and femoral heads bilaterally. Lumbar spondylosis and degenerative disc disease with resulting bilateral foraminal impingement at L4-5. IMPRESSION: 1. Substantial scarring in the right kidney upper pole along with a 2.2 cm staghorn calculus. 2. 1.0 cm left kidney lower pole stone. 3. 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 4. Right breast collection with cutaneous thickening in the right medial breast, and  stranding in the soft tissues of the right chest wall lateral to the collection. 5. Trace pericardial effusion and trace left pleural effusion. 6. Aortic and coronary atherosclerosis. Mitral and aortic valve calcifications. 7. Multiple uterine fibroids. 8. Lumbar spondylosis and degenerative disc disease with resulting bilateral foraminal impingement at L4-5. Aortic Atherosclerosis (ICD10-I70.0). Electronically Signed   By: Gaylyn Rong M.D.   On: 07/31/2022 17:17   CT Head Wo Contrast  Result Date: 07/31/2022 CLINICAL DATA:  Altered mental status.  Metastatic lung carcinoma EXAM: CT HEAD WITHOUT CONTRAST TECHNIQUE: Contiguous axial images were obtained from the base of the skull through the vertex without intravenous contrast.  RADIATION DOSE REDUCTION: This exam was performed according to the departmental dose-optimization program which includes automated exposure control, adjustment of the mA and/or kV according to patient size and/or use of iterative reconstruction technique. COMPARISON:  CT on 06/11/2022 FINDINGS: Brain: No evidence of intracranial hemorrhage. Extensive vasogenic edema throughout the right temporal and parietal lobes and involving the right thalamus and basal ganglia shows no significant change. Mass effect on the right lateral and mild midline shift measuring 5 mm also shows no significant change. Mild chronic small vessel disease again noted. Vascular:  No hyperdense vessel or other acute findings. Skull: No evidence of fracture or other significant bone abnormality. Sinuses/Orbits:  No acute findings. Other: None. IMPRESSION: No significant change in extensive vasogenic edema throughout the right temporal and parietal lobes, and involving the right thalamus and basal ganglia. Mild right-to-left midline shift measuring 5 mm also shows no significant change. No evidence of intracranial hemorrhage. Electronically Signed   By: Danae Orleans M.D.   On: 07/31/2022 16:55   DG CHEST PORT 1 VIEW  Result Date: 07/31/2022 CLINICAL DATA:  Worsening altered mental status. History of metastatic lung cancer. EXAM: PORTABLE CHEST 1 VIEW COMPARISON:  July 30, 2021 FINDINGS: Injectable port terminates at the cavoatrial junction. Normal cardiac silhouette. Small left pleural effusion. Post treatment changes in the left upper lobe. The right lung is clear. IMPRESSION: 1. Small left pleural effusion. 2. Post treatment changes in the left upper lobe. Electronically Signed   By: Ted Mcalpine M.D.   On: 07/31/2022 14:38     Subjective: - no chest pain, shortness of breath, no abdominal pain, nausea or vomiting.   Discharge Exam: BP 110/75   Pulse 80   Temp 98.2 F (36.8 C) (Oral)   Resp 14   Ht 5\' 8"  (1.727 m)   Wt  71.7 kg   SpO2 95%   BMI 24.02 kg/m   General: Pt is alert, awake, not in acute distress Cardiovascular: RRR, S1/S2 +, no rubs, no gallops Respiratory: CTA bilaterally, no wheezing, no rhonchi Abdominal: Soft, NT, ND, bowel sounds + Extremities: no edema, no cyanosis  The results of significant diagnostics from this hospitalization (including imaging, microbiology, ancillary and laboratory) are listed below for reference.     Microbiology: Recent Results (from the past 240 hour(s))  SARS Coronavirus 2 by RT PCR (hospital order, performed in [facility] hospital lab) *cepheid single result test* Anterior Nasal Swab     Status: None   Collection Time: 07/31/22  1:56 PM   Specimen: Anterior Nasal Swab  Result Value Ref Range Status   SARS Coronavirus 2 by RT PCR NEGATIVE NEGATIVE Final    Comment: (NOTE) SARS-CoV-2 target nucleic acids are NOT DETECTED.  The SARS-CoV-2 RNA is generally detectable in upper and lower respiratory specimens during the acute phase of infection. The lowest concentration  of SARS-CoV-2 viral copies this assay can detect is 250 copies / mL. A negative result does not preclude SARS-CoV-2 infection and should not be used as the sole basis for treatment or other patient management decisions.  A negative result may occur with improper specimen collection / handling, submission of specimen other than nasopharyngeal swab, presence of viral mutation(s) within the areas targeted by this assay, and inadequate number of viral copies (<250 copies / mL). A negative result must be combined with clinical observations, patient history, and epidemiological information.  Fact Sheet for Patients:   RoadLapTop.co.za  Fact Sheet for Healthcare Providers: http://kim-miller.com/  This test is not yet approved or  cleared by the [place] FDA and has been authorized for detection and/or diagnosis of SARS-CoV-2 by FDA under an  Emergency Use Authorization (EUA).  This EUA will remain in effect (meaning this test can be used) for the duration of the COVID-19 declaration under Section 564(b)(1) of the Act, 21 U.S.C. section 360bbb-3(b)(1), unless the authorization is terminated or revoked sooner.  Performed at [facility], [address], [place], [place] [address]   Urine Culture (for pregnant, neutropenic or urologic patients or patients with an indwelling urinary catheter)     Status: Abnormal   Collection Time: 07/31/22  2:26 PM   Specimen: Urine, Clean Catch  Result Value Ref Range Status   Specimen Description   Final    URINE, CLEAN CATCH Performed at [facility], [address] [place], [place] [address]    Special Requests   Final    NONE Performed at [facility] [facility], [address] [place], [place] [address]    Culture MULTIPLE SPECIES PRESENT, SUGGEST RECOLLECTION (A)  Final   Report Status 08/02/2022 FINAL  Final  Culture, blood (Routine X 2) w Reflex to ID Panel     Status: None   Collection Time: 08/01/22 12:26 AM   Specimen: BLOOD LEFT ARM  Result Value Ref Range Status   Specimen Description   Final    BLOOD LEFT ARM BOTTLES DRAWN AEROBIC AND ANAEROBIC Performed at [facility], [address] [place], [place] [address]    Special Requests   Final    Blood Culture adequate volume Performed at [facility], [address], [place], [place] [address]    Culture   Final    NO GROWTH 5 DAYS Performed at [facility], [address] [place], [place] [address]    Report Status 08/06/2022 FINAL  Final  Culture, blood (Routine X 2) w Reflex to ID Panel     Status: None   Collection Time: 08/01/22 12:32 AM   Specimen: BLOOD LEFT HAND  Result Value Ref Range Status   Specimen Description   Final    BLOOD LEFT HAND BOTTLES DRAWN AEROBIC ONLY Performed at [facility],  [address] [place], [place] [address]    Special Requests   Final    Blood Culture adequate volume Performed at [facility], [address], [place], [place] [address]    Culture   Final    NO GROWTH 5 DAYS Performed at [facility], [address], [place], [place] [address]    Report Status 08/06/2022 FINAL  Final     Labs: Basic Metabolic Panel: Recent Labs  Lab 08/03/22 0555 08/05/22 0435 08/06/22 0432 08/07/22 0515 08/08/22 0556  NA 139 136 137 138  --  K 3.6 3.9 4.1 3.8  --   CL 108 105 104 103  --   CO2 23 25 28 27   --   GLUCOSE 128* 147* 161* 156*  --   BUN 11 17 15 17   --   CREATININE 0.50 0.78 0.58 0.61 0.57  CALCIUM 8.7* 9.2 9.1 9.4  --   MG  --  2.2 2.2 2.1  --    Liver Function Tests: Recent Labs  Lab 08/07/22 0515  AST 33  ALT 68*  ALKPHOS 56  BILITOT 0.5  PROT 5.9*  ALBUMIN 3.1*   CBC: Recent Labs  Lab 08/03/22 0555 08/05/22 0435 08/06/22 0432 08/07/22 0515 08/08/22 0556  WBC 5.3 10.2 8.7 9.3 10.0  HGB 11.6* 12.5 12.3 13.4 14.4  HCT 36.2 39.4 38.6 42.0 44.5  MCV 98.9 98.5 98.7 98.1 96.9  PLT 430* 539* 461* 524* 491*   CBG: Recent Labs  Lab 08/08/22 0733 08/08/22 1116 08/08/22 1652 08/09/22 0734 08/09/22 1134  GLUCAP 108* 143* 152* 139* 138*   Hgb A1c No results for input(s): "HGBA1C" in the last 72 hours. Lipid Profile No results for input(s): "CHOL", "HDL", "LDLCALC", "TRIG", "CHOLHDL", "LDLDIRECT" in the last 72 hours. Thyroid function studies No results for input(s): "TSH", "T4TOTAL", "T3FREE", "THYROIDAB" in the last 72 hours.  Invalid input(s): "FREET3" Urinalysis    Component Value Date/Time   COLORURINE YELLOW 07/31/2022 1426   APPEARANCEUR HAZY (A) 07/31/2022 1426   LABSPEC 1.016 07/31/2022 1426   PHURINE 5.0 07/31/2022 1426   GLUCOSEU NEGATIVE 07/31/2022 1426   HGBUR SMALL (A) 07/31/2022 1426   BILIRUBINUR NEGATIVE 07/31/2022 1426   BILIRUBINUR small (A) 07/24/2021 1047    KETONESUR 5 (A) 07/31/2022 1426   PROTEINUR NEGATIVE 07/31/2022 1426   UROBILINOGEN 1.0 07/24/2021 1047   NITRITE NEGATIVE 07/31/2022 1426   LEUKOCYTESUR MODERATE (A) 07/31/2022 1426    FURTHER DISCHARGE INSTRUCTIONS:   Get Medicines reviewed and adjusted: Please take all your medications with you for your next visit with your Primary MD   Laboratory/radiological data: Please request your Primary MD to go over all hospital tests and procedure/radiological results at the follow up, please ask your Primary MD to get all Hospital records sent to his/her office.   In some cases, they will be blood work, cultures and biopsy results pending at the time of your discharge. Please request that your primary care M.D. goes through all the records of your hospital data and follows up on these results.   Also Note the following: If you experience worsening of your admission symptoms, develop shortness of breath, life threatening emergency, suicidal or homicidal thoughts you must seek medical attention immediately by calling 911 or calling your MD immediately  if symptoms less severe.   You must read complete instructions/literature along with all the possible adverse reactions/side effects for all the Medicines you take and that have been prescribed to you. Take any new Medicines after you have completely understood and accpet all the possible adverse reactions/side effects.    Do not drive when taking Pain medications or sleeping medications (Benzodaizepines)   Do not take more than prescribed Pain, Sleep and Anxiety Medications. It is not advisable to combine anxiety,sleep and pain medications without talking with your primary care practitioner   Special Instructions: If you have smoked or chewed Tobacco  in the last 2 yrs please stop smoking, stop any regular Alcohol  and or any Recreational drug use.   Wear Seat belts while driving.   Please note:  You were cared for by a hospitalist during your  hospital stay. Once you are discharged, your primary care physician will handle any further medical issues. Please note that NO REFILLS for any discharge medications will be authorized once you are discharged, as it is imperative that you return to your primary care physician (or establish a relationship with a primary care physician if you do not have one) for your post hospital discharge needs so that they can reassess your need for medications and monitor your lab values.  Time coordinating discharge: 35 minutes  SIGNED:  Pamella Pert, MD, PhD 08/09/2022, 12:43 PM

## 2022-08-09 NOTE — Addendum Note (Signed)
Encounter addended by: Jacinta Shoe, RT on: 08/09/2022 11:13 AM  Actions taken: Imaging Exam ended

## 2022-08-09 NOTE — Care Plan (Signed)
AVS printed, report called to [facility]. Port deaccessed, IV removed. Pt ready for transfer.

## 2022-08-09 NOTE — TOC Transition Note (Signed)
Transition of Care [facility]) - CM/SW Discharge Note   Patient Details  Name: Jennifer Cowan MRN: [id number] Date of Birth: 1946-01-05  [facility] CM/SW Contact:  Beckie Busing, RN Phone [phone]  08/09/2022, 1:35 PM   Clinical Narrative:    Patient is discharging  to [facility]. Niece has been updated. Transportation has been arranged per PTAR. Nurse has been updated on discharge.   Please call report to [facility] [phone] Room # 1206   Final next level of care: Skilled Nursing Facility Barriers to Discharge: No Barriers Identified   Patient Goals and CMS Choice CMS Medicare.gov Compare Post Acute Care list provided to:: Patient Represenative (must comment) (niece) Choice offered to / list presented to : Patient (niece who is caregiver)  Discharge Placement                Patient chooses bed at: [facility] Patient to be transferred to facility by: PTAR Name of family member notified: Roque Lias Patient and family notified of of transfer: 08/09/22  Discharge Plan and Services Additional resources added to the After Visit Summary for   In-house Referral: NA Discharge Planning Services: CM Consult Post Acute Care Choice: Skilled Nursing Facility          DME Arranged: N/A DME Agency: NA       HH Arranged: NA HH Agency: NA        Social Determinants of Health (SDOH) Interventions SDOH Screenings   Food Insecurity: No Food Insecurity (06/27/2022)  Housing: Low Risk  (06/27/2022)  Transportation Needs: No Transportation Needs (06/27/2022)  Utilities: Not At Risk (06/27/2022)  Depression (PHQ2-9): High Risk (07/27/2022)  Tobacco Use: Low Risk  (07/29/2022)  Health Literacy: Low Risk  (02/06/2020)   Received from [facility]     Readmission Risk Interventions    08/05/2022   11:44 AM  Readmission Risk Prevention Plan  Transportation Screening Complete  PCP or Specialist Appt within 5-7 Days Complete  Home Care  Screening Complete  Medication Review (RN CM) Referral to Pharmacy

## 2022-08-09 NOTE — TOC Progression Note (Addendum)
Transition of Care [facility]) - Progression Note    Patient Details  Name: HERMA UBALLE MRN: [id number] Date of Birth: Apr 08, 1945  Transition of Care [facility]) CM/SW Contact  Beckie Busing, RN Phone [phone]  08/09/2022, 10:09 AM  Clinical Narrative:    CM spoke with [facility] admissions director for [facility]. Currently there are no beds available. CM has reached out to niece for another facility choice. Niece requesting [facility]. CM has left message for [facility] director for [facility]. Awaiting confirmation of bed availability.   1230 Per [facility] can accept patient today. Insurance Berkley Harvey has been approved 7/19-7/24 Auth ID [id number]. MD has been updated.   Expected Discharge Plan: Skilled Nursing Facility Barriers to Discharge: Continued Medical Work up  Expected Discharge Plan and Services In-house Referral: NA Discharge Planning Services: CM Consult Post Acute Care Choice: Skilled Nursing Facility Living arrangements for the past 2 months: Single Family Home                 DME Arranged: N/A DME Agency: NA       HH Arranged: NA HH Agency: NA         Social Determinants of Health (SDOH) Interventions SDOH Screenings   Food Insecurity: No Food Insecurity (06/27/2022)  Housing: Low Risk  (06/27/2022)  Transportation Needs: No Transportation Needs (06/27/2022)  Utilities: Not At Risk (06/27/2022)  Depression (PHQ2-9): High Risk (07/27/2022)  Tobacco Use: Low Risk  (07/29/2022)  Health Literacy: Low Risk  (02/06/2020)   Received from [facility], [facility]    Readmission Risk Interventions    08/05/2022   11:44 AM  Readmission Risk Prevention Plan  Transportation Screening Complete  PCP or Specialist Appt within 5-7 Days Complete  Home Care Screening Complete  Medication Review (RN CM) Referral to Pharmacy

## 2022-08-09 NOTE — Progress Notes (Signed)
Physical Therapy Treatment Patient Details Name: Jennifer Cowan MRN: [id number] DOB: Aug 13, 1945 Today's Date: 08/09/2022   History of Present Illness 77 yo female presents to therapy following hospital admission on 07/31/2022 due to AMS, poor PO intake and generalized weakness. Pt was found to have UTI and acute encephalopathy. CT of head 07/31/2022 indicates no evidence of intracranial hemorrhage, no significant change in extensive vasogenic edema throughout the right temporal and parietal lobes, and involving the right thalamus and basal ganglia. CT of abdomen and pelvis 7/14 reveals spurring along the femoral necks and femoral heads bilaterally, lumbar spondylosis and degenerative disc disease with resulting bilateral foraminal impingement at L4-5 as well as kidney stone. CXR shows small L pleural effusion. 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    PT Comments  General Comments: AxO x 1 mostly confused, tends to repeat "MaMa".  Requiring MAX VC's and tactile cueing.  Unable to sustain eye contact. Unable to functionally use hands (hold a cup). Pt was OOB in recliner.  Eyes shut.  Untouched Lunch tray in front.   Assisted with amb was difficult.  General transfer comment: pt required + 2 side by side Max/Total Asisst to rise due to impaired cognition and grimacing pain.  Also assisted with a toilet transfer + 2 assist. General Gait Details: started with walker however pt cognitively unable to use so then assisted + 2 side by side hand held assist with tactile cueing to "push" pt forward to initiate steps.  Assisted with amb to and from bathroom was difficult. Returned to recliner and positioned to comfort.   Pt will need ST Rehab at SNF to address mobility and functional decline prior to safely returning home.     Assistance Recommended at  Discharge Frequent or constant Supervision/Assistance  If plan is discharge home, recommend the following:  Can travel by private vehicle    Two people to help with walking and/or transfers;A lot of help with bathing/dressing/bathroom;Assistance with cooking/housework;Direct supervision/assist for medications management;Direct supervision/assist for financial management;Assist for transportation;Help with stairs or ramp for entrance   No  Equipment Recommendations  None recommended by PT    Recommendations for Other Services       Precautions / Restrictions Precautions Precautions: Fall Precaution Comments: AMS Brain METS Restrictions Weight Bearing Restrictions: No     Mobility  Bed Mobility               General bed mobility comments: OOB in recliner    Transfers Overall transfer level: Needs assistance Equipment used: Rolling walker (2 wheels), None Transfers: Sit to/from Stand Sit to Stand: Max assist, Total assist           General transfer comment: pt required + 2 side by side Max/Total Asisst to rise due to impaired cognition and grimacing pain.  Also assisted with a toilet transfer + 2 assist.    Ambulation/Gait Ambulation/Gait assistance: Max assist, +2 physical assistance, +2 safety/equipment Gait Distance (Feet): 20 Feet (10 feet x 2 to and from bathroom) Assistive device: Rolling walker (2 wheels), None Gait Pattern/deviations: Step-to pattern, Shuffle, Narrow base of support Gait velocity: decreased     General Gait Details: started with walker however pt cognitively unable to use so then assisted + 2 side by side hand held assist with tactile cueing to "push" pt forward to initiate steps.  Assisted  with amb to and from bathroom was difficult.   Stairs             Wheelchair Mobility     Tilt Bed    Modified Rankin (Stroke Patients Only)       Balance                                            Cognition  Arousal/Alertness: Awake/alert, Lethargic   Overall Cognitive Status: No family/caregiver present to determine baseline cognitive functioning                                 General Comments: AxO x 1 mostly confused, tends to repeat "MaMa".  Requiring MAX VC's and tactile cueing.  Unable to sustain eye contact. Unable to functionally use hands (hold a cup).        Exercises      General Comments        Pertinent Vitals/Pain Pain Assessment Pain Assessment: Faces Faces Pain Scale: Hurts even more Pain Location: unable to cognitively express location Pain Descriptors / Indicators: Constant, Discomfort, Guarding, Grimacing, Moaning, Restless Pain Intervention(s): Monitored during session, Repositioned    Home Living                          Prior Function            PT Goals (current goals can now be found in the care plan section) Progress towards PT goals: Progressing toward goals    Frequency    Min 1X/week      PT Plan Current plan remains appropriate    Co-evaluation              AM-PAC PT "6 Clicks" Mobility   Outcome Measure  Help needed turning from your back to your side while in a flat bed without using bedrails?: A Lot Help needed moving from lying on your back to sitting on the side of a flat bed without using bedrails?: A Lot Help needed moving to and from a bed to a chair (including a wheelchair)?: A Lot Help needed standing up from a chair using your arms (e.g., wheelchair or bedside chair)?: A Lot Help needed to walk in hospital room?: A Lot Help needed climbing 3-5 steps with a railing? : Total 6 Click Score: 11    End of Session Equipment Utilized During Treatment: Gait belt Activity Tolerance: Other (comment);Patient limited by pain (cognition) Patient left: in chair;with call bell/phone within reach;with chair alarm set Nurse Communication: Mobility status PT Visit Diagnosis: Unsteadiness on feet  (R26.81);Difficulty in walking, not elsewhere classified (R26.2);Adult, failure to thrive (R62.7);Pain     Time: 0102-7253 PT Time Calculation (min) (ACUTE ONLY): 25 min  Charges:    $Gait Training: 8-22 mins $Therapeutic Activity: 8-22 mins PT General Charges $$ ACUTE PT VISIT: 1 Visit                     Felecia Shelling  PTA Acute  Rehabilitation Services Office M-F          [phone]

## 2022-08-10 ENCOUNTER — Inpatient Hospital Stay: Payer: 59 | Admitting: Nurse Practitioner

## 2022-08-10 ENCOUNTER — Other Ambulatory Visit: Payer: Self-pay | Admitting: *Deleted

## 2022-08-10 NOTE — Consult Note (Signed)
   [facility] Inpatient Consult   08/10/2022  ASHWINI JAGO 1945/10/11 [id number]  Follow up: Transitioned to SNF  [facility] [[facility]]  Accountable Care Organization [ACO] Patient: [employer] Medicare [Dual]  [facility] Liaison remote coverage review for patient admitted to [facility]  07/31/22 to 08/09/22     Primary Care Provider:  [name], FNP with [facility]  Patient was reviewed for high risk score 9 day length of stay barriers to care for transition.  Patient was screened for hospitalization and on behalf of [facility] Care Coordination to assess for post hospital community care needs.  Patient went [facility]  skilled nursing facility level of care for post hospital transition 08/09/22.  Marland KitchenPlan:   Will notify the Community [facility] RN who can follow for any known or needs for transitional care needs for returning to post facility care coordination needs to return to community.   For questions or referrals, please contact:  Charlesetta Shanks, RN BSN CCM [facility]  [phone] business mobile phone Toll free office [phone]  *Concierge Line  [phone] Fax number: [phone] [email or web address]

## 2022-08-10 NOTE — Patient Outreach (Signed)
Per [facility] Ms. Berhe resides in [place] [facility] skilled nursing facility. Recently admitted to [facility]. Screening for potential care coordination services as benefit of health plan and Primary Care Provider.   Will follow for transition plans and potential care coordination needs.   Raiford Noble, MSN, RN,BSN [facility] Post Acute Care Coordinator [phone] (Direct dial)

## 2022-08-11 ENCOUNTER — Telehealth: Payer: Self-pay | Admitting: Adult Health

## 2022-08-11 DIAGNOSIS — G9340 Encephalopathy, unspecified: Secondary | ICD-10-CM | POA: Diagnosis not present

## 2022-08-11 DIAGNOSIS — C3490 Malignant neoplasm of unspecified part of unspecified bronchus or lung: Secondary | ICD-10-CM | POA: Diagnosis not present

## 2022-08-11 DIAGNOSIS — R52 Pain, unspecified: Secondary | ICD-10-CM | POA: Diagnosis not present

## 2022-08-11 DIAGNOSIS — I2699 Other pulmonary embolism without acute cor pulmonale: Secondary | ICD-10-CM | POA: Diagnosis not present

## 2022-08-11 DIAGNOSIS — Z87440 Personal history of urinary (tract) infections: Secondary | ICD-10-CM | POA: Diagnosis not present

## 2022-08-11 DIAGNOSIS — R569 Unspecified convulsions: Secondary | ICD-10-CM | POA: Diagnosis not present

## 2022-08-11 DIAGNOSIS — G936 Cerebral edema: Secondary | ICD-10-CM | POA: Diagnosis not present

## 2022-08-11 DIAGNOSIS — J90 Pleural effusion, not elsewhere classified: Secondary | ICD-10-CM | POA: Diagnosis not present

## 2022-08-11 DIAGNOSIS — M159 Polyosteoarthritis, unspecified: Secondary | ICD-10-CM | POA: Diagnosis not present

## 2022-08-11 DIAGNOSIS — M546 Pain in thoracic spine: Secondary | ICD-10-CM | POA: Diagnosis not present

## 2022-08-11 DIAGNOSIS — E785 Hyperlipidemia, unspecified: Secondary | ICD-10-CM | POA: Diagnosis not present

## 2022-08-11 DIAGNOSIS — N200 Calculus of kidney: Secondary | ICD-10-CM | POA: Diagnosis not present

## 2022-08-11 DIAGNOSIS — I10 Essential (primary) hypertension: Secondary | ICD-10-CM | POA: Diagnosis not present

## 2022-08-11 DIAGNOSIS — D649 Anemia, unspecified: Secondary | ICD-10-CM | POA: Diagnosis not present

## 2022-08-11 DIAGNOSIS — C50211 Malignant neoplasm of upper-inner quadrant of right female breast: Secondary | ICD-10-CM | POA: Diagnosis not present

## 2022-08-11 DIAGNOSIS — E119 Type 2 diabetes mellitus without complications: Secondary | ICD-10-CM | POA: Diagnosis not present

## 2022-08-11 DIAGNOSIS — Z87448 Personal history of other diseases of urinary system: Secondary | ICD-10-CM | POA: Diagnosis not present

## 2022-08-11 DIAGNOSIS — C7931 Secondary malignant neoplasm of brain: Secondary | ICD-10-CM | POA: Diagnosis not present

## 2022-08-11 DIAGNOSIS — Z7901 Long term (current) use of anticoagulants: Secondary | ICD-10-CM | POA: Diagnosis not present

## 2022-08-11 DIAGNOSIS — R531 Weakness: Secondary | ICD-10-CM | POA: Diagnosis not present

## 2022-08-11 NOTE — Telephone Encounter (Signed)
Called to schedule a hospital follow up per 7/24 secure chat. Talked with the patients niece and she states that the patient is in rehab and as of right now, she is not sure how long the patient will be there. Lillard Anes and her nurse Stevan Born has been notified via 7/25 secure chat

## 2022-08-12 ENCOUNTER — Other Ambulatory Visit: Payer: Self-pay | Admitting: Radiation Therapy

## 2022-08-12 DIAGNOSIS — C7931 Secondary malignant neoplasm of brain: Secondary | ICD-10-CM | POA: Diagnosis not present

## 2022-08-12 DIAGNOSIS — E119 Type 2 diabetes mellitus without complications: Secondary | ICD-10-CM | POA: Diagnosis not present

## 2022-08-12 DIAGNOSIS — F32A Depression, unspecified: Secondary | ICD-10-CM | POA: Diagnosis not present

## 2022-08-12 DIAGNOSIS — G936 Cerebral edema: Secondary | ICD-10-CM | POA: Diagnosis not present

## 2022-08-12 DIAGNOSIS — G8929 Other chronic pain: Secondary | ICD-10-CM | POA: Diagnosis not present

## 2022-08-12 DIAGNOSIS — C3490 Malignant neoplasm of unspecified part of unspecified bronchus or lung: Secondary | ICD-10-CM | POA: Diagnosis not present

## 2022-08-12 DIAGNOSIS — D75839 Thrombocytosis, unspecified: Secondary | ICD-10-CM | POA: Diagnosis not present

## 2022-08-12 DIAGNOSIS — C50211 Malignant neoplasm of upper-inner quadrant of right female breast: Secondary | ICD-10-CM | POA: Diagnosis not present

## 2022-08-12 DIAGNOSIS — Z7901 Long term (current) use of anticoagulants: Secondary | ICD-10-CM | POA: Diagnosis not present

## 2022-08-12 DIAGNOSIS — Z7189 Other specified counseling: Secondary | ICD-10-CM | POA: Diagnosis not present

## 2022-08-12 DIAGNOSIS — N200 Calculus of kidney: Secondary | ICD-10-CM | POA: Diagnosis not present

## 2022-08-12 DIAGNOSIS — M549 Dorsalgia, unspecified: Secondary | ICD-10-CM | POA: Diagnosis not present

## 2022-08-12 DIAGNOSIS — I2699 Other pulmonary embolism without acute cor pulmonale: Secondary | ICD-10-CM | POA: Diagnosis not present

## 2022-08-15 DIAGNOSIS — C50211 Malignant neoplasm of upper-inner quadrant of right female breast: Secondary | ICD-10-CM | POA: Diagnosis not present

## 2022-08-15 DIAGNOSIS — M159 Polyosteoarthritis, unspecified: Secondary | ICD-10-CM | POA: Diagnosis not present

## 2022-08-15 DIAGNOSIS — R52 Pain, unspecified: Secondary | ICD-10-CM | POA: Diagnosis not present

## 2022-08-15 DIAGNOSIS — R531 Weakness: Secondary | ICD-10-CM | POA: Diagnosis not present

## 2022-08-16 DIAGNOSIS — F32A Depression, unspecified: Secondary | ICD-10-CM | POA: Diagnosis not present

## 2022-08-16 DIAGNOSIS — E119 Type 2 diabetes mellitus without complications: Secondary | ICD-10-CM | POA: Diagnosis not present

## 2022-08-16 DIAGNOSIS — G8929 Other chronic pain: Secondary | ICD-10-CM | POA: Diagnosis not present

## 2022-08-16 DIAGNOSIS — I10 Essential (primary) hypertension: Secondary | ICD-10-CM | POA: Diagnosis not present

## 2022-08-16 DIAGNOSIS — C50211 Malignant neoplasm of upper-inner quadrant of right female breast: Secondary | ICD-10-CM | POA: Diagnosis not present

## 2022-08-17 DIAGNOSIS — M159 Polyosteoarthritis, unspecified: Secondary | ICD-10-CM | POA: Diagnosis not present

## 2022-08-17 DIAGNOSIS — C50211 Malignant neoplasm of upper-inner quadrant of right female breast: Secondary | ICD-10-CM | POA: Diagnosis not present

## 2022-08-17 DIAGNOSIS — R52 Pain, unspecified: Secondary | ICD-10-CM | POA: Diagnosis not present

## 2022-08-17 DIAGNOSIS — R531 Weakness: Secondary | ICD-10-CM | POA: Diagnosis not present

## 2022-08-18 ENCOUNTER — Encounter: Payer: Self-pay | Admitting: Hematology and Oncology

## 2022-08-22 ENCOUNTER — Telehealth: Payer: Self-pay | Admitting: Adult Health

## 2022-08-22 DIAGNOSIS — C50211 Malignant neoplasm of upper-inner quadrant of right female breast: Secondary | ICD-10-CM | POA: Diagnosis not present

## 2022-08-22 DIAGNOSIS — R52 Pain, unspecified: Secondary | ICD-10-CM | POA: Diagnosis not present

## 2022-08-22 DIAGNOSIS — R531 Weakness: Secondary | ICD-10-CM | POA: Diagnosis not present

## 2022-08-22 DIAGNOSIS — M159 Polyosteoarthritis, unspecified: Secondary | ICD-10-CM | POA: Diagnosis not present

## 2022-08-22 NOTE — Telephone Encounter (Signed)
Patient's niece is aware of scheduled appointment times/dates for patient

## 2022-08-25 ENCOUNTER — Inpatient Hospital Stay: Admission: RE | Admit: 2022-08-25 | Payer: 59 | Source: Ambulatory Visit

## 2022-08-25 ENCOUNTER — Telehealth: Payer: Self-pay | Admitting: Internal Medicine

## 2022-08-25 ENCOUNTER — Ambulatory Visit: Payer: Medicaid Other | Admitting: Family Medicine

## 2022-08-25 NOTE — Telephone Encounter (Signed)
Called to reschedule 08/15 appointment, left a voicemail.

## 2022-08-26 ENCOUNTER — Other Ambulatory Visit: Payer: Self-pay | Admitting: *Deleted

## 2022-08-26 DIAGNOSIS — C50211 Malignant neoplasm of upper-inner quadrant of right female breast: Secondary | ICD-10-CM | POA: Diagnosis not present

## 2022-08-26 DIAGNOSIS — M159 Polyosteoarthritis, unspecified: Secondary | ICD-10-CM | POA: Diagnosis not present

## 2022-08-26 DIAGNOSIS — R52 Pain, unspecified: Secondary | ICD-10-CM | POA: Diagnosis not present

## 2022-08-26 DIAGNOSIS — R531 Weakness: Secondary | ICD-10-CM | POA: Diagnosis not present

## 2022-08-26 NOTE — Patient Outreach (Signed)
Later entry for 08/25/22  Ms. Holzer resides in [facility] skilled nursing facility. Screening for potential care coordination services as benefit of health plan and Primary Care Provider.   Collaboration with Paramedic. Ms. Rundlett lived with sister prior to admission. Anticipated transition plan is for long term care.  Will continue to follow.  Raiford Noble, MSN, RN,BSN Post Acute Care Coordinator [phone] (Direct dial)

## 2022-08-27 ENCOUNTER — Telehealth: Payer: Self-pay | Admitting: Internal Medicine

## 2022-08-27 NOTE — Telephone Encounter (Signed)
Called patient regarding upcoming September appointments, patient is notified.  

## 2022-08-29 ENCOUNTER — Inpatient Hospital Stay: Payer: 59

## 2022-08-29 DIAGNOSIS — M159 Polyosteoarthritis, unspecified: Secondary | ICD-10-CM | POA: Diagnosis not present

## 2022-08-29 DIAGNOSIS — R52 Pain, unspecified: Secondary | ICD-10-CM | POA: Diagnosis not present

## 2022-08-29 DIAGNOSIS — C50211 Malignant neoplasm of upper-inner quadrant of right female breast: Secondary | ICD-10-CM | POA: Diagnosis not present

## 2022-08-29 DIAGNOSIS — R531 Weakness: Secondary | ICD-10-CM | POA: Diagnosis not present

## 2022-08-31 DIAGNOSIS — R52 Pain, unspecified: Secondary | ICD-10-CM | POA: Diagnosis not present

## 2022-08-31 DIAGNOSIS — C50211 Malignant neoplasm of upper-inner quadrant of right female breast: Secondary | ICD-10-CM | POA: Diagnosis not present

## 2022-08-31 DIAGNOSIS — R531 Weakness: Secondary | ICD-10-CM | POA: Diagnosis not present

## 2022-08-31 DIAGNOSIS — M159 Polyosteoarthritis, unspecified: Secondary | ICD-10-CM | POA: Diagnosis not present

## 2022-09-01 ENCOUNTER — Ambulatory Visit: Payer: 59 | Admitting: Internal Medicine

## 2022-09-05 ENCOUNTER — Other Ambulatory Visit: Payer: Self-pay | Admitting: Hematology and Oncology

## 2022-09-05 DIAGNOSIS — R2689 Other abnormalities of gait and mobility: Secondary | ICD-10-CM | POA: Diagnosis not present

## 2022-09-05 DIAGNOSIS — U071 COVID-19: Secondary | ICD-10-CM | POA: Diagnosis not present

## 2022-09-05 DIAGNOSIS — M159 Polyosteoarthritis, unspecified: Secondary | ICD-10-CM | POA: Diagnosis not present

## 2022-09-05 DIAGNOSIS — I2699 Other pulmonary embolism without acute cor pulmonale: Secondary | ICD-10-CM | POA: Diagnosis not present

## 2022-09-05 DIAGNOSIS — Z8616 Personal history of COVID-19: Secondary | ICD-10-CM | POA: Diagnosis not present

## 2022-09-05 DIAGNOSIS — Z7901 Long term (current) use of anticoagulants: Secondary | ICD-10-CM | POA: Diagnosis not present

## 2022-09-05 DIAGNOSIS — C3412 Malignant neoplasm of upper lobe, left bronchus or lung: Secondary | ICD-10-CM | POA: Diagnosis not present

## 2022-09-05 DIAGNOSIS — C7931 Secondary malignant neoplasm of brain: Secondary | ICD-10-CM | POA: Diagnosis not present

## 2022-09-05 DIAGNOSIS — D75839 Thrombocytosis, unspecified: Secondary | ICD-10-CM | POA: Diagnosis not present

## 2022-09-05 DIAGNOSIS — C3490 Malignant neoplasm of unspecified part of unspecified bronchus or lung: Secondary | ICD-10-CM

## 2022-09-05 DIAGNOSIS — G936 Cerebral edema: Secondary | ICD-10-CM | POA: Diagnosis not present

## 2022-09-05 DIAGNOSIS — R1311 Dysphagia, oral phase: Secondary | ICD-10-CM | POA: Diagnosis not present

## 2022-09-05 DIAGNOSIS — E119 Type 2 diabetes mellitus without complications: Secondary | ICD-10-CM | POA: Diagnosis not present

## 2022-09-05 DIAGNOSIS — M6250 Muscle wasting and atrophy, not elsewhere classified, unspecified site: Secondary | ICD-10-CM | POA: Diagnosis not present

## 2022-09-05 DIAGNOSIS — C50211 Malignant neoplasm of upper-inner quadrant of right female breast: Secondary | ICD-10-CM | POA: Diagnosis not present

## 2022-09-05 DIAGNOSIS — I10 Essential (primary) hypertension: Secondary | ICD-10-CM | POA: Diagnosis not present

## 2022-09-05 DIAGNOSIS — M6281 Muscle weakness (generalized): Secondary | ICD-10-CM | POA: Diagnosis not present

## 2022-09-05 DIAGNOSIS — R531 Weakness: Secondary | ICD-10-CM | POA: Diagnosis not present

## 2022-09-05 DIAGNOSIS — R52 Pain, unspecified: Secondary | ICD-10-CM | POA: Diagnosis not present

## 2022-09-05 NOTE — Progress Notes (Signed)
Treatment plan signed.

## 2022-09-06 ENCOUNTER — Telehealth: Payer: Self-pay | Admitting: Hematology and Oncology

## 2022-09-06 NOTE — Telephone Encounter (Signed)
Left a patient message regarding rescheduled appointment times/date for treatment

## 2022-09-06 NOTE — Telephone Encounter (Signed)
Called patient's niece in regards to scheduled appointment times/dates; patient 's niece has expressed they did not want to continue treatment at the moment; I have stated to patient that this will need to be discussed with Dr. Al Pimple and also for them to express their concerns as well; patient also currently has been tested positive for Covid and will need their appointment's rescheduled for a later date; patient's niece is aware of this reschedule

## 2022-09-07 ENCOUNTER — Other Ambulatory Visit: Payer: Self-pay | Admitting: *Deleted

## 2022-09-07 NOTE — Patient Outreach (Signed)
Post-Acute Care Coordinator follow up. Ms. Waltrip resides in [facility] skilled nursing facility.   Collaboration with Margy Clarks social worker. Ms. Wubben has transitioned to long term care.  No identifiable care coordination needs.   Raiford Noble, MSN, RN,BSN Post Acute Care Coordinator [phone] (Direct dial)

## 2022-09-08 DIAGNOSIS — C7931 Secondary malignant neoplasm of brain: Secondary | ICD-10-CM | POA: Diagnosis not present

## 2022-09-08 DIAGNOSIS — U071 COVID-19: Secondary | ICD-10-CM | POA: Diagnosis not present

## 2022-09-08 DIAGNOSIS — I2699 Other pulmonary embolism without acute cor pulmonale: Secondary | ICD-10-CM | POA: Diagnosis not present

## 2022-09-08 DIAGNOSIS — N200 Calculus of kidney: Secondary | ICD-10-CM | POA: Diagnosis not present

## 2022-09-08 DIAGNOSIS — E119 Type 2 diabetes mellitus without complications: Secondary | ICD-10-CM | POA: Diagnosis not present

## 2022-09-08 DIAGNOSIS — C50211 Malignant neoplasm of upper-inner quadrant of right female breast: Secondary | ICD-10-CM | POA: Diagnosis not present

## 2022-09-10 ENCOUNTER — Emergency Department (HOSPITAL_COMMUNITY): Payer: 59

## 2022-09-10 ENCOUNTER — Inpatient Hospital Stay (HOSPITAL_COMMUNITY)
Admission: EM | Admit: 2022-09-10 | Discharge: 2022-09-18 | DRG: 871 | Disposition: E | Payer: 59 | Source: Skilled Nursing Facility | Attending: Internal Medicine | Admitting: Internal Medicine

## 2022-09-10 ENCOUNTER — Inpatient Hospital Stay (HOSPITAL_COMMUNITY): Payer: 59

## 2022-09-10 ENCOUNTER — Encounter (HOSPITAL_COMMUNITY): Payer: Self-pay

## 2022-09-10 ENCOUNTER — Other Ambulatory Visit: Payer: Self-pay

## 2022-09-10 DIAGNOSIS — Z923 Personal history of irradiation: Secondary | ICD-10-CM | POA: Diagnosis not present

## 2022-09-10 DIAGNOSIS — R739 Hyperglycemia, unspecified: Secondary | ICD-10-CM | POA: Diagnosis not present

## 2022-09-10 DIAGNOSIS — R627 Adult failure to thrive: Secondary | ICD-10-CM | POA: Diagnosis not present

## 2022-09-10 DIAGNOSIS — J929 Pleural plaque without asbestos: Secondary | ICD-10-CM | POA: Diagnosis not present

## 2022-09-10 DIAGNOSIS — Z83438 Family history of other disorder of lipoprotein metabolism and other lipidemia: Secondary | ICD-10-CM

## 2022-09-10 DIAGNOSIS — Z8261 Family history of arthritis: Secondary | ICD-10-CM

## 2022-09-10 DIAGNOSIS — Z803 Family history of malignant neoplasm of breast: Secondary | ICD-10-CM

## 2022-09-10 DIAGNOSIS — Z8249 Family history of ischemic heart disease and other diseases of the circulatory system: Secondary | ICD-10-CM | POA: Diagnosis not present

## 2022-09-10 DIAGNOSIS — J9601 Acute respiratory failure with hypoxia: Secondary | ICD-10-CM | POA: Diagnosis present

## 2022-09-10 DIAGNOSIS — E785 Hyperlipidemia, unspecified: Secondary | ICD-10-CM | POA: Diagnosis present

## 2022-09-10 DIAGNOSIS — C7931 Secondary malignant neoplasm of brain: Secondary | ICD-10-CM | POA: Diagnosis not present

## 2022-09-10 DIAGNOSIS — R000 Tachycardia, unspecified: Secondary | ICD-10-CM | POA: Diagnosis not present

## 2022-09-10 DIAGNOSIS — E872 Acidosis, unspecified: Secondary | ICD-10-CM | POA: Diagnosis not present

## 2022-09-10 DIAGNOSIS — G9341 Metabolic encephalopathy: Secondary | ICD-10-CM | POA: Diagnosis not present

## 2022-09-10 DIAGNOSIS — Z9011 Acquired absence of right breast and nipple: Secondary | ICD-10-CM

## 2022-09-10 DIAGNOSIS — G40909 Epilepsy, unspecified, not intractable, without status epilepticus: Secondary | ICD-10-CM | POA: Diagnosis present

## 2022-09-10 DIAGNOSIS — R0989 Other specified symptoms and signs involving the circulatory and respiratory systems: Secondary | ICD-10-CM | POA: Diagnosis not present

## 2022-09-10 DIAGNOSIS — R7989 Other specified abnormal findings of blood chemistry: Secondary | ICD-10-CM

## 2022-09-10 DIAGNOSIS — E1165 Type 2 diabetes mellitus with hyperglycemia: Secondary | ICD-10-CM | POA: Diagnosis not present

## 2022-09-10 DIAGNOSIS — A4189 Other specified sepsis: Secondary | ICD-10-CM | POA: Diagnosis not present

## 2022-09-10 DIAGNOSIS — Z833 Family history of diabetes mellitus: Secondary | ICD-10-CM

## 2022-09-10 DIAGNOSIS — Z823 Family history of stroke: Secondary | ICD-10-CM

## 2022-09-10 DIAGNOSIS — Z7901 Long term (current) use of anticoagulants: Secondary | ICD-10-CM

## 2022-09-10 DIAGNOSIS — Z86711 Personal history of pulmonary embolism: Secondary | ICD-10-CM

## 2022-09-10 DIAGNOSIS — C3490 Malignant neoplasm of unspecified part of unspecified bronchus or lung: Secondary | ICD-10-CM | POA: Diagnosis not present

## 2022-09-10 DIAGNOSIS — I2699 Other pulmonary embolism without acute cor pulmonale: Secondary | ICD-10-CM

## 2022-09-10 DIAGNOSIS — Z66 Do not resuscitate: Secondary | ICD-10-CM | POA: Diagnosis present

## 2022-09-10 DIAGNOSIS — G8929 Other chronic pain: Secondary | ICD-10-CM | POA: Diagnosis present

## 2022-09-10 DIAGNOSIS — Z853 Personal history of malignant neoplasm of breast: Secondary | ICD-10-CM

## 2022-09-10 DIAGNOSIS — Z79899 Other long term (current) drug therapy: Secondary | ICD-10-CM

## 2022-09-10 DIAGNOSIS — R410 Disorientation, unspecified: Secondary | ICD-10-CM

## 2022-09-10 DIAGNOSIS — J690 Pneumonitis due to inhalation of food and vomit: Secondary | ICD-10-CM | POA: Diagnosis not present

## 2022-09-10 DIAGNOSIS — Z9221 Personal history of antineoplastic chemotherapy: Secondary | ICD-10-CM

## 2022-09-10 DIAGNOSIS — R6521 Severe sepsis with septic shock: Secondary | ICD-10-CM | POA: Diagnosis present

## 2022-09-10 DIAGNOSIS — J8410 Pulmonary fibrosis, unspecified: Secondary | ICD-10-CM | POA: Diagnosis not present

## 2022-09-10 DIAGNOSIS — U071 COVID-19: Secondary | ICD-10-CM | POA: Diagnosis not present

## 2022-09-10 DIAGNOSIS — J1282 Pneumonia due to coronavirus disease 2019: Secondary | ICD-10-CM | POA: Diagnosis present

## 2022-09-10 DIAGNOSIS — G936 Cerebral edema: Secondary | ICD-10-CM | POA: Diagnosis present

## 2022-09-10 DIAGNOSIS — R918 Other nonspecific abnormal finding of lung field: Secondary | ICD-10-CM | POA: Diagnosis not present

## 2022-09-10 DIAGNOSIS — Z515 Encounter for palliative care: Secondary | ICD-10-CM

## 2022-09-10 DIAGNOSIS — Z7984 Long term (current) use of oral hypoglycemic drugs: Secondary | ICD-10-CM

## 2022-09-10 DIAGNOSIS — R0902 Hypoxemia: Secondary | ICD-10-CM | POA: Diagnosis not present

## 2022-09-10 DIAGNOSIS — R6889 Other general symptoms and signs: Secondary | ICD-10-CM | POA: Diagnosis not present

## 2022-09-10 DIAGNOSIS — Z743 Need for continuous supervision: Secondary | ICD-10-CM | POA: Diagnosis not present

## 2022-09-10 DIAGNOSIS — I10 Essential (primary) hypertension: Secondary | ICD-10-CM | POA: Diagnosis present

## 2022-09-10 DIAGNOSIS — J9811 Atelectasis: Secondary | ICD-10-CM | POA: Diagnosis not present

## 2022-09-10 DIAGNOSIS — Z6822 Body mass index (BMI) 22.0-22.9, adult: Secondary | ICD-10-CM

## 2022-09-10 DIAGNOSIS — R220 Localized swelling, mass and lump, head: Secondary | ICD-10-CM | POA: Diagnosis not present

## 2022-09-10 DIAGNOSIS — I499 Cardiac arrhythmia, unspecified: Secondary | ICD-10-CM | POA: Diagnosis not present

## 2022-09-10 LAB — COMPREHENSIVE METABOLIC PANEL
ALT: 31 U/L (ref 0–44)
AST: 14 U/L — ABNORMAL LOW (ref 15–41)
Albumin: 2.4 g/dL — ABNORMAL LOW (ref 3.5–5.0)
Alkaline Phosphatase: 73 U/L (ref 38–126)
Anion gap: 10 (ref 5–15)
BUN: 19 mg/dL (ref 8–23)
CO2: 21 mmol/L — ABNORMAL LOW (ref 22–32)
Calcium: 8.9 mg/dL (ref 8.9–10.3)
Chloride: 109 mmol/L (ref 98–111)
Creatinine, Ser: 0.46 mg/dL (ref 0.44–1.00)
GFR, Estimated: >60 mL/min (ref >60)
Glucose, Bld: 395 mg/dL — ABNORMAL HIGH (ref 70–99)
Potassium: 3.7 mmol/L (ref 3.5–5.1)
Sodium: 140 mmol/L (ref 135–145)
Total Bilirubin: 1 mg/dL (ref 0.3–1.2)
Total Protein: 5.9 g/dL — ABNORMAL LOW (ref 6.5–8.1)

## 2022-09-10 LAB — CBC WITH DIFFERENTIAL/PLATELET
Abs Immature Granulocytes: 0.04 10*3/uL (ref 0.00–0.07)
Basophils Absolute: 0.1 10*3/uL (ref 0.0–0.1)
Basophils Relative: 2 %
Eosinophils Absolute: 0 10*3/uL (ref 0.0–0.5)
Eosinophils Relative: 0 %
HCT: 42.3 % (ref 36.0–46.0)
Hemoglobin: 12.7 g/dL (ref 12.0–15.0)
Immature Granulocytes: 1 %
Lymphocytes Relative: 3 %
Lymphs Abs: 0.1 10*3/uL — ABNORMAL LOW (ref 0.7–4.0)
MCH: 31.6 pg (ref 26.0–34.0)
MCHC: 30 g/dL (ref 30.0–36.0)
MCV: 105.2 fL — ABNORMAL HIGH (ref 80.0–100.0)
Monocytes Absolute: 0.3 10*3/uL (ref 0.1–1.0)
Monocytes Relative: 7 %
Neutro Abs: 4.5 10*3/uL (ref 1.7–7.7)
Neutrophils Relative %: 87 %
Platelets: 169 10*3/uL (ref 150–400)
RBC: 4.02 MIL/uL (ref 3.87–5.11)
RDW: 17.7 % — ABNORMAL HIGH (ref 11.5–15.5)
WBC: 5.1 10*3/uL (ref 4.0–10.5)
nRBC: 0 % (ref 0.0–0.2)

## 2022-09-10 LAB — BLOOD GAS, VENOUS
Acid-base deficit: 0.2 mmol/L (ref 0.0–2.0)
Bicarbonate: 24 mmol/L (ref 20.0–28.0)
O2 Saturation: 96.5 %
Patient temperature: 37
pCO2, Ven: 37 mmHg — ABNORMAL LOW (ref 44–60)
pH, Ven: 7.42 (ref 7.25–7.43)
pO2, Ven: 73 mmHg — ABNORMAL HIGH (ref 32–45)

## 2022-09-10 LAB — TROPONIN I (HIGH SENSITIVITY)
Troponin I (High Sensitivity): 313 ng/L (ref <18)
Troponin I (High Sensitivity): 311 ng/L (ref <18)

## 2022-09-10 LAB — BRAIN NATRIURETIC PEPTIDE: B Natriuretic Peptide: 319.6 pg/mL — ABNORMAL HIGH (ref 0.0–100.0)

## 2022-09-10 LAB — CBG MONITORING, ED: Glucose-Capillary: 354 mg/dL — ABNORMAL HIGH (ref 70–99)

## 2022-09-10 LAB — GLUCOSE, CAPILLARY: Glucose-Capillary: 281 mg/dL — ABNORMAL HIGH (ref 70–99)

## 2022-09-10 MED ORDER — ENOXAPARIN SODIUM 40 MG/0.4ML IJ SOSY: 40.0000 mg | PREFILLED_SYRINGE | INTRAMUSCULAR | Status: DC

## 2022-09-10 MED ORDER — SODIUM CHLORIDE 0.9 % IV SOLN
3.0000 g | Freq: Four times a day (QID) | INTRAVENOUS | Status: DC
  Filled 2022-09-10: qty 8

## 2022-09-10 MED ORDER — LABETALOL HCL 5 MG/ML IV SOLN: 20.0000 mg | INTRAVENOUS | Status: DC | PRN

## 2022-09-10 MED ORDER — KCL IN DEXTROSE-NACL 40-5-0.45 MEQ/L-%-% IV SOLN
INTRAVENOUS | Status: DC
  Filled 2022-09-10 (×2): qty 1000

## 2022-09-10 MED ORDER — SODIUM CHLORIDE 0.9 % IV SOLN
200.0000 mg | Freq: Once | INTRAVENOUS | Status: AC
  Administered 2022-09-10: 200 mg via INTRAVENOUS
  Filled 2022-09-10: qty 40

## 2022-09-10 MED ORDER — METHYLPREDNISOLONE SODIUM SUCC 125 MG IJ SOLR
125.0000 mg | Freq: Once | INTRAMUSCULAR | Status: AC
  Administered 2022-09-10: 125 mg via INTRAVENOUS
  Filled 2022-09-10: qty 2

## 2022-09-10 MED ORDER — CHLORHEXIDINE GLUCONATE CLOTH 2 % EX PADS: 6.0000 | MEDICATED_PAD | Freq: Every day | CUTANEOUS | Status: DC

## 2022-09-10 MED ORDER — HYDROXYZINE HCL 10 MG PO TABS: 10.0000 mg | ORAL_TABLET | Freq: Two times a day (BID) | ORAL | Status: DC | PRN

## 2022-09-10 MED ORDER — ATORVASTATIN CALCIUM 40 MG PO TABS: 80.0000 mg | ORAL_TABLET | Freq: Every day | ORAL | Status: DC

## 2022-09-10 MED ORDER — INSULIN ASPART 100 UNIT/ML IJ SOLN
0.0000 [IU] | Freq: Three times a day (TID) | INTRAMUSCULAR | Status: DC
  Administered 2022-09-10 – 2022-09-11 (×2): 15 [IU] via SUBCUTANEOUS
  Filled 2022-09-10: qty 0.15

## 2022-09-10 MED ORDER — SODIUM CHLORIDE 0.9% FLUSH: 10.0000 mL | INTRAVENOUS | Status: DC | PRN

## 2022-09-10 MED ORDER — SODIUM CHLORIDE 0.9 % IV SOLN
100.0000 mg | Freq: Every day | INTRAVENOUS | Status: DC
  Administered 2022-09-11: 100 mg via INTRAVENOUS
  Filled 2022-09-10: qty 20

## 2022-09-10 MED ORDER — DEXAMETHASONE SODIUM PHOSPHATE 4 MG/ML IJ SOLN
4.0000 mg | Freq: Two times a day (BID) | INTRAMUSCULAR | Status: DC
  Administered 2022-09-10 – 2022-09-11 (×2): 4 mg via INTRAVENOUS
  Filled 2022-09-10 (×2): qty 1

## 2022-09-10 MED ORDER — LEVETIRACETAM IN NACL 500 MG/100ML IV SOLN
500.0000 mg | Freq: Two times a day (BID) | INTRAVENOUS | Status: DC
  Administered 2022-09-10 – 2022-09-11 (×2): 500 mg via INTRAVENOUS
  Filled 2022-09-10 (×2): qty 100

## 2022-09-10 MED ORDER — IPRATROPIUM-ALBUTEROL 0.5-2.5 (3) MG/3ML IN SOLN
3.0000 mL | RESPIRATORY_TRACT | Status: AC
  Administered 2022-09-10 (×3): 3 mL via RESPIRATORY_TRACT
  Filled 2022-09-10: qty 9
  Filled 2022-09-10: qty 3

## 2022-09-10 MED ORDER — HEPARIN SOD (PORK) LOCK FLUSH 100 UNIT/ML IV SOLN
500.0000 [IU] | Freq: Once | INTRAVENOUS | Status: DC
  Filled 2022-09-10: qty 5

## 2022-09-10 MED ORDER — LEVETIRACETAM 500 MG PO TABS: 500.0000 mg | ORAL_TABLET | Freq: Two times a day (BID) | ORAL | Status: DC

## 2022-09-10 MED ORDER — METHOCARBAMOL 500 MG PO TABS: 250.0000 mg | ORAL_TABLET | Freq: Three times a day (TID) | ORAL | Status: DC | PRN

## 2022-09-10 MED ORDER — INSULIN ASPART 100 UNIT/ML IJ SOLN
0.0000 [IU] | Freq: Every day | INTRAMUSCULAR | Status: DC
  Administered 2022-09-10: 3 [IU] via SUBCUTANEOUS
  Filled 2022-09-10: qty 0.05

## 2022-09-10 MED ORDER — HYDROMORPHONE HCL 1 MG/ML IJ SOLN: 0.2500 mg | INTRAMUSCULAR | Status: DC | PRN

## 2022-09-10 MED ORDER — ACETAMINOPHEN 650 MG RE SUPP
650.0000 mg | Freq: Four times a day (QID) | RECTAL | Status: DC | PRN
  Administered 2022-09-11: 650 mg via RECTAL
  Filled 2022-09-10: qty 1

## 2022-09-10 MED ORDER — ATENOLOL 25 MG PO TABS: 12.5000 mg | ORAL_TABLET | Freq: Every day | ORAL | Status: DC

## 2022-09-10 MED ORDER — OXYCODONE HCL 5 MG PO TABS: 5.0000 mg | ORAL_TABLET | ORAL | Status: DC | PRN

## 2022-09-10 MED ORDER — IOHEXOL 350 MG/ML SOLN: 75.0000 mL | Freq: Once | INTRAVENOUS | Status: DC | PRN

## 2022-09-10 MED ORDER — VITAMIN D3 25 MCG (1000 UNIT) PO TABS: 5000.0000 [IU] | ORAL_TABLET | Freq: Every day | ORAL | Status: DC

## 2022-09-10 MED ORDER — PANTOPRAZOLE SODIUM 40 MG IV SOLR
40.0000 mg | Freq: Every day | INTRAVENOUS | Status: DC
  Administered 2022-09-10: 40 mg via INTRAVENOUS
  Filled 2022-09-10: qty 10

## 2022-09-10 MED ORDER — IPRATROPIUM-ALBUTEROL 0.5-2.5 (3) MG/3ML IN SOLN
3.0000 mL | RESPIRATORY_TRACT | Status: DC
  Administered 2022-09-10 – 2022-09-11 (×3): 3 mL via RESPIRATORY_TRACT
  Filled 2022-09-10 (×3): qty 3

## 2022-09-10 MED ORDER — APIXABAN 5 MG PO TABS: 5.0000 mg | ORAL_TABLET | Freq: Two times a day (BID) | ORAL | Status: DC

## 2022-09-10 MED ORDER — ACETAMINOPHEN 325 MG PO TABS: 650.0000 mg | ORAL_TABLET | Freq: Four times a day (QID) | ORAL | Status: DC | PRN

## 2022-09-10 MED ORDER — POLYETHYLENE GLYCOL 3350 17 G PO PACK: 17.0000 g | PACK | Freq: Every day | ORAL | Status: DC | PRN

## 2022-09-10 MED ORDER — IPRATROPIUM-ALBUTEROL 0.5-2.5 (3) MG/3ML IN SOLN: 3.0000 mL | RESPIRATORY_TRACT | Status: DC | PRN

## 2022-09-10 NOTE — Assessment & Plan Note (Addendum)
In the setting of underlying diabetes mellitus now status post methylprednisolone in the ER.  I was managed with insulin sliding scale.  Hold metformin

## 2022-09-10 NOTE — Assessment & Plan Note (Addendum)
CT head shows multiple metastases.  With an additional stressor at this time of acute hypoxemia.  I think this adequately explains patient's current delirium.  Exam is nonfocal.  We will continue with clinical monitoring at this time and see how patient responds to reversal of her metabolic stressors.  Change oxycodone to as needed.  Hold trazodone and quetiapine

## 2022-09-10 NOTE — ED Notes (Signed)
ED TO INPATIENT HANDOFF REPORT  Name/Age/Gender Jennifer Cowan 77 y.o. female  Code Status    Code Status Orders  (From admission, onward)           Start     Ordered   08/24/2022 1714  Do not attempt resuscitation (DNR)  Continuous       Question Answer Comment  If patient has no pulse and is not breathing Do Not Attempt Resuscitation   If patient has a pulse and/or is breathing: Medical Treatment Goals LIMITED ADDITIONAL INTERVENTIONS: Use medication/IV fluids and cardiac monitoring as indicated; Do not use intubation or mechanical ventilation (DNI), also provide comfort medications.  Transfer to Progressive/Stepdown as indicated, avoid Intensive Care.   Consent: Discussion documented in EHR or advanced directives reviewed      08/19/2022 1714           Code Status History     Date Active Date Inactive Code Status Order ID Comments User Context   07/31/2022 2332 08/09/2022 2221 Full Code [id number]  John Giovanni, MD ED   06/27/2022 1159 06/30/2022 2152 Full Code [id number]  Almond Lint, MD Inpatient   07/24/2021 1911 07/30/2021 2210 Full Code [id number]  Charlsie Quest, MD ED   07/24/2021 1707 07/24/2021 1911 Full Code [id number]  Tommie Raymond, MD ED       Home/SNF/Other Nursing Home  Chief Complaint COVID-19 [U07.1]  Level of Care/Admitting Diagnosis ED Disposition     ED Disposition  Admit   Condition  --   Comment  Hospital Area: [facility] [[id number]]  Level of Care: Progressive [102]  Admit to Progressive based on following criteria: RESPIRATORY PROBLEMS hypoxemic/hypercapnic respiratory failure that is responsive to NIPPV (BiPAP) or High Flow Nasal Cannula (6-80 lpm). Frequent assessment/intervention, no > Q2 hrs < Q4 hrs, to maintain oxygenation and pulmonary hygiene.  May admit patient to [facility] or [facility] if equivalent level of care is available:: No  Covid Evaluation: Confirmed COVID Positive  Diagnosis: COVID-19  [[id number]]  Admitting Physician: Maryjean Ka [facility] [[id number]]  Attending Physician: Nolberto Hanlon [[id number]]  Certification:: I certify this patient will need inpatient services for at least 2 midnights  Expected Medical Readiness: 09/15/2022          Medical History Past Medical History:  Diagnosis Date   Anemia    Arthritis    Breast cancer (HCC)    Cancer (HCC) 1999   Breast Cancer   Family history of breast cancer    Hyperlipidemia    Hypertension    Osteopenia 01/12/2021   Personal history of malignant neoplasm of breast     Allergies No Known Allergies  IV Location/Drains/Wounds Patient Lines/Drains/Airways Status     Active Line/Drains/Airways     Name Placement date Placement time Site Days   Implanted Port 08/20/21 Right Chest 08/20/21  1100  Chest  386   Peripheral IV 08/20/2022 20 G Anterior;Left;Proximal Forearm 09/13/2022  1440  Forearm  less than 1   Closed System Drain Right;Lateral Breast Bulb (JP) 19 Fr. 06/27/22  0943  Breast  75            Labs/Imaging Results for orders placed or performed during the hospital encounter of 09/07/2022 (from the past 48 hour(s))  CBC with Differential     Status: Abnormal   Collection Time: 09/02/2022  2:12 PM  Result Value Ref Range   WBC 5.1 4.0 - 10.5 K/uL   RBC 4.02 3.87 - 5.11 MIL/uL   Hemoglobin 12.7  12.0 - 15.0 g/dL   HCT 32.9 51.8 - 84.1 %   MCV 105.2 (H) 80.0 - 100.0 fL   MCH 31.6 26.0 - 34.0 pg   MCHC 30.0 30.0 - 36.0 g/dL   RDW 66.0 (H) 63.0 - 16.0 %   Platelets 169 150 - 400 K/uL   nRBC 0.0 0.0 - 0.2 %   Neutrophils Relative % 87 %   Neutro Abs 4.5 1.7 - 7.7 K/uL   Lymphocytes Relative 3 %   Lymphs Abs 0.1 (L) 0.7 - 4.0 K/uL   Monocytes Relative 7 %   Monocytes Absolute 0.3 0.1 - 1.0 K/uL   Eosinophils Relative 0 %   Eosinophils Absolute 0.0 0.0 - 0.5 K/uL   Basophils Relative 2 %   Basophils Absolute 0.1 0.0 - 0.1 K/uL   Immature Granulocytes 1 %   Abs Immature Granulocytes 0.04 0.00 - 0.07 K/uL     Comment: Performed at [facility], [address] [place], [place] [address]  Brain natriuretic peptide     Status: Abnormal   Collection Time: 09/16/2022  2:12 PM  Result Value Ref Range   B Natriuretic Peptide 319.6 (H) 0.0 - 100.0 pg/mL    Comment: Performed at [facility], [address] [place], [place] [address]  Blood gas, venous (at [facility] and [facility])     Status: Abnormal   Collection Time: 08/19/2022  2:33 PM  Result Value Ref Range   pH, Ven 7.42 7.25 - 7.43   pCO2, Ven 37 (L) 44 - 60 mmHg   pO2, Ven 73 (H) 32 - 45 mmHg   Bicarbonate 24.0 20.0 - 28.0 mmol/L   Acid-base deficit 0.2 0.0 - 2.0 mmol/L   O2 Saturation 96.5 %   Patient temperature 37.0     Comment: Performed at [facility], [address] [place], [place] [address]  Comprehensive metabolic panel     Status: Abnormal   Collection Time: 09/05/2022  3:42 PM  Result Value Ref Range   Sodium 140 135 - 145 mmol/L   Potassium 3.7 3.5 - 5.1 mmol/L   Chloride 109 98 - 111 mmol/L   CO2 21 (L) 22 - 32 mmol/L   Glucose, Bld 395 (H) 70 - 99 mg/dL    Comment: Glucose reference range applies only to samples taken after fasting for at least 8 hours.   BUN 19 8 - 23 mg/dL   Creatinine, Ser 4.27 0.44 - 1.00 mg/dL   Calcium 8.9 8.9 - 06.2 mg/dL   Total Protein 5.9 (L) 6.5 - 8.1 g/dL   Albumin 2.4 (L) 3.5 - 5.0 g/dL   AST 14 (L) 15 - 41 U/L   ALT 31 0 - 44 U/L   Alkaline Phosphatase 73 38 - 126 U/L   Total Bilirubin 1.0 0.3 - 1.2 mg/dL   GFR, Estimated >37 >62 mL/min    Comment: (NOTE) Calculated using the CKD-EPI Creatinine Equation (2021)    Anion gap 10 5 - 15    Comment: Performed at [facility], [address] [place], [place] [address]  Troponin I (High Sensitivity)     Status: Abnormal   Collection Time: 09/05/2022  3:43 PM  Result Value Ref Range   Troponin I (High Sensitivity) 313 (HH) <18 ng/L    Comment: CRITICAL RESULT CALLED TO, READ BACK BY  AND VERIFIED WITH LIVINGSTON, K @ 1649 ON 09/04/2022 BY XIONG, K (NOTE) Elevated high sensitivity troponin I (hsTnI) values  and significant  changes across serial measurements may suggest ACS but many other  chronic and acute conditions are known to elevate hsTnI results.  Refer to the "Links" section for chest pain algorithms and additional  guidance. Performed at [facility], [address] [place], [place] [address]    CT Head Wo Contrast  Result Date: 08/19/2022 CLINICAL DATA:  Altered mental status. EXAM: CT HEAD WITHOUT CONTRAST TECHNIQUE: Contiguous axial images were obtained from the base of the skull through the vertex without intravenous contrast. RADIATION DOSE REDUCTION: This exam was performed according to the departmental dose-optimization program which includes automated exposure control, adjustment of the mA and/or kV according to patient size and/or use of iterative reconstruction technique. COMPARISON:  Head CT 08/07/2022, additional priors reviewed. FINDINGS: Brain: Sequela of intracranial metastatic disease with edema in the right cerebral hemisphere centered around a posterior temporal mass, overall mild improvement in cerebral edema from prior. Edema in the posterior left frontal lobe, unchanged or minimally worsened. No hemorrhage. No midline shift. No hydrocephalus. No subdural or extra-axial collection. Vascular: Atherosclerosis of skullbase vasculature without hyperdense vessel or abnormal calcification. Skull: No calvarial lesion or fracture. Sinuses/Orbits: No acute finding. Other: None. IMPRESSION: 1. Known intracranial metastatic disease. Slight improvement in right cerebral edema, left frontal lobe edema is unchanged or minimally progressed. 2. No hemorrhage or midline shift. Electronically Signed   By: Narda Rutherford M.D.   On: 09/15/2022 15:49   DG Chest Port 1 View  Result Date: 09/12/2022 CLINICAL DATA:  COVID positive.  Hypoxia EXAM: PORTABLE  CHEST 1 VIEW COMPARISON:  X-ray 07/31/2022.  CT 08/04/2022 FINDINGS: Normal cardiopericardial silhouette. No consolidation, pneumothorax, effusion or edema. Persistent left perihilar, suprahilar opacity with pleural thickening and retraction. Please correlate with history. Right IJ chest port with tip along the central SVC above the right atrium. Overlapping cardiac leads. Surgical clips in the right axillary region. IMPRESSION: No significant interval change. Stable left perihilar and upper lung opacity with surgical changes and chest port Electronically Signed   By: Karen Kays M.D.   On: 08/22/2022 14:01    Pending Labs Unresulted Labs (From admission, onward)     Start     Ordered   09/17/22 0500  Creatinine, serum  (enoxaparin (LOVENOX)    CrCl >/= 30 ml/min)  Weekly,   R     Comments: while on enoxaparin therapy    08/18/2022 1714   08/22/2022 0500  APTT  Tomorrow morning,   R        08/28/2022 1714   08/31/2022 0500  Protime-INR  Tomorrow morning,   R        09/09/2022 1714   09/14/2022 0500  Basic metabolic panel  Tomorrow morning,   R        09/02/2022 1714   09/07/2022 0500  CBC  Tomorrow morning,   R        08/22/2022 1714   09/09/2022 1714  CBC  (enoxaparin (LOVENOX)    CrCl >/= 30 ml/min)  Once,   R       Comments: Baseline for enoxaparin therapy IF NOT ALREADY DRAWN.  Notify MD if PLT < 100 K.    08/21/2022 1714   08/28/2022 1714  Creatinine, serum  (enoxaparin (LOVENOX)    CrCl >/= 30 ml/min)  Once,   R       Comments: Baseline for enoxaparin therapy IF NOT ALREADY DRAWN.    08/18/2022 1714   08/28/2022 1710  Blood gas, arterial  ONCE - STAT,   R        09/01/2022 1709   09/07/2022 1239  Blood culture (routine x 2)  BLOOD CULTURE X 2,   R (with STAT occurrences)      08/26/2022 1239            Vitals/Pain Today's Vitals   09/09/2022 1520 09/01/2022 1600 09/08/2022 1700 09/02/2022 1704  BP: 109/69 106/79 115/73   Pulse: (!) 110 (!) 108 (!) 117 (!) 119  Resp: (!) 26 (!) 26 (!) 28 (!) 24  Temp:    98  F (36.7 C)  TempSrc:      SpO2: 91% 90% (!) 89% 91%    Isolation Precautions No active isolations  Medications Medications  enoxaparin (LOVENOX) injection 40 mg (has no administration in time range)  acetaminophen (TYLENOL) tablet 650 mg (has no administration in time range)    Or  acetaminophen (TYLENOL) suppository 650 mg (has no administration in time range)  polyethylene glycol (MIRALAX / GLYCOLAX) packet 17 g (has no administration in time range)  dextrose 5 % and 0.45 % NaCl with KCl 40 mEq/L infusion (has no administration in time range)  pantoprazole (PROTONIX) injection 40 mg (has no administration in time range)  insulin aspart (novoLOG) injection 0-15 Units (has no administration in time range)  insulin aspart (novoLOG) injection 0-5 Units (has no administration in time range)  ipratropium-albuterol (DUONEB) 0.5-2.5 (3) MG/3ML nebulizer solution 3 mL (3 mLs Nebulization Given 08/27/2022 1408)  methylPREDNISolone sodium succinate (SOLU-MEDROL) 125 mg/2 mL injection 125 mg (125 mg Intravenous Given 09/10/22 1324)    Mobility non-ambulatory

## 2022-09-10 NOTE — Progress Notes (Signed)
   08/30/2022 2349  BiPAP/CPAP/SIPAP  BiPAP/CPAP/SIPAP Pt Type Adult  BiPAP/CPAP/SIPAP V60  Mask Type Full face mask  Mask Size Medium  Set Rate 20 breaths/min  Respiratory Rate 36 breaths/min  IPAP 18 cmH20  EPAP 8 cmH2O  FiO2 (%) 80 %  Minute Ventilation 18  Leak 0  Peak Inspiratory Pressure (PIP) 20  Tidal Volume (Vt) 490  Patient Home Equipment No  Auto Titrate No  Press High Alarm 35 cmH2O  Press Low Alarm 5 cmH2O  BiPAP/CPAP /SiPAP Vitals  Pulse Rate (!) 138  Resp (!) 26  BP 101/67  SpO2 100 %  Bilateral Breath Sounds Diminished;Expiratory wheezes  MEWS Score/Color  MEWS Score 6  MEWS Score Color Red

## 2022-09-10 NOTE — Assessment & Plan Note (Signed)
This is most consistent with hypoxemia related troponin leak.  Acute coronary syndrome is highly unlikely with this presentation.  Repeat troponin level is pending

## 2022-09-10 NOTE — Progress Notes (Signed)
2055 HFNC 6L SAT 79% MD notified due to request to transport to CT, Cancelled due to unstable. Orders received 2100 HFNC 10L SAT 82% Call RR RN 2105 HFNC 12L 82% 2111 FNC with humification 15L 85% RRT RN on unit

## 2022-09-10 NOTE — Progress Notes (Incomplete)
    Patient Name: Jennifer Cowan           DOB: May 06, 1945  MRN: [id number]      Admission Date: 08/27/2022  Attending Provider: Nolberto Hanlon, MD  Primary Diagnosis: COVID-19   Level of care: Stepdown    CROSS COVER NOTE   Date of Service   08/22/2022   FATOU GEFFRARD, 77 y.o. female, was admitted on 08/23/2022 for COVID-19.    HPI/Events of Note   Acute respiratory failure, COVID-19 RN reports hypoxemia, spO2 79-86% on 10 L HFNC.  Patient placed on 15 L and spO2 ~90%.  Patient was previously on 6 L. Patient also found to be tachycardic and tachypneic.  Concerns for rapid respiratory decline.  Will place orders for transfer to SDU and BiPAP therapy. Continue antibiotic therapy for aspiration pneumonia.  Continue treatment for COVID 19, remdesivir and Decadron.   Addendum 0120-  Septic shock, Hypotensive SBP 80's with MAP < 60. Low grade temp 100.4. Tachycardic.  Orders placed for lactic acid, fluid bolus, IV albumin, broadening abx to vanc, flagyl, cefepime.  Critical Lactic acid- 5.0. Treating with fluid bolus. Repeat this morning.    Addendum 54- Spoke with niece, Roque Lias via phone regarding patient's care and decline.  Patient was initially responding to IV fluids and albumin, however BP is once again dropping to SBP 70s.  Ms. Dan Humphreys states that patient had mentioned a couple days ago that she was tired and would not want aggressive measures/treatment.  The patient is currently DNR/DNI.    At this time Ms. Walker would like to continue with treatment that is being given.  She is okay with patient remaining on BiPAP as long as she is tolerating it and is comfortable.  In terms of blood pressure treatment, Ms. Walker would like to try pressors for a short term to see if it can help with pts infection as well as give family time to come see her.    Overall, Ms. Dan Humphreys emphasizes the importance of keeping the patient comfortable.  Based on my conversation with the patient's  niece, family may want to pursue comfort care if there is not much improvement with pressors. PCCM (Dr. Tonia Brooms) was made aware of pressors being started.   Interventions/ Plan   Neb treatment BiPAP Transferred to SDU VBG Lactic acid --> 5.0 -->  Fluid bolus, 1.5L IV albumin Broad abx  Levophed        Anthoney Harada, DNP, ACNPC- AG [facility]

## 2022-09-10 NOTE — ED Provider Notes (Signed)
Handoff received from Dr. Adela Lank.  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  Vitals are notable for elevated BNP, chest x-ray without acute pulmonary edema.  CBC unremarkable.  Blood gas okay.  CT head shows improved cerebral edema.  She is on new ox requirement.  CMP with hyperglycemia but no signs of DKA.  Troponin significant elevated but stable, likely due to hypoxia and known COVID infection.  She has no chest pain, concern for ACS.  Discussed with hospitalist and admitted.   Laurence Spates, MD 09/04/2022 [id number]

## 2022-09-10 NOTE — ED Provider Notes (Signed)
[place] [facility]ERGENC[facility] DEPARTMENT AT [facility] Provider Note   CSN: [id number] Arrival date & time: 08/26/2022  1217     History  Chief Complaint  Patient presents with   Altered Mental Status    Jennifer Cowan is a 77 y.o. female.  77 yo F with a chief complaints of difficulty breathing.  The patient was diagnosed with COVID earlier in the week and has gotten progressively worse.  Per the EMS report the patient was found to be hypoxic with an oxygen saturation in the low 80s on arrival not on oxygen at home.  Patient unfortunately is unable to provide any history.  Level 5 caveat.   Altered Mental Status      Home Medications Prior to Admission medications   Medication Sig Start Date End Date Taking? Authorizing Provider  Accu-Chek Softclix Lancets lancets Use to check blood sugar as directed 4 times a day 02/14/22   Pickenpack-Cousar, Arty Baumgartner, NP  acetaminophen (TYLENOL) 500 MG tablet Take 1,000 mg by mouth every 6 (six) hours as needed (for back pain).    [provider]  apixaban (ELIQUIS) 5 MG TABS tablet Take 1 tablet (5 mg total) by mouth 2 (two) times daily. 10 mg twice daily for 4 days, then on 7/28 switch to 5 mg twice daily 08/09/22   Leatha Gilding, MD  atenolol (TENORMIN) 25 MG tablet Take 0.5 tablets (12.5 mg total) by mouth daily. 07/27/22   [name], FNP  atorvastatin (LIPITOR) 80 MG tablet Take 1 tablet (80 mg total) by mouth daily. Patient taking differently: Take 80 mg by mouth at bedtime. 10/08/21   [name], FNP  blood glucose meter kit and supplies KIT Dispense based on patient and insurance preference. Use up to four times daily as directed. 08/31/21   Loa Socks, NP  Blood Glucose Monitoring Suppl (ACCU-CHEK GUIDE) w/Device KIT Use as directed 4 times a day 08/31/21   Loa Socks, NP  Cholecalciferol (VITAMIN D3) 125 MCG (5000 UT) CAPS Take 5,000 Units by mouth daily.    [provider]   dexamethasone (DECADRON) 4 MG tablet Take 1 tablet (4 mg total) by mouth 2 (two) times daily. 08/09/22   Leatha Gilding, MD  ferrous sulfate 325 (65 FE) MG tablet Take 325 mg by mouth daily with breakfast.    [provider]  glucose blood (ACCU-CHEK GUIDE) test strip Use as directed 4 times a day 01/26/22   Pickenpack-Cousar, Arty Baumgartner, NP  ketoconazole (NIZORAL) 2 % cream Apply 1 Application topically See admin instructions. Apply to both feet 2 times a day 03/15/22   [provider]  levETIRAcetam (KEPPRA) 500 MG tablet Take 1 tablet (500 mg total) by mouth 2 (two) times daily. 06/12/22 07/31/22  Loetta Rough, MD  lidocaine (LIDODERM) 5 % Place 2 patches onto the skin daily. Remove & Discard patch within 12 hours or as directed by MD 08/10/22   Leatha Gilding, MD  lidocaine-prilocaine (EMLA) cream Apply 1 Application topically as needed [facility] access).    [provider]  ondansetron (ZOFRAN) 8 MG tablet Take 1 tablet (8 mg total) by mouth every 8 (eight) hours as needed for nausea or vomiting. Patient not taking: Reported on 07/31/2022 07/11/22   Rachel Moulds, MD  oxyCODONE (OXY IR/ROXICODONE) 5 MG immediate release tablet Take 1-2 tablets (5-10 mg total) by mouth every 4 (four) hours as needed for moderate pain. 08/09/22   Leatha Gilding,  MD  pantoprazole (PROTONIX) 40 MG tablet Take 1 tablet (40 mg total) by mouth daily. 08/10/22   Leatha Gilding, MD  prochlorperazine (COMPAZINE) 10 MG tablet Take 1 tablet (10 mg total) by mouth every 6 (six) hours as needed for nausea or vomiting. Patient not taking: Reported on 07/31/2022 07/11/22   Rachel Moulds, MD  QUEtiapine (SEROQUEL) 25 MG tablet Take 0.5 tablets (12.5 mg total) by mouth 2 (two) times daily. 08/09/22   Leatha Gilding, MD      Allergies    Patient has no known allergies.    Review of Systems   Review of Systems  Physical Exam Updated Vital Signs BP 112/66   Pulse (!) 112   Temp 98.2 F (36.8  C) (Oral)   Resp (!) 29   SpO2 91%  Physical Exam Vitals and nursing note reviewed.  Constitutional:      General: She is not in acute distress.    Appearance: She is well-developed. She is not diaphoretic.  HENT:     Head: Normocephalic and atraumatic.  Eyes:     Pupils: Pupils are equal, round, and reactive to light.  Cardiovascular:     Rate and Rhythm: Normal rate and regular rhythm.     Heart sounds: No murmur heard.    No friction rub. No gallop.  Pulmonary:     Effort: Pulmonary effort is normal.     Breath sounds: Wheezing present. No rales.  Abdominal:     General: There is no distension.     Palpations: Abdomen is soft.     Tenderness: There is no abdominal tenderness.  Musculoskeletal:        General: No tenderness.     Cervical back: Normal range of motion and neck supple.  Skin:    General: Skin is warm and dry.  Neurological:     Mental Status: She is alert and oriented to person, place, and time.  Psychiatric:        Behavior: Behavior normal.     ED Results / Procedures / Treatments   Labs (all labs ordered are listed, but only abnormal results are displayed) Labs Reviewed  CBC WITH DIFFERENTIAL/PLATELET - Abnormal; Notable for the following components:      Result Value   MCV 105.2 (*)    RDW 17.7 (*)    Lymphs Abs 0.1 (*)    All other components within normal limits  BRAIN NATRIURETIC PEPTIDE - Abnormal; Notable for the following components:   B Natriuretic Peptide 319.6 (*)    All other components within normal limits  BLOOD GAS, VENOUS - Abnormal; Notable for the following components:   pCO2, Ven 37 (*)    pO2, Ven 73 (*)    All other components within normal limits  CULTURE, BLOOD (ROUTINE X 2)  CULTURE, BLOOD (ROUTINE X 2)  COMPREHENSIVE METABOLIC PANEL  TROPONIN I (HIGH SENSITIVITY)    EKG EKG Interpretation Date/Time:  Saturday September 10 2022 12:36:41 EDT Ventricular Rate:  122 PR Interval:  112 QRS Duration:  78 QT  Interval:  278 QTC Calculation: 396 R Axis:   52  Text Interpretation: Sinus tachycardia Probable left atrial enlargement Nonspecific repol abnormality, diffuse leads Since last tracing rate faster Otherwise no significant change Confirmed by Melene Plan [phone]) on 09/07/2022 1:18:47 PM  Radiology DG Chest Port 1 View  Result Date: 08/28/2022 CLINICAL DATA:  COVID positive.  Hypoxia EXAM: PORTABLE CHEST 1 VIEW COMPARISON:  X-ray 07/31/2022.  CT 08/04/2022 FINDINGS: Normal  cardiopericardial silhouette. No consolidation, pneumothorax, effusion or edema. Persistent left perihilar, suprahilar opacity with pleural thickening and retraction. Please correlate with history. Right IJ chest port with tip along the central SVC above the right atrium. Overlapping cardiac leads. Surgical clips in the right axillary region. IMPRESSION: No significant interval change. Stable left perihilar and upper lung opacity with surgical changes and chest port Electronically Signed   By: Karen Kays M.D.   On: 09/09/2022 14:01    Procedures .Critical Care  Performed by: Melene Plan, DO Authorized by: Melene Plan, DO   Critical care provider statement:    Critical care time (minutes):  35   Critical care time was exclusive of:  Separately billable procedures and treating other patients   Critical care was time spent personally by me on the following activities:  Development of treatment plan with patient or surrogate, discussions with consultants, evaluation of patient's response to treatment, examination of patient, ordering and review of laboratory studies, ordering and review of radiographic studies, ordering and performing treatments and interventions, pulse oximetry, re-evaluation of patient's condition and review of old charts   Care discussed with: admitting provider       Medications Ordered in ED Medications  ipratropium-albuterol (DUONEB) 0.5-2.5 (3) MG/3ML nebulizer solution 3 mL (3 mLs Nebulization Given  08/28/2022 1408)  methylPREDNISolone sodium succinate (SOLU-MEDROL) 125 mg/2 mL injection 125 mg (125 mg Intravenous Given 08/22/2022 1324)    ED Course/ Medical Decision Making/ A&P Clinical Course as of 09/06/2022 1505  Sat Sep 10, 2022  1455 Covid, at facility, now hypoxic and altered, follow up labs, admit, has metastatic cancer, getting head CT given hx brain edema [JD]    Clinical Course User Index [JD] Laurence Spates, MD                                 Medical Decision Making Amount and/or Complexity of Data Reviewed Labs: ordered. Radiology: ordered.  Risk Prescription drug management.   77 yo F with a chief complaints of difficulty breathing.  Patient tested positive for COVID earlier in the week and has gotten progressively worse at her skilled nursing facility.  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  Seems more likely that she primarily has COVID but will obtain CT imaging.  Plain film of the chest blood work bag of IV fluids.  She is wheezing on my exam will give 3 DuoNebs back-to-back and steroids.  Mildly better after DuoNeb still quite confused.  Still requiring oxygen.  Will need admission awaiting blood work CT of the head.  Signed out to Dr. Earlene Plater, please see his note for further details care in the ED.  The patients results and plan were reviewed and discussed.   Any x-rays performed were independently reviewed by myself.   Differential diagnosis were considered with the presenting HPI.  Medications  ipratropium-albuterol (DUONEB) 0.5-2.5 (3) MG/3ML nebulizer solution 3 mL (3 mLs Nebulization Given 09/16/2022 1408)  methylPREDNISolone sodium succinate (SOLU-MEDROL) 125 mg/2 mL injection 125 mg (125 mg Intravenous Given 08/22/2022 1324)    Vitals:   09/06/2022 1404 08/23/2022 1408 09/14/2022 1415 09/16/2022 1445  BP:      Pulse:   (!) 116 (!) 112  Resp:   (!) 24 (!) 29  Temp:       TempSrc:      SpO2: 94% 97%  93% 91%    Final diagnoses:  COVID-19 virus infection            Final Clinical Impression(s) / ED Diagnoses Final diagnoses:  COVID-19 virus infection    Rx / DC Orders ED Discharge Orders     None         Melene Plan, DO 08/28/2022 1505

## 2022-09-10 NOTE — Assessment & Plan Note (Signed)
Patient is already on 4 mg dexamethasone twice a day.  Therefore I will just convert this to IV at this time for patient COVID-19 treatment

## 2022-09-10 NOTE — Progress Notes (Signed)
   08/23/2022 2226  BiPAP/CPAP/SIPAP  $ Non-Invasive Ventilator  Non-Invasive Vent Initial  $ Face Mask Medium Yes  BiPAP/CPAP/SIPAP Pt Type Adult  BiPAP/CPAP/SIPAP V60  Mask Type Full face mask  Mask Size Medium  Set Rate 20 breaths/min  Respiratory Rate 30 breaths/min  IPAP 18 cmH20  EPAP 8 cmH2O  FiO2 (%) 80 %  Minute Ventilation 13  Leak 0  Peak Inspiratory Pressure (PIP) 18  Tidal Volume (Vt) 405  Patient Home Equipment No  Auto Titrate No  Press High Alarm 35 cmH2O  Press Low Alarm 5 cmH2O  CPAP/SIPAP surface wiped down Yes  BiPAP/CPAP /SiPAP Vitals  Pulse Rate (!) 144  Resp (!) 33  SpO2 90 %  Bilateral Breath Sounds Diminished  MEWS Score/Color  MEWS Score 5  MEWS Score Color Red

## 2022-09-10 NOTE — Progress Notes (Signed)
Pharmacy Antibiotic Note  Jennifer Cowan is a 77 y.o. female admitted on 09/03/2022.  Pharmacy has been consulted for Unasyn dosing for aspiration pneumonia.    Plan: Unasyn 3g IV q6h Follow up renal function, culture results, and clinical course.    Weight: 61.5 kg (135 lb 9.3 oz)  Temp (24hrs), Avg:99.1 F (37.3 C), Min:98 F (36.7 C), Max:100.4 F (38 C)  Recent Labs  Lab 08/28/2022 1412 09/08/2022 1542  WBC 5.1  --   CREATININE  --  0.46    Estimated Creatinine Clearance: 58.1 mL/min (by C-G formula based on SCr of 0.46 mg/dL).    No Known Allergies  Antimicrobials this admission: 8/24 Unasyn >>   Dose adjustments this admission:   Microbiology results: 8/24 BCx:   Thank you for allowing pharmacy to be a part of this patient's care. Lynann Beaver PharmD, BCPS [facility] main pharmacy [phone] 08/22/2022 9:31 PM

## 2022-09-10 NOTE — Assessment & Plan Note (Addendum)
With marked hypoxemia currently stabilized on 4 L/min of supplementary oxygen but still altered, therefore we will get an ABG.  It is noted however that the chest x-ray is actually looking about the same as it did back in July.  In spite of rather marked change in patient FiO2 requirement.  Also it is noted in the ER exam was notable for marked wheezing, which has currently abated.  With no resolution of patient hypoxemia.  Therefore I will go ahead and get CT chest with contrast.  Start remdesivir and decadron. If any consolidation on CT chest, will start antibacterials at that time.

## 2022-09-10 NOTE — ED Triage Notes (Signed)
BIBA from [facility] for AMS, low O2 saturation. Pt tested positive for Covid on 8/17, and again yesterday.  122/76 BP 130 HR 84% room air, 92% on 6lpm 387 cbg

## 2022-09-10 NOTE — H&P (Addendum)
History and Physical    Patient: Jennifer Cowan ZOX:[id number] DOB: 1945/01/31 DOA: 09/01/2022 DOS: the patient was seen and examined on 09/01/2022 PCP: [name], FNP  Patient coming from:  rehab facility [facility]  Chief Complaint:  Chief Complaint  Patient presents with   Altered Mental Status   HPI: Jennifer Cowan is a 77 y.o. 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 Most recently on immunotherapy. unfortunately patient has had a decline of functional status in the last several weeks including limited ability to ambulate.  Therefore patient was transitioned to rehab facility and her maintenance chemotherapy therapy was interrupted. hyperlipidemia, hypertension, seizure disorder, chronic pain, type 2 diabetes, anemia, arthritis   History is obtained from patient's niece at the bedside Jennifer Cowan as well as from the record review and ER signout.   Patient has had progressive decline of functional status in her rehab facility over the last several weeks.  There has been an outbreak of COVID-19 at the facility.  Review of record indicates that it was diagnosed on September 03, 2022.  Patient had concurrent onset of loss of appetite.  However there is no reported shortness of breath or chest pain or fever.  For the last 48 hours patient has been noted to be coughing by family members although with no expectoration.  And earlier today patient was noted to have marked alteration/depression of mental status.  With further finding of hypoxemia on routine pulse oximetry at the rehab facility.  Patient is therefore transferred to [facility] ER.  Patient has been stabilized on supplementary oxygen and Sun.   In spite of this patient remains fairly apathetic and not interactive meaningfully with history taking.  Medical evaluation is sought  Review of Systems: As mentioned in the history of present illness. All other systems reviewed and are negative.  Review of system is obtained from niece, therefore is of limited accuracy Past Medical History:  Diagnosis Date   Anemia    Arthritis    Breast cancer (HCC)    Cancer (HCC) 1999   Breast Cancer   Family history of breast cancer    Hyperlipidemia    Hypertension    Osteopenia 01/12/2021   Personal history of malignant neoplasm of breast    Past Surgical History:  Procedure Laterality Date   BREAST SURGERY  1999   Masectomy- left   BRONCHIAL BIOPSY  07/30/2021   Procedure: BRONCHIAL BIOPSIES;  Surgeon: Leslye Peer, MD;  Location: [facility] ENDOSCOPY;  Service: Pulmonary;;   BRONCHIAL BRUSHINGS  07/30/2021   Procedure: BRONCHIAL BRUSHINGS;  Surgeon: Leslye Peer, MD;  Location: [facility] ENDOSCOPY;  Service: Pulmonary;;   BRONCHIAL NEEDLE ASPIRATION BIOPSY  07/30/2021   Procedure: BRONCHIAL NEEDLE ASPIRATION BIOPSIES;  Surgeon: Leslye Peer, MD;  Location: [facility] ENDOSCOPY;  Service: Pulmonary;;   CYSTECTOMY     cyst removed off top of head   IR IMAGING GUIDED PORT INSERTION  08/20/2021   MASTECTOMY     MASTECTOMY W/ SENTINEL NODE BIOPSY Right 06/27/2022   Procedure: RIGHT MASTECTOMY WITH SENTINEL LYMPH NODE BIOPSY;  Surgeon: Almond Lint, MD;  Location: [facility] OR;  Service: General;  Laterality: Right;   VIDEO BRONCHOSCOPY WITH RADIAL ENDOBRONCHIAL ULTRASOUND  07/30/2021  Procedure: VIDEO BRONCHOSCOPY WITH RADIAL ENDOBRONCHIAL ULTRASOUND;  Surgeon: Leslye Peer, MD;  Location: [facility] ENDOSCOPY;  Service: Pulmonary;;   Social History:  reports that she has never smoked. She has never used smokeless tobacco. She reports that she does not drink alcohol and does not use drugs.  No Known Allergies  Family History  Problem Relation Age of Onset   Arthritis  Mother    Breast cancer Sister 40   Diabetes Sister    Hyperlipidemia Sister    Hypertension Sister    Stroke Sister    Dementia Sister    Diabetes Brother    Hypertension Brother    Breast cancer Niece 60    Prior to Admission medications   Medication Sig Start Date End Date Taking? Authorizing Provider  acetaminophen (TYLENOL) 500 MG tablet Take 1,000 mg by mouth every 6 (six) hours as needed (for back pain).   Yes [provider]  apixaban (ELIQUIS) 5 MG TABS tablet Take 1 tablet (5 mg total) by mouth 2 (two) times daily. 10 mg twice daily for 4 days, then on 7/28 switch to 5 mg twice daily 08/09/22  Yes Gherghe, Daylene Katayama, MD  atenolol (TENORMIN) 25 MG tablet Take 0.5 tablets (12.5 mg total) by mouth daily. 07/27/22  Yes [name], FNP  atorvastatin (LIPITOR) 80 MG tablet Take 1 tablet (80 mg total) by mouth daily. Patient taking differently: Take 80 mg by mouth at bedtime. 10/08/21  Yes [name], FNP  Cholecalciferol (VITAMIN D3) 125 MCG (5000 UT) CAPS Take 5,000 Units by mouth daily.   Yes [provider]  dexamethasone (DECADRON) 4 MG tablet Take 1 tablet (4 mg total) by mouth 2 (two) times daily. 08/09/22  Yes Leatha Gilding, MD  hydrOXYzine (ATARAX) 10 MG tablet Take 10 mg by mouth 2 (two) times daily as needed for itching.   Yes [provider]  ketoconazole (NIZORAL) 2 % cream Apply 1 Application topically See admin instructions. Apply to both feet 2 times a day 03/15/22  Yes [provider]  levETIRAcetam (KEPPRA) 500 MG tablet Take 1 tablet (500 mg total) by mouth 2 (two) times daily. 06/12/22 09/04/2022 Yes Loetta Rough, MD  lidocaine 4 % Place 2 patches onto the skin daily.   Yes [provider]  metformin (FORTAMET) 500 MG (OSM) 24 hr tablet Take 500 mg by mouth daily.   Yes [provider]  methocarbamol (ROBAXIN) 500 MG tablet Take 250 mg by mouth in the morning and at bedtime.   Yes [provider]  oxyCODONE (OXY IR/ROXICODONE) 5 MG immediate release tablet Take 1-2 tablets (5-10 mg total) by mouth every 4 (four) hours as needed for moderate pain. Patient taking differently: Take 5 mg by mouth in the morning, at noon, and at bedtime. 08/09/22  Yes Gherghe, Daylene Katayama, MD  pantoprazole (PROTONIX) 40 MG tablet Take 1 tablet (40 mg total) by mouth daily. 08/10/22  Yes Gherghe, Daylene Katayama, MD  QUEtiapine (SEROQUEL) 25 MG tablet Take 0.5 tablets (12.5 mg total) by mouth 2 (two) times daily. Patient taking differently: Take 25 mg by mouth 2 (two) times daily. 08/09/22  Yes Leatha Gilding, MD  traZODone (DESYREL) 50 MG tablet Take 50 mg by mouth at bedtime.   Yes [provider]  Accu-Chek Softclix Lancets lancets Use to check blood sugar as directed 4 times a day 02/14/22   Pickenpack-Cousar, Arty Baumgartner, NP  blood glucose meter kit and supplies KIT Dispense  based on patient and insurance preference. Use up to four times daily as directed. 08/31/21   Loa Socks, NP  Blood Glucose Monitoring Suppl (ACCU-CHEK GUIDE) w/Device KIT Use as directed 4 times a day 08/31/21   Loa Socks, NP  glucose blood (ACCU-CHEK GUIDE) test strip Use as directed 4 times a day 01/26/22   Pickenpack-Cousar, Arty Baumgartner, NP  lidocaine-prilocaine (EMLA) cream Apply 1 Application topically as needed [facility] access).    [provider]    Physical Exam: Vitals:   08/31/2022 1520 09/14/2022 1600 09/12/2022 1700 09/02/2022 1704  BP: 109/69 106/79 115/73   Pulse: (!) 110 (!) 108 (!) 117 (!) 119  Resp: (!) 26 (!) 26 (!) 28 (!) 24  Temp:    98 F (36.7 C)  TempSrc:      SpO2: 91% 90% (!) 89% 91%   General: Patient keeps eyes closed, only opening transiently with very transient eye contact.  Patient does not follow directions and gives at the most 1-2 word responses in a soft voice.  Seems to be asleep/somnolent does not appear to be immediately distressed.  Respiratory exam: Bilateral  intravesicular Cardiovascular exam S1-S2 normal Abdomen all quadrants soft nontender Extremities warm without edema.  However patient does not follow directions with upper extremity, nausea stimulation to suggest that there is no gross motor palsy of any extremity Data Reviewed:  Labs on Admission:  Results for orders placed or performed during the hospital encounter of 09/04/2022 (from the past 24 hour(s))  CBC with Differential     Status: Abnormal   Collection Time: 08/31/2022  2:12 PM  Result Value Ref Range   WBC 5.1 4.0 - 10.5 K/uL   RBC 4.02 3.87 - 5.11 MIL/uL   Hemoglobin 12.7 12.0 - 15.0 g/dL   HCT 16.1 09.6 - 04.5 %   MCV 105.2 (H) 80.0 - 100.0 fL   MCH 31.6 26.0 - 34.0 pg   MCHC 30.0 30.0 - 36.0 g/dL   RDW 40.9 (H) 81.1 - 91.4 %   Platelets 169 150 - 400 K/uL   nRBC 0.0 0.0 - 0.2 %   Neutrophils Relative % 87 %   Neutro Abs 4.5 1.7 - 7.7 K/uL   Lymphocytes Relative 3 %   Lymphs Abs 0.1 (L) 0.7 - 4.0 K/uL   Monocytes Relative 7 %   Monocytes Absolute 0.3 0.1 - 1.0 K/uL   Eosinophils Relative 0 %   Eosinophils Absolute 0.0 0.0 - 0.5 K/uL   Basophils Relative 2 %   Basophils Absolute 0.1 0.0 - 0.1 K/uL   Immature Granulocytes 1 %   Abs Immature Granulocytes 0.04 0.00 - 0.07 K/uL  Brain natriuretic peptide     Status: Abnormal   Collection Time: 09/14/2022  2:12 PM  Result Value Ref Range   B Natriuretic Peptide 319.6 (H) 0.0 - 100.0 pg/mL  Blood gas, venous (at [facility] and [facility])     Status: Abnormal   Collection Time: 08/25/2022  2:33 PM  Result Value Ref Range   pH, Ven 7.42 7.25 - 7.43   pCO2, Ven 37 (L) 44 - 60 mmHg   pO2, Ven 73 (H) 32 - 45 mmHg   Bicarbonate 24.0 20.0 - 28.0 mmol/L   Acid-base deficit 0.2 0.0 - 2.0 mmol/L   O2 Saturation 96.5 %   Patient temperature 37.0   Comprehensive metabolic panel     Status: Abnormal   Collection Time: 09/02/2022  3:42 PM  Result Value Ref Range  Sodium 140 135 - 145 mmol/L   Potassium 3.7 3.5 - 5.1 mmol/L   Chloride 109 98 -  111 mmol/L   CO2 21 (L) 22 - 32 mmol/L   Glucose, Bld 395 (H) 70 - 99 mg/dL   BUN 19 8 - 23 mg/dL   Creatinine, Ser 1.02 0.44 - 1.00 mg/dL   Calcium 8.9 8.9 - 72.5 mg/dL   Total Protein 5.9 (L) 6.5 - 8.1 g/dL   Albumin 2.4 (L) 3.5 - 5.0 g/dL   AST 14 (L) 15 - 41 U/L   ALT 31 0 - 44 U/L   Alkaline Phosphatase 73 38 - 126 U/L   Total Bilirubin 1.0 0.3 - 1.2 mg/dL   GFR, Estimated >36 >64 mL/min   Anion gap 10 5 - 15  Troponin I (High Sensitivity)     Status: Abnormal   Collection Time: 09/16/2022  3:43 PM  Result Value Ref Range   Troponin I (High Sensitivity) 313 (HH) <18 ng/L   Basic Metabolic Panel: Recent Labs  Lab 09/03/2022 1542  NA 140  K 3.7  CL 109  CO2 21*  GLUCOSE 395*  BUN 19  CREATININE 0.46  CALCIUM 8.9   Liver Function Tests: Recent Labs  Lab 08/25/2022 1542  AST 14*  ALT 31  ALKPHOS 73  BILITOT 1.0  PROT 5.9*  ALBUMIN 2.4*   No results for input(s): "LIPASE", "AMYLASE" in the last 168 hours. No results for input(s): "AMMONIA" in the last 168 hours. CBC: Recent Labs  Lab 09/15/2022 1412  WBC 5.1  NEUTROABS 4.5  HGB 12.7  HCT 42.3  MCV 105.2*  PLT 169   Cardiac Enzymes: Recent Labs  Lab 09/09/2022 1543  TROPONINIHS 313*    BNP (last 3 results) No results for input(s): "PROBNP" in the last 8760 hours. CBG: No results for input(s): "GLUCAP" in the last 168 hours.  Radiological Exams on Admission:  CT Head Wo Contrast  Result Date: 09/07/2022 CLINICAL DATA:  Altered mental status. EXAM: CT HEAD WITHOUT CONTRAST TECHNIQUE: Contiguous axial images were obtained from the base of the skull through the vertex without intravenous contrast. RADIATION DOSE REDUCTION: This exam was performed according to the departmental dose-optimization program which includes automated exposure control, adjustment of the mA and/or kV according to patient size and/or use of iterative reconstruction technique. COMPARISON:  Head CT 08/07/2022, additional priors reviewed.  FINDINGS: Brain: Sequela of intracranial metastatic disease with edema in the right cerebral hemisphere centered around a posterior temporal mass, overall mild improvement in cerebral edema from prior. Edema in the posterior left frontal lobe, unchanged or minimally worsened. No hemorrhage. No midline shift. No hydrocephalus. No subdural or extra-axial collection. Vascular: Atherosclerosis of skullbase vasculature without hyperdense vessel or abnormal calcification. Skull: No calvarial lesion or fracture. Sinuses/Orbits: No acute finding. Other: None. IMPRESSION: 1. Known intracranial metastatic disease. Slight improvement in right cerebral edema, left frontal lobe edema is unchanged or minimally progressed. 2. No hemorrhage or midline shift. Electronically Signed   By: Narda Rutherford M.D.   On: 08/26/2022 15:49   DG Chest Port 1 View  Result Date: 08/29/2022 CLINICAL DATA:  COVID positive.  Hypoxia EXAM: PORTABLE CHEST 1 VIEW COMPARISON:  X-ray 07/31/2022.  CT 08/04/2022 FINDINGS: Normal cardiopericardial silhouette. No consolidation, pneumothorax, effusion or edema. Persistent left perihilar, suprahilar opacity with pleural thickening and retraction. Please correlate with history. Right IJ chest port with tip along the central SVC above the right atrium. Overlapping cardiac leads. Surgical clips in the right  axillary region. IMPRESSION: No significant interval change. Stable left perihilar and upper lung opacity with surgical changes and chest port Electronically Signed   By: Karen Kays M.D.   On: 09/04/2022 14:01    EKG: Independently reviewed. Sinus tach. No ST-T wave changes.   Assessment and Plan: * COVID-19 With marked hypoxemia currently stabilized on 4 L/min of supplementary oxygen but still altered, therefore we will get an ABG.  It is noted however that the chest x-ray is actually looking about the same as it did back in July.  In spite of rather marked change in patient FiO2 requirement.   Also it is noted in the ER exam was notable for marked wheezing, which has currently abated.  With no resolution of patient hypoxemia.  Therefore I will go ahead and get CT chest with contrast.  Start remdesivir and decadron. If any consolidation on CT chest, will start antibacterials at that time.  Metastasis to brain [facility]) Patient is already on 4 mg dexamethasone twice a day.  Therefore I will just convert this to IV at this time for patient COVID-19 treatment  Pulmonary embolism [facility]) This was diagnosed back in July.  At this time I will continue patient apixaban  Delirium CT head shows multiple metastases.  With an additional stressor at this time of acute hypoxemia.  I think this adequately explains patient's current delirium.  Exam is nonfocal.  We will continue with clinical monitoring at this time and see how patient responds to reversal of her metabolic stressors.  Change oxycodone to as needed.  Hold trazodone and quetiapine  Troponin I above reference range This is most consistent with hypoxemia related troponin leak.  Acute coronary syndrome is highly unlikely with this presentation.  Repeat troponin level is pending  Hyperglycemia In the setting of underlying diabetes mellitus now status post methylprednisolone in the ER.  I was managed with insulin sliding scale.  Hold metformin      Advance Care Planning:   Code Status: DNR this was discussed with patient's niece at the bedside who is also the healthcare proxy.  She advised that the patient would not want to be resuscitated or intubated in her current situation when her recent functional decline independent of her COVID-19 disease..  We will obviously respect patient's wishes  Consults: none at this time.  Family Communication: niece at bedside.  Severity of Illness: The appropriate patient status for this patient is INPATIENT. Inpatient status is judged to be reasonable and necessary in order to provide the required  intensity of service to ensure the patient's safety. The patient's presenting symptoms, physical exam findings, and initial radiographic and laboratory data in the context of their chronic comorbidities is felt to place them at high risk for further clinical deterioration. Furthermore, it is not anticipated that the patient will be medically stable for discharge from the hospital within 2 midnights of admission.   * I certify that at the point of admission it is my clinical judgment that the patient will require inpatient hospital care spanning beyond 2 midnights from the point of admission due to high intensity of service, high risk for further deterioration and high frequency of surveillance required.*  Author: Nolberto Hanlon, MD 08/26/2022 5:17 PM  For on call review [email or web address].

## 2022-09-10 NOTE — Assessment & Plan Note (Signed)
This was diagnosed back in July.  At this time I will continue patient apixaban

## 2022-09-10 NOTE — ED Notes (Signed)
Multiple attempts to obtain blood cultures were unsuccessful.

## 2022-09-11 DIAGNOSIS — U071 COVID-19: Secondary | ICD-10-CM

## 2022-09-11 LAB — CBC
HCT: 37.4 % (ref 36.0–46.0)
Hemoglobin: 11.4 g/dL — ABNORMAL LOW (ref 12.0–15.0)
MCH: 31.4 pg (ref 26.0–34.0)
MCHC: 30.5 g/dL (ref 30.0–36.0)
MCV: 103 fL — ABNORMAL HIGH (ref 80.0–100.0)
Platelets: 137 10*3/uL — ABNORMAL LOW (ref 150–400)
RBC: 3.63 MIL/uL — ABNORMAL LOW (ref 3.87–5.11)
RDW: 16.8 % — ABNORMAL HIGH (ref 11.5–15.5)
WBC: 4.5 10*3/uL (ref 4.0–10.5)
nRBC: 0.7 % — ABNORMAL HIGH (ref 0.0–0.2)

## 2022-09-11 LAB — LACTIC ACID, PLASMA
Lactic Acid, Venous: 5 mmol/L (ref 0.5–1.9)
Lactic Acid, Venous: 3.8 mmol/L (ref 0.5–1.9)

## 2022-09-11 LAB — BLOOD GAS, ARTERIAL
Acid-base deficit: 0.1 mmol/L (ref 0.0–2.0)
Bicarbonate: 23.9 mmol/L (ref 20.0–28.0)
Drawn by: 560031
O2 Content: 6 L/min
O2 Saturation: 92.9 %
Patient temperature: 37
pCO2 arterial: 36 mmHg (ref 32–48)
pH, Arterial: 7.43 (ref 7.35–7.45)
pO2, Arterial: 62 mmHg — ABNORMAL LOW (ref 83–108)

## 2022-09-11 LAB — BASIC METABOLIC PANEL
Anion gap: 9 (ref 5–15)
BUN: 25 mg/dL — ABNORMAL HIGH (ref 8–23)
CO2: 19 mmol/L — ABNORMAL LOW (ref 22–32)
Calcium: 8.3 mg/dL — ABNORMAL LOW (ref 8.9–10.3)
Chloride: 114 mmol/L — ABNORMAL HIGH (ref 98–111)
Creatinine, Ser: 1.09 mg/dL — ABNORMAL HIGH (ref 0.44–1.00)
GFR, Estimated: 53 mL/min — ABNORMAL LOW (ref >60)
Glucose, Bld: 217 mg/dL — ABNORMAL HIGH (ref 70–99)
Potassium: 3.7 mmol/L (ref 3.5–5.1)
Sodium: 142 mmol/L (ref 135–145)

## 2022-09-11 LAB — BLOOD GAS, VENOUS
Acid-base deficit: 5.5 mmol/L — ABNORMAL HIGH (ref 0.0–2.0)
Bicarbonate: 17.7 mmol/L — ABNORMAL LOW (ref 20.0–28.0)
O2 Saturation: 98 %
Patient temperature: 36.1
pCO2, Ven: 27 mmHg — ABNORMAL LOW (ref 44–60)
pH, Ven: 7.42 (ref 7.25–7.43)
pO2, Ven: 174 mmHg — ABNORMAL HIGH (ref 32–45)

## 2022-09-11 LAB — PROTIME-INR
INR: 2.4 — ABNORMAL HIGH (ref 0.8–1.2)
Prothrombin Time: 26.4 s — ABNORMAL HIGH (ref 11.4–15.2)

## 2022-09-11 LAB — GLUCOSE, CAPILLARY
Glucose-Capillary: 434 mg/dL — ABNORMAL HIGH (ref 70–99)
Glucose-Capillary: 435 mg/dL — ABNORMAL HIGH (ref 70–99)

## 2022-09-11 LAB — APTT: aPTT: 43 s — ABNORMAL HIGH (ref 24–36)

## 2022-09-11 MED ORDER — ORAL CARE MOUTH RINSE: 15.0000 mL | OROMUCOSAL | Status: DC | PRN

## 2022-09-11 MED ORDER — SODIUM CHLORIDE 0.9 % IV BOLUS
1000.0000 mL | Freq: Once | INTRAVENOUS | Status: AC
  Administered 2022-09-11: 1000 mL via INTRAVENOUS

## 2022-09-11 MED ORDER — VANCOMYCIN HCL 1250 MG/250ML IV SOLN: 1250.0000 mg | INTRAVENOUS | Status: DC

## 2022-09-11 MED ORDER — NOREPINEPHRINE 4 MG/250ML-% IV SOLN
0.0000 ug/min | INTRAVENOUS | Status: DC
  Administered 2022-09-11: 2 ug/min via INTRAVENOUS
  Filled 2022-09-11: qty 250

## 2022-09-11 MED ORDER — SODIUM CHLORIDE 0.9 % IV SOLN: INTRAVENOUS | Status: DC

## 2022-09-11 MED ORDER — GLYCOPYRROLATE 1 MG PO TABS: 1.0000 mg | ORAL_TABLET | ORAL | Status: DC | PRN

## 2022-09-11 MED ORDER — SODIUM CHLORIDE 0.9 % IV BOLUS
500.0000 mL | Freq: Once | INTRAVENOUS | Status: AC
  Administered 2022-09-11: 500 mL via INTRAVENOUS

## 2022-09-11 MED ORDER — ACETAMINOPHEN 650 MG RE SUPP: 650.0000 mg | Freq: Four times a day (QID) | RECTAL | Status: DC | PRN

## 2022-09-11 MED ORDER — GLYCOPYRROLATE 0.2 MG/ML IJ SOLN: 0.2000 mg | INTRAMUSCULAR | Status: DC | PRN

## 2022-09-11 MED ORDER — VANCOMYCIN HCL IN DEXTROSE 1-5 GM/200ML-% IV SOLN
1000.0000 mg | Freq: Once | INTRAVENOUS | Status: AC
  Administered 2022-09-11: 1000 mg via INTRAVENOUS
  Filled 2022-09-11: qty 200

## 2022-09-11 MED ORDER — ALBUMIN HUMAN 25 % IV SOLN
50.0000 g | Freq: Once | INTRAVENOUS | Status: AC
  Administered 2022-09-11: 50 g via INTRAVENOUS
  Filled 2022-09-11: qty 200

## 2022-09-11 MED ORDER — MORPHINE BOLUS VIA INFUSION: 5.0000 mg | INTRAVENOUS | Status: DC | PRN

## 2022-09-11 MED ORDER — VANCOMYCIN HCL IN DEXTROSE 1-5 GM/200ML-% IV SOLN: 1000.0000 mg | INTRAVENOUS | Status: DC

## 2022-09-11 MED ORDER — SODIUM CHLORIDE 0.9 % IV SOLN
2.0000 g | Freq: Two times a day (BID) | INTRAVENOUS | Status: DC
  Administered 2022-09-11: 2 g via INTRAVENOUS
  Filled 2022-09-11: qty 12.5

## 2022-09-11 MED ORDER — HALOPERIDOL LACTATE 5 MG/ML IJ SOLN: 2.5000 mg | INTRAMUSCULAR | Status: DC | PRN

## 2022-09-11 MED ORDER — INSULIN GLARGINE-YFGN 100 UNIT/ML ~~LOC~~ SOLN
10.0000 [IU] | Freq: Every day | SUBCUTANEOUS | Status: DC
  Administered 2022-09-11: 10 [IU] via SUBCUTANEOUS
  Filled 2022-09-11: qty 0.1

## 2022-09-11 MED ORDER — MORPHINE 100MG IN NS 100ML (1MG/ML) PREMIX INFUSION
0.0000 mg/h | INTRAVENOUS | Status: DC
  Administered 2022-09-11: 5 mg/h via INTRAVENOUS
  Filled 2022-09-11: qty 100

## 2022-09-11 MED ORDER — METRONIDAZOLE 500 MG/100ML IV SOLN
500.0000 mg | Freq: Two times a day (BID) | INTRAVENOUS | Status: DC
  Administered 2022-09-11: 500 mg via INTRAVENOUS
  Filled 2022-09-11: qty 100

## 2022-09-11 MED ORDER — ACETAMINOPHEN 325 MG PO TABS: 650.0000 mg | ORAL_TABLET | Freq: Four times a day (QID) | ORAL | Status: DC | PRN

## 2022-09-11 MED ORDER — POLYVINYL ALCOHOL 1.4 % OP SOLN: 1.0000 [drp] | Freq: Four times a day (QID) | OPHTHALMIC | Status: DC | PRN

## 2022-09-12 ENCOUNTER — Other Ambulatory Visit: Payer: 59

## 2022-09-12 ENCOUNTER — Ambulatory Visit: Payer: 59

## 2022-09-12 ENCOUNTER — Ambulatory Visit: Payer: 59 | Admitting: Adult Health

## 2022-09-12 LAB — BLOOD CULTURE ID PANEL (REFLEXED) - BCID2
A.calcoaceticus-baumannii: NOT DETECTED
Bacteroides fragilis: NOT DETECTED
Candida albicans: NOT DETECTED
Candida auris: NOT DETECTED
Candida glabrata: NOT DETECTED
Candida krusei: NOT DETECTED
Candida parapsilosis: NOT DETECTED
Candida tropicalis: NOT DETECTED
Cryptococcus neoformans/gattii: NOT DETECTED
Enterobacter cloacae complex: NOT DETECTED
Enterobacterales: NOT DETECTED
Enterococcus Faecium: NOT DETECTED
Enterococcus faecalis: NOT DETECTED
Escherichia coli: NOT DETECTED
Haemophilus influenzae: NOT DETECTED
Klebsiella aerogenes: NOT DETECTED
Klebsiella oxytoca: NOT DETECTED
Klebsiella pneumoniae: NOT DETECTED
Listeria monocytogenes: NOT DETECTED
Meth resistant mecA/C and MREJ: NOT DETECTED
Neisseria meningitidis: NOT DETECTED
Proteus species: NOT DETECTED
Pseudomonas aeruginosa: NOT DETECTED
Salmonella species: NOT DETECTED
Serratia marcescens: NOT DETECTED
Staphylococcus epidermidis: NOT DETECTED
Staphylococcus lugdunensis: NOT DETECTED
Staphylococcus species: DETECTED — AB
Stenotrophomonas maltophilia: NOT DETECTED
Streptococcus agalactiae: NOT DETECTED
Streptococcus pneumoniae: NOT DETECTED
Streptococcus pyogenes: NOT DETECTED
Streptococcus species: NOT DETECTED

## 2022-09-13 ENCOUNTER — Ambulatory Visit: Payer: 59 | Admitting: Adult Health

## 2022-09-13 LAB — CULTURE, BLOOD (ROUTINE X 2)

## 2022-09-15 ENCOUNTER — Other Ambulatory Visit: Payer: Self-pay | Admitting: Family

## 2022-09-15 DIAGNOSIS — M159 Polyosteoarthritis, unspecified: Secondary | ICD-10-CM

## 2022-09-18 NOTE — Death Summary Note (Signed)
DEATH SUMMARY   Patient Details  Name: Jennifer Cowan MRN: [id number] DOB: 26-Nov-1945 MVH:QIONGE, Birder Robson, FNP Admission/Discharge Information   Admit Date:  Sep 23, 2022  Date of Death: Date of Death: 09-24-2022  Time of Death: Time of Death: 1304-09-30  Length of Stay: 1   Hospital Diagnoses: Principal Problem:   COVID-19 Active Problems:   Metastasis to brain [facility])   Hyperglycemia   Troponin I above reference range   Delirium   Pulmonary embolism [facility])  Hospital Course: Jennifer Cowan was a 77 y.o. female with PMH significant for breast cancer, left upper lobe NSCLC with brain mets, DM2, HTN, HLD, seizure disorder, arthritis, pulm embolism 07/2022, chronic anemia who has had recent recurrence of cancer and progressive decline in her functional capacity and was since transition to a rehab facility several weeks ago 8/17, patient was diagnosed with COVID-19.  Apparently there was an outbreak of COVID at the facility. For the past 2 days prior to presentation, patient did opt shortness of breath, cough and progressive lethargy.  She was noted to have low oxygen level and hence sent to the ED   In the ED, patient had a fever of 100.4, tachycardic persistently, tachypneic to 20s required 6 L oxygen by nasal cannula. She was noted to be positive for COVID-19 Admitted to [facility]  Acute issues on admission COVID-19 pneumonia Acute metabolic encephalopathy Recent pulm embolism Lung cancer with brain mets  Overnight, patient's respiratory status worsened.  She needed to be on BiPAP. 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  Also discussed with critical care attending who discussed the case with  family later. Family reported that patient did not want to be in SNF at the first place.  She previously expressed that she was tired and felt ' ready to go' and calling for her deceased mother.  Family chose withdrawal of aggressive care and initiation of comfort care.  Patient was subsequently switched to comfort care. Within few hours of the transition, patient expired.  Time of death 30 On 09-24-22 Principal cause of death: Septic shock due to COVID-pneumonia in the setting of lung cancer       Procedures:   Consultations:   The results of significant diagnostics from this hospitalization (including imaging, microbiology, ancillary and laboratory) are listed below for reference.   Significant Diagnostic Studies: DG CHEST PORT 1 VIEW  Result Date: 2022/09/23 CLINICAL DATA:  20808 with hypoxia, COVID. EXAM: PORTABLE CHEST 1 VIEW COMPARISON:  CT chest 08/04/2022, portable chest earlier today at 1:25 p.m. FINDINGS: 9:32 p.m. Right IJ port catheter tip remains at the superior cavoatrial junction. There are bilateral axillary surgical clips, and opacity in the medial left upper lobe most likely reflecting XRT fibrosis. No new lung opacity is seen. There are low lung volumes and scattered linear atelectasis in the hypoexpanded bases. No substantial pleural effusions evident. The mediastinal configuration is stable. The cardiac size is normal. No new osseous findings. Mild chronic elevation right diaphragm. IMPRESSION: 1. Low-inspiration study with scattered bibasilar atelectasis. No new lung opacity. 2. 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 Underlying infectious process or mass difficult to exclude but the contours of this have not changed from recent studies. Electronically  Signed   By: Almira Bar M.D.   On: 09/02/2022 21:48   CT Head Wo Contrast  Result Date: 08/26/2022 CLINICAL DATA:  Altered mental status. EXAM: CT HEAD WITHOUT CONTRAST TECHNIQUE: Contiguous axial  images were obtained from the base of the skull through the vertex without intravenous contrast. RADIATION DOSE REDUCTION: This exam was performed according to the departmental dose-optimization program which includes automated exposure control, adjustment of the mA and/or kV according to patient size and/or use of iterative reconstruction technique. COMPARISON:  Head CT 08/07/2022, additional priors reviewed. FINDINGS: Brain: Sequela of intracranial metastatic disease with edema in the right cerebral hemisphere centered around a posterior temporal mass, overall mild improvement in cerebral edema from prior. Edema in the posterior left frontal lobe, unchanged or minimally worsened. No hemorrhage. No midline shift. No hydrocephalus. No subdural or extra-axial collection. Vascular: Atherosclerosis of skullbase vasculature without hyperdense vessel or abnormal calcification. Skull: No calvarial lesion or fracture. Sinuses/Orbits: No acute finding. Other: None. IMPRESSION: 1. Known intracranial metastatic disease. Slight improvement in right cerebral edema, left frontal lobe edema is unchanged or minimally progressed. 2. No hemorrhage or midline shift. Electronically Signed   By: Narda Rutherford M.D.   On: 09/14/2022 15:49   DG Chest Port 1 View  Result Date: 09/02/2022 CLINICAL DATA:  COVID positive.  Hypoxia EXAM: PORTABLE CHEST 1 VIEW COMPARISON:  X-ray 07/31/2022.  CT 08/04/2022 FINDINGS: Normal cardiopericardial silhouette. No consolidation, pneumothorax, effusion or edema. Persistent left perihilar, suprahilar opacity with pleural thickening and retraction. Please correlate with history. Right IJ chest port with tip along the central SVC above the right atrium. Overlapping cardiac leads. Surgical clips in the right axillary region. IMPRESSION: No significant interval change. Stable left perihilar and upper lung opacity with surgical changes and chest port Electronically Signed   By: Karen Kays M.D.   On:  09/08/2022 14:01    Microbiology: Recent Results (from the past 240 hour(s))  Blood culture (routine x 2)     Status: None (Preliminary result)   Collection Time: 08/22/2022  2:33 PM   Specimen: BLOOD  Result Value Ref Range Status   Specimen Description   Final    BLOOD LEFT ANTECUBITAL Performed at [facility], [address], [place], [place] [address]    Special Requests   Final    BOTTLES DRAWN AEROBIC AND ANAEROBIC Blood Culture results may not be optimal due to an excessive volume of blood received in culture bottles Performed at [facility], [address] [place], [place] [address]    Culture  Setup Time   Final    GRAM POSITIVE RODS ANAEROBIC BOTTLE ONLY Gram Stain Report Called to,Read Back By and Verified With: PHARMD E. JACKSON 08/21/2022 @ 0620 BY AB GRAM POSITIVE COCCI AEROBIC BOTTLE ONLY Organism ID to follow Performed at [facility], [address], [place], [place] [address]    Culture GRAM POSITIVE RODS [facility] POSITIVE COCCI   Final   Report Status PENDING  Incomplete    Time spent: 35 minutes  Signed: Lorin Glass, MD 08/31/2022

## 2022-09-18 NOTE — Progress Notes (Signed)
PHARMACY - PHYSICIAN COMMUNICATION CRITICAL VALUE ALERT - BLOOD CULTURE IDENTIFICATION (BCID)  Jennifer Cowan is an 77 y.o. female who presented to [facility] on 08/29/2022 with a chief complaint of covid  Assessment:  1/2 gram +  rods  Name of physician (or Provider) Contacted:   Current antibiotics: vanc. Cefepime, flagyl  Changes to prescribed antibiotics recommended:  None probable contaminant  No results found for this or any previous visit.  Arley Phenix RPh 08/26/2022, 6:21 AM

## 2022-09-18 NOTE — Progress Notes (Signed)
   09/01/2022 0316  BiPAP/CPAP/SIPAP  BiPAP/CPAP/SIPAP Pt Type Adult  BiPAP/CPAP/SIPAP V60  Mask Type Full face mask  Mask Size Medium  Set Rate 20 breaths/min  Respiratory Rate 32 breaths/min  IPAP 18 cmH20  EPAP 8 cmH2O  FiO2 (%) 80 %  Minute Ventilation 17  Leak 0  Peak Inspiratory Pressure (PIP) 16  Tidal Volume (Vt) 581  Patient Home Equipment No  Auto Titrate No  Press High Alarm 35 cmH2O  Press Low Alarm 5 cmH2O  BiPAP/CPAP /SiPAP Vitals  Pulse Rate (!) 138  Resp (!) 32  BP (!) 82/47  SpO2 97 %  Bilateral Breath Sounds Diminished;Expiratory wheezes  MEWS Score/Color  MEWS Score 7  MEWS Score Color Red

## 2022-09-18 NOTE — Consult Note (Signed)
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 Worsening functional decline at home requiring rehab facility. Unfortunately covid outbreak occurred at facility including patient being diagnosed on 09/03/22. In the last 2 days worsening cough and altered mental status. Transferred to [facility] for new onset hypoxemia. Initially required 6L O2 and now on BiPAP for worsening hypoxemia. Patient is DNR/DNI. Early this AM began having systolic pressures in the 80s and given IV resuscitation and abx broadened. Transferred to ICU for consideration of short term pressor support.  CT head with known intracranial met, sligh improvement in right cerebral edema, left frontal lobe edema, no midline shift CXR with bibasilar atelectasis, unchanged LUL opacity likely related to radiation fibrosis  Pertinent  Medical History  HLD, HTN, seizure disorder, chronic pain, DM2, anemia, arthritis  Significant Hospital Events: Including procedures, antibiotic start and stop dates in addition to other pertinent events     Interim History / Subjective:  Started on levophed 5 this am Family considering comfort care  Objective   Blood pressure (!) 93/46, pulse (!) 122, temperature 99.7 F (37.6 C), temperature source Axillary, resp. rate (!) 27, weight 66 kg, SpO2 95%.    FiO2 (%):  [60 %-80 %] 60 %   Intake/Output Summary (Last 24 hours) at 08/26/2022 0817 Last data filed at 08/27/2022 7829 Gross per 24 hour  Intake 4013.37 ml  Output --  Net 4013.37 ml   Filed Weights   08/24/2022 1834 09/04/2022 2230  Weight: 61.5 kg 66 kg    Physical Exam: General: Critically ill-appearing, encephalopathic HENT: [place], AT, BiPAP in  place Eyes: EOMI, no scleral icterus Respiratory: Tachypneic Cardiovascular: Tachycardic, RR, -M/R/G, no JVD Neuro: Opens eyes, encephalopathic GU: Foley in place  Resolved Hospital Problem list   N/A  Assessment & Plan:  Acute encephalopathy secondary to sepsis Acute hypoxemic respiratory failure secondary to COVID pneumonia Known radiation fibrosis of left lung Septic shock secondary to covid pneumonia Lactic acidosis Recent pulmonary embolism on eliquis DM2 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 Patient did not want to be in SNF. Patient previously expressed she was tired and "ready to go" and calling for her deceased mother. Family aware of her long journey and battle with cancer and acknowledges she has had significant failure to thrive in the last two months. Discussed options including continued monitoring and medical support on low dose pressors vs withdrawal/comfort care. After addressing questions and concerns, will pursue comfort care. Will update code status and initiate morphine gtt for comfort. Primary team attending notified.  Best Practice (right click and "Reselect all SmartList Selections" daily)   Diet/type: NPO DVT prophylaxis: DOAC GI prophylaxis: N/A Lines: N/A Foley:  N/A Code Status:  DNR Last date of multidisciplinary goals of care discussion [DNR]  Labs   CBC: Recent Labs  Lab 09/17/2022 1412 08/19/2022 0427  WBC 5.1 4.5  NEUTROABS 4.5  --   HGB 12.7 11.4*  HCT 42.3 37.4  MCV 105.2* 103.0*  PLT 169 137*    Basic Metabolic Panel: Recent Labs  Lab 08/22/2022 1542 08/28/2022 0427  NA 140 142  K 3.7 3.7  CL 109 114*  CO2 21* 19*  GLUCOSE 395* 217*  BUN 19 25*  CREATININE 0.46 1.09*  CALCIUM 8.9 8.3*   GFR: Estimated Creatinine Clearance: 44.3 mL/min (A)  (by C-G formula based on SCr of 1.09 mg/dL (H)). Recent Labs  Lab 09/17/2022 1412 09/07/2022 0139 09/07/2022 0427  WBC 5.1  --  4.5  LATICACIDVEN  --  5.0* 3.8*    Liver Function Tests: Recent Labs  Lab 09/08/2022 1542  AST 14*  ALT 31  ALKPHOS 73  BILITOT 1.0  PROT 5.9*  ALBUMIN 2.4*   No results for input(s): "LIPASE", "AMYLASE" in the last 168 hours. No results for input(s): "AMMONIA" in the last 168 hours.  ABG    Component Value Date/Time   PHART 7.43 08/30/2022 1733   PCO2ART 36 08/27/2022 1733   PO2ART 62 (L) 08/25/2022 1733   HCO3 17.7 (L) 09/15/2022 0242   ACIDBASEDEF 5.5 (H) 09/17/2022 0242   O2SAT 98 09/10/2022 0242     Coagulation Profile: Recent Labs  Lab 08/30/2022 0427  INR 2.4*    Cardiac Enzymes: No results for input(s): "CKTOTAL", "CKMB", "CKMBINDEX", "TROPONINI" in the last 168 hours.  HbA1C: HB A1C (BAYER DCA - WAIVED)  Date/Time Value Ref Range Status  04/08/2022 11:13 AM 5.6 4.8 - 5.6 % Final    Comment:             Prediabetes: 5.7 - 6.4          Diabetes: >6.4          Glycemic control for adults with diabetes: <7.0   01/06/2022 11:00 AM 5.6 4.8 - 5.6 % Final    Comment:             Prediabetes: 5.7 - 6.4          Diabetes: >6.4          Glycemic control for adults with diabetes: <7.0    Hgb A1c MFr Bld  Date/Time Value Ref Range Status  08/01/2022 12:32 AM 6.2 (H) 4.8 - 5.6 % Final    Comment:    (NOTE)         Prediabetes: 5.7 - 6.4         Diabetes: >6.4         Glycemic control for adults with diabetes: <7.0     CBG: Recent Labs  Lab 09/07/2022 1744 09/16/2022 2137 09/08/2022 0744 08/31/2022 0814  GLUCAP 354* 281* 434* 435*    Review of Systems:   Unable to obtain due to critical illness  Past Medical History:  She,  has a past medical history of Anemia, Arthritis, Breast cancer (HCC), Cancer (HCC) (1999), Family history of breast cancer, Hyperlipidemia, Hypertension, Osteopenia (01/12/2021), and Personal history of  malignant neoplasm of breast.   Surgical History:   Past Surgical History:  Procedure Laterality Date   BREAST SURGERY  1999   Masectomy- left   BRONCHIAL BIOPSY  07/30/2021   Procedure: BRONCHIAL BIOPSIES;  Surgeon: Leslye Peer, MD;  Location: [facility] ENDOSCOPY;  Service: Pulmonary;;   BRONCHIAL BRUSHINGS  07/30/2021   Procedure: BRONCHIAL BRUSHINGS;  Surgeon: Leslye Peer, MD;  Location: [facility] ENDOSCOPY;  Service: Pulmonary;;   BRONCHIAL NEEDLE ASPIRATION BIOPSY  07/30/2021   Procedure: BRONCHIAL NEEDLE ASPIRATION BIOPSIES;  Surgeon: Leslye Peer, MD;  Location: [facility] ENDOSCOPY;  Service: Pulmonary;;   CYSTECTOMY     cyst removed off top of  head   IR IMAGING GUIDED PORT INSERTION  08/20/2021   MASTECTOMY     MASTECTOMY W/ SENTINEL NODE BIOPSY Right 06/27/2022   Procedure: RIGHT MASTECTOMY WITH SENTINEL LYMPH NODE BIOPSY;  Surgeon: Almond Lint, MD;  Location: [facility] OR;  Service: General;  Laterality: Right;   VIDEO BRONCHOSCOPY WITH RADIAL ENDOBRONCHIAL ULTRASOUND  07/30/2021   Procedure: VIDEO BRONCHOSCOPY WITH RADIAL ENDOBRONCHIAL ULTRASOUND;  Surgeon: Leslye Peer, MD;  Location: [facility] ENDOSCOPY;  Service: Pulmonary;;     Social History:   reports that she has never smoked. She has never used smokeless tobacco. She reports that she does not drink alcohol and does not use drugs.   Family History:  Her family history includes Arthritis in her mother; Breast cancer (age of onset: 73) in her niece; Breast cancer (age of onset: 42) in her sister; Dementia in her sister; Diabetes in her brother and sister; Hyperlipidemia in her sister; Hypertension in her brother and sister; Stroke in her sister.   Allergies No Known Allergies   Home Medications  Prior to Admission medications   Medication Sig Start Date End Date Taking? Authorizing Provider  acetaminophen (TYLENOL) 500 MG tablet Take 1,000 mg by mouth every 6 (six) hours as needed (for back pain).   Yes [provider]  apixaban  (ELIQUIS) 5 MG TABS tablet Take 1 tablet (5 mg total) by mouth 2 (two) times daily. 10 mg twice daily for 4 days, then on 7/28 switch to 5 mg twice daily 08/09/22  Yes Gherghe, Daylene Katayama, MD  atenolol (TENORMIN) 25 MG tablet Take 0.5 tablets (12.5 mg total) by mouth daily. 07/27/22  Yes [name], FNP  atorvastatin (LIPITOR) 80 MG tablet Take 1 tablet (80 mg total) by mouth daily. Patient taking differently: Take 80 mg by mouth at bedtime. 10/08/21  Yes [name], FNP  Cholecalciferol (VITAMIN D3) 125 MCG (5000 UT) CAPS Take 5,000 Units by mouth daily.   Yes [provider]  dexamethasone (DECADRON) 4 MG tablet Take 1 tablet (4 mg total) by mouth 2 (two) times daily. 08/09/22  Yes Leatha Gilding, MD  hydrOXYzine (ATARAX) 10 MG tablet Take 10 mg by mouth 2 (two) times daily as needed for itching.   Yes [provider]  ketoconazole (NIZORAL) 2 % cream Apply 1 Application topically See admin instructions. Apply to both feet 2 times a day 03/15/22  Yes [provider]  levETIRAcetam (KEPPRA) 500 MG tablet Take 1 tablet (500 mg total) by mouth 2 (two) times daily. 06/12/22 09/06/2022 Yes Loetta Rough, MD  lidocaine 4 % Place 2 patches onto the skin daily.   Yes [provider]  metformin (FORTAMET) 500 MG (OSM) 24 hr tablet Take 500 mg by mouth daily.   Yes [provider]  methocarbamol (ROBAXIN) 500 MG tablet Take 250 mg by mouth in the morning and at bedtime.   Yes [provider]  oxyCODONE (OXY IR/ROXICODONE) 5 MG immediate release tablet Take 1-2 tablets (5-10 mg total) by mouth every 4 (four) hours as needed for moderate pain. Patient taking differently: Take 5 mg by mouth in the morning, at noon, and at bedtime. 08/09/22  Yes Gherghe, Daylene Katayama, MD  pantoprazole (PROTONIX) 40 MG tablet Take 1 tablet (40 mg total) by mouth daily. 08/10/22  Yes Gherghe, Daylene Katayama, MD  QUEtiapine (SEROQUEL) 25 MG tablet Take 0.5 tablets (12.5 mg total) by  mouth 2 (two) times daily. Patient taking differently: Take 25 mg by mouth  2 (two) times daily. 08/09/22  Yes Leatha Gilding, MD  traZODone (DESYREL) 50 MG tablet Take 50 mg by mouth at bedtime.   Yes [provider]  Accu-Chek Softclix Lancets lancets Use to check blood sugar as directed 4 times a day 02/14/22   Pickenpack-Cousar, Arty Baumgartner, NP  blood glucose meter kit and supplies KIT Dispense based on patient and insurance preference. Use up to four times daily as directed. 08/31/21   Loa Socks, NP  Blood Glucose Monitoring Suppl (ACCU-CHEK GUIDE) w/Device KIT Use as directed 4 times a day 08/31/21   Loa Socks, NP  glucose blood (ACCU-CHEK GUIDE) test strip Use as directed 4 times a day 01/26/22   Pickenpack-Cousar, Arty Baumgartner, NP  lidocaine-prilocaine (EMLA) cream Apply 1 Application topically as needed [facility] access).    [provider]     Critical care time: 35 min    The patient is critically ill with multiple organ systems failure and requires high complexity decision making for assessment and support, frequent evaluation and titration of therapies, application of advanced monitoring technologies and extensive interpretation of multiple databases.  Independent Critical Care Time: 35 Minutes.   Mechele Collin, M.D. [facility] Pulmonary/Critical Care Medicine 09/03/2022 9:59 AM   Please see Amion for pager number to reach on-call Pulmonary and Critical Care Team.

## 2022-09-18 NOTE — Progress Notes (Signed)
Pharmacy Antibiotic Note  Jennifer Cowan is a 77 y.o. female admitted on 08/18/2022 with covid-19.  Pt found to be tachycardic and tachynpeic, Pharmacy has been consulted to dose vancomycin and cefepime for sepsis.  Plan: Vancomycin 1 gm IV x 1 then 1250mg  q24h (AUC 536.6, Scr used 0.8) Cefepime 2gm IV q12h Follow renal function and clinical course  Weight: 61.5 kg (135 lb 9.3 oz)  Temp (24hrs), Avg:99.1 F (37.3 C), Min:98 F (36.7 C), Max:100.4 F (38 C)  Recent Labs  Lab 09/09/2022 1412 09/09/2022 1542  WBC 5.1  --   CREATININE  --  0.46    Estimated Creatinine Clearance: 58.1 mL/min (by C-G formula based on SCr of 0.46 mg/dL).    No Known Allergies  Antimicrobials this admission: 8/25 vanc >> 8/25 cefepime >>  Dose adjustments this admission:   Microbiology results: 8/24 BCx:    Thank you for allowing pharmacy to be a part of this patient's care.  Arley Phenix RPh 08/28/2022, 1:50 AM

## 2022-09-18 NOTE — Progress Notes (Signed)
Pt asystole on monitor and confirmed in two leads.  No breath sounds auscultated for full minute.  No heart sounds auscultated for full minute.   Confirmed death by Gayland Curry RN & Maurine Cane RN.  Time of death 1306 Sep 23, 2022.

## 2022-09-18 NOTE — Progress Notes (Signed)
   09/06/2022 0316  BiPAP/CPAP/SIPAP  BiPAP/CPAP/SIPAP Pt Type Adult  BiPAP/CPAP/SIPAP V60  Mask Type Full face mask  Mask Size Medium  Set Rate (S)  17 breaths/min  Respiratory Rate 32 breaths/min  IPAP 18 cmH20  EPAP 8 cmH2O  FiO2 (%) 80 %  Minute Ventilation 17  Leak 0  Peak Inspiratory Pressure (PIP) 16  Tidal Volume (Vt) 581  Patient Home Equipment No  Auto Titrate No  Press High Alarm 35 cmH2O  Press Low Alarm 5 cmH2O  BiPAP/CPAP /SiPAP Vitals  Pulse Rate (!) 138  Resp (!) 32  BP (!) 82/47  SpO2 97 %  Bilateral Breath Sounds Diminished;Expiratory wheezes  MEWS Score/Color  MEWS Score 7  MEWS Score Color Red

## 2022-09-18 NOTE — Progress Notes (Signed)
Pharmacy Antibiotic Note  Jennifer Cowan is a 77 y.o. female admitted on 08/20/2022 with covid-19.  Pt found to be tachycardic and tachynpeic, Pharmacy has been consulted to dose vancomycin and cefepime for sepsis.  Day 1 Vanc/cefepime/flagyl Worsening SCr  Plan: Due to increase in SCr, change vanc to 1g IV q24 - goal AUC 400-550 Continue Cefepime 2g IV q12h per current renal function Follow renal function and clinical course  Weight: 66 kg (145 lb 8.1 oz)  Temp (24hrs), Avg:99.3 F (37.4 C), Min:98 F (36.7 C), Max:101.2 F (38.4 C)  Recent Labs  Lab 08/23/2022 1412 08/30/2022 1542 09/16/2022 0139 08/30/2022 0427  WBC 5.1  --   --  4.5  CREATININE  --  0.46  --  1.09*  LATICACIDVEN  --   --  5.0* 3.8*    Estimated Creatinine Clearance: 44.3 mL/min (A) (by C-G formula based on SCr of 1.09 mg/dL (H)).    No Known Allergies  Antimicrobials this admission: 8/25 vanc >> 8/25 cefepime >> 8/25 flagyl >>  8/24 remdesivir >>   Dose adjustments this admission:   Microbiology results: 8/24 BCx: 1/2 bottles GPR   Thank you for allowing pharmacy to be a part of this patient's care.  Hessie Knows, PharmD, BCPS Secure Chat if ?s 08/18/2022 10:08 AM

## 2022-09-18 NOTE — Progress Notes (Signed)
Pt found to be tachycardic and tachypneic. Pt transferred to SD due to an increased need of O2 and placed on BIPAP. per [facility], NP.  See orders.

## 2022-09-18 DEATH — deceased

## 2022-09-26 ENCOUNTER — Ambulatory Visit: Payer: 59

## 2022-09-26 ENCOUNTER — Other Ambulatory Visit: Payer: 59

## 2022-09-26 ENCOUNTER — Ambulatory Visit: Payer: 59 | Admitting: Hematology and Oncology

## 2022-10-03 ENCOUNTER — Ambulatory Visit: Payer: 59 | Admitting: Adult Health

## 2022-10-03 ENCOUNTER — Other Ambulatory Visit: Payer: 59

## 2022-10-03 ENCOUNTER — Ambulatory Visit: Payer: 59

## 2022-10-05 ENCOUNTER — Other Ambulatory Visit: Payer: 59

## 2022-10-10 ENCOUNTER — Ambulatory Visit: Payer: 59 | Admitting: Internal Medicine

## 2022-10-12 ENCOUNTER — Encounter: Payer: 59 | Admitting: Family Medicine

## 2023-01-25 IMAGING — MG MM DIGITAL SCREENING UNILAT*R* W/ TOMO W/ CAD
6 series · 6 of 18 positions shown · non-contrast
Comparison: None.

ACR Breast Density Category a: The breast tissue is almost entirely
fatty.

CLINICAL DATA: Screening.

EXAM:
DIGITAL SCREENING UNILATERAL RIGHT MAMMOGRAM WITH CAD AND
TOMOSYNTHESIS
TECHNIQUE: Right screening digital craniocaudal and mediolateral oblique
mammograms were obtained. Right screening digital breast
tomosynthesis was performed. The images were evaluated with
computer-aided detection.

[R CC synth-2D]
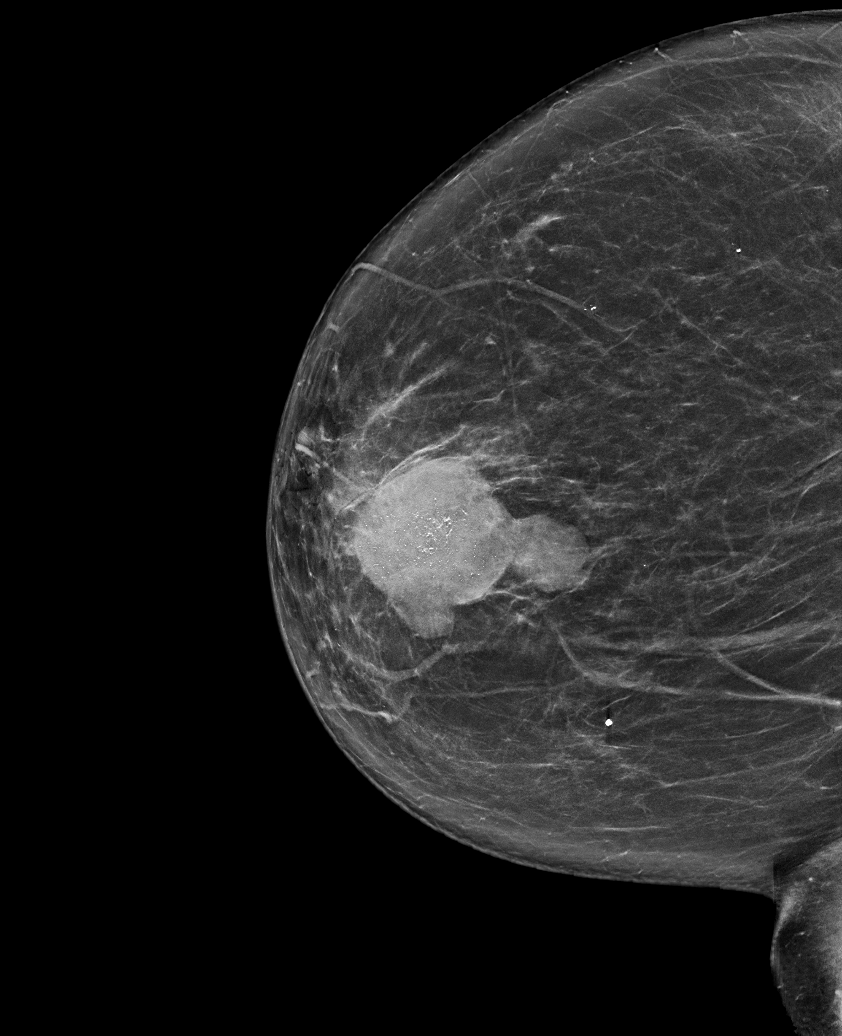

[R MLO synth-2D (1 of 2)]
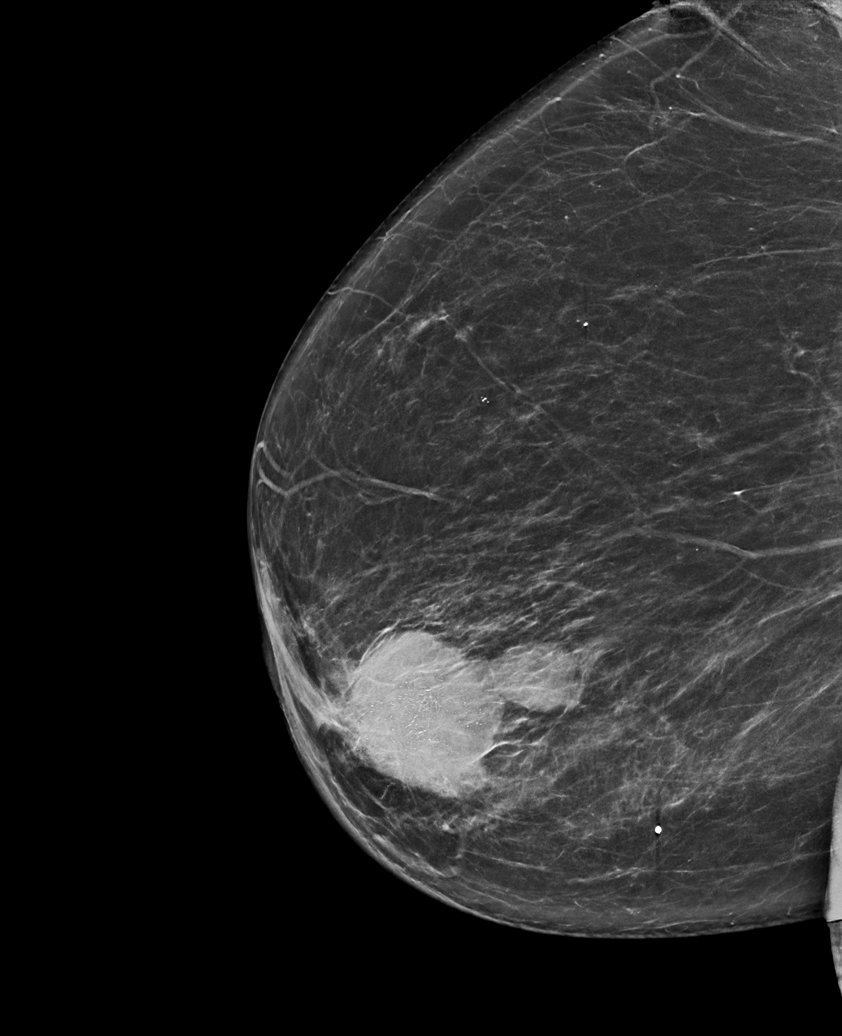

[R MLO synth-2D (2 of 2)]
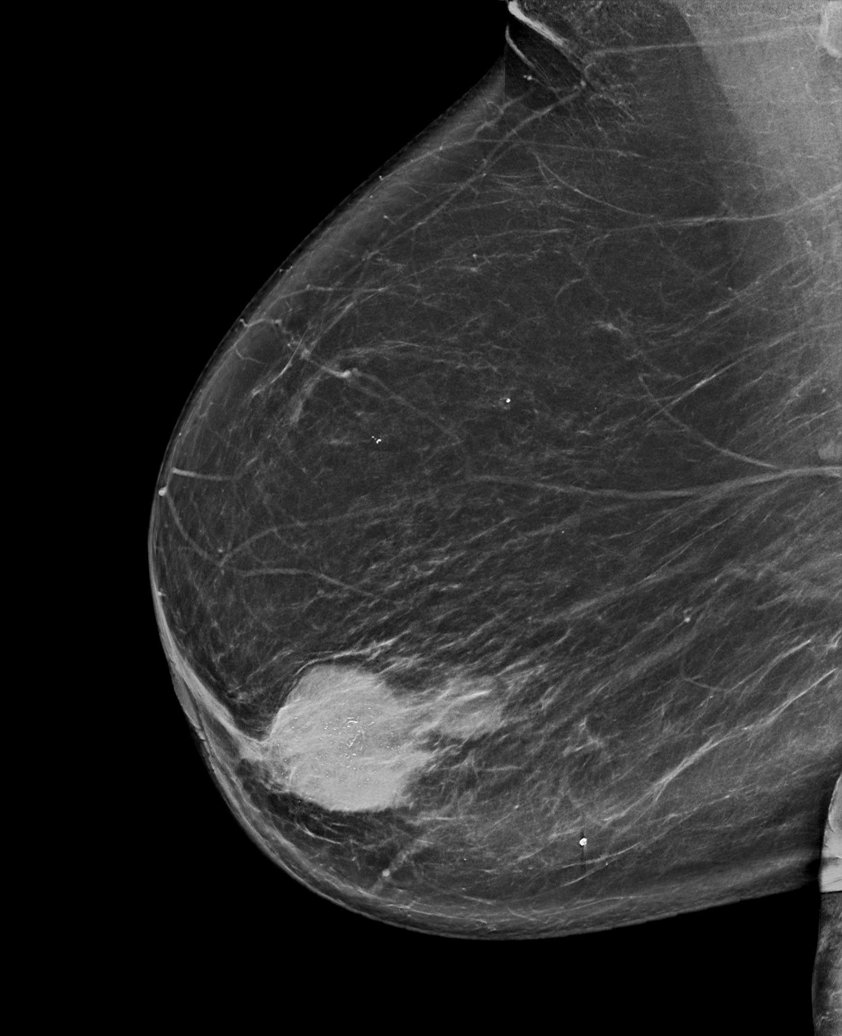

[R MLO tomo (1 of 2) · tomo slice 43/85.0]
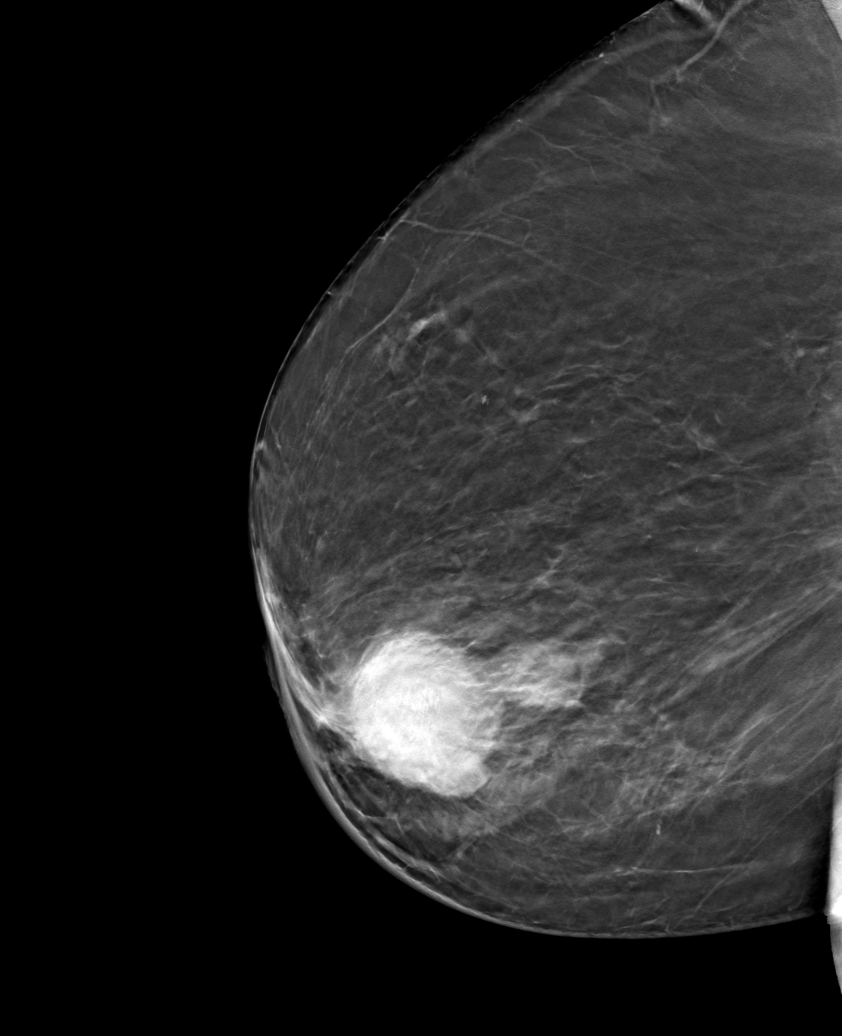

[R MLO tomo (2 of 2) · tomo slice 47/93.0]
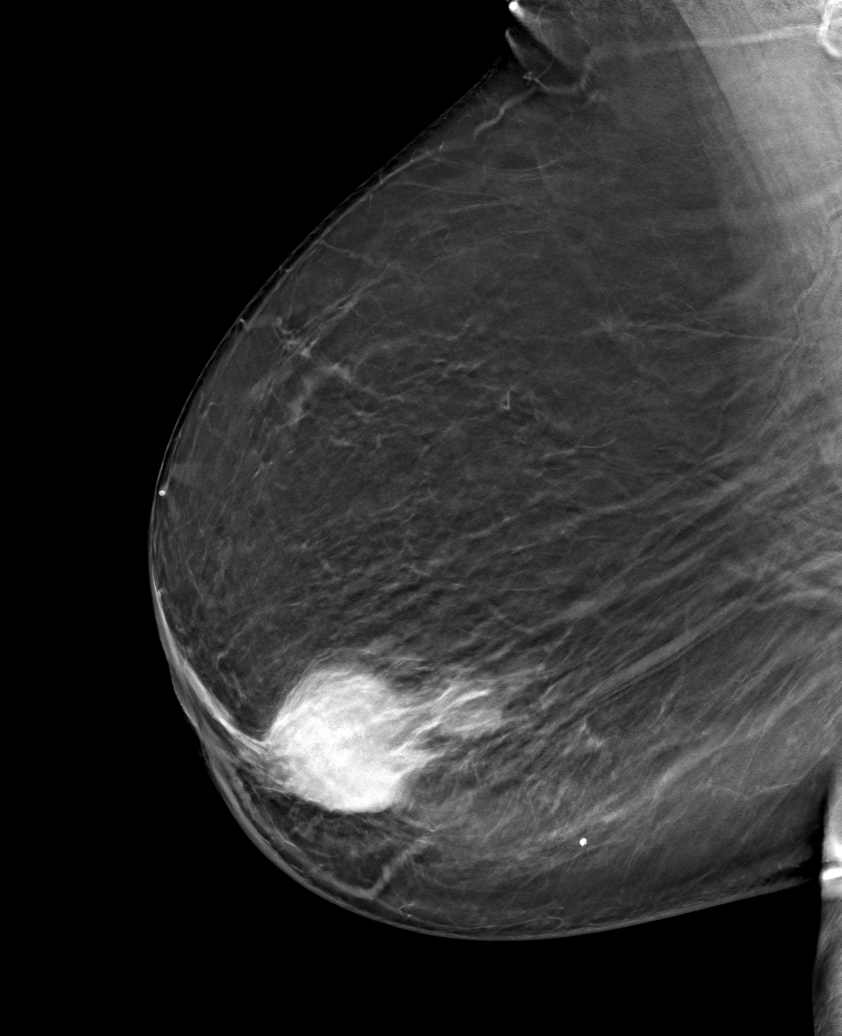

[R CC tomo · tomo slice 43/86.0]
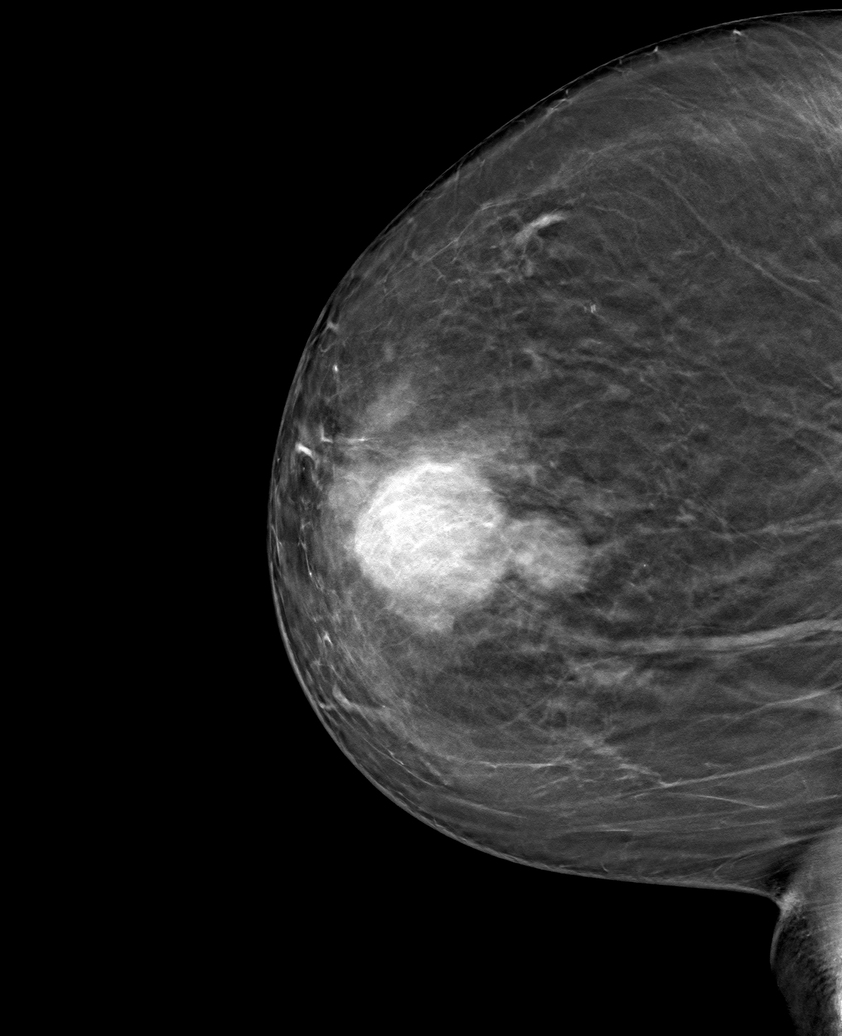

[6 of 18 positions shown; findings below may reference images not displayed]

FINDINGS: In the right breast, a mass with calcifications warrants further
evaluation. In the left breast, no findings suspicious for
malignancy.
IMPRESSION: Further evaluation is suggested for a mass with calcifications in
the right breast.

RECOMMENDATION:
Diagnostic mammogram and possibly ultrasound of the right breast.
(Code:[id number])

The patient will be contacted regarding the findings, and additional
imaging will be scheduled.

BI-RADS CATEGORY  0: Incomplete. Need additional imaging evaluation
and/or prior mammograms for comparison.

## 2023-11-09 ENCOUNTER — Other Ambulatory Visit (HOSPITAL_BASED_OUTPATIENT_CLINIC_OR_DEPARTMENT_OTHER): Payer: Self-pay
